# Patient Record
Sex: Female | Born: 1935 | Race: White | Hispanic: No | Smoking: Former smoker
Health system: Southern US, Community
[De-identification: ages and names within clinical notes are randomized; demographics above are authoritative.]

## PROBLEM LIST (undated history)

## (undated) DIAGNOSIS — I10 Essential (primary) hypertension: Secondary | ICD-10-CM

## (undated) DIAGNOSIS — K922 Gastrointestinal hemorrhage, unspecified: Secondary | ICD-10-CM

## (undated) DIAGNOSIS — I350 Nonrheumatic aortic (valve) stenosis: Secondary | ICD-10-CM

## (undated) DIAGNOSIS — I340 Nonrheumatic mitral (valve) insufficiency: Secondary | ICD-10-CM

## (undated) DIAGNOSIS — E041 Nontoxic single thyroid nodule: Secondary | ICD-10-CM

## (undated) DIAGNOSIS — M1990 Unspecified osteoarthritis, unspecified site: Secondary | ICD-10-CM

## (undated) DIAGNOSIS — G43909 Migraine, unspecified, not intractable, without status migrainosus: Secondary | ICD-10-CM

## (undated) HISTORY — DX: Migraine, unspecified, not intractable, without status migrainosus: G43.909

## (undated) HISTORY — DX: Gastrointestinal hemorrhage, unspecified: K92.2

## (undated) HISTORY — DX: Essential (primary) hypertension: I10

## (undated) HISTORY — PX: THUMB ARTHROSCOPY: SHX2509

## (undated) HISTORY — DX: Unspecified osteoarthritis, unspecified site: M19.90

## (undated) HISTORY — DX: Nonrheumatic mitral (valve) insufficiency: I34.0

## (undated) HISTORY — DX: Nonrheumatic aortic (valve) stenosis: I35.0

## (undated) HISTORY — PX: TONSILLECTOMY: SUR1361

## (undated) HISTORY — DX: Nontoxic single thyroid nodule: E04.1

---

## 1988-01-16 HISTORY — PX: CATARACT EXTRACTION: SUR2

## 1999-11-07 ENCOUNTER — Encounter: Admission: RE | Admit: 1999-11-07 | Discharge: 1999-11-07 | Payer: Self-pay | Admitting: Endocrinology

## 1999-11-07 ENCOUNTER — Encounter: Payer: Self-pay | Admitting: Endocrinology

## 2001-05-07 ENCOUNTER — Encounter: Admission: RE | Admit: 2001-05-07 | Discharge: 2001-05-07 | Payer: Self-pay | Admitting: Endocrinology

## 2001-05-07 ENCOUNTER — Encounter: Payer: Self-pay | Admitting: Endocrinology

## 2002-04-15 ENCOUNTER — Encounter: Payer: Self-pay | Admitting: Endocrinology

## 2002-04-15 ENCOUNTER — Encounter: Admission: RE | Admit: 2002-04-15 | Discharge: 2002-04-15 | Payer: Self-pay | Admitting: Endocrinology

## 2002-06-15 ENCOUNTER — Encounter: Payer: Self-pay | Admitting: Endocrinology

## 2002-06-15 ENCOUNTER — Encounter: Admission: RE | Admit: 2002-06-15 | Discharge: 2002-06-15 | Payer: Self-pay | Admitting: Endocrinology

## 2003-01-16 HISTORY — PX: CATARACT EXTRACTION: SUR2

## 2003-06-22 ENCOUNTER — Ambulatory Visit (HOSPITAL_COMMUNITY): Admission: RE | Admit: 2003-06-22 | Discharge: 2003-06-22 | Payer: Self-pay | Admitting: Endocrinology

## 2004-06-22 ENCOUNTER — Ambulatory Visit (HOSPITAL_COMMUNITY): Admission: RE | Admit: 2004-06-22 | Discharge: 2004-06-22 | Payer: Self-pay | Admitting: Endocrinology

## 2005-06-27 ENCOUNTER — Ambulatory Visit (HOSPITAL_COMMUNITY): Admission: RE | Admit: 2005-06-27 | Discharge: 2005-06-27 | Payer: Self-pay | Admitting: Endocrinology

## 2006-01-15 HISTORY — PX: REVISION TOTAL KNEE ARTHROPLASTY: SHX767

## 2006-07-02 ENCOUNTER — Ambulatory Visit (HOSPITAL_COMMUNITY): Admission: RE | Admit: 2006-07-02 | Discharge: 2006-07-02 | Payer: Self-pay | Admitting: Endocrinology

## 2007-07-03 ENCOUNTER — Ambulatory Visit (HOSPITAL_COMMUNITY): Admission: RE | Admit: 2007-07-03 | Discharge: 2007-07-03 | Payer: Self-pay | Admitting: Endocrinology

## 2008-07-06 ENCOUNTER — Ambulatory Visit (HOSPITAL_COMMUNITY): Admission: RE | Admit: 2008-07-06 | Discharge: 2008-07-06 | Payer: Self-pay | Admitting: Endocrinology

## 2009-07-19 ENCOUNTER — Ambulatory Visit (HOSPITAL_COMMUNITY): Admission: RE | Admit: 2009-07-19 | Discharge: 2009-07-19 | Payer: Self-pay | Admitting: Endocrinology

## 2010-07-13 ENCOUNTER — Other Ambulatory Visit: Payer: Self-pay | Admitting: Internal Medicine

## 2010-07-13 DIAGNOSIS — Z1231 Encounter for screening mammogram for malignant neoplasm of breast: Secondary | ICD-10-CM

## 2010-07-27 ENCOUNTER — Ambulatory Visit (HOSPITAL_COMMUNITY)
Admission: RE | Admit: 2010-07-27 | Discharge: 2010-07-27 | Disposition: A | Discharge status: Home / Self Care | Payer: Medicare Other | Source: Ambulatory Visit | Attending: Internal Medicine | Admitting: Internal Medicine

## 2010-07-27 DIAGNOSIS — Z1231 Encounter for screening mammogram for malignant neoplasm of breast: Secondary | ICD-10-CM | POA: Insufficient documentation

## 2010-08-01 ENCOUNTER — Other Ambulatory Visit: Payer: Self-pay | Admitting: Internal Medicine

## 2010-08-01 DIAGNOSIS — R928 Other abnormal and inconclusive findings on diagnostic imaging of breast: Secondary | ICD-10-CM

## 2010-08-02 ENCOUNTER — Ambulatory Visit
Admission: RE | Admit: 2010-08-02 | Discharge: 2010-08-02 | Disposition: A | Discharge status: Home / Self Care | Payer: Medicare Other | Source: Ambulatory Visit | Attending: Internal Medicine | Admitting: Internal Medicine

## 2010-08-02 DIAGNOSIS — R928 Other abnormal and inconclusive findings on diagnostic imaging of breast: Secondary | ICD-10-CM

## 2011-06-27 ENCOUNTER — Other Ambulatory Visit: Payer: Self-pay | Admitting: Internal Medicine

## 2011-06-27 DIAGNOSIS — Z1231 Encounter for screening mammogram for malignant neoplasm of breast: Secondary | ICD-10-CM

## 2011-07-30 ENCOUNTER — Ambulatory Visit (HOSPITAL_COMMUNITY)
Admission: RE | Admit: 2011-07-30 | Discharge: 2011-07-30 | Disposition: A | Discharge status: Home / Self Care | Payer: Medicare Other | Source: Ambulatory Visit | Attending: Internal Medicine | Admitting: Internal Medicine

## 2011-07-30 DIAGNOSIS — Z1231 Encounter for screening mammogram for malignant neoplasm of breast: Secondary | ICD-10-CM

## 2012-01-28 ENCOUNTER — Other Ambulatory Visit: Payer: Self-pay | Admitting: Internal Medicine

## 2012-01-28 ENCOUNTER — Ambulatory Visit
Admission: RE | Admit: 2012-01-28 | Discharge: 2012-01-28 | Disposition: A | Discharge status: Home / Self Care | Payer: Medicare PPO | Source: Ambulatory Visit | Attending: Internal Medicine | Admitting: Internal Medicine

## 2012-01-28 DIAGNOSIS — R059 Cough, unspecified: Secondary | ICD-10-CM

## 2012-01-28 DIAGNOSIS — R05 Cough: Secondary | ICD-10-CM

## 2012-01-28 IMAGING — CR DG CHEST 2V
2 series · 2 of 2 positions shown · non-contrast
Comparison: None.

CLINICAL DATA: Cough for 6 months

CHEST - 2 VIEW

[view not recorded (1 of 2)]
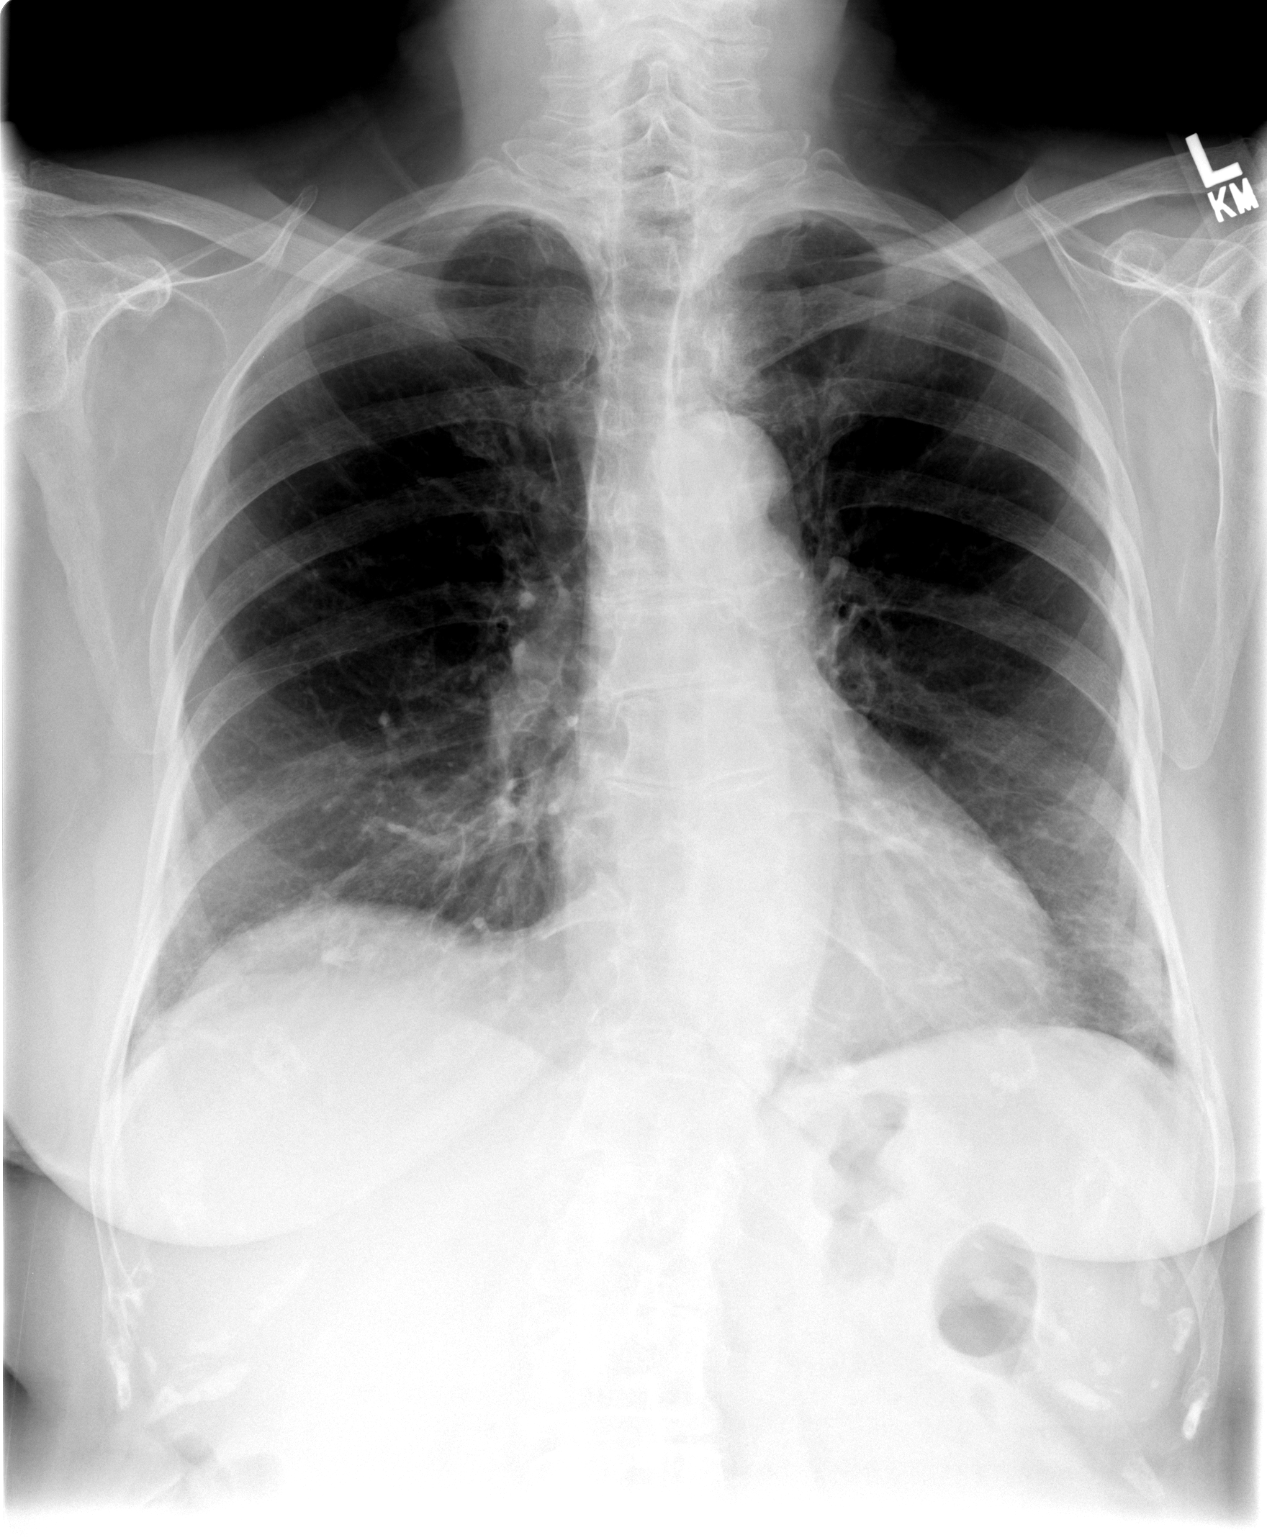

[view not recorded (2 of 2)]
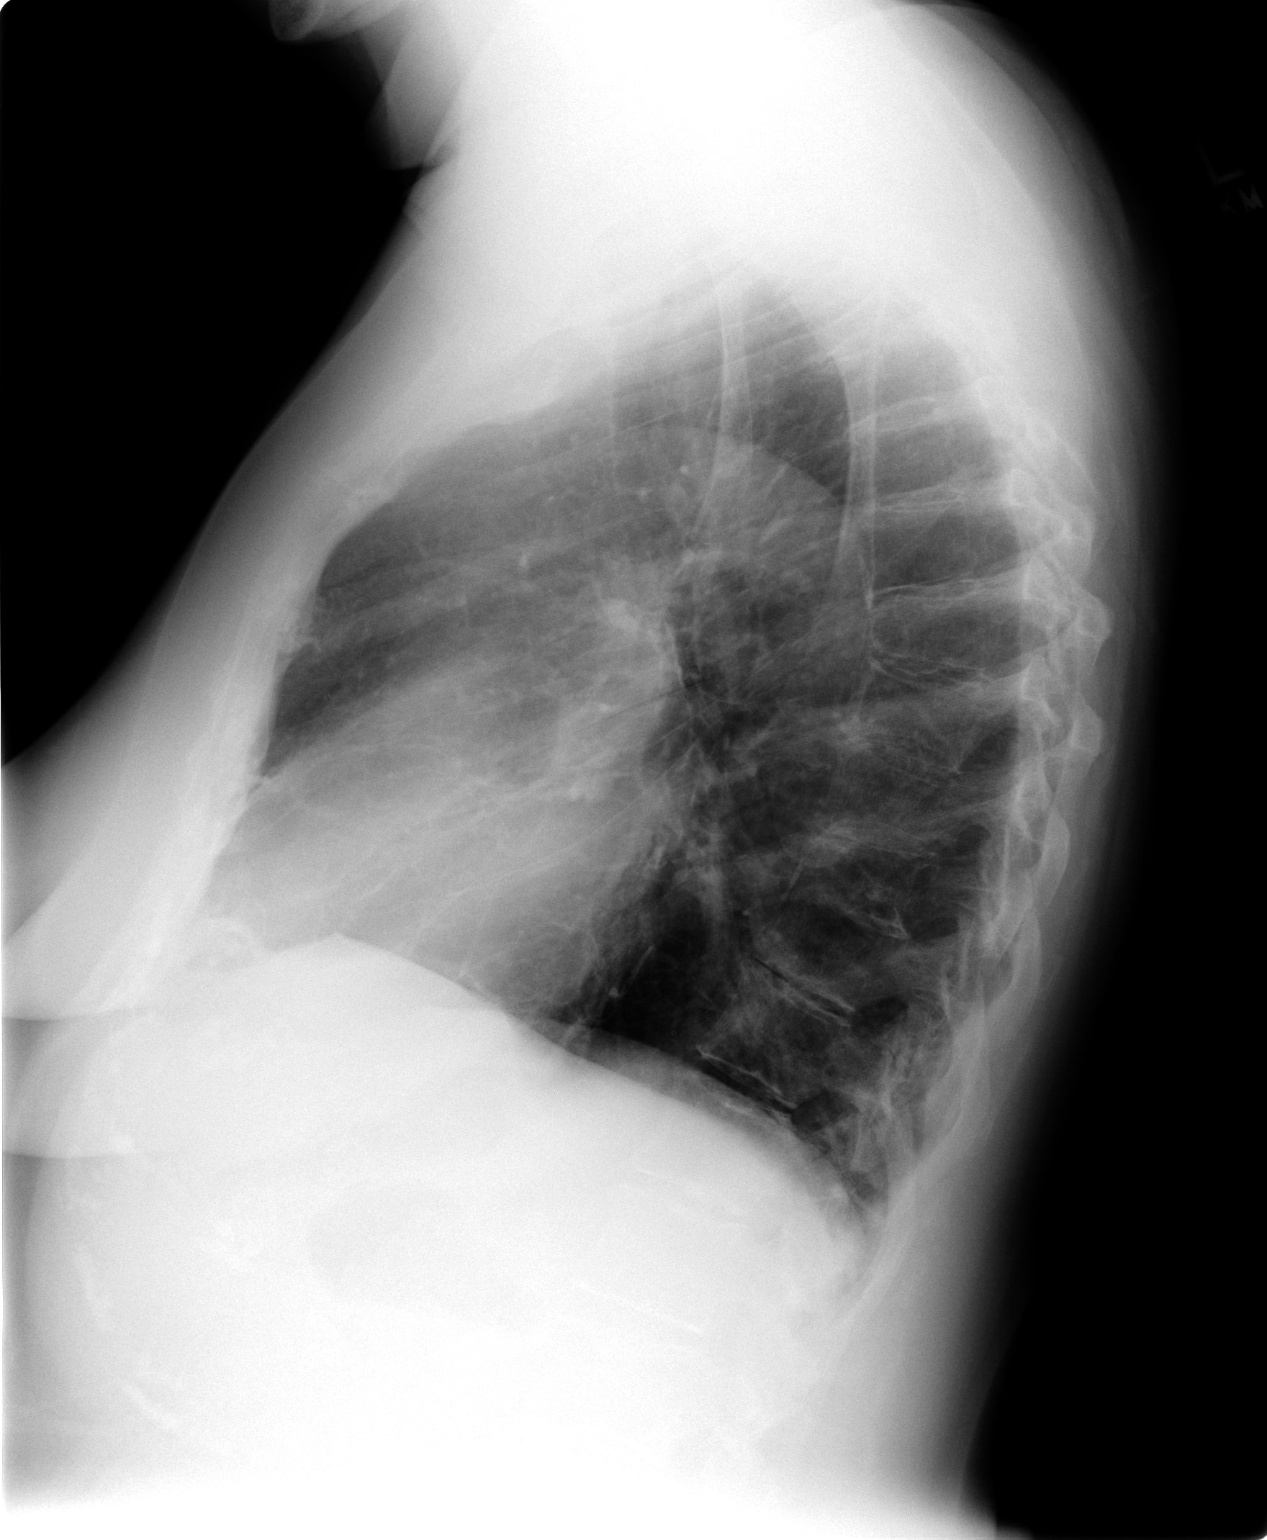

[2 of 2 positions shown; findings below may reference images not displayed]

FINDINGS: There appears be mild basilar fibrosis present, but no
focal infiltrate or effusion is seen.  Mediastinal contours are
normal.  The heart is within upper limits of normal for age.  No
acute bony abnormality is seen.
IMPRESSION: No active lung disease.  Probable mild basilar fibrosis left
greater than right.

## 2012-01-30 ENCOUNTER — Encounter: Payer: Self-pay | Admitting: Internal Medicine

## 2012-01-31 ENCOUNTER — Ambulatory Visit (INDEPENDENT_AMBULATORY_CARE_PROVIDER_SITE_OTHER): Payer: Medicare PPO | Admitting: Internal Medicine

## 2012-01-31 ENCOUNTER — Encounter: Payer: Self-pay | Admitting: Internal Medicine

## 2012-01-31 VITALS — BP 144/70 | HR 85 | Temp 98.0°F | Ht 62.5 in | Wt 171.8 lb

## 2012-01-31 DIAGNOSIS — R05 Cough: Secondary | ICD-10-CM

## 2012-01-31 DIAGNOSIS — R918 Other nonspecific abnormal finding of lung field: Secondary | ICD-10-CM

## 2012-01-31 DIAGNOSIS — R059 Cough, unspecified: Secondary | ICD-10-CM | POA: Insufficient documentation

## 2012-01-31 DIAGNOSIS — R9389 Abnormal findings on diagnostic imaging of other specified body structures: Secondary | ICD-10-CM

## 2012-01-31 DIAGNOSIS — I10 Essential (primary) hypertension: Secondary | ICD-10-CM

## 2012-01-31 MED ORDER — LOSARTAN POTASSIUM 50 MG PO TABS: 50.0000 mg | ORAL_TABLET | Freq: Every day | ORAL | Status: DC

## 2012-01-31 NOTE — Assessment & Plan Note (Signed)
ACE inhibitors are problematic in  pts with airway complaints because  even experienced pulmonologists can't always distinguish ace effects from copd/asthma/pnds/ allergies etc.  By themselves they don't actually cause a problem, much like oxygen can't by itself start a fire, but they certainly serve as a powerful catalyst or enhancer for any "fire"  or inflammatory process in the upper airway, be it caused by an ET  tube or more commonly reflux (especially in the obese or pts with known GERD or who are on biphoshonates) or URI's, due to interference with bradykinin clearance.  The effects of acei on bradykinin levels occurs in 100% of pt's on acei (unless they surreptitiously stop the med!) but the classic cough is only reported in 5%.  This leaves 95% of pts on acei's  with a variety of syndromes including no identifiable symptom in most  vs non-specific symptoms that wax and wane depending on what other insult is occuring at the level of the upper airway (like pnds which may be the "spark" here)  Try on cozaar 50 mg daily

## 2012-01-31 NOTE — Progress Notes (Signed)
  Subjective:    Patient ID: Donna [name], female    DOB: August 16, 1927  MRN: [id number]  HPI  53 yowf quit smoking 1950 and no trouble x " sinus drainage" spring and fall  Then cough onset summer 2012 and referred 01/31/2012 to pulmonary clinic by Bufford Spikes for cough and abn cxr    01/31/2012 1st pulmonary eval/ Wert on ACEI cc cough daily gradually worse x 16 months esp when lie down and when changes positions, always dry. No sob at all. No better on zyrtec. No real variation of cough with spring and fall. No itching sneezing or wheezing.   No early am exacerbation  of respiratory  c/o's or need for noct saba. Also denies any obvious fluctuation of symptoms with weather or environmental changes or other aggravating or alleviating factors except as outlined above   Review of Systems  Constitutional: Negative for fever, chills and unexpected weight change.  HENT: Positive for rhinorrhea. Negative for ear pain, nosebleeds, congestion, sore throat, sneezing, trouble swallowing, dental problem, voice change, postnasal drip and sinus pressure.   Eyes: Negative for visual disturbance.  Respiratory: Positive for cough. Negative for choking and shortness of breath.   Cardiovascular: Negative for chest pain and leg swelling.  Gastrointestinal: Negative for vomiting, abdominal pain and diarrhea.  Genitourinary: Negative for difficulty urinating.  Musculoskeletal: Negative for arthralgias.  Skin: Negative for rash.  Neurological: Negative for tremors, syncope and headaches.  Hematological: Does not bruise/bleed easily.       Objective:   Physical Exam  Pleasant amb wf  Wt Readings from Last 3 Encounters:  01/31/12 171 lb 12.8 oz (77.928 kg)    HEENT: nl dentition, turbinates, and orophanx. Nl external ear canals without cough reflex   NECK :  without JVD/Nodes/TM/ nl carotid upstrokes bilaterally   LUNGS: no acc muscle use, clear to A and P bilaterally without cough on insp or exp  maneuvers   CV:  RRR  no s3 or murmur or increase in P2, no edema   ABD:  soft and nontender with nl excursion in the supine position. No bruits or organomegaly, bowel sounds nl  MS:  warm without deformities, calf tenderness, cyanosis or clubbing  SKIN: warm and dry without lesions    NEURO:  alert, approp, no deficits    cxr 01/28/12 No active lung disease. Probable mild basilar fibrosis left  greater than right       Assessment & Plan:

## 2012-01-31 NOTE — Assessment & Plan Note (Signed)
The most common causes of chronic cough in immunocompetent adults include the following: upper airway cough syndrome (UACS), previously referred to as postnasal drip syndrome (PNDS), which is caused by variety of rhinosinus conditions; (2) asthma; (3) GERD; (4) chronic bronchitis from cigarette smoking or other inhaled environmental irritants; (5) nonasthmatic eosinophilic bronchitis; and (6) bronchiectasis.   These conditions, singly or in combination, have accounted for up to 94% of the causes of chronic cough in prospective studies.   Other conditions have constituted no >6% of the causes in prospective studies These have included bronchogenic carcinoma, chronic interstitial pneumonia, sarcoidosis, left ventricular failure, ACEI-induced cough, and aspiration from a condition associated with pharyngeal dysfunction.    Chronic cough is often simultaneously caused by more than one condition. A single cause has been found from 38 to 82% of the time, multiple causes from 18 to 62%. Multiply caused cough has been the result of three diseases up to 42% of the time.       Most likely this is classic  Classic Upper airway cough syndrome, so named because it's frequently impossible to sort out how much is  CR/sinusitis with freq throat clearing (which can be related to primary GERD)   vs  causing  secondary (" extra esophageal")  GERD from wide swings in gastric pressure that occur with throat clearing, often  promoting self use of mint and menthol lozenges that reduce the lower esophageal sphincter tone and exacerbate the problem further in a cyclical fashion.   These are the same pts (now being labeled as having "irritable larynx syndrome" by some cough centers) who not infrequently have a history of having failed to tolerate ace inhibitors,  dry powder inhalers or biphosphonates or report having atypical reflux symptoms that don't respond to standard doses of PPI , and are easily confused as having aecopd or  asthma flares by even experienced allergists/ pulmonologists.   For now try off acei, rx with h1 and even h2 at hs and then regroup in 4 weeks

## 2012-01-31 NOTE — Assessment & Plan Note (Addendum)
cxr reviewed and in the absence of convincing symptoms of ILD ( cough on isp/ limiting sob, which are lacking here) would not pursue any further w/u at this point - at age 79 unlikely to ever impact health even if she does have very mild PF

## 2012-01-31 NOTE — Patient Instructions (Addendum)
Stop lisinopril and replace with cozaar 50 mg one daily  Try chlortrimeton 4mg   at bedtime along with Pepcid 20 mg  if not satisfied with Zyrtec to eliminate the drainage   Avoid  late meals, excessive alcohol, smoking cessation, and avoid fatty foods, chocolate, peppermint, colas, red wine, and acidic juices such as orange juice.  NO MINT OR MENTHOL PRODUCTS SO NO COUGH DROPS  USE SUGARLESS CANDY INSTEAD (jolley ranchers or Stover's)  NO OIL BASED VITAMINS - use powdered substitutes.     If you are satisfied with your treatment plan let your doctor know and he/she can either refill your medications or you can return here when your prescription runs out.     If in any way you are not 100% satisfied,  please tell us.  If 100% better, tell your friends!

## 2012-05-22 ENCOUNTER — Other Ambulatory Visit: Payer: Self-pay | Admitting: *Deleted

## 2012-05-22 DIAGNOSIS — I10 Essential (primary) hypertension: Secondary | ICD-10-CM

## 2012-05-22 DIAGNOSIS — Z0000 Encounter for general adult medical examination without abnormal findings: Secondary | ICD-10-CM

## 2012-05-22 DIAGNOSIS — M1990 Unspecified osteoarthritis, unspecified site: Secondary | ICD-10-CM

## 2012-05-22 DIAGNOSIS — E041 Nontoxic single thyroid nodule: Secondary | ICD-10-CM

## 2012-06-25 ENCOUNTER — Other Ambulatory Visit: Payer: Self-pay | Admitting: Internal Medicine

## 2012-06-25 DIAGNOSIS — Z1231 Encounter for screening mammogram for malignant neoplasm of breast: Secondary | ICD-10-CM

## 2012-07-24 ENCOUNTER — Other Ambulatory Visit: Payer: 59

## 2012-07-24 DIAGNOSIS — M1990 Unspecified osteoarthritis, unspecified site: Secondary | ICD-10-CM

## 2012-07-24 DIAGNOSIS — E041 Nontoxic single thyroid nodule: Secondary | ICD-10-CM

## 2012-07-24 DIAGNOSIS — Z0000 Encounter for general adult medical examination without abnormal findings: Secondary | ICD-10-CM

## 2012-07-24 DIAGNOSIS — I10 Essential (primary) hypertension: Secondary | ICD-10-CM

## 2012-07-25 ENCOUNTER — Encounter: Payer: Self-pay | Admitting: *Deleted

## 2012-07-25 LAB — CBC WITH DIFFERENTIAL/PLATELET
Basophils Absolute: 0 10*3/uL (ref 0.0–0.2)
Basos: 0 % (ref 0–3)
Eos: 4 % (ref 0–5)
Eosinophils Absolute: 0.2 10*3/uL (ref 0.0–0.4)
HCT: 41.5 % (ref 34.0–46.6)
Hemoglobin: 14.6 g/dL (ref 11.1–15.9)
Immature Grans (Abs): 0 10*3/uL (ref 0.0–0.1)
Immature Granulocytes: 0 % (ref 0–2)
Lymphocytes Absolute: 1.6 10*3/uL (ref 0.7–3.1)
Lymphs: 30 % (ref 14–46)
MCH: 30.7 pg (ref 26.6–33.0)
MCHC: 35.2 g/dL (ref 31.5–35.7)
MCV: 87 fL (ref 79–97)
Monocytes Absolute: 0.5 10*3/uL (ref 0.1–0.9)
Monocytes: 9 % (ref 4–12)
Neutrophils Absolute: 2.9 10*3/uL (ref 1.4–7.0)
Neutrophils Relative %: 57 % (ref 40–74)
RBC: 4.75 x10E6/uL (ref 3.77–5.28)
RDW: 13.4 % (ref 12.3–15.4)
WBC: 5.1 10*3/uL (ref 3.4–10.8)

## 2012-07-25 LAB — BASIC METABOLIC PANEL
BUN/Creatinine Ratio: 15 (ref 11–26)
BUN: 12 mg/dL (ref 8–27)
CO2: 24 mmol/L (ref 18–29)
Calcium: 9.8 mg/dL (ref 8.6–10.2)
Chloride: 102 mmol/L (ref 97–108)
Creatinine, Ser: 0.8 mg/dL (ref 0.57–1.00)
GFR calc Af Amer: 79 mL/min/{1.73_m2} (ref >59)
GFR calc non Af Amer: 69 mL/min/{1.73_m2} (ref >59)
Glucose: 99 mg/dL (ref 65–99)
Potassium: 4.8 mmol/L (ref 3.5–5.2)
Sodium: 139 mmol/L (ref 134–144)

## 2012-07-25 LAB — TSH: TSH: 3.4 u[IU]/mL (ref 0.450–4.500)

## 2012-07-28 ENCOUNTER — Encounter: Payer: Self-pay | Admitting: Internal Medicine

## 2012-07-28 ENCOUNTER — Ambulatory Visit (INDEPENDENT_AMBULATORY_CARE_PROVIDER_SITE_OTHER): Payer: 59 | Admitting: Internal Medicine

## 2012-07-28 VITALS — BP 150/68 | HR 67 | Temp 97.9°F | Resp 13 | Ht 62.5 in | Wt 175.6 lb

## 2012-07-28 DIAGNOSIS — R61 Generalized hyperhidrosis: Secondary | ICD-10-CM

## 2012-07-28 DIAGNOSIS — L74519 Primary focal hyperhidrosis, unspecified: Secondary | ICD-10-CM

## 2012-07-28 NOTE — Progress Notes (Signed)
Patient ID: Donna [name], female   DOB: [birth year], 79 y.o.   MRN: [id number] Location:  [facility]   No Known Allergies  Chief Complaint  Patient presents with  . Medical Managment of Chronic Issues    HPI: Patient is a 79 y.o. white female seen in the office today for med mgt chronic diseases Labs:  Cbc, bmp, tsh all were normal.  Still has hot flashes at 79.  Is warm all of the time.  Gets intense burning in arms and legs from knees down.  It goes away, then she breaks out in sweats all over.  Burning thing is new probably since winter sometime.    Review of Systems:  Review of Systems  Constitutional: Negative for fever, chills, weight loss, malaise/fatigue and diaphoresis.  Eyes: Negative for blurred vision.  Respiratory: Negative for cough and shortness of breath.   Cardiovascular: Negative for chest pain.  Gastrointestinal: Negative for abdominal pain.  Genitourinary: Negative for dysuria.  Musculoskeletal: Negative for myalgias.  Skin: Negative for rash.  Neurological: Positive for tingling and sensory change. Negative for weakness and headaches.  Endo/Heme/Allergies: Does not bruise/bleed easily.       Hot flashes, burning  Psychiatric/Behavioral: Negative for depression and memory loss.     Past Medical History  Diagnosis Date  . Thyroid cyst   . Migraine   . OA (osteoarthritis)   . Hypertension   . Hemorrhage of gastrointestinal tract, unspecified     Past Surgical History  Procedure Laterality Date  . Cataract extraction  1990    rt  . Cataract extraction  2005    left  . Tonsillectomy    . Revision total knee arthroplasty  2008  . Thumb arthroscopy      Social History:   reports that she quit smoking about 64 years ago. Her smoking use included Cigarettes. She smoked 0.00 packs per day for 2 years. She does not have any smokeless tobacco history on file. She reports that  drinks alcohol. She reports that she does  not use illicit drugs.  Family History  Problem Relation Age of Onset  . CVA Father 58  . Pancreatic cancer Mother   . CVA Sister 58  . Brain cancer Brother   . Arthritis Sister     Medications: Patient's Medications  New Prescriptions   No medications on file  Previous Medications   CALCIUM CARBONATE-VITAMIN D 600-400 MG-UNIT PER TABLET    Take 2 tablets by mouth daily.   CELECOXIB (CELEBREX) 200 MG CAPSULE    Take 200 mg by mouth daily as needed.   CETIRIZINE (ZYRTEC) 10 MG TABLET    Take 10 mg by mouth daily.   LEVOTHYROXINE (SYNTHROID, LEVOTHROID) 100 MCG TABLET    Take 100 mcg by mouth daily.   LOSARTAN (COZAAR) 50 MG TABLET    Take 1 tablet (50 mg total) by mouth daily.   MULTIPLE VITAMINS-MINERALS (ICAPS) CAPS    Take 2 capsules by mouth daily.  Modified Medications   No medications on file  Discontinued Medications   No medications on file   Physical Exam: Filed Vitals:   07/28/12 0947  BP: 150/68  Pulse: 67  Temp: 97.9 F (36.6 C)  TempSrc: Oral  Resp: 13  Height: 5' 2.5" (1.588 m)  Weight: 175 lb 9.6 oz (79.652 kg)  Physical Exam  Constitutional: She is oriented to person, place, and time. She appears well-developed and well-nourished. No distress.  Cardiovascular:  Normal rate, regular rhythm, normal heart sounds and intact distal pulses.   Pulmonary/Chest: Effort normal and breath sounds normal. No respiratory distress.  Abdominal: Soft. Bowel sounds are normal. She exhibits no distension. There is no tenderness.  Musculoskeletal: She exhibits no edema and no tenderness.  Neurological: She is alert and oriented to person, place, and time.  No loss of sensation to light touch or proprioception, normal temp sense  Skin: Skin is warm and dry. No rash noted.  Psychiatric: She has a normal mood and affect.    Labs reviewed: Basic Metabolic Panel:  Recent Labs  40/98/11 0825  NA 139  K 4.8  CL 102  CO2 24  GLUCOSE 99  BUN 12  CREATININE 0.80   CALCIUM 9.8  TSH 3.400  CBC:  Recent Labs  07/24/12 0825  WBC 5.1  NEUTROABS 2.9  HGB 14.6  HCT 41.5  MCV 87  Assessment/Plan 1. Hyperhydrosis disorder - Metanephrines, urine, 24 hour; Future - Catecholamines, fractionated, urine, 24 hour; Future - 5 HIAA, quantitative, urine, 24 hour; Future  Labs/tests ordered:as above Next appt:  prn

## 2012-07-31 ENCOUNTER — Ambulatory Visit (HOSPITAL_COMMUNITY)
Admission: RE | Admit: 2012-07-31 | Discharge: 2012-07-31 | Disposition: A | Discharge status: Home / Self Care | Payer: Medicare PPO | Source: Ambulatory Visit | Attending: Internal Medicine | Admitting: Internal Medicine

## 2012-07-31 ENCOUNTER — Other Ambulatory Visit: Payer: 59

## 2012-07-31 DIAGNOSIS — Z1231 Encounter for screening mammogram for malignant neoplasm of breast: Secondary | ICD-10-CM

## 2012-07-31 DIAGNOSIS — R61 Generalized hyperhidrosis: Secondary | ICD-10-CM

## 2012-08-02 LAB — CATECHOLAMINES, FRACTIONATED, URINE, 24 HOUR
Dopamine , 24H Ur: 125 ug/24 hr (ref 0–510)
Dopamine, Rand Ur: 109 ug/L
Epinephrine, 24H Ur: 1 ug/24 hr (ref 0–20)
Epinephrine, Rand Ur: 1 ug/L
Norepinephrine, 24H Ur: 43 ug/24 hr (ref 0–135)
Norepinephrine, Rand Ur: 37 ug/L

## 2012-08-02 LAB — METANEPHRINES, URINE, 24 HOUR
Metaneph Total, Ur: 32 ug/L
Metanephrines, 24H Ur: 37 ug/24 hr — ABNORMAL LOW (ref 45–290)
Normetanephrine, 24H Ur: 322 ug/24 hr (ref 82–500)
Normetanephrine, Ur: 280 ug/L

## 2012-08-04 LAB — 5 HIAA, QUANTITATIVE, URINE, 24 HOUR
5-HIAA, Ur: 1.9 mg/L
5-HIAA,Quant.,24 Hr Urine: 4.5 mg/24 hr (ref 0.0–14.9)

## 2013-02-09 ENCOUNTER — Other Ambulatory Visit: Payer: Self-pay | Admitting: Internal Medicine

## 2013-02-12 ENCOUNTER — Other Ambulatory Visit: Payer: Self-pay | Admitting: *Deleted

## 2013-02-12 MED ORDER — LOSARTAN POTASSIUM 50 MG PO TABS: 50.0000 mg | ORAL_TABLET | Freq: Every day | ORAL | Status: DC

## 2013-04-16 ENCOUNTER — Other Ambulatory Visit: Payer: Self-pay | Admitting: *Deleted

## 2013-04-16 MED ORDER — LOSARTAN POTASSIUM 50 MG PO TABS: 50.0000 mg | ORAL_TABLET | Freq: Every day | ORAL | Status: DC

## 2013-04-16 NOTE — Telephone Encounter (Signed)
[facility] 

## 2013-05-07 ENCOUNTER — Encounter: Payer: Self-pay | Admitting: Internal Medicine

## 2013-05-07 ENCOUNTER — Ambulatory Visit (INDEPENDENT_AMBULATORY_CARE_PROVIDER_SITE_OTHER): Payer: 59 | Admitting: Internal Medicine

## 2013-05-07 VITALS — BP 126/78 | HR 69 | Resp 10 | Wt 172.6 lb

## 2013-05-07 DIAGNOSIS — M159 Polyosteoarthritis, unspecified: Secondary | ICD-10-CM | POA: Insufficient documentation

## 2013-05-07 DIAGNOSIS — E041 Nontoxic single thyroid nodule: Secondary | ICD-10-CM | POA: Insufficient documentation

## 2013-05-07 DIAGNOSIS — R61 Generalized hyperhidrosis: Secondary | ICD-10-CM

## 2013-05-07 DIAGNOSIS — Z13220 Encounter for screening for lipoid disorders: Secondary | ICD-10-CM

## 2013-05-07 DIAGNOSIS — E039 Hypothyroidism, unspecified: Secondary | ICD-10-CM | POA: Insufficient documentation

## 2013-05-07 DIAGNOSIS — L74519 Primary focal hyperhidrosis, unspecified: Secondary | ICD-10-CM

## 2013-05-07 DIAGNOSIS — I10 Essential (primary) hypertension: Secondary | ICD-10-CM | POA: Insufficient documentation

## 2013-05-07 DIAGNOSIS — M1990 Unspecified osteoarthritis, unspecified site: Secondary | ICD-10-CM

## 2013-05-07 MED ORDER — LOSARTAN POTASSIUM 50 MG PO TABS: 50.0000 mg | ORAL_TABLET | Freq: Every day | ORAL | Status: DC

## 2013-05-07 MED ORDER — LEVOTHYROXINE SODIUM 100 MCG PO TABS: 100.0000 ug | ORAL_TABLET | Freq: Every day | ORAL | Status: DC

## 2013-05-07 NOTE — Progress Notes (Signed)
Patient ID: Donna [name], female   DOB: 1927/06/11, 80 y.o.   MRN: [id number]   [facility]  [facility]  Code Status: has living will and hcpoa--advised to please bring a copy in July for physical  No Known Allergies  Chief Complaint  Patient presents with  . Medication Management    Refill medication     HPI: Patient is a 80 y.o. white female seen in the office today for medication refills.  Continues with episodes of diaphoresis--is the same.  Has no desire to see a specialist and labs have been normal for this.    No new concerns.  Not due for annual until July, but was asked to come for med refills.    Vision doing ok.  Has been seeing Dr. Digby--Dr. Allena KatzPatel now.      Stopped her celebrex b/c she felt her skin was getting worse.  Now uses rare ibuprofen b/c of her bleeding history.  Tylenol does no good for her.    Review of Systems:  Review of Systems  Constitutional: Positive for diaphoresis. Negative for fever and malaise/fatigue.  HENT: Negative for congestion.   Eyes: Negative for blurred vision.  Respiratory: Negative for shortness of breath.   Cardiovascular: Negative for chest pain and leg swelling.  Gastrointestinal: Negative for abdominal pain.  Genitourinary: Negative for dysuria, urgency and frequency.  Musculoskeletal: Positive for joint pain. Negative for falls and myalgias.  Skin: Negative for rash.  Neurological: Negative for loss of consciousness, weakness and headaches.       Has periods of sweating  Psychiatric/Behavioral: Negative for depression and memory loss.    Past Medical History  Diagnosis Date  . Thyroid cyst   . Migraine   . OA (osteoarthritis)   . Hypertension   . Hemorrhage of gastrointestinal tract, unspecified     Past Surgical History  Procedure Laterality Date  . Cataract extraction  1990    rt  . Cataract extraction  2005    left  . Tonsillectomy    . Revision total knee arthroplasty  2008   . Thumb arthroscopy      Social History:   reports that she quit smoking about 65 years ago. Her smoking use included Cigarettes. She smoked 0.00 packs per day for 2 years. She does not have any smokeless tobacco history on file. She reports that she drinks alcohol. She reports that she does not use illicit drugs.  Family History  Problem Relation Age of Onset  . CVA Father [phone]  . Pancreatic cancer Mother   . CVA Sister [phone]  . Brain cancer Brother   . Arthritis Sister     Medications: Patient's Medications  New Prescriptions   No medications on file  Previous Medications   CALCIUM CARBONATE-VITAMIN D 600-400 MG-UNIT PER TABLET    Take 2 tablets by mouth daily.   LEVOTHYROXINE (SYNTHROID, LEVOTHROID) 100 MCG TABLET    Take 100 mcg by mouth daily.   LOSARTAN (COZAAR) 50 MG TABLET    Take 1 tablet (50 mg total) by mouth daily.   MULTIPLE VITAMINS-MINERALS (PRESERVISION AREDS 2) CAPS    Take by mouth. 1 by mouth twice daily  Modified Medications   No medications on file  Discontinued Medications   CELECOXIB (CELEBREX) 200 MG CAPSULE    Take 200 mg by mouth daily as needed.   CETIRIZINE (ZYRTEC) 10 MG TABLET    Take 10 mg by mouth daily.   MULTIPLE VITAMINS-MINERALS (  ICAPS) CAPS    Take 2 capsules by mouth daily.   Physical Exam: Filed Vitals:   05/07/13 1012  BP: 126/78  Pulse: 69  Resp: 10  Weight: 172 lb 9.6 oz (78.291 kg)  SpO2: 95%  Physical Exam  Constitutional: She is oriented to person, place, and time. She appears well-developed and well-nourished. No distress.  Cardiovascular: Normal rate, regular rhythm, normal heart sounds and intact distal pulses.   Has varicose veins legs  Pulmonary/Chest: Effort normal and breath sounds normal. No respiratory distress.  Abdominal: Soft. Bowel sounds are normal. She exhibits no distension and no mass. There is no tenderness.  Musculoskeletal: Normal range of motion. She exhibits no edema.  Neurological: She is alert and oriented  to person, place, and time.  Skin: Skin is warm and dry. There is pallor.  Psychiatric: She has a normal mood and affect.    Labs reviewed: Basic Metabolic Panel:  Recent Labs  16/10/9605/10/14 0825  NA 139  K 4.8  CL 102  CO2 24  GLUCOSE 99  BUN 12  CREATININE 0.80  CALCIUM 9.8  TSH 3.400  CBC:  Recent Labs  07/24/12 0825  WBC 5.1  NEUTROABS 2.9  HGB 14.6  HCT 41.5  MCV 87    Assessment/Plan 1. Hyperhydrosis disorder -etiology unclear, labs previously done and endocrinology referral offered, but refused - CBC With differential/Platelet; Future - Comprehensive metabolic panel; Future  2. Cyst of thyroid - TSH; Future  3. Unspecified hypothyroidism - TSH; Future -cont synthroid  4. Osteoarthrosis, unspecified whether generalized or localized, unspecified site -stable, stopped her celebrex and using rare prn ibuprofen -f/u cmp before her annual exam  5. Essential hypertension, benign -bp at goal with losartan alone  6. Screening, lipid - f/u Lipid panel; Future  Labs/tests ordered:   Orders Placed This Encounter  Procedures  . CBC With differential/Platelet    Standing Status: Future     Number of Occurrences:      Standing Expiration Date: 01/06/2014  . Comprehensive metabolic panel    Standing Status: Future     Number of Occurrences:      Standing Expiration Date: 01/06/2014  . TSH    Standing Status: Future     Number of Occurrences:      Standing Expiration Date: 01/06/2014  . Lipid panel    Standing Status: Future     Number of Occurrences:      Standing Expiration Date: 01/06/2014    Next appt:  July for annual exam with labs

## 2013-05-07 NOTE — Patient Instructions (Signed)
Please bring us a copy of your living will and health care power of attorney for your records. 

## 2013-07-14 ENCOUNTER — Other Ambulatory Visit: Payer: Self-pay | Admitting: Internal Medicine

## 2013-07-14 DIAGNOSIS — Z1231 Encounter for screening mammogram for malignant neoplasm of breast: Secondary | ICD-10-CM

## 2013-08-03 ENCOUNTER — Other Ambulatory Visit: Payer: 59

## 2013-08-03 DIAGNOSIS — Z13220 Encounter for screening for lipoid disorders: Secondary | ICD-10-CM

## 2013-08-03 DIAGNOSIS — E039 Hypothyroidism, unspecified: Secondary | ICD-10-CM

## 2013-08-03 DIAGNOSIS — E041 Nontoxic single thyroid nodule: Secondary | ICD-10-CM

## 2013-08-03 DIAGNOSIS — R61 Generalized hyperhidrosis: Secondary | ICD-10-CM

## 2013-08-04 ENCOUNTER — Ambulatory Visit (HOSPITAL_COMMUNITY)
Admission: RE | Admit: 2013-08-04 | Discharge: 2013-08-04 | Disposition: A | Discharge status: Home / Self Care | Payer: Medicare PPO | Source: Ambulatory Visit | Attending: Internal Medicine | Admitting: Internal Medicine

## 2013-08-04 DIAGNOSIS — Z1231 Encounter for screening mammogram for malignant neoplasm of breast: Secondary | ICD-10-CM | POA: Insufficient documentation

## 2013-08-04 LAB — COMPREHENSIVE METABOLIC PANEL
ALT: 23 IU/L (ref 0–32)
AST: 14 IU/L (ref 0–40)
Albumin/Globulin Ratio: 2.6 — ABNORMAL HIGH (ref 1.1–2.5)
Albumin: 4.2 g/dL (ref 3.5–4.7)
Alkaline Phosphatase: 44 IU/L (ref 39–117)
BUN/Creatinine Ratio: 15 (ref 11–26)
BUN: 12 mg/dL (ref 8–27)
CO2: 22 mmol/L (ref 18–29)
Calcium: 9.8 mg/dL (ref 8.7–10.3)
Chloride: 103 mmol/L (ref 97–108)
Creatinine, Ser: 0.8 mg/dL (ref 0.57–1.00)
GFR calc Af Amer: 79 mL/min/{1.73_m2} (ref >59)
GFR calc non Af Amer: 68 mL/min/{1.73_m2} (ref >59)
Globulin, Total: 1.6 g/dL (ref 1.5–4.5)
Glucose: 99 mg/dL (ref 65–99)
Potassium: 4.7 mmol/L (ref 3.5–5.2)
Sodium: 139 mmol/L (ref 134–144)
Total Bilirubin: 0.4 mg/dL (ref 0.0–1.2)
Total Protein: 5.8 g/dL — ABNORMAL LOW (ref 6.0–8.5)

## 2013-08-04 LAB — CBC WITH DIFFERENTIAL
Basophils Absolute: 0 10*3/uL (ref 0.0–0.2)
Basos: 0 %
Eos: 4 %
Eosinophils Absolute: 0.2 10*3/uL (ref 0.0–0.4)
HCT: 42.2 % (ref 34.0–46.6)
Hemoglobin: 14.7 g/dL (ref 11.1–15.9)
Immature Grans (Abs): 0 10*3/uL (ref 0.0–0.1)
Immature Granulocytes: 0 %
Lymphocytes Absolute: 1.2 10*3/uL (ref 0.7–3.1)
Lymphs: 25 %
MCH: 30.9 pg (ref 26.6–33.0)
MCHC: 34.8 g/dL (ref 31.5–35.7)
MCV: 89 fL (ref 79–97)
Monocytes Absolute: 0.5 10*3/uL (ref 0.1–0.9)
Monocytes: 11 %
Neutrophils Absolute: 2.7 10*3/uL (ref 1.4–7.0)
Neutrophils Relative %: 60 %
Platelets: 180 10*3/uL (ref 150–379)
RBC: 4.75 x10E6/uL (ref 3.77–5.28)
RDW: 13.2 % (ref 12.3–15.4)
WBC: 4.6 10*3/uL (ref 3.4–10.8)

## 2013-08-04 LAB — LIPID PANEL
Chol/HDL Ratio: 3.7 ratio units (ref 0.0–4.4)
Cholesterol, Total: 123 mg/dL (ref 100–199)
HDL: 33 mg/dL — ABNORMAL LOW (ref >39)
LDL Calculated: 73 mg/dL (ref 0–99)
Triglycerides: 84 mg/dL (ref 0–149)
VLDL Cholesterol Cal: 17 mg/dL (ref 5–40)

## 2013-08-04 LAB — TSH: TSH: 2.55 u[IU]/mL (ref 0.450–4.500)

## 2013-08-05 ENCOUNTER — Encounter: Payer: Self-pay | Admitting: *Deleted

## 2013-08-06 ENCOUNTER — Ambulatory Visit (INDEPENDENT_AMBULATORY_CARE_PROVIDER_SITE_OTHER): Payer: 59 | Admitting: Internal Medicine

## 2013-08-06 ENCOUNTER — Encounter: Payer: Self-pay | Admitting: Internal Medicine

## 2013-08-06 VITALS — BP 142/80 | HR 69 | Temp 98.2°F | Resp 10 | Ht 62.5 in | Wt 175.2 lb

## 2013-08-06 DIAGNOSIS — I10 Essential (primary) hypertension: Secondary | ICD-10-CM

## 2013-08-06 DIAGNOSIS — E039 Hypothyroidism, unspecified: Secondary | ICD-10-CM

## 2013-08-06 DIAGNOSIS — Z23 Encounter for immunization: Secondary | ICD-10-CM

## 2013-08-06 DIAGNOSIS — R61 Generalized hyperhidrosis: Secondary | ICD-10-CM

## 2013-08-06 DIAGNOSIS — M1990 Unspecified osteoarthritis, unspecified site: Secondary | ICD-10-CM

## 2013-08-06 DIAGNOSIS — L74519 Primary focal hyperhidrosis, unspecified: Secondary | ICD-10-CM

## 2013-08-06 DIAGNOSIS — Z13220 Encounter for screening for lipoid disorders: Secondary | ICD-10-CM

## 2013-08-06 MED ORDER — TETANUS-DIPHTH-ACELL PERTUSSIS 5-2.5-18.5 LF-MCG/0.5 IM SUSP: 0.5000 mL | Freq: Once | INTRAMUSCULAR | Status: DC

## 2013-08-06 NOTE — Progress Notes (Signed)
Patient ID: Donna [name], female   DOB: 06-19-1927, 80 y.o.   MRN: [id number]   Location:  [facility] [place] [facility]  Code Status: has living will and hcpoa scanned into chart today  No Known Allergies  Chief Complaint  Patient presents with  . Annual Exam    Yearly check-up, no pap, discuss labs (copy printed)     HPI: Patient is a 80 y.o. white female seen in the office today for annual exam.  Has no concerns.  Continues with sweating episodes, hot flashes.    Her husband has dementia.  Remains quite functional--can't remember days of week etc.  Has been stable.    No difficulties with headaches lately.  Blood pressure at goal of <150/90.  No GI bleeding.  Says some arthritis aches and pains.  Has had some left hip discomfort that is improving--thinks she twisted something earlier in the week.  Review of Systems:  Review of Systems  Constitutional: Positive for diaphoresis. Negative for fever, chills and malaise/fatigue.  HENT: Negative for hearing loss.   Eyes: Negative for blurred vision.  Respiratory: Negative for shortness of breath.   Cardiovascular: Negative for chest pain and leg swelling.  Gastrointestinal: Negative for abdominal pain, constipation, blood in stool and melena.  Genitourinary: Negative for dysuria, urgency and frequency.  Musculoskeletal: Positive for joint pain. Negative for falls.  Skin: Negative for rash.  Neurological: Negative for dizziness, focal weakness, weakness and headaches.  Endo/Heme/Allergies: Does not bruise/bleed easily.  Psychiatric/Behavioral: Negative for depression and memory loss. The patient does not have insomnia.        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 Thyroid cyst   . Migraine   . OA (osteoarthritis)   . Hypertension   . Hemorrhage of gastrointestinal tract, unspecified     Past Surgical History  Procedure Laterality Date  . Cataract  extraction  1990    rt  . Cataract extraction  2005    left  . Tonsillectomy    . Revision total knee arthroplasty  2008  . Thumb arthroscopy      Social History:   reports that she quit smoking about 65 years ago. Her smoking use included Cigarettes. She smoked 0.00 packs per day for 2 years. She does not have any smokeless tobacco history on file. She reports that she drinks alcohol. She reports that she does not use illicit drugs.  Family History  Problem Relation Age of Onset  . CVA Father 48  . Pancreatic cancer Mother   . CVA Sister 60  . Brain cancer Brother   . Arthritis Sister     Medications: Patient's Medications  New Prescriptions   No medications on file  Previous Medications   CALCIUM CARBONATE-VITAMIN D 600-400 MG-UNIT PER TABLET    Take 2 tablets by mouth daily.   LEVOTHYROXINE (SYNTHROID, LEVOTHROID) 100 MCG TABLET    Take 1 tablet (100 mcg total) by mouth daily. hypothyroidism   LOSARTAN (COZAAR) 50 MG TABLET    Take 1 tablet (50 mg total) by mouth daily. hypertension   MULTIPLE VITAMINS-MINERALS (PRESERVISION AREDS 2) CAPS    Take by mouth. 1 by mouth twice daily  Modified Medications   Modified Medication Previous Medication   TDAP (BOOSTRIX) 5-2.5-18.5 LF-MCG/0.5 INJECTION Tdap (BOOSTRIX) 5-2.5-18.5 LF-MCG/0.5 injection      Inject 0.5 mLs into the muscle once.    Inject 0.5 mLs into the  muscle once.  Discontinued Medications   No medications on file     Physical Exam: Filed Vitals:   08/06/13 1340  BP: 142/80  Pulse: 69  Temp: 98.2 F (36.8 C)  TempSrc: Oral  Resp: 10  Height: 5' 2.5" (1.588 m)  Weight: 175 lb 3.2 oz (79.47 kg)  SpO2: 95%  Physical Exam  Constitutional: She is oriented to person, place, and time. She appears well-developed and well-nourished. No distress.  HENT:  Head: Normocephalic and atraumatic.  Right Ear: External ear normal.  Left Ear: External ear normal.  Nose: Nose normal.  Mouth/Throat: Oropharynx is clear and  moist. No oropharyngeal exudate.  Eyes: Conjunctivae and EOM are normal. Pupils are equal, round, and reactive to light.  Neck: Normal range of motion. Neck supple. No JVD present. No thyromegaly present.  Cardiovascular: Normal rate, regular rhythm, normal heart sounds and intact distal pulses.   Pulmonary/Chest: Effort normal and breath sounds normal. No respiratory distress. Right breast exhibits no inverted nipple, no mass, no nipple discharge, no skin change and no tenderness. Left breast exhibits no inverted nipple, no mass, no nipple discharge, no skin change and no tenderness.  Abdominal: Soft. Bowel sounds are normal. She exhibits no distension and no mass. There is no tenderness.  Musculoskeletal: Normal range of motion. She exhibits no edema or tenderness.  Lymphadenopathy:    She has no cervical adenopathy.  Neurological: She is alert and oriented to person, place, and time. She has normal reflexes.  Skin: Skin is warm and dry.  Psychiatric: She has a normal mood and affect. Her behavior is normal. Judgment and thought content normal.    Labs reviewed: Basic Metabolic Panel:  Recent Labs  16/10/9605/20/15 0935  NA 139  K 4.7  CL 103  CO2 22  GLUCOSE 99  BUN 12  CREATININE 0.80  CALCIUM 9.8  TSH 2.550   Liver Function Tests:  Recent Labs  08/03/13 0935  AST 14  ALT 23  ALKPHOS 44  BILITOT 0.4  PROT 5.8*   No results found for this basename: LIPASE, AMYLASE,  in the last 8760 hours No results found for this basename: AMMONIA,  in the last 8760 hours CBC:  Recent Labs  08/03/13 0935  WBC 4.6  NEUTROABS 2.7  HGB 14.7  HCT 42.2  MCV 89  PLT 180   Lipid Panel:  Recent Labs  08/03/13 0935  HDL 33*  LDLCALC 73  TRIG 84  CHOLHDL 3.7   Assessment/Plan 1. Essential hypertension, benign -bp goal less than 150/90 with age and no comorbid illnesses- Comprehensive metabolic panel; Future  2. Osteoarthrosis, unspecified whether generalized or localized,  unspecified site -minor arthritis pains, does not even take any medications for this -discussed tylenol if she does need something  3. Screening, lipid - not on meds, discussed healthy diet and exercise - Comprehensive metabolic panel; Future - Lipid panel; Future  4. Unspecified hypothyroidism -cont synthroid, f/u tsh  5. Hyperhydrosis disorder - unclear etiology of this, recommended she see endocrine as I was unable to find cause and it seems unusual for her to have hot flashes at 80 yo when she went through menopause many years ago - CBC With differential/Platelet; Future  6. Need for Tdap vaccination -Rx given to get this at pharmacy  Labs/tests ordered: Orders Placed This Encounter  Procedures  . CBC With differential/Platelet    Standing Status: Future     Number of Occurrences:      Standing Expiration Date: 02/07/2015  .  Comprehensive metabolic panel    Standing Status: Future     Number of Occurrences:      Standing Expiration Date: 02/07/2015  . Lipid panel    Standing Status: Future     Number of Occurrences:      Standing Expiration Date: 02/07/2015  . TSH    Standing Status: Future     Number of Occurrences:      Standing Expiration Date: 02/07/2015    Next appt:  1 year for annual exam with labs before

## 2013-08-06 NOTE — Progress Notes (Signed)
Passed clock drawing 

## 2014-03-22 DIAGNOSIS — H04123 Dry eye syndrome of bilateral lacrimal glands: Secondary | ICD-10-CM | POA: Diagnosis not present

## 2014-03-22 DIAGNOSIS — H3531 Nonexudative age-related macular degeneration: Secondary | ICD-10-CM | POA: Diagnosis not present

## 2014-05-05 DIAGNOSIS — H3531 Nonexudative age-related macular degeneration: Secondary | ICD-10-CM | POA: Diagnosis not present

## 2014-05-05 DIAGNOSIS — H43813 Vitreous degeneration, bilateral: Secondary | ICD-10-CM | POA: Diagnosis not present

## 2014-05-27 ENCOUNTER — Other Ambulatory Visit: Payer: Self-pay | Admitting: *Deleted

## 2014-05-27 DIAGNOSIS — I10 Essential (primary) hypertension: Secondary | ICD-10-CM

## 2014-05-27 DIAGNOSIS — E041 Nontoxic single thyroid nodule: Secondary | ICD-10-CM

## 2014-05-27 DIAGNOSIS — E038 Other specified hypothyroidism: Secondary | ICD-10-CM

## 2014-05-27 MED ORDER — LEVOTHYROXINE SODIUM 100 MCG PO TABS
100.0000 ug | ORAL_TABLET | Freq: Every day | ORAL | Status: DC
Start: 2014-05-27 — End: 2015-01-25

## 2014-05-27 MED ORDER — LOSARTAN POTASSIUM 50 MG PO TABS: 50.0000 mg | ORAL_TABLET | Freq: Every day | ORAL | Status: DC

## 2014-05-27 NOTE — Telephone Encounter (Signed)
Patient Requested to be faxed to pharmacy. 

## 2014-07-05 ENCOUNTER — Other Ambulatory Visit: Payer: Self-pay | Admitting: Internal Medicine

## 2014-07-05 DIAGNOSIS — Z1231 Encounter for screening mammogram for malignant neoplasm of breast: Secondary | ICD-10-CM

## 2014-08-04 ENCOUNTER — Other Ambulatory Visit: Payer: Self-pay

## 2014-08-04 ENCOUNTER — Other Ambulatory Visit: Payer: Medicare PPO

## 2014-08-04 DIAGNOSIS — E039 Hypothyroidism, unspecified: Secondary | ICD-10-CM

## 2014-08-04 DIAGNOSIS — R61 Generalized hyperhidrosis: Secondary | ICD-10-CM

## 2014-08-04 DIAGNOSIS — Z13220 Encounter for screening for lipoid disorders: Secondary | ICD-10-CM

## 2014-08-04 DIAGNOSIS — R350 Frequency of micturition: Secondary | ICD-10-CM

## 2014-08-04 DIAGNOSIS — L74519 Primary focal hyperhidrosis, unspecified: Secondary | ICD-10-CM | POA: Diagnosis not present

## 2014-08-04 DIAGNOSIS — I10 Essential (primary) hypertension: Secondary | ICD-10-CM | POA: Diagnosis not present

## 2014-08-05 LAB — COMPREHENSIVE METABOLIC PANEL
ALT: 20 IU/L (ref 0–32)
AST: 14 IU/L (ref 0–40)
Albumin/Globulin Ratio: 2.5 (ref 1.1–2.5)
Albumin: 4.3 g/dL (ref 3.5–4.7)
Alkaline Phosphatase: 47 IU/L (ref 39–117)
BUN/Creatinine Ratio: 21 (ref 11–26)
BUN: 15 mg/dL (ref 8–27)
Bilirubin Total: 0.4 mg/dL (ref 0.0–1.2)
CO2: 23 mmol/L (ref 18–29)
Calcium: 9.9 mg/dL (ref 8.7–10.3)
Chloride: 101 mmol/L (ref 97–108)
Creatinine, Ser: 0.72 mg/dL (ref 0.57–1.00)
GFR calc Af Amer: 89 mL/min/{1.73_m2} (ref >59)
GFR calc non Af Amer: 77 mL/min/{1.73_m2} (ref >59)
Globulin, Total: 1.7 g/dL (ref 1.5–4.5)
Glucose: 100 mg/dL — ABNORMAL HIGH (ref 65–99)
Potassium: 4.5 mmol/L (ref 3.5–5.2)
Sodium: 140 mmol/L (ref 134–144)
Total Protein: 6 g/dL (ref 6.0–8.5)

## 2014-08-05 LAB — URINALYSIS
Bilirubin, UA: NEGATIVE
Glucose, UA: NEGATIVE
Ketones, UA: NEGATIVE
Nitrite, UA: POSITIVE — AB
Specific Gravity, UA: 1.013 (ref 1.005–1.030)
Urobilinogen, Ur: 0.2 mg/dL (ref 0.2–1.0)
pH, UA: 6.5 (ref 5.0–7.5)

## 2014-08-05 LAB — CBC WITH DIFFERENTIAL
Basophils Absolute: 0 10*3/uL (ref 0.0–0.2)
Basos: 1 %
EOS (ABSOLUTE): 0.3 10*3/uL (ref 0.0–0.4)
Eos: 4 %
Hematocrit: 42.5 % (ref 34.0–46.6)
Hemoglobin: 14.5 g/dL (ref 11.1–15.9)
Immature Grans (Abs): 0 10*3/uL (ref 0.0–0.1)
Immature Granulocytes: 0 %
Lymphocytes Absolute: 1.2 10*3/uL (ref 0.7–3.1)
Lymphs: 19 %
MCH: 30 pg (ref 26.6–33.0)
MCHC: 34.1 g/dL (ref 31.5–35.7)
MCV: 88 fL (ref 79–97)
Monocytes Absolute: 0.6 10*3/uL (ref 0.1–0.9)
Monocytes: 9 %
Neutrophils Absolute: 4.3 10*3/uL (ref 1.4–7.0)
Neutrophils: 67 %
RBC: 4.83 x10E6/uL (ref 3.77–5.28)
RDW: 13.9 % (ref 12.3–15.4)
WBC: 6.3 10*3/uL (ref 3.4–10.8)

## 2014-08-05 LAB — LIPID PANEL
Chol/HDL Ratio: 3.5 ratio units (ref 0.0–4.4)
Cholesterol, Total: 118 mg/dL (ref 100–199)
HDL: 34 mg/dL — ABNORMAL LOW (ref >39)
LDL Calculated: 69 mg/dL (ref 0–99)
Triglycerides: 77 mg/dL (ref 0–149)
VLDL Cholesterol Cal: 15 mg/dL (ref 5–40)

## 2014-08-05 LAB — TSH: TSH: 2.68 u[IU]/mL (ref 0.450–4.500)

## 2014-08-06 ENCOUNTER — Ambulatory Visit (HOSPITAL_COMMUNITY)
Admission: RE | Admit: 2014-08-06 | Discharge: 2014-08-06 | Disposition: A | Discharge status: Home / Self Care | Payer: Medicare PPO | Source: Ambulatory Visit | Attending: Internal Medicine | Admitting: Internal Medicine

## 2014-08-06 ENCOUNTER — Ambulatory Visit (HOSPITAL_COMMUNITY): Payer: Medicare PPO

## 2014-08-06 DIAGNOSIS — Z1231 Encounter for screening mammogram for malignant neoplasm of breast: Secondary | ICD-10-CM | POA: Diagnosis not present

## 2014-08-06 LAB — URINE CULTURE

## 2014-08-10 ENCOUNTER — Other Ambulatory Visit: Payer: Self-pay | Admitting: *Deleted

## 2014-08-10 MED ORDER — CIPROFLOXACIN HCL 500 MG PO TABS: ORAL_TABLET | ORAL | Status: DC

## 2014-08-13 ENCOUNTER — Ambulatory Visit (INDEPENDENT_AMBULATORY_CARE_PROVIDER_SITE_OTHER): Payer: Medicare PPO | Admitting: Internal Medicine

## 2014-08-13 ENCOUNTER — Encounter: Payer: Self-pay | Admitting: Internal Medicine

## 2014-08-13 VITALS — BP 138/86 | HR 76 | Temp 97.6°F | Resp 16 | Ht 63.0 in | Wt 171.0 lb

## 2014-08-13 DIAGNOSIS — Z23 Encounter for immunization: Secondary | ICD-10-CM

## 2014-08-13 DIAGNOSIS — L74519 Primary focal hyperhidrosis, unspecified: Secondary | ICD-10-CM

## 2014-08-13 DIAGNOSIS — Z13220 Encounter for screening for lipoid disorders: Secondary | ICD-10-CM

## 2014-08-13 DIAGNOSIS — E041 Nontoxic single thyroid nodule: Secondary | ICD-10-CM

## 2014-08-13 DIAGNOSIS — I10 Essential (primary) hypertension: Secondary | ICD-10-CM

## 2014-08-13 DIAGNOSIS — R61 Generalized hyperhidrosis: Secondary | ICD-10-CM

## 2014-08-13 NOTE — Progress Notes (Signed)
Passed clock drawing 

## 2014-08-13 NOTE — Progress Notes (Signed)
Patient ID: Donna [name], female   DOB: 28-Mar-1927, 81 y.o.   MRN: [id number]   Location:  [facility]  Code Status: DNR Goals of Care: Advanced Directive information Does patient have an advance directive?: Yes, Type of Advance Directive: Healthcare Power of Attorney  Chief Complaint  Patient presents with  . Annual Exam    Yearly check up, discuss labs (copy printed), EKG, MMSE (29/30, passed clock drawing)  . Medical Management of Chronic Issues    HTN, thyroid, & osteoarthritis     HPI: Patient is a 81 y.o. seen in the office today for medicare annual wellness exam and med mgt of her chronic diseases.    Depression screen [facility] 2/9 08/13/2014 05/07/2013  Decreased Interest 0 0  Down, Depressed, Hopeless 0 0  PHQ - 2 Score 0 0  She has been under more stress in the past month looking after her husband who has been weaker physically.  He has dementia.  They have a church group for caregivers who are dealing with sick family.    Fall Risk  08/13/2014 08/06/2013 05/07/2013  Falls in the past year? No No No    MMSE - Mini Mental State Exam 08/13/2014 08/06/2013  Orientation to time 5 5  Orientation to Place 5 5  Registration 3 3  Attention/ Calculation 5 5  Recall 2 3  Language- name 2 objects 2 2  Language- repeat 1 1  Language- follow 3 step command 3 3  Language- read & follow direction 1 1  Write a sentence 1 1  Copy design 1 1  Total score 29 30   Is independent in all ADLs and IADLs.  Exercise:  Says she does not exercise as much as she should.  Walks when cooler.  Her husband has dementia and she can't leave him much.    Immunizations:  Needs prevnar and pneumovax.  Has had her tdap last year.    Pain:  Has aches and pains in knees.  In winter, left hip was hurting.  Went to her orthopedic surgeon, Dr. Julius Bowels, he xrayed and decided she did not need a hip replacement.  He gave her a steroid shot that worked wonders.  Always has her  pain in her hands and wrists, but tolerates.  Urinary incontinence:  None  Last Tues, got up early in the am to use the restroom, and an hour later and felt like had great pressure to go again.  Could only go a small amt.  Eventually went away, but then contantly felt like she had to go.  Last UTI was 40 years ago and it was very painful.  Just completed cipro for UTI.  Feels better now.    Labs were all normal and reviewed with her.    Her biggest concern is to stay healthy to take care of him as long as she can.    Says she's had bone density twice and they've been normal.  Does not want another test.  I have none of these reports.  She is on calcium with D.  Did agree to prevnar today.    She is seeing and hearing well.  Review of Systems:  Review of Systems  Constitutional: Negative for fever and chills.  HENT: Negative for hearing loss.   Eyes: Negative for blurred vision.  Respiratory: Negative for shortness of breath.   Cardiovascular: Negative for chest pain and leg swelling.  Gastrointestinal: Negative for abdominal pain,  constipation, blood in stool and melena.  Genitourinary: Negative for dysuria, urgency and frequency.  Musculoskeletal: Positive for joint pain. Negative for myalgias and falls.  Skin: Negative for itching and rash.  Neurological: Negative for dizziness, loss of consciousness and headaches.  Endo/Heme/Allergies: Does not bruise/bleed easily.  Psychiatric/Behavioral: Negative for depression and memory loss.       Stress looking after her husband    Past Medical History  Diagnosis Date  . Thyroid cyst   . Migraine   . OA (osteoarthritis)   . Hypertension   . Hemorrhage of gastrointestinal tract, unspecified     Past Surgical History  Procedure Laterality Date  . Cataract extraction  1990    rt  . Cataract extraction  2005    left  . Tonsillectomy    . Revision total knee arthroplasty  2008  . Thumb arthroscopy      No Known  Allergies Medications: Patient's Medications  New Prescriptions   No medications on file  Previous Medications   CALCIUM CARBONATE-VITAMIN D 600-400 MG-UNIT PER TABLET    Take 2 tablets by mouth daily.   LEVOTHYROXINE (SYNTHROID, LEVOTHROID) 100 MCG TABLET    Take 1 tablet (100 mcg total) by mouth daily. hypothyroidism   LOSARTAN (COZAAR) 50 MG TABLET    Take 1 tablet (50 mg total) by mouth daily. hypertension   MULTIPLE VITAMINS-MINERALS (PRESERVISION AREDS 2) CAPS    Take by mouth. 1 by mouth twice daily  Modified Medications   No medications on file  Discontinued Medications   CIPROFLOXACIN (CIPRO) 500 MG TABLET    Take 1 tablet twice daily for 3 days.   TDAP (BOOSTRIX) 5-2.5-18.5 LF-MCG/0.5 INJECTION    Inject 0.5 mLs into the muscle once.    Physical Exam: Filed Vitals:   08/13/14 0902  BP: 138/86  Pulse: 76  Temp: 97.6 F (36.4 C)  TempSrc: Oral  Resp: 16  Height: 5\' 3"  (1.6 m)  Weight: 171 lb (77.565 kg)  SpO2: 94%   Physical Exam  Constitutional: She is oriented to person, place, and time. She appears well-developed and well-nourished. No distress.  HENT:  Head: Normocephalic and atraumatic.  Right Ear: External ear normal.  Left Ear: External ear normal.  Nose: Nose normal.  Mouth/Throat: Oropharynx is clear and moist. No oropharyngeal exudate.  No significant cerumen  Eyes: Conjunctivae and EOM are normal. Pupils are equal, round, and reactive to light.  Neck: Normal range of motion. Neck supple. No JVD present. No tracheal deviation present. No thyromegaly present.  Cardiovascular: Normal rate, regular rhythm, normal heart sounds and intact distal pulses.  Exam reveals no gallop and no friction rub.   No murmur heard. Pulmonary/Chest: Effort normal and breath sounds normal. No respiratory distress. Right breast exhibits no inverted nipple, no mass, no nipple discharge, no skin change and no tenderness. Left breast exhibits no inverted nipple, no mass, no nipple  discharge, no skin change and no tenderness.  Abdominal: Soft. Bowel sounds are normal. She exhibits no distension and no mass. There is no tenderness. There is no rebound and no guarding. No hernia.  Musculoskeletal: Normal range of motion. She exhibits no edema or tenderness.  Neurological: She is alert and oriented to person, place, and time. She has normal reflexes. No cranial nerve deficit.  Skin: Skin is warm and dry. There is pallor.  Psychiatric: She has a normal mood and affect. Her behavior is normal. Judgment and thought content normal.    Labs reviewed: Basic Metabolic Panel:  Recent Labs  08/04/14 0806  NA 140  K 4.5  CL 101  CO2 23  GLUCOSE 100*  BUN 15  CREATININE 0.72  CALCIUM 9.9  TSH 2.680   Liver Function Tests:  Recent Labs  08/04/14 0806  AST 14  ALT 20  ALKPHOS 47  BILITOT 0.4  PROT 6.0   No results for input(s): LIPASE, AMYLASE in the last 8760 hours. No results for input(s): AMMONIA in the last 8760 hours. CBC:  Recent Labs  08/04/14 0806  WBC 6.3  NEUTROABS 4.3  HCT 42.5   Lipid Panel:  Recent Labs  08/04/14 0806  CHOL 118  HDL 34*  LDLCALC 69  TRIG 77  CHOLHDL 3.5   Procedures since last visit: 08/06/14:  Mammogram:  Negative.  Assessment/Plan 1. Essential hypertension, benign - bp is at goal with losartan alone, renal function and potassium are normal--cont current treatment - CBC with Differential/Platelet; Future - Comprehensive metabolic panel; Future  2. Screening, lipid - lipids are wnl this year with high HDL -encouraged walking when able  - Lipid panel; Future  3. Hyperhydrosis disorder -ongoing difficulty with hot flashes has not changed despite normal thyroid and endocrine labs before - CBC with Differential/Platelet; Future - Comprehensive metabolic panel; Future  4. Cyst of thyroid - stable tsh on levothyroxine daily continued - CBC with Differential/Platelet; Future - TSH; Future  5. Need for  vaccination with 13-polyvalent pneumococcal conjugate vaccine -agreed to prevnar with a lot of convincing and this was given today  Labs/tests ordered:   Orders Placed This Encounter  Procedures  . CBC with Differential/Platelet    Standing Status: Future     Number of Occurrences:      Standing Expiration Date: 08/12/2016  . Comprehensive metabolic panel    Standing Status: Future     Number of Occurrences:      Standing Expiration Date: 08/12/2016    Order Specific Question:  Has the patient fasted?    Answer:  Yes  . Lipid panel    Standing Status: Future     Number of Occurrences:      Standing Expiration Date: 08/12/2016    Order Specific Question:  Has the patient fasted?    Answer:  Yes  . TSH    Standing Status: Future     Number of Occurrences:      Standing Expiration Date: 08/12/2016    Next appt:  1 year for annual wellness exam with labs before  Breandan People L. Adarryl Goldammer, D.O. Geriatrics [facility] [address], [place] [address] Cell Phone (Mon-Fri 8am-5pm):  [phone] On Call:  [phone] & follow prompts after 5pm & weekends Office Phone:  [phone] Office Fax:  [phone]

## 2014-11-02 DIAGNOSIS — H43813 Vitreous degeneration, bilateral: Secondary | ICD-10-CM | POA: Diagnosis not present

## 2014-11-02 DIAGNOSIS — H353133 Nonexudative age-related macular degeneration, bilateral, advanced atrophic without subfoveal involvement: Secondary | ICD-10-CM | POA: Diagnosis not present

## 2015-01-25 ENCOUNTER — Other Ambulatory Visit: Payer: Self-pay | Admitting: *Deleted

## 2015-01-25 DIAGNOSIS — E041 Nontoxic single thyroid nodule: Secondary | ICD-10-CM

## 2015-01-25 DIAGNOSIS — E038 Other specified hypothyroidism: Secondary | ICD-10-CM

## 2015-01-25 MED ORDER — LEVOTHYROXINE SODIUM 100 MCG PO TABS: 100.0000 ug | ORAL_TABLET | Freq: Every day | ORAL | Status: DC

## 2015-01-25 NOTE — Telephone Encounter (Signed)
Patient called and stated that she has lost her medication and needs a refill. Faxed to pharmacy.

## 2015-06-14 ENCOUNTER — Other Ambulatory Visit: Payer: Self-pay | Admitting: *Deleted

## 2015-06-14 DIAGNOSIS — I10 Essential (primary) hypertension: Secondary | ICD-10-CM

## 2015-06-14 MED ORDER — LOSARTAN POTASSIUM 50 MG PO TABS: 50.0000 mg | ORAL_TABLET | Freq: Every day | ORAL | Status: DC

## 2015-06-14 NOTE — Telephone Encounter (Signed)
[facility] 

## 2015-07-13 ENCOUNTER — Other Ambulatory Visit: Payer: Self-pay | Admitting: Internal Medicine

## 2015-07-13 DIAGNOSIS — Z1231 Encounter for screening mammogram for malignant neoplasm of breast: Secondary | ICD-10-CM

## 2015-08-08 ENCOUNTER — Ambulatory Visit
Admission: RE | Admit: 2015-08-08 | Discharge: 2015-08-08 | Disposition: A | Discharge status: Home / Self Care | Payer: Medicare Other | Source: Ambulatory Visit | Attending: Internal Medicine | Admitting: Internal Medicine

## 2015-08-08 DIAGNOSIS — Z1231 Encounter for screening mammogram for malignant neoplasm of breast: Secondary | ICD-10-CM

## 2015-08-09 ENCOUNTER — Encounter: Payer: Self-pay | Admitting: *Deleted

## 2015-08-09 NOTE — Addendum Note (Signed)
Addended by: Lodema Hong MESHELL A on: 08/09/2015 03:44 PM   Modules accepted: Orders

## 2015-08-15 ENCOUNTER — Other Ambulatory Visit: Payer: Self-pay

## 2015-08-15 DIAGNOSIS — I10 Essential (primary) hypertension: Secondary | ICD-10-CM

## 2015-08-15 DIAGNOSIS — Z13220 Encounter for screening for lipoid disorders: Secondary | ICD-10-CM

## 2015-08-15 DIAGNOSIS — R61 Generalized hyperhidrosis: Secondary | ICD-10-CM

## 2015-08-15 DIAGNOSIS — E041 Nontoxic single thyroid nodule: Secondary | ICD-10-CM

## 2015-08-15 LAB — CBC WITH DIFFERENTIAL/PLATELET
Basophils Absolute: 0 cells/uL (ref 0–200)
Basophils Relative: 0 %
Eosinophils Absolute: 230 cells/uL (ref 15–500)
Eosinophils Relative: 5 %
HCT: 42.4 % (ref 35.0–45.0)
Hemoglobin: 14.3 g/dL (ref 11.7–15.5)
Lymphocytes Relative: 25 %
Lymphs Abs: 1150 cells/uL (ref 850–3900)
MCH: 30.8 pg (ref 27.0–33.0)
MCHC: 33.7 g/dL (ref 32.0–36.0)
MCV: 91.4 fL (ref 80.0–100.0)
MPV: 10.5 fL (ref 7.5–12.5)
Monocytes Absolute: 506 cells/uL (ref 200–950)
Monocytes Relative: 11 %
Neutro Abs: 2714 cells/uL (ref 1500–7800)
Neutrophils Relative %: 59 %
Platelets: 170 10*3/uL (ref 140–400)
RBC: 4.64 MIL/uL (ref 3.80–5.10)
RDW: 13.8 % (ref 11.0–15.0)
WBC: 4.6 10*3/uL (ref 3.8–10.8)

## 2015-08-15 LAB — COMPREHENSIVE METABOLIC PANEL
ALT: 19 U/L (ref 6–29)
AST: 16 U/L (ref 10–35)
Albumin: 4.2 g/dL (ref 3.6–5.1)
Alkaline Phosphatase: 36 U/L (ref 33–130)
BUN: 15 mg/dL (ref 7–25)
CO2: 25 mmol/L (ref 20–31)
Calcium: 9.4 mg/dL (ref 8.6–10.4)
Chloride: 105 mmol/L (ref 98–110)
Creat: 0.78 mg/dL (ref 0.60–0.88)
Glucose, Bld: 104 mg/dL — ABNORMAL HIGH (ref 65–99)
Potassium: 4.9 mmol/L (ref 3.5–5.3)
Sodium: 135 mmol/L (ref 135–146)
Total Bilirubin: 0.4 mg/dL (ref 0.2–1.2)
Total Protein: 5.7 g/dL — ABNORMAL LOW (ref 6.1–8.1)

## 2015-08-15 LAB — LIPID PANEL
Cholesterol: 113 mg/dL — ABNORMAL LOW (ref 125–200)
HDL: 32 mg/dL — ABNORMAL LOW (ref >=46)
LDL Cholesterol: 62 mg/dL (ref <130)
Total CHOL/HDL Ratio: 3.5 Ratio (ref <=5.0)
Triglycerides: 95 mg/dL (ref <150)
VLDL: 19 mg/dL (ref <30)

## 2015-08-15 LAB — TSH: TSH: 2.75 mIU/L

## 2015-08-18 ENCOUNTER — Encounter: Payer: Self-pay | Admitting: *Deleted

## 2015-08-19 ENCOUNTER — Ambulatory Visit (INDEPENDENT_AMBULATORY_CARE_PROVIDER_SITE_OTHER): Payer: Medicare Other | Admitting: Internal Medicine

## 2015-08-19 ENCOUNTER — Encounter: Payer: Self-pay | Admitting: Internal Medicine

## 2015-08-19 VITALS — BP 140/70 | HR 66 | Temp 98.1°F | Ht 62.75 in | Wt 170.0 lb

## 2015-08-19 DIAGNOSIS — Z6830 Body mass index (BMI) 30.0-30.9, adult: Secondary | ICD-10-CM | POA: Diagnosis not present

## 2015-08-19 DIAGNOSIS — Z0000 Encounter for general adult medical examination without abnormal findings: Secondary | ICD-10-CM | POA: Diagnosis not present

## 2015-08-19 DIAGNOSIS — E041 Nontoxic single thyroid nodule: Secondary | ICD-10-CM | POA: Diagnosis not present

## 2015-08-19 DIAGNOSIS — M792 Neuralgia and neuritis, unspecified: Secondary | ICD-10-CM | POA: Diagnosis not present

## 2015-08-19 DIAGNOSIS — M4806 Spinal stenosis, lumbar region: Secondary | ICD-10-CM

## 2015-08-19 DIAGNOSIS — M48062 Spinal stenosis, lumbar region with neurogenic claudication: Secondary | ICD-10-CM

## 2015-08-19 DIAGNOSIS — L74519 Primary focal hyperhidrosis, unspecified: Secondary | ICD-10-CM | POA: Diagnosis not present

## 2015-08-19 DIAGNOSIS — Z23 Encounter for immunization: Secondary | ICD-10-CM | POA: Diagnosis not present

## 2015-08-19 DIAGNOSIS — R61 Generalized hyperhidrosis: Secondary | ICD-10-CM

## 2015-08-19 MED ORDER — GABAPENTIN 100 MG PO CAPS: 100.0000 mg | ORAL_CAPSULE | Freq: Three times a day (TID) | ORAL | 3 refills | Status: DC

## 2015-08-19 NOTE — Progress Notes (Signed)
Location:  [facility] clinic Provider: Lynasia Meloche L. Renato Gails, D.O., C.M.D.  Patient Care Team: Kermit Balo, DO as PCP - General (Geriatric Medicine) Everardo Pacific, OD as Consulting Physician (Optometry) Awilda Bill, MD as Referring Physician (Orthopedic Surgery)  Extended Emergency Contact Information Primary Emergency Contact: Jeri Lager of [place] Home Phone: [phone] Relation: Daughter  Code Status: DNR Goals of Care: Advanced Directive information Advanced Directives 08/19/2015  Does patient have an advance directive? Yes  Type of Advance Directive Healthcare Power of Attorney  Copy of advanced directive(s) in chart? Yes   Chief Complaint  Patient presents with  . Annual Exam    wellness exam  . MMSE    30/30 passed clock   HPI: Patient is a 82 y.o. female seen in today for an annual wellness exam.    Her husband's dementia is fairly stable and she can keep him at home now.  Lumbar spinal stenosis:  Cannot stand or walk for long.  Uses rest and tylenol for pain.   Has burning sensation in whole body at times.  Very uncomfortable.  Sometimes sweats accompany it, but not always.    Depression screen [facility] 2/9 08/19/2015 08/13/2014 05/07/2013  Decreased Interest 0 0 0  Down, Depressed, Hopeless 0 0 0  PHQ - 2 Score 0 0 0    Fall Risk  08/19/2015 08/13/2014 08/06/2013 05/07/2013  Falls in the past year? No No No No   MMSE - Mini Mental State Exam 08/19/2015 08/13/2014 08/06/2013  Orientation to time 5 5 5   Orientation to Place 5 5 5   Registration 3 3 3   Attention/ Calculation 5 5 5   Recall 3 2 3   Language- name 2 objects 2 2 2   Language- repeat 1 1 1   Language- follow 3 step command 3 3 3   Language- read & follow direction 1 1 1   Write a sentence 1 1 1   Copy design 1 1 1   Total score 30 29 30   passed clock  Health Maintenance  Topic Date Due  . PNA vac Low Risk Adult (2 of 2 - PPSV23) 08/13/2015  . INFLUENZA VACCINE  08/16/2015  . DEXA SCAN   01/15/2018 (Originally 01/04/1995)  . TETANUS/TDAP  08/28/2023  . ZOSTAVAX  Completed   Functional Status Survey: Is the patient deaf or have difficulty hearing?: No Does the patient have difficulty seeing, even when wearing glasses/contacts?: No (no difference to her, follows with ophtho) Does the patient have difficulty concentrating, remembering, or making decisions?: No Does the patient have difficulty walking or climbing stairs?: Yes (thinks she has spinal stenosis) Does the patient have difficulty dressing or bathing?: No Does the patient have difficulty doing errands alone such as visiting a doctor's office or shopping?: No Current Exercise Habits: The patient does not participate in regular exercise at present Exercise limited by: orthopedic condition(s);Other - see comments (cannot stand or walking long times; husband feeble) Diet?  No special diet Vision Screening Comments: Dr. Allena Katz & Dr. Allyne Gee twice yearly, macular degeneration  Need note--requested. Hearing:  No problems Dentition:  No problems  Past Medical History:  Diagnosis Date  . Hemorrhage of gastrointestinal tract, unspecified   . Hypertension   . Migraine   . OA (osteoarthritis)   . Thyroid cyst     Past Surgical History:  Procedure Laterality Date  . CATARACT EXTRACTION  1990   rt  . CATARACT EXTRACTION  2005   left  . REVISION TOTAL KNEE ARTHROPLASTY  2008  .  THUMB ARTHROSCOPY    . TONSILLECTOMY      Family History  Problem Relation Age of Onset  . CVA Father 52  . Pancreatic cancer Mother   . CVA Sister 86  . Brain cancer Brother   . Arthritis Sister     Social History   Social History  . Marital status: Married    Spouse name: N/A  . Number of children: N/A  . Years of education: N/A   Social History Main Topics  . Smoking status: Former Smoker    Years: 2.00    Types: Cigarettes    Quit date: 01/16/1948  . Smokeless tobacco: None     Comment: smoked on occ when she was a teen  .  Alcohol use Yes     Comment: rarely has a glass of wine  . Drug use: No  . Sexual activity: Not Asked   Other Topics Concern  . None   Social History Narrative  . None    reports that she quit smoking about 67 years ago. Her smoking use included Cigarettes. She quit after 2.00 years of use. She does not have any smokeless tobacco history on file. She reports that she drinks alcohol. She reports that she does not use drugs.  No Known Allergies    Medication List       Accurate as of 08/19/15  9:23 AM. Always use your most recent med list.          Calcium Carbonate-Vitamin D 600-400 MG-UNIT tablet Take 2 tablets by mouth daily.   levothyroxine 100 MCG tablet Commonly known as:  SYNTHROID, LEVOTHROID Take 1 tablet (100 mcg total) by mouth daily. hypothyroidism   losartan 50 MG tablet Commonly known as:  COZAAR Take 1 tablet (50 mg total) by mouth daily. hypertension   PRESERVISION AREDS 2 Caps Take by mouth. 1 by mouth twice daily      Review of Systems:  Review of Systems  Constitutional: Positive for diaphoresis. Negative for chills, fever, malaise/fatigue and weight loss.  HENT: Negative for congestion and hearing loss.   Eyes: Negative for blurred vision.       Macular on I caps  Cardiovascular: Negative for chest pain, palpitations and leg swelling.  Gastrointestinal: Negative for abdominal pain, blood in stool, constipation and melena.  Genitourinary: Positive for urgency. Negative for dysuria and frequency.  Musculoskeletal: Positive for back pain. Negative for falls, joint pain and myalgias.  Skin: Negative for itching and rash.       Burning sensation all over at times  Neurological: Positive for tingling and sensory change. Negative for dizziness, loss of consciousness, weakness and headaches.       All over  Psychiatric/Behavioral: Negative for depression and memory loss. The patient is not nervous/anxious.     Physical Exam: Vitals:   08/19/15 0857    BP: 140/68  Pulse: 66  Temp: 98.1 F (36.7 C)  TempSrc: Oral  SpO2: 95%  Weight: 170 lb (77.1 kg)  Height: 5' 2.75" (1.594 m)   Body mass index is 30.35 kg/m. Physical Exam  Constitutional: She is oriented to person, place, and time. She appears well-developed and well-nourished. No distress.  HENT:  Head: Normocephalic and atraumatic.  Right Ear: External ear normal.  Left Ear: External ear normal.  Nose: Nose normal.  Mouth/Throat: Oropharynx is clear and moist.  Eyes: Conjunctivae and EOM are normal.  Right pupil slightly larger (wearing contact also)  Neck: Normal range of motion. Neck supple.  Cardiovascular: Normal rate, regular rhythm, normal heart sounds and intact distal pulses.   No murmur heard. Pulmonary/Chest: Effort normal and breath sounds normal. No respiratory distress. Right breast exhibits no inverted nipple, no mass, no nipple discharge, no skin change and no tenderness. Left breast exhibits no inverted nipple, no mass, no nipple discharge, no skin change and no tenderness.  Abdominal: Soft. Bowel sounds are normal. She exhibits no distension and no mass. There is no tenderness.  Musculoskeletal: Normal range of motion.  Lymphadenopathy:    She has no cervical adenopathy.       Right axillary: No pectoral and no lateral adenopathy present.       Left axillary: No pectoral and no lateral adenopathy present.      Right: No supraclavicular adenopathy present.       Left: No supraclavicular adenopathy present.  Neurological: She is alert and oriented to person, place, and time. She has normal reflexes. No cranial nerve deficit. She exhibits normal muscle tone.  Skin: Skin is warm and dry. Capillary refill takes less than 2 seconds.  Psychiatric: She has a normal mood and affect. Her behavior is normal. Judgment and thought content normal.    Labs reviewed: Basic Metabolic Panel:  Recent Labs  56/21/30 0817  NA 135  K 4.9  CL 105  CO2 25  GLUCOSE 104*   BUN 15  CREATININE 0.78  CALCIUM 9.4  TSH 2.75   Liver Function Tests:  Recent Labs  08/15/15 0817  AST 16  ALT 19  ALKPHOS 36  BILITOT 0.4  PROT 5.7*  ALBUMIN 4.2   No results for input(s): LIPASE, AMYLASE in the last 8760 hours. No results for input(s): AMMONIA in the last 8760 hours. CBC:  Recent Labs  08/15/15 0817  WBC 4.6  NEUTROABS 2,714  HGB 14.3  HCT 42.4  MCV 91.4  PLT 170   Lipid Panel:  Recent Labs  08/15/15 0817  CHOL 113*  HDL 32*  LDLCALC 62  TRIG 95  CHOLHDL 3.5   No results found for: HGBA1C  Procedures: Mm Digital Screening Bilateral  Result Date: 08/08/2015 CLINICAL DATA:  Screening. EXAM: DIGITAL SCREENING BILATERAL MAMMOGRAM WITH CAD COMPARISON:  Previous exam(s). ACR Breast Density Category b: There are scattered areas of fibroglandular density. FINDINGS: There are no findings suspicious for malignancy. Images were processed with CAD. IMPRESSION: No mammographic evidence of malignancy. A result letter of this screening mammogram will be mailed directly to the patient. RECOMMENDATION: Screening mammogram in one year. (Code:SM-B-01Y) BI-RADS CATEGORY  1: Negative. Electronically Signed   By: Bary Richard M.D.   On: 08/08/2015 13:44   Assessment/Plan 1. Annual physical exam - completed, wellness done above - EKG 12-Lead  2. Body mass index (BMI) of 30.0-30.9 in adult - discussed alternative exercise like cycling or swimming/water aerobics as she cannot walk long distances due to her back and her husband's dementia and frail state (she cannot push him much) - Hemoglobin A1c; Future - Lipid panel; Future - TSH; Future - Basic metabolic panel; Future  3. Neuropathic pain - burning sensation of her skin--will try to treat with gabapentin - gabapentin (NEURONTIN) 100 MG capsule; Take 1 capsule (100 mg total) by mouth 3 (three) times daily.  Dispense: 30 capsule; Refill: 3  4. Hyperhidrosis - seems like hot flashes form menopause  which typically improve with gabapentin and I've see gyn use it so will try this on her and see how she does - gabapentin (NEURONTIN) 100 MG capsule;  Take 1 capsule (100 mg total) by mouth 3 (three) times daily.  Dispense: 30 capsule; Refill: 3  5. Spinal stenosis, lumbar region, with neurogenic claudication - will see if gabapentin can improve symptoms frpm this also - gabapentin (NEURONTIN) 100 MG capsule; Take 1 capsule (100 mg total) by mouth 3 (three) times daily.  Dispense: 30 capsule; Refill: 3  6. Cyst of thyroid -stable, no changes  7. Need for prophylactic vaccination against Streptococcus pneumoniae (pneumococcus) - Pneumococcal polysaccharide vaccine 23-valent greater than or equal to 2yo subcutaneous/IM given   Labs/tests ordered:   Orders Placed This Encounter  Procedures  . Pneumococcal polysaccharide vaccine 23-valent greater than or equal to 2yo subcutaneous/IM  . Hemoglobin A1c    Standing Status:   Future    Standing Expiration Date:   08/18/2016  . Lipid panel    Standing Status:   Future    Standing Expiration Date:   08/18/2016  . TSH    Standing Status:   Future    Standing Expiration Date:   08/18/2016  . Basic metabolic panel    Standing Status:   Future    Standing Expiration Date:   08/18/2016  . EKG 12-Lead    Next appt:  1 year for annual, labs before and prn   Gloyd Happ L. Pryce Folts, D.O. Geriatrics [facility] [address], [place] [address] Cell Phone (Mon-Fri 8am-5pm):  [phone] On Call:  [phone] & follow prompts after 5pm & weekends Office Phone:  [phone] Office Fax:  [phone]

## 2015-08-19 NOTE — Patient Instructions (Addendum)
Let's see if gabapentin will help your sweats, nerve pains and restless legs.  We can start with  at bedtime (30 day supply sent to [facility]).  If it helps and you are tolerating it, but still having some symptoms, we can increase it to the  at bedtime.  Let me know how it goes.    I encourage you to try a different kind of exercise like a stationary bike or water aerobics since walking or standing for long make your back painful.  Fat and Cholesterol Restricted Diet Getting too much fat and cholesterol in your diet may cause health problems. Following this diet helps keep your fat and cholesterol at normal levels. This can keep you from getting sick. WHAT TYPES OF FAT SHOULD I CHOOSE?  Choose monosaturated and polyunsaturated fats. These are found in foods such as olive oil, canola oil, flaxseeds, walnuts, almonds, and seeds.  Eat more omega-3 fats. Good choices include salmon, mackerel, sardines, tuna, flaxseed oil, and ground flaxseeds.  Limit saturated fats. These are in animal products such as meats, butter, and cream. They can also be in plant products such as palm oil, palm kernel oil, and coconut oil.   Avoid foods with partially hydrogenated oils in them. These contain trans fats. Examples of foods that have trans fats are stick margarine, some tub margarines, cookies, crackers, and other baked goods. WHAT GENERAL GUIDELINES DO I NEED TO FOLLOW?   Check food labels. Look for the words "trans fat" and "saturated fat."  When preparing a meal:  Fill half of your plate with vegetables and green salads.  Fill one fourth of your plate with whole grains. Look for the word "whole" as the first word in the ingredient list.  Fill one fourth of your plate with lean protein foods.  Limit fruit to two servings a day. Choose fruit instead of juice.  Eat more foods with soluble fiber. Examples of foods with this type of fiber are apples, broccoli, carrots, beans, peas, and barley.  Try to get 20-30 g (grams) of fiber per day.  Eat more home-cooked foods. Eat less at restaurants and buffets.  Limit or avoid alcohol.  Limit foods high in starch and sugar.  Limit fried foods.  Cook foods without frying them. Baking, boiling, grilling, and broiling are all great options.  Lose weight if you are overweight. Losing even a small amount of weight can help your overall health. It can also help prevent diseases such as diabetes and heart disease. WHAT FOODS CAN I EAT? Grains Whole grains, such as whole wheat or whole grain breads, crackers, cereals, and pasta. Unsweetened oatmeal, bulgur, barley, quinoa, or brown rice. Corn or whole wheat flour tortillas. Vegetables Fresh or frozen vegetables (raw, steamed, roasted, or grilled). Green salads. Fruits All fresh, canned (in natural juice), or frozen fruits. Meat and Other Protein Products Ground beef (85% or leaner), grass-fed beef, or beef trimmed of fat. Skinless chicken or [place]. Ground chicken or [place]. Pork trimmed of fat. All fish and seafood. Eggs. Dried beans, peas, or lentils. Unsalted nuts or seeds. Unsalted canned or dry beans. Dairy Low-fat dairy products, such as skim or 1% milk, 2% or reduced-fat cheeses, low-fat ricotta or cottage cheese, or plain low-fat yogurt. Fats and Oils Tub margarines without trans fats. Light or reduced-fat mayonnaise and salad dressings. Avocado. Olive, canola, sesame, or safflower oils. Natural peanut or almond butter (choose ones without added sugar and oil). The items listed above may not be a complete  list of recommended foods or beverages. Contact your dietitian for more options. WHAT FOODS ARE NOT RECOMMENDED? Grains White bread. White pasta. White rice. Cornbread. Bagels, pastries, and croissants. Crackers that contain trans fat. Vegetables White potatoes. Corn. Creamed or fried vegetables. Vegetables in a cheese sauce. Fruits Dried fruits. Canned fruit in light or heavy  syrup. Fruit juice. Meat and Other Protein Products Fatty cuts of meat. Ribs, chicken wings, bacon, sausage, bologna, salami, chitterlings, fatback, hot dogs, bratwurst, and packaged luncheon meats. Liver and organ meats. Dairy Whole or 2% milk, cream, half-and-half, and cream cheese. Whole milk cheeses. Whole-fat or sweetened yogurt. Full-fat cheeses. Nondairy creamers and whipped toppings. Processed cheese, cheese spreads, or cheese curds. Sweets and Desserts Corn syrup, sugars, honey, and molasses. Candy. Jam and jelly. Syrup. Sweetened cereals. Cookies, pies, cakes, donuts, muffins, and ice cream. Fats and Oils Butter, stick margarine, lard, shortening, ghee, or bacon fat. Coconut, palm kernel, or palm oils. Beverages Alcohol. Sweetened drinks (such as sodas, lemonade, and fruit drinks or punches). The items listed above may not be a complete list of foods and beverages to avoid. Contact your dietitian for more information.   This information is not intended to replace advice given to you by your health care provider. Make sure you discuss any questions you have with your health care provider.   Document Released: 07/03/2011 Document Revised: 01/22/2014 Document Reviewed: 04/02/2013 Elsevier Interactive Patient Education [employer]

## 2015-08-22 ENCOUNTER — Telehealth: Payer: Self-pay

## 2015-08-22 DIAGNOSIS — R61 Generalized hyperhidrosis: Secondary | ICD-10-CM

## 2015-08-22 DIAGNOSIS — M48062 Spinal stenosis, lumbar region with neurogenic claudication: Secondary | ICD-10-CM

## 2015-08-22 DIAGNOSIS — M792 Neuralgia and neuritis, unspecified: Secondary | ICD-10-CM

## 2015-08-22 MED ORDER — GABAPENTIN 100 MG PO CAPS: 100.0000 mg | ORAL_CAPSULE | Freq: Every day | ORAL | 3 refills | Status: DC

## 2015-08-22 NOTE — Telephone Encounter (Signed)
Patient was in the office Friday and was verbally told to take gabapentin 1 by mouth daily at bedtime. When patient went to the pharmacy the instructions state take take three times daily.  Patient took medication as she was verbally instructed. Patient would like for Dr.Reed to clarify

## 2015-08-22 NOTE — Telephone Encounter (Signed)
Per Dr.Reed patient to take once daily, patient aware

## 2015-10-25 ENCOUNTER — Encounter: Payer: Self-pay | Admitting: Internal Medicine

## 2016-01-19 ENCOUNTER — Other Ambulatory Visit: Payer: Self-pay | Admitting: Internal Medicine

## 2016-01-19 DIAGNOSIS — E041 Nontoxic single thyroid nodule: Secondary | ICD-10-CM

## 2016-01-19 DIAGNOSIS — E038 Other specified hypothyroidism: Secondary | ICD-10-CM

## 2016-01-23 ENCOUNTER — Telehealth: Payer: Self-pay | Admitting: Internal Medicine

## 2016-01-23 NOTE — Telephone Encounter (Signed)
left msg asking pt to confirm this AWV appt w/ nurse. VDM (DD) °

## 2016-03-27 ENCOUNTER — Encounter: Payer: Self-pay | Admitting: Internal Medicine

## 2016-04-18 ENCOUNTER — Other Ambulatory Visit: Payer: Self-pay | Admitting: Internal Medicine

## 2016-04-18 DIAGNOSIS — M48062 Spinal stenosis, lumbar region with neurogenic claudication: Secondary | ICD-10-CM

## 2016-04-18 DIAGNOSIS — M792 Neuralgia and neuritis, unspecified: Secondary | ICD-10-CM

## 2016-04-18 DIAGNOSIS — R61 Generalized hyperhidrosis: Secondary | ICD-10-CM

## 2016-06-13 ENCOUNTER — Encounter: Payer: Self-pay | Admitting: Internal Medicine

## 2016-06-19 ENCOUNTER — Encounter: Payer: Self-pay | Admitting: Internal Medicine

## 2016-06-20 ENCOUNTER — Other Ambulatory Visit: Payer: Self-pay | Admitting: Internal Medicine

## 2016-06-20 DIAGNOSIS — I10 Essential (primary) hypertension: Secondary | ICD-10-CM

## 2016-07-03 ENCOUNTER — Other Ambulatory Visit: Payer: Self-pay | Admitting: Internal Medicine

## 2016-07-03 DIAGNOSIS — Z1231 Encounter for screening mammogram for malignant neoplasm of breast: Secondary | ICD-10-CM

## 2016-08-09 ENCOUNTER — Ambulatory Visit: Payer: Medicare Other

## 2016-08-20 ENCOUNTER — Other Ambulatory Visit: Payer: Medicare Other

## 2016-08-20 ENCOUNTER — Ambulatory Visit: Payer: Medicare Other

## 2016-08-22 ENCOUNTER — Other Ambulatory Visit: Payer: Medicare Other

## 2016-08-22 ENCOUNTER — Ambulatory Visit (INDEPENDENT_AMBULATORY_CARE_PROVIDER_SITE_OTHER): Payer: Medicare Other

## 2016-08-22 VITALS — BP 120/55 | HR 72 | Temp 97.7°F | Ht 63.0 in | Wt 161.0 lb

## 2016-08-22 DIAGNOSIS — Z6830 Body mass index (BMI) 30.0-30.9, adult: Secondary | ICD-10-CM

## 2016-08-22 DIAGNOSIS — Z0000 Encounter for general adult medical examination without abnormal findings: Secondary | ICD-10-CM | POA: Diagnosis not present

## 2016-08-22 LAB — TSH: TSH: 1.4 mIU/L

## 2016-08-22 NOTE — Progress Notes (Signed)
Subjective:   Donna [name] is a 83 y.o. female who presents for an Initial Medicare Annual Wellness Visit.       Objective:    Today's Vitals   08/22/16 0937  BP: (!) 120/55  Pulse: 72  Temp: 97.7 F (36.5 C)  TempSrc: Oral  SpO2: 96%  Weight: 161 lb (73 kg)  Height: 5\' 3"  (1.6 m)   Body mass index is 28.52 kg/m.   Current Medications (verified) Outpatient Encounter Prescriptions as of 08/22/2016  Medication Sig  . Calcium Carbonate-Vitamin D 600-400 MG-UNIT per tablet Take 2 tablets by mouth daily.  Marland Kitchen. gabapentin (NEURONTIN) 100 MG capsule take 1 capsule by mouth at bedtime  . losartan (COZAAR) 50 MG tablet take 1 tablet by mouth once daily  . Multiple Vitamins-Minerals (PRESERVISION AREDS 2) CAPS Take by mouth. 1 by mouth twice daily  . SYNTHROID 100 MCG tablet take 1 tablet by mouth once daily   No facility-administered encounter medications on file as of 08/22/2016.     Allergies (verified) Patient has no known allergies.   History: Past Medical History:  Diagnosis Date  . Hemorrhage of gastrointestinal tract, unspecified   . Hypertension   . Migraine   . OA (osteoarthritis)   . Thyroid cyst    Past Surgical History:  Procedure Laterality Date  . CATARACT EXTRACTION  1990   rt  . CATARACT EXTRACTION  2005   left  . REVISION TOTAL KNEE ARTHROPLASTY  2008  . THUMB ARTHROSCOPY    . TONSILLECTOMY     Family History  Problem Relation Age of Onset  . CVA Father 7179  . Pancreatic cancer Mother   . CVA Sister 2165  . Brain cancer Brother   . Arthritis Sister    Social History   Occupational History  . Not on file.   Social History Main Topics  . Smoking status: Former Smoker    Years: 2.00    Types: Cigarettes    Quit date: 01/16/1948  . Smokeless tobacco: Never Used     Comment: smoked on occ when she was a teen  . Alcohol use Yes     Comment: rarely has a glass of wine  . Drug use: No  . Sexual activity: Not on file    Tobacco  Counseling Counseling given: Not Answered   Activities of Daily Living In your present state of health, do you have any difficulty performing the following activities: 08/22/2016  Hearing? N  Vision? N  Difficulty concentrating or making decisions? Y  Walking or climbing stairs? N  Dressing or bathing? N  Doing errands, shopping? N  Preparing Food and eating ? N  Using the Toilet? N  In the past six months, have you accidently leaked urine? N  Do you have problems with loss of bowel control? N  Managing your Medications? N  Managing your Finances? N  Housekeeping or managing your Housekeeping? N  Some recent data might be hidden    Immunizations and Health Maintenance Immunization History  Administered Date(s) Administered  . Influenza Whole 10/16/2011  . Influenza,inj,Quad PF,36+ Mos 09/21/2015  . Influenza-Unspecified 10/22/2013, 10/01/2014  . Pneumococcal Conjugate-13 08/13/2014  . Pneumococcal Polysaccharide-23 08/19/2015  . Tdap 08/27/2013  . Zoster 01/16/2011  . Zoster Recombinat (Shingrix) 05/29/2016   Health Maintenance Due  Topic Date Due  . INFLUENZA VACCINE  08/15/2016    Patient Care Team: Kermit Baloeed, Tiffany L, DO as PCP - General (Geriatric Medicine) Everardo PacificPatel, Niyati, OD as Consulting Physician (  Optometry) Awilda Bill., MD as Referring Physician (Orthopedic Surgery)  Indicate any recent Medical Services you may have received from other than [facility] providers in the past year (date may be approximate).     Assessment:   This is a routine wellness examination for Donna [name].   Hearing/Vision screen No exam data present  Dietary issues and exercise activities discussed: Current Exercise Habits: The patient does not participate in regular exercise at present, Exercise limited by: None identified  Goals    . Maintain self care          Patient will maintain self care      Depression Screen PHQ 2/9 Scores 08/22/2016 08/19/2015 08/13/2014 05/07/2013  PHQ - 2 Score 0  0 0 0    Fall Risk Fall Risk  08/22/2016 08/19/2015 08/13/2014 08/06/2013 05/07/2013  Falls in the past year? No No No No No    Cognitive Function: MMSE - Mini Mental State Exam 08/22/2016 08/19/2015 08/13/2014 08/06/2013  Orientation to time 5 5 5 5   Orientation to Place 5 5 5 5   Registration 3 3 3 3   Attention/ Calculation 5 5 5 5   Recall 2 3 2 3   Language- name 2 objects 2 2 2 2   Language- repeat 1 1 1 1   Language- follow 3 step command 3 3 3 3   Language- read & follow direction 1 1 1 1   Write a sentence 1 1 1 1   Copy design 1 1 1 1   Total score 29 30 29 30         Screening Tests Health Maintenance  Topic Date Due  . INFLUENZA VACCINE  08/15/2016  . DEXA SCAN  01/15/2018 (Originally 01/04/1995)  . TETANUS/TDAP  08/28/2023  . PNA vac Low Risk Adult  Completed      Plan:    I have personally reviewed and addressed the Medicare Annual Wellness questionnaire and have noted the following in the patient's chart:  A. Medical and social history B. Use of alcohol, tobacco or illicit drugs  C. Current medications and supplements D. Functional ability and status E.  Nutritional status F.  Physical activity G. Advance directives H. List of other physicians I.  Hospitalizations, surgeries, and ER visits in previous 12 months J.  Vitals K. Screenings to include hearing, vision, cognitive, depression L. Referrals and appointments - none  In addition, I have reviewed and discussed with patient certain preventive protocols, quality metrics, and best practice recommendations. A written personalized care plan for preventive services as well as general preventive health recommendations were provided to patient.  See attached scanned questionnaire for additional information.   Signed,   Annetta Maw, RN Nurse Health Advisor   Quick Notes   Health Maintenance: Up to date     Abnormal Screen: MMSE 29/30. Passed clock drawing     Patient Concerns: None     Nurse Concerns:  None

## 2016-08-22 NOTE — Patient Instructions (Signed)
[name] , Thank you for taking time to come for your Medicare Wellness Visit. I appreciate your ongoing commitment to your health goals. Please review the following plan we discussed and let me know if I can assist you in the future.   Screening recommendations/referrals: Colonoscopy excluded, pt over age 81 Mammogram excluded, pt over age 81 Bone Density up to date Recommended yearly ophthalmology/optometry visit for glaucoma screening and checkup Recommended yearly dental visit for hygiene and checkup  Vaccinations: Influenza vaccine due 2018 fall season Pneumococcal vaccine up to date Tdap vaccine up to date. Due 08/28/2023 Shingles vaccine due 2-6 months from first shot in spring 2018  Advanced directives: In Chart  Conditions/risks identified: None  Next appointment: Dr. Renato Gailseed 09/03/16 @9am    Preventive Care 65 Years and Older, Female Preventive care refers to lifestyle choices and visits with your health care provider that can promote health and wellness. What does preventive care include?  A yearly physical exam. This is also called an annual well check.  Dental exams once or twice a year.  Routine eye exams. Ask your health care provider how often you should have your eyes checked.  Personal lifestyle choices, including:  Daily care of your teeth and gums.  Regular physical activity.  Eating a healthy diet.  Avoiding tobacco and drug use.  Limiting alcohol use.  Practicing safe sex.  Taking low-dose aspirin every day.  Taking vitamin and mineral supplements as recommended by your health care provider. What happens during an annual wel2l check? The services and screenings done by your health care provider during your annual well check will depend on your age, overall health, lifestyle risk factors, and family history of disease. Counseling  Your health care provider may ask you questions about your:  Alcohol use.  Tobacco use.  Drug use.  Emotional  well-being.  Home and relationship well-being.  Sexual activity.  Eating habits.  History of falls.  Memory and ability to understand (cognition).  Work and work Astronomerenvironment.  Reproductive health. Screening  You may have the following tests or measurements:  Height, weight, and BMI.  Blood pressure.  Lipid and cholesterol levels. These may be checked every 5 years, or more frequently if you are over 499 years old.  Skin check.  Lung cancer screening. You may have this screening every year starting at age 81 if you have a 30-pack-year history of smoking and currently smoke or have quit within the past 15 years.  Fecal occult blood test (FOBT) of the stool. You may have this test every year starting at age 81.  Flexible sigmoidoscopy or colonoscopy. You may have a sigmoidoscopy every 5 years or a colonoscopy every 10 years starting at age 81.  Hepatitis C blood test.  Hepatitis B blood test.  Sexually transmitted disease (STD) testing.  Diabetes screening. This is done by checking your blood sugar (glucose) after you have not eaten for a while (fasting). You may have this done every 1-3 years.  Bone density scan. This is done to screen for osteoporosis. You may have this done starting at age 81.  Mammogram. This may be done every 1-2 years. Talk to your health care provider about how often you should have regular mammograms. Talk with your health care provider about your test results, treatment options, and if necessary, the need for more tests. Vaccines  Your health care provider may recommend certain vaccines, such as:  Influenza vaccine. This is recommended every year.  Tetanus, diphtheria, and acellular pertussis (  Tdap, Td) vaccine. You may need a Td booster every 10 years.  Zoster vaccine. You may need this after age 13.  Pneumococcal 13-valent conjugate (PCV13) vaccine. One dose is recommended after age 62.  Pneumococcal polysaccharide (PPSV23) vaccine. One  dose is recommended after age 76. Talk to your health care provider about which screenings and vaccines you need and how often you need them. This information is not intended to replace advice given to you by your health care provider. Make sure you discuss any questions you have with your health care provider. Document Released: 01/28/2015 Document Revised: 09/21/2015 Document Reviewed: 11/02/2014 Elsevier Interactive Patient Education  2017 [place] Prevention in the Home Falls can cause injuries. They can happen to people of all ages. There are many things you can do to make your home safe and to help prevent falls. What can I do on the outside of my home?  Regularly fix the edges of walkways and driveways and fix any cracks.  Remove anything that might make you trip as you walk through a door, such as a raised step or threshold.  Trim any bushes or trees on the path to your home.  Use bright outdoor lighting.  Clear any walking paths of anything that might make someone trip, such as rocks or tools.  Regularly check to see if handrails are loose or broken. Make sure that both sides of any steps have handrails.  Any raised decks and porches should have guardrails on the edges.  Have any leaves, snow, or ice cleared regularly.  Use sand or salt on walking paths during winter.  Clean up any spills in your garage right away. This includes oil or grease spills. What can I do in the bathroom?  Use night lights.  Install grab bars by the toilet and in the tub and shower. Do not use towel bars as grab bars.  Use non-skid mats or decals in the tub or shower.  If you need to sit down in the shower, use a plastic, non-slip stool.  Keep the floor dry. Clean up any water that spills on the floor as soon as it happens.  Remove soap buildup in the tub or shower regularly.  Attach bath mats securely with double-sided non-slip rug tape.  Do not have throw rugs and other  things on the floor that can make you trip. What can I do in the bedroom?  Use night lights.  Make sure that you have a light by your bed that is easy to reach.  Do not use any sheets or blankets that are too big for your bed. They should not hang down onto the floor.  Have a firm chair that has side arms. You can use this for support while you get dressed.  Do not have throw rugs and other things on the floor that can make you trip. What can I do in the kitchen?  Clean up any spills right away.  Avoid walking on wet floors.  Keep items that you use a lot in easy-to-reach places.  If you need to reach something above you, use a strong step stool that has a grab bar.  Keep electrical cords out of the way.  Do not use floor polish or wax that makes floors slippery. If you must use wax, use non-skid floor wax.  Do not have throw rugs and other things on the floor that can make you trip. What can I do with my stairs?  Do  not leave any items on the stairs.  Make sure that there are handrails on both sides of the stairs and use them. Fix handrails that are broken or loose. Make sure that handrails are as long as the stairways.  Check any carpeting to make sure that it is firmly attached to the stairs. Fix any carpet that is loose or worn.  Avoid having throw rugs at the top or bottom of the stairs. If you do have throw rugs, attach them to the floor with carpet tape.  Make sure that you have a light switch at the top of the stairs and the bottom of the stairs. If you do not have them, ask someone to add them for you. What else can I do to help prevent falls?  Wear shoes that:  Do not have high heels.  Have rubber bottoms.  Are comfortable and fit you well.  Are closed at the toe. Do not wear sandals.  If you use a stepladder:  Make sure that it is fully opened. Do not climb a closed stepladder.  Make sure that both sides of the stepladder are locked into place.  Ask  someone to hold it for you, if possible.  Clearly mark and make sure that you can see:  Any grab bars or handrails.  First and last steps.  Where the edge of each step is.  Use tools that help you move around (mobility aids) if they are needed. These include:  Canes.  Walkers.  Scooters.  Crutches.  Turn on the lights when you go into a dark area. Replace any light bulbs as soon as they burn out.  Set up your furniture so you have a clear path. Avoid moving your furniture around.  If any of your floors are uneven, fix them.  If there are any pets around you, be aware of where they are.  Review your medicines with your doctor. Some medicines can make you feel dizzy. This can increase your chance of falling. Ask your doctor what other things that you can do to help prevent falls. This information is not intended to replace advice given to you by your health care provider. Make sure you discuss any questions you have with your health care provider. Document Released: 10/28/2008 Document Revised: 06/09/2015 Document Reviewed: 02/05/2014 Elsevier Interactive Patient Education  2017 [employer].

## 2016-08-23 ENCOUNTER — Encounter: Payer: Self-pay | Admitting: *Deleted

## 2016-08-23 LAB — BASIC METABOLIC PANEL
BUN: 18 mg/dL (ref 7–25)
CO2: 21 mmol/L (ref 20–32)
Calcium: 10 mg/dL (ref 8.6–10.4)
Chloride: 105 mmol/L (ref 98–110)
Creat: 0.9 mg/dL — ABNORMAL HIGH (ref 0.60–0.88)
Glucose, Bld: 100 mg/dL — ABNORMAL HIGH (ref 65–99)
Potassium: 4.8 mmol/L (ref 3.5–5.3)
Sodium: 138 mmol/L (ref 135–146)

## 2016-08-23 LAB — LIPID PANEL
Cholesterol: 121 mg/dL (ref <200)
HDL: 39 mg/dL — ABNORMAL LOW (ref >50)
LDL Cholesterol: 67 mg/dL (ref <100)
Total CHOL/HDL Ratio: 3.1 Ratio (ref <5.0)
Triglycerides: 77 mg/dL (ref <150)
VLDL: 15 mg/dL (ref <30)

## 2016-08-23 LAB — HEMOGLOBIN A1C
Hgb A1c MFr Bld: 5.1 % (ref <5.7)
Mean Plasma Glucose: 100 mg/dL

## 2016-08-24 ENCOUNTER — Encounter: Payer: Medicare Other | Admitting: Internal Medicine

## 2016-08-28 ENCOUNTER — Other Ambulatory Visit: Payer: Self-pay | Admitting: Internal Medicine

## 2016-08-28 DIAGNOSIS — R61 Generalized hyperhidrosis: Secondary | ICD-10-CM

## 2016-08-28 DIAGNOSIS — M48062 Spinal stenosis, lumbar region with neurogenic claudication: Secondary | ICD-10-CM

## 2016-08-28 DIAGNOSIS — M792 Neuralgia and neuritis, unspecified: Secondary | ICD-10-CM

## 2016-09-03 ENCOUNTER — Encounter: Payer: Self-pay | Admitting: Internal Medicine

## 2016-09-03 ENCOUNTER — Ambulatory Visit (INDEPENDENT_AMBULATORY_CARE_PROVIDER_SITE_OTHER): Payer: Medicare Other | Admitting: Internal Medicine

## 2016-09-03 VITALS — BP 124/64 | HR 70 | Temp 97.7°F | Wt 158.0 lb

## 2016-09-03 DIAGNOSIS — R61 Generalized hyperhidrosis: Secondary | ICD-10-CM

## 2016-09-03 DIAGNOSIS — M159 Polyosteoarthritis, unspecified: Secondary | ICD-10-CM | POA: Diagnosis not present

## 2016-09-03 DIAGNOSIS — M792 Neuralgia and neuritis, unspecified: Secondary | ICD-10-CM | POA: Diagnosis not present

## 2016-09-03 DIAGNOSIS — E039 Hypothyroidism, unspecified: Secondary | ICD-10-CM | POA: Diagnosis not present

## 2016-09-03 DIAGNOSIS — L74519 Primary focal hyperhidrosis, unspecified: Secondary | ICD-10-CM | POA: Diagnosis not present

## 2016-09-03 DIAGNOSIS — I10 Essential (primary) hypertension: Secondary | ICD-10-CM

## 2016-09-03 DIAGNOSIS — H353130 Nonexudative age-related macular degeneration, bilateral, stage unspecified: Secondary | ICD-10-CM | POA: Insufficient documentation

## 2016-09-03 DIAGNOSIS — Z636 Dependent relative needing care at home: Secondary | ICD-10-CM

## 2016-09-03 NOTE — Addendum Note (Signed)
Addended by: Bufford Spikes L on: 09/03/2016 10:00 AM   Modules accepted: Orders

## 2016-09-03 NOTE — Progress Notes (Signed)
Location:  [facility] clinic Provider:  Andee Chivers L. Renato Gails, D.O., C.M.D.  Code Status: DNR Goals of Care:  Advanced Directives 08/22/2016  Does Patient Have a Medical Advance Directive? Yes  Type of Estate agent of [place];Living will  Does patient want to make changes to medical advance directive? No - Patient declined  Copy of Healthcare Power of Attorney in Chart? Yes   Chief Complaint  Patient presents with  . Medical Management of Chronic Issues    follow-up    HPI: Patient is a 83 y.o. female seen today for medical management of chronic diseases.    Pt saw Huntley Dec, RN for her annual wellness last week.  She scored 29/30 on her MMSE.    She got her second shingrix day after her AWV.  Burning still happens in legs and arms, even in hips at times, sometimes encompasses her: On gabapentin.  Still gets the sweats after the pains.   Macular degeneration:  On preservision areds2.  Vision has been stable.  Saw ophtho in spring and goes to retina specialist next month (q 6 mos)  HTN:  On losartan 50mg  daily.  There is question about her bp readings.  Diastolic 80 this am which she reports is high for her.  Systolic has gone down.    Her weight has trended down to 158 lbs.  Last year, she weighed 170 lbs.  Mostly running around the house keeping track of her husband.    Her husband with dementia fell and broke his pelvis in July and is back home now.  He was in rehab all of July.  He's not wanting to use his walker.  Their daughter is watching him.  Had caregiver but she stole some of her items so she had to let her go.    Wants to wait until September for flu shot.    Past Medical History:  Diagnosis Date  . Hemorrhage of gastrointestinal tract, unspecified   . Hypertension   . Migraine   . OA (osteoarthritis)   . Thyroid cyst     Past Surgical History:  Procedure Laterality Date  . CATARACT EXTRACTION  1990   rt  . CATARACT EXTRACTION  2005   left  .  REVISION TOTAL KNEE ARTHROPLASTY  2008  . THUMB ARTHROSCOPY    . TONSILLECTOMY      No Known Allergies  Allergies as of 09/03/2016   No Known Allergies     Medication List       Accurate as of 09/03/16  9:18 AM. Always use your most recent med list.          Calcium Carbonate-Vitamin D 600-400 MG-UNIT tablet Take 2 tablets by mouth daily.   gabapentin 100 MG capsule Commonly known as:  NEURONTIN take 1 capsule by mouth at bedtime   losartan 50 MG tablet Commonly known as:  COZAAR take 1 tablet by mouth once daily   PRESERVISION AREDS 2 Caps Take by mouth. 1 by mouth twice daily   SYNTHROID 100 MCG tablet Generic drug:  levothyroxine take 1 tablet by mouth once daily       Review of Systems:  Review of Systems  Constitutional: Positive for diaphoresis. Negative for chills, fever and malaise/fatigue.  HENT: Negative for congestion.   Eyes: Negative for blurred vision.       Macular degeneration  Respiratory: Negative for cough and shortness of breath.   Cardiovascular: Negative for chest pain, palpitations and leg swelling.  Gastrointestinal: Negative  for abdominal pain, blood in stool, constipation and melena.  Genitourinary: Negative for dysuria.  Musculoskeletal: Positive for back pain and joint pain. Negative for falls.  Skin: Negative for itching and rash.  Neurological: Positive for tingling and sensory change. Negative for dizziness, loss of consciousness and weakness.  Endo/Heme/Allergies: Bruises/bleeds easily.  Psychiatric/Behavioral: Negative for depression and memory loss. The patient does not have insomnia.     Health Maintenance  Topic Date Due  . INFLUENZA VACCINE  08/15/2016  . DEXA SCAN  01/15/2018 (Originally 01/04/1995)  . TETANUS/TDAP  08/28/2023  . PNA vac Low Risk Adult  Completed    Physical Exam: Vitals:   09/03/16 0911  BP: 128/80  Pulse: 70  Temp: 97.7 F (36.5 C)  TempSrc: Oral  SpO2: 97%  Weight: 158 lb (71.7 kg)    Body mass index is 27.99 kg/m. Physical Exam  Constitutional: She is oriented to person, place, and time. She appears well-developed and well-nourished. No distress.  Cardiovascular: Normal rate and regular rhythm.   Murmur heard. Pulmonary/Chest: Effort normal and breath sounds normal. No respiratory distress.  Abdominal: Bowel sounds are normal.  Musculoskeletal: Normal range of motion.  Neurological: She is alert and oriented to person, place, and time.  Skin: Skin is warm and dry.  Psychiatric: She has a normal mood and affect.    Labs reviewed: Basic Metabolic Panel:  Recent Labs  15/83/09 0910  NA 138  K 4.8  CL 105  CO2 21  GLUCOSE 100*  BUN 18  CREATININE 0.90*  CALCIUM 10.0  TSH 1.40   Liver Function Tests: No results for input(s): AST, ALT, ALKPHOS, BILITOT, PROT, ALBUMIN in the last 8760 hours. No results for input(s): LIPASE, AMYLASE in the last 8760 hours. No results for input(s): AMMONIA in the last 8760 hours. CBC: No results for input(s): WBC, NEUTROABS, HGB, HCT, MCV, PLT in the last 8760 hours. Lipid Panel:  Recent Labs  08/22/16 0910  CHOL 121  HDL 39*  LDLCALC 67  TRIG 77  CHOLHDL 3.1   Lab Results  Component Value Date   HGBA1C 5.1 08/22/2016    Assessment/Plan 1. Essential hypertension, benign -bp at goal with losartan--cont same dose  2. Hypothyroidism, unspecified type -cont levothyroxine current dose Lab Results  Component Value Date   TSH 1.40 08/22/2016   3. Generalized osteoarthritis of multiple sites -cont prn ibuprofen which she takes no more than 2x per week (due to prior gi bleed) -cont to be active to keep joints lubricated  4. Neuropathic pain -remains unusual, takes low dose gabapentin for this and spinal stenosis - Vitamin B12  5. Hyperhidrosis - unusual symptoms occur after burning pain in extremities -prior cortisol and serotonin labs normal, check b12 - Vitamin B12  6. Caregiver stress -ongoing,  suspect maybe her unusual symptoms are stress and anxiety related  7. Bilateral nonexudative age-related macular degeneration, unspecified stage -keep f/u with retina specialist  To call back for flu shot in September.  Labs/tests ordered:   Orders Placed This Encounter  Procedures  . Vitamin B12    Next appt: 6 mos med mgt   Thorsten Climer L. Kennede Lusk, D.O. Geriatrics [facility] [address], [place] [address] Cell Phone (Mon-Fri 8am-5pm):  [phone] On Call:  [phone] & follow prompts after 5pm & weekends Office Phone:  [phone] Office Fax:  [phone]

## 2016-09-04 ENCOUNTER — Encounter: Payer: Self-pay | Admitting: *Deleted

## 2016-09-04 LAB — VITAMIN B12: Vitamin B-12: 364 pg/mL (ref 200–1100)

## 2016-09-11 ENCOUNTER — Ambulatory Visit
Admission: RE | Admit: 2016-09-11 | Discharge: 2016-09-11 | Disposition: A | Discharge status: Home / Self Care | Payer: Medicare Other | Source: Ambulatory Visit | Attending: Internal Medicine | Admitting: Internal Medicine

## 2016-09-11 DIAGNOSIS — Z1231 Encounter for screening mammogram for malignant neoplasm of breast: Secondary | ICD-10-CM

## 2016-09-22 ENCOUNTER — Other Ambulatory Visit: Payer: Self-pay | Admitting: Internal Medicine

## 2016-09-22 DIAGNOSIS — I10 Essential (primary) hypertension: Secondary | ICD-10-CM

## 2016-12-19 ENCOUNTER — Other Ambulatory Visit: Payer: Self-pay | Admitting: Internal Medicine

## 2016-12-19 DIAGNOSIS — E041 Nontoxic single thyroid nodule: Secondary | ICD-10-CM

## 2016-12-19 DIAGNOSIS — E038 Other specified hypothyroidism: Secondary | ICD-10-CM

## 2017-01-01 ENCOUNTER — Other Ambulatory Visit: Payer: Self-pay | Admitting: Internal Medicine

## 2017-01-01 DIAGNOSIS — I10 Essential (primary) hypertension: Secondary | ICD-10-CM

## 2017-02-21 NOTE — Addendum Note (Signed)
Addended by: Dayanne Yiu L on: 02/21/2017 04:14 PM   Modules accepted: Orders  

## 2017-03-05 ENCOUNTER — Other Ambulatory Visit: Payer: Medicare Other

## 2017-03-05 DIAGNOSIS — E039 Hypothyroidism, unspecified: Secondary | ICD-10-CM

## 2017-03-05 DIAGNOSIS — I10 Essential (primary) hypertension: Secondary | ICD-10-CM

## 2017-03-05 LAB — LIPID PANEL
Cholesterol: 115 mg/dL (ref <200)
HDL: 33 mg/dL — ABNORMAL LOW (ref >50)
LDL Cholesterol (Calc): 64 mg/dL (calc)
Non-HDL Cholesterol (Calc): 82 mg/dL (calc) (ref <130)
Total CHOL/HDL Ratio: 3.5 (calc) (ref <5.0)
Triglycerides: 97 mg/dL (ref <150)

## 2017-03-05 LAB — CBC WITH DIFFERENTIAL/PLATELET
Basophils Absolute: 29 cells/uL (ref 0–200)
Basophils Relative: 0.7 %
Eosinophils Absolute: 230 cells/uL (ref 15–500)
Eosinophils Relative: 5.6 %
HCT: 39.3 % (ref 35.0–45.0)
Hemoglobin: 13.6 g/dL (ref 11.7–15.5)
Lymphs Abs: 1021 cells/uL (ref 850–3900)
MCH: 30.3 pg (ref 27.0–33.0)
MCHC: 34.6 g/dL (ref 32.0–36.0)
MCV: 87.5 fL (ref 80.0–100.0)
MPV: 10 fL (ref 7.5–12.5)
Monocytes Relative: 10.5 %
Neutro Abs: 2390 cells/uL (ref 1500–7800)
Neutrophils Relative %: 58.3 %
Platelets: 172 10*3/uL (ref 140–400)
RBC: 4.49 10*6/uL (ref 3.80–5.10)
RDW: 12.6 % (ref 11.0–15.0)
Total Lymphocyte: 24.9 %
WBC mixed population: 431 cells/uL (ref 200–950)
WBC: 4.1 10*3/uL (ref 3.8–10.8)

## 2017-03-05 LAB — COMPLETE METABOLIC PANEL WITH GFR
AG Ratio: 2.5 (calc) (ref 1.0–2.5)
ALT: 15 U/L (ref 6–29)
AST: 15 U/L (ref 10–35)
Albumin: 4.2 g/dL (ref 3.6–5.1)
Alkaline phosphatase (APISO): 42 U/L (ref 33–130)
BUN/Creatinine Ratio: 21 (calc) (ref 6–22)
BUN: 19 mg/dL (ref 7–25)
CO2: 28 mmol/L (ref 20–32)
Calcium: 10 mg/dL (ref 8.6–10.4)
Chloride: 103 mmol/L (ref 98–110)
Creat: 0.9 mg/dL — ABNORMAL HIGH (ref 0.60–0.88)
GFR, Est African American: 67 mL/min/{1.73_m2} (ref >=60)
GFR, Est Non African American: 57 mL/min/{1.73_m2} — ABNORMAL LOW (ref >=60)
Globulin: 1.7 g/dL (calc) — ABNORMAL LOW (ref 1.9–3.7)
Glucose, Bld: 88 mg/dL (ref 65–99)
Potassium: 4.7 mmol/L (ref 3.5–5.3)
Sodium: 136 mmol/L (ref 135–146)
Total Bilirubin: 0.5 mg/dL (ref 0.2–1.2)
Total Protein: 5.9 g/dL — ABNORMAL LOW (ref 6.1–8.1)

## 2017-03-05 LAB — TSH: TSH: 0.98 mIU/L (ref 0.40–4.50)

## 2017-03-07 ENCOUNTER — Encounter: Payer: Self-pay | Admitting: Internal Medicine

## 2017-03-07 ENCOUNTER — Ambulatory Visit: Payer: Medicare Other | Admitting: Internal Medicine

## 2017-03-07 VITALS — BP 130/68 | HR 54 | Temp 97.7°F | Wt 157.0 lb

## 2017-03-07 DIAGNOSIS — R61 Generalized hyperhidrosis: Secondary | ICD-10-CM | POA: Diagnosis not present

## 2017-03-07 DIAGNOSIS — Z636 Dependent relative needing care at home: Secondary | ICD-10-CM | POA: Diagnosis not present

## 2017-03-07 DIAGNOSIS — H353130 Nonexudative age-related macular degeneration, bilateral, stage unspecified: Secondary | ICD-10-CM | POA: Diagnosis not present

## 2017-03-07 DIAGNOSIS — M792 Neuralgia and neuritis, unspecified: Secondary | ICD-10-CM

## 2017-03-07 DIAGNOSIS — E039 Hypothyroidism, unspecified: Secondary | ICD-10-CM

## 2017-03-07 DIAGNOSIS — I10 Essential (primary) hypertension: Secondary | ICD-10-CM

## 2017-03-07 DIAGNOSIS — M159 Polyosteoarthritis, unspecified: Secondary | ICD-10-CM

## 2017-03-07 MED ORDER — VITAMIN B-12 1000 MCG PO TABS: 1000.0000 ug | ORAL_TABLET | Freq: Every day | ORAL | 5 refills | Status: DC

## 2017-03-07 MED ORDER — PREGABALIN 50 MG PO CAPS: 50.0000 mg | ORAL_CAPSULE | Freq: Every day | ORAL | 0 refills | Status: DC

## 2017-03-07 MED ORDER — LOSARTAN POTASSIUM 50 MG PO TABS: 50.0000 mg | ORAL_TABLET | Freq: Every day | ORAL | 3 refills | Status: DC

## 2017-03-07 NOTE — Progress Notes (Signed)
Location:  [facility] clinic Provider:  Deshay Blumenfeld L. Renato Gails, D.O., C.M.D.  Code Status: full code--need to discuss next appt Goals of Care:  Advanced Directives 03/07/2017  Does Patient Have a Medical Advance Directive? Yes  Type of Advance Directive Healthcare Power of Attorney  Does patient want to make changes to medical advance directive? No - Patient declined  Copy of Healthcare Power of Attorney in Chart? Yes     Chief Complaint  Patient presents with  . Medical Management of Chronic Issues    follow-up  . ACP    HCPOA    HPI: Patient is a 84 y.o. female seen today for medical management of chronic diseases.  Says she is doing ok.    HTN:  bp controlled.  Is on losartan.  Wishes she didn't need to take them.  Low back pain:  Gabapentin quit helping and gave her bad dreams.  Mostly, bad dreams did go away.  Back still does hurt.  Says she's old and that goes with it.  If she's not on her feet a lot and she does not have to walk a lot, it doesn't bother her, but then it hurts at night, in bed.  Still has restless leg syndrome.  Every once in a while, she takes an ibuprofen.  Tylenol has not helped.  Doesn't like to take anything ongoing.    Hypothyroidism:  TSH wnl.  On levothyroxine.   Lipids at goal.  Kidneys stable. Blood counts normal.   Neuropathic pain still bothersome at night--it is every night and even some in the daytime--she'll have to throw the covers off and that's when she perspires.  She's been on b12 and it did not make her list.  Vision remains stable.    Caregiver stress persists.  His memory decline has gotten to where he doesn't recognize the grandkids--4.   He knows they are someone he loves.  Says she has enough support--one daughter helps who lives in town and oldest granddaughter is helping.  Has agency to help if needed.    Past Medical History:  Diagnosis Date  . Hemorrhage of gastrointestinal tract, unspecified   . Hypertension   . Migraine    . OA (osteoarthritis)   . Thyroid cyst     Past Surgical History:  Procedure Laterality Date  . CATARACT EXTRACTION  1990   rt  . CATARACT EXTRACTION  2005   left  . REVISION TOTAL KNEE ARTHROPLASTY  2008  . THUMB ARTHROSCOPY    . TONSILLECTOMY      No Known Allergies  Outpatient Encounter Medications as of 03/07/2017  Medication Sig  . Calcium Carbonate-Vitamin D 600-400 MG-UNIT per tablet Take 2 tablets by mouth daily.  Marland Kitchen losartan (COZAAR) 50 MG tablet take 1 tablet by mouth once daily  . Multiple Vitamins-Minerals (PRESERVISION AREDS 2) CAPS Take by mouth. 1 by mouth twice daily  . SYNTHROID 100 MCG tablet take 1 tablet by mouth once daily  . [DISCONTINUED] gabapentin (NEURONTIN) 100 MG capsule take 1 capsule by mouth at bedtime   No facility-administered encounter medications on file as of 03/07/2017.     Review of Systems:  Review of Systems  Constitutional: Negative for chills, fever and malaise/fatigue.  HENT: Negative for congestion and hearing loss.   Eyes: Negative for blurred vision.  Respiratory: Negative for cough and shortness of breath.   Cardiovascular: Negative for chest pain, palpitations and leg swelling.  Gastrointestinal: Negative for abdominal pain, blood in stool, constipation  and melena.  Genitourinary: Negative for dysuria.  Musculoskeletal: Positive for back pain. Negative for falls and joint pain.  Skin: Negative for itching and rash.  Neurological: Negative for dizziness, loss of consciousness and weakness.  Endo/Heme/Allergies: Bruises/bleeds easily.       Sweats after neuropathic pain episodes at night and sometimes during the day  Psychiatric/Behavioral: Negative for depression and memory loss. The patient is not nervous/anxious and does not have insomnia.     Health Maintenance  Topic Date Due  . DEXA SCAN  01/15/2018 (Originally 01/04/1995)  . TETANUS/TDAP  08/28/2023  . INFLUENZA VACCINE  Completed  . PNA vac Low Risk Adult   Completed    Physical Exam: Vitals:   03/07/17 1042  BP: 130/68  Pulse: (!) 54  Temp: 97.7 F (36.5 C)  TempSrc: Oral  SpO2: 96%  Weight: 157 lb (71.2 kg)   Body mass index is 27.81 kg/m. Physical Exam  Constitutional: She is oriented to person, place, and time. She appears well-developed and well-nourished. No distress.  HENT:  Head: Normocephalic and atraumatic.  Cardiovascular: Normal rate, regular rhythm, normal heart sounds and intact distal pulses.  Pulmonary/Chest: Effort normal and breath sounds normal. No respiratory distress.  Abdominal: Soft. Bowel sounds are normal. She exhibits no distension. There is no tenderness.  Musculoskeletal: Normal range of motion.  Neurological: She is alert and oriented to person, place, and time.  Skin: Skin is warm and dry. Capillary refill takes less than 2 seconds.  Psychiatric: She has a normal mood and affect.    Labs reviewed: Basic Metabolic Panel: Recent Labs    08/22/16 0910 03/05/17 0926  NA 138 136  K 4.8 4.7  CL 105 103  CO2 21 28  GLUCOSE 100* 88  BUN 18 19  CREATININE 0.90* 0.90*  CALCIUM 10.0 10.0  TSH 1.40 0.98   Liver Function Tests: Recent Labs    03/05/17 0926  AST 15  ALT 15  BILITOT 0.5  PROT 5.9*   No results for input(s): LIPASE, AMYLASE in the last 8760 hours. No results for input(s): AMMONIA in the last 8760 hours. CBC: Recent Labs    03/05/17 0926  WBC 4.1  NEUTROABS 2,390  HGB 13.6  HCT 39.3  MCV 87.5  PLT 172   Lipid Panel: Recent Labs    08/22/16 0910 03/05/17 0926  CHOL 121 115  HDL 39* 33*  LDLCALC 67  --   TRIG 77 97  CHOLHDL 3.1 3.5   Lab Results  Component Value Date   HGBA1C 5.1 08/22/2016    Assessment/Plan 1. Essential hypertension, benign -bp at goal, cont current regimen--explained the medication is why her bp is good - CBC with Differential/Platelet; Future - Basic metabolic panel; Future - losartan (COZAAR) 50 MG tablet; Take 1 tablet (50 mg total)  by mouth daily.  Dispense: 90 tablet; Refill: 3  2. Hypothyroidism, unspecified type -tsh was wnl and continue levothyroxine - TSH; Future  3. Generalized osteoarthritis of multiple sites -cont prn ibuprofen rarely -back is primary area  4. Neuropathic pain - did not tolerate gabapentin due to odd nightmares that she could not remember - pregabalin (LYRICA) 50 MG capsule; Take 1 capsule (50 mg total) by mouth at bedtime.  Dispense: 90 capsule; Refill: 0  5. Caregiver stress -continues, but does now have her granddaughter living with her to help care for her husband who has advanced dementia  6. Hyperhidrosis -related to neuropathic pain--episodes occur after the pain   7. Bilateral  nonexudative age-related macular degeneration, unspecified stage -stable lately, no new visual changes  Labs/tests ordered:   Orders Placed This Encounter  Procedures  . CBC with Differential/Platelet    Standing Status:   Future    Standing Expiration Date:   03/07/2018  . Basic metabolic panel    Standing Status:   Future    Standing Expiration Date:   03/07/2018    Order Specific Question:   Has the patient fasted?    Answer:   Yes  . TSH    Standing Status:   Future    Standing Expiration Date:   03/07/2018    Next appt:  6 mos annual, labs before  Louay Myrie L. Mykell Genao, D.O. Geriatrics [facility] [address] [place], [place] [address] Cell Phone (Mon-Fri 8am-5pm):  [phone] On Call:  [phone] & follow prompts after 5pm & weekends Office Phone:  [phone] Office Fax:  [phone]

## 2017-03-07 NOTE — Patient Instructions (Signed)
Try lyrica 50mg  daily for burning pain and restless legs.

## 2017-03-19 ENCOUNTER — Encounter: Payer: Self-pay | Admitting: Internal Medicine

## 2017-04-15 ENCOUNTER — Telehealth: Payer: Self-pay

## 2017-04-15 DIAGNOSIS — M792 Neuralgia and neuritis, unspecified: Secondary | ICD-10-CM

## 2017-04-15 MED ORDER — PREGABALIN 50 MG PO CAPS: 50.0000 mg | ORAL_CAPSULE | Freq: Every day | ORAL | 1 refills | Status: DC

## 2017-04-15 NOTE — Telephone Encounter (Signed)
Patient called to inform Dr.Reed Lyrica samples were given and are effective. [facility]   RX sent (manually) as requested.

## 2017-08-07 ENCOUNTER — Other Ambulatory Visit: Payer: Self-pay | Admitting: Internal Medicine

## 2017-08-07 DIAGNOSIS — E038 Other specified hypothyroidism: Secondary | ICD-10-CM

## 2017-08-07 DIAGNOSIS — E041 Nontoxic single thyroid nodule: Secondary | ICD-10-CM

## 2017-08-20 ENCOUNTER — Other Ambulatory Visit: Payer: Self-pay | Admitting: Internal Medicine

## 2017-08-20 DIAGNOSIS — Z1231 Encounter for screening mammogram for malignant neoplasm of breast: Secondary | ICD-10-CM

## 2017-09-11 ENCOUNTER — Other Ambulatory Visit: Payer: Self-pay

## 2017-09-11 ENCOUNTER — Ambulatory Visit (INDEPENDENT_AMBULATORY_CARE_PROVIDER_SITE_OTHER): Payer: Medicare Other

## 2017-09-11 ENCOUNTER — Other Ambulatory Visit: Payer: Medicare Other

## 2017-09-11 VITALS — BP 138/62 | HR 70 | Temp 97.8°F | Ht 63.0 in | Wt 158.0 lb

## 2017-09-11 DIAGNOSIS — Z0000 Encounter for general adult medical examination without abnormal findings: Secondary | ICD-10-CM

## 2017-09-11 DIAGNOSIS — I10 Essential (primary) hypertension: Secondary | ICD-10-CM

## 2017-09-11 DIAGNOSIS — E039 Hypothyroidism, unspecified: Secondary | ICD-10-CM

## 2017-09-11 NOTE — Progress Notes (Signed)
Subjective:   Donna [name] is a 84 y.o. female who presents for Medicare Annual (Subsequent) preventive examination.  Last AWV-08/22/2016    Objective:     Vitals: BP 138/62 (BP Location: Left Arm, Patient Position: Sitting)   Pulse 70   Temp 97.8 F (36.6 C) (Oral)   Ht 5\' 3"  (1.6 m)   Wt 158 lb (71.7 kg)   SpO2 96%   BMI 27.99 kg/m   Body mass index is 27.99 kg/m.  Advanced Directives 09/11/2017 03/07/2017 08/22/2016 08/19/2015 08/13/2014  Does Patient Have a Medical Advance Directive? Yes Yes Yes Yes Yes  Type of Sales promotion account executive of [employer] Power of [employer];Living will Healthcare Power of [employer] Power of Attorney  Does patient want to make changes to medical advance directive? No - Patient declined No - Patient declined No - Patient declined - -  Copy of Healthcare Power of Attorney in Chart? Yes Yes Yes Yes Yes    Tobacco Social History   Tobacco Use  Smoking Status Former Smoker  . Years: 2.00  . Types: Cigarettes  . Last attempt to quit: 01/16/1948  . Years since quitting: 69.7  Smokeless Tobacco Never Used  Tobacco Comment   smoked on occ when she was a teen     Counseling given: Not Answered Comment: smoked on occ when she was a teen   Clinical Intake:  Pre-visit preparation completed: No  Pain : No/denies pain     Nutritional Risks: None Diabetes: No  How often do you need to have someone help you when you read instructions, pamphlets, or other written materials from your doctor or pharmacy?: 1 - Never What is the last grade level you completed in school?: [employer]  Interpreter Needed?: No  Information entered by :: Tyron Russell, RN  Past Medical History:  Diagnosis Date  . Hemorrhage of gastrointestinal tract, unspecified   . Hypertension   . Migraine   . OA (osteoarthritis)   . Thyroid cyst    Past Surgical History:  Procedure Laterality Date  . CATARACT EXTRACTION  1990   rt  . CATARACT EXTRACTION  2005   left  . REVISION TOTAL KNEE ARTHROPLASTY  2008  . THUMB ARTHROSCOPY    . TONSILLECTOMY     Family History  Problem Relation Age of Onset  . CVA Father 54  . Pancreatic cancer Mother   . CVA Sister 16  . Brain cancer Brother   . Arthritis Sister   . Breast cancer Neg Hx    Social History   Socioeconomic History  . Marital status: Married    Spouse name: Not on file  . Number of children: Not on file  . Years of education: Not on file  . Highest education level: Not on file  Occupational History  . Not on file  Social Needs  . Financial resource strain: Not hard at all  . Food insecurity:    Worry: Never true    Inability: Never true  . Transportation needs:    Medical: No    Non-medical: No  Tobacco Use  . Smoking status: Former Smoker    Years: 2.00    Types: Cigarettes    Last attempt to quit: 01/16/1948    Years since quitting: 69.7  . Smokeless tobacco: Never Used  . Tobacco comment: smoked on occ when she was a teen  Substance and Sexual Activity  . Alcohol use: Not Currently    Comment:  rarely has a glass of wine  . Drug use: No  . Sexual activity: Not on file  Lifestyle  . Physical activity:    Days per week: 0 days    Minutes per session: 0 min  . Stress: To some extent  Relationships  . Social connections:    Talks on phone: More than three times a week    Gets together: More than three times a week    Attends religious service: More than 4 times per year    Active member of club or organization: No    Attends meetings of clubs or organizations: Never    Relationship status: Married  Other Topics Concern  . Not on file  Social History Narrative  . Not on file    Outpatient Encounter Medications as of 09/11/2017  Medication Sig  . Calcium Carbonate-Vitamin D 600-400 MG-UNIT per tablet Take 2 tablets by mouth daily.  Marland Kitchen. losartan (COZAAR) 50 MG tablet Take 1 tablet (50 mg total) by mouth daily.  . Multiple  Vitamins-Minerals (PRESERVISION AREDS 2) CAPS Take by mouth. 1 by mouth twice daily  . pregabalin (LYRICA) 50 MG capsule Take 1 capsule (50 mg total) by mouth at bedtime.  Marland Kitchen. SYNTHROID 100 MCG tablet TAKE 1 TABLET BY MOUTH ONCE DAILY  . vitamin B-12 (CYANOCOBALAMIN) 1000 MCG tablet Take 1 tablet (1,000 mcg total) by mouth daily.   No facility-administered encounter medications on file as of 09/11/2017.     Activities of Daily Living In your present state of health, do you have any difficulty performing the following activities: 09/11/2017  Hearing? N  Vision? N  Difficulty concentrating or making decisions? N  Walking or climbing stairs? N  Dressing or bathing? N  Doing errands, shopping? N  Preparing Food and eating ? N  Using the Toilet? N  In the past six months, have you accidently leaked urine? N  Do you have problems with loss of bowel control? N  Managing your Medications? N  Managing your Finances? N  Housekeeping or managing your Housekeeping? N  Some recent data might be hidden    Patient Care Team: Kermit Baloeed, Tiffany L, DO as PCP - General (Geriatric Medicine) Everardo PacificPatel, Niyati, OD as Consulting Physician (Optometry) Awilda BillPollock, Frank E., MD as Referring Physician (Orthopedic Surgery)    Assessment:   This is a routine wellness examination for Donna [name].  Exercise Activities and Dietary recommendations Current Exercise Habits: The patient does not participate in regular exercise at present, Exercise limited by: None identified  Goals    . Maintain self care     Patient will maintain self care       Fall Risk Fall Risk  09/11/2017 08/22/2016 08/19/2015 08/13/2014 08/06/2013  Falls in the past year? Yes No No No No  Number falls in past yr: 1 - - - -  Injury with Fall? No - - - -   Is the patient's home free of loose throw rugs in walkways, pet beds, electrical cords, etc?   yes      Grab bars in the bathroom? yes      Handrails on the stairs?   yes      Adequate lighting?    yes  Timed Get Up and Go performed: 16 seconds  Depression Screen PHQ 2/9 Scores 09/11/2017 08/22/2016 08/19/2015 08/13/2014  PHQ - 2 Score 0 0 0 0     Cognitive Function MMSE - Mini Mental State Exam 09/11/2017 08/22/2016 08/19/2015 08/13/2014 08/06/2013  Orientation to time  5 5 5 5 5   Orientation to Place 5 5 5 5 5   Registration 3 3 3 3 3   Attention/ Calculation 5 5 5 5 5   Recall 3 2 3 2 3   Language- name 2 objects 2 2 2 2 2   Language- repeat 1 1 1 1 1   Language- follow 3 step command 3 3 3 3 3   Language- read & follow direction 1 1 1 1 1   Write a sentence 1 1 1 1 1   Copy design 1 1 1 1 1   Total score 30 29 30 29 30         Immunization History  Administered Date(s) Administered  . Influenza Whole 10/16/2011  . Influenza,inj,Quad PF,6+ Mos 09/21/2015  . Influenza-Unspecified 10/22/2013, 10/01/2014, 09/26/2016  . Pneumococcal Conjugate-13 08/13/2014  . Pneumococcal Polysaccharide-23 08/19/2015  . Tdap 08/27/2013  . Zoster 01/16/2011  . Zoster Recombinat (Shingrix) 05/29/2016, 08/23/2016    Qualifies for Shingles Vaccine? Up to date, completed  Screening Tests Health Maintenance  Topic Date Due  . INFLUENZA VACCINE  08/15/2017  . DEXA SCAN  01/15/2018 (Originally 01/04/1995)  . TETANUS/TDAP  08/28/2023  . PNA vac Low Risk Adult  Completed    Cancer Screenings: Lung: Low Dose CT Chest recommended if Age 64-80 years, 30 pack-year currently smoking OR have quit w/in 15years. Patient does not qualify. Breast:  Up to date on Mammogram? Yes   Up to date of Bone Density/Dexa? Yes, Patient got one done before she came to this practice.  Colorectal: up to date  Additional Screenings:  Hepatitis C Screening: declined Flu vaccine due: patient wants to get high dose    Plan:    I have personally reviewed and addressed the Medicare Annual Wellness questionnaire and have noted the following in the patient's chart:  A. Medical and social history B. Use of alcohol, tobacco or illicit  drugs  C. Current medications and supplements D. Functional ability and status E.  Nutritional status F.  Physical activity G. Advance directives H. List of other physicians I.  Hospitalizations, surgeries, and ER visits in previous 12 months J.  Vitals K. Screenings to include hearing, vision, cognitive, depression L. Referrals and appointments - none  In addition, I have reviewed and discussed with patient certain preventive protocols, quality metrics, and best practice recommendations. A written personalized care plan for preventive services as well as general preventive health recommendations were provided to patient.  See attached scanned questionnaire for additional information.   Signed,   Tyron Russell, RN Nurse Health Advisor  Patient Concerns: None

## 2017-09-11 NOTE — Patient Instructions (Signed)
[name] , Thank you for taking time to come for your Medicare Wellness Visit. I appreciate your ongoing commitment to your health goals. Please review the following plan we discussed and let me know if I can assist you in the future.   Screening recommendations/referrals: Colonoscopy excluded, over age 82 Mammogram excluded, over age 82 Bone Density due Recommended yearly ophthalmology/optometry visit for glaucoma screening and checkup Recommended yearly dental visit for hygiene and checkup  Vaccinations: Influenza vaccine due, please get high dose at the pharmacy Pneumococcal vaccine up to date, completed Tdap vaccine up to date, due 08/28/2023 Shingles vaccine up to date, completed    Advanced directives: in chart  Conditions/risks identified: none  Next appointment: Dr. Renato Gailseed 10/03/2017 @ 9am             Tyron RussellSara Saunders, RN 09/15/2018 @ 9:15am   Preventive Care 65 Years and Older, Female Preventive care refers to lifestyle choices and visits with your health care provider that can promote health and wellness. What does preventive care include?  A yearly physical exam. This is also called an annual well check.  Dental exams once or twice a year.  Routine eye exams. Ask your health care provider how often you should have your eyes checked.  Personal lifestyle choices, including:  Daily care of your teeth and gums.  Regular physical activity.  Eating a healthy diet.  Avoiding tobacco and drug use.  Limiting alcohol use.  Practicing safe sex.  Taking low-dose aspirin every day.  Taking vitamin and mineral supplements as recommended by your health care provider. What happens during an annual well check? The services and screenings done by your health care provider during your annual well check will depend on your age, overall health, lifestyle risk factors, and family history of disease. Counseling  Your health care provider may ask you questions about your:  Alcohol  use.  Tobacco use.  Drug use.  Emotional well-being.  Home and relationship well-being.  Sexual activity.  Eating habits.  History of falls.  Memory and ability to understand (cognition).  Work and work Astronomerenvironment.  Reproductive health. Screening  You may have the following tests or measurements:  Height, weight, and BMI.  Blood pressure.  Lipid and cholesterol levels. These may be checked every 5 years, or more frequently if you are over 82 years old.  Skin check.  Lung cancer screening. You may have this screening every year starting at age 82 if you have a 30-pack-year history of smoking and currently smoke or have quit within the past 15 years.  Fecal occult blood test (FOBT) of the stool. You may have this test every year starting at age 82.  Flexible sigmoidoscopy or colonoscopy. You may have a sigmoidoscopy every 5 years or a colonoscopy every 10 years starting at age 82.  Hepatitis C blood test.  Hepatitis B blood test.  Sexually transmitted disease (STD) testing.  Diabetes screening. This is done by checking your blood sugar (glucose) after you have not eaten for a while (fasting). You may have this done every 1-3 years.  Bone density scan. This is done to screen for osteoporosis. You may have this done starting at age 82.  Mammogram. This may be done every 1-2 years. Talk to your health care provider about how often you should have regular mammograms. Talk with your health care provider about your test results, treatment options, and if necessary, the need for more tests. Vaccines  Your health care provider may recommend certain  vaccines, such as:  Influenza vaccine. This is recommended every year.  Tetanus, diphtheria, and acellular pertussis (Tdap, Td) vaccine. You may need a Td booster every 10 years.  Zoster vaccine. You may need this after age 54.  Pneumococcal 13-valent conjugate (PCV13) vaccine. One dose is recommended after age  55.  Pneumococcal polysaccharide (PPSV23) vaccine. One dose is recommended after age 90. Talk to your health care provider about which screenings and vaccines you need and how often you need them. This information is not intended to replace advice given to you by your health care provider. Make sure you discuss any questions you have with your health care provider. Document Released: 01/28/2015 Document Revised: 09/21/2015 Document Reviewed: 11/02/2014 Elsevier Interactive Patient Education  2017 [employer].  Fall Prevention in the Home Falls can cause injuries. They can happen to people of all ages. There are many things you can do to make your home safe and to help prevent falls. What can I do on the outside of my home?  Regularly fix the edges of walkways and driveways and fix any cracks.  Remove anything that might make you trip as you walk through a door, such as a raised step or threshold.  Trim any bushes or trees on the path to your home.  Use bright outdoor lighting.  Clear any walking paths of anything that might make someone trip, such as rocks or tools.  Regularly check to see if handrails are loose or broken. Make sure that both sides of any steps have handrails.  Any raised decks and porches should have guardrails on the edges.  Have any leaves, snow, or ice cleared regularly.  Use sand or salt on walking paths during winter.  Clean up any spills in your garage right away. This includes oil or grease spills. What can I do in the bathroom?  Use night lights.  Install grab bars by the toilet and in the tub and shower. Do not use towel bars as grab bars.  Use non-skid mats or decals in the tub or shower.  If you need to sit down in the shower, use a plastic, non-slip stool.  Keep the floor dry. Clean up any water that spills on the floor as soon as it happens.  Remove soap buildup in the tub or shower regularly.  Attach bath mats securely with double-sided  non-slip rug tape.  Do not have throw rugs and other things on the floor that can make you trip. What can I do in the bedroom?  Use night lights.  Make sure that you have a light by your bed that is easy to reach.  Do not use any sheets or blankets that are too big for your bed. They should not hang down onto the floor.  Have a firm chair that has side arms. You can use this for support while you get dressed.  Do not have throw rugs and other things on the floor that can make you trip. What can I do in the kitchen?  Clean up any spills right away.  Avoid walking on wet floors.  Keep items that you use a lot in easy-to-reach places.  If you need to reach something above you, use a strong step stool that has a grab bar.  Keep electrical cords out of the way.  Do not use floor polish or wax that makes floors slippery. If you must use wax, use non-skid floor wax.  Do not have throw rugs and other things  on the floor that can make you trip. What can I do with my stairs?  Do not leave any items on the stairs.  Make sure that there are handrails on both sides of the stairs and use them. Fix handrails that are broken or loose. Make sure that handrails are as long as the stairways.  Check any carpeting to make sure that it is firmly attached to the stairs. Fix any carpet that is loose or worn.  Avoid having throw rugs at the top or bottom of the stairs. If you do have throw rugs, attach them to the floor with carpet tape.  Make sure that you have a light switch at the top of the stairs and the bottom of the stairs. If you do not have them, ask someone to add them for you. What else can I do to help prevent falls?  Wear shoes that:  Do not have high heels.  Have rubber bottoms.  Are comfortable and fit you well.  Are closed at the toe. Do not wear sandals.  If you use a stepladder:  Make sure that it is fully opened. Do not climb a closed stepladder.  Make sure that both  sides of the stepladder are locked into place.  Ask someone to hold it for you, if possible.  Clearly mark and make sure that you can see:  Any grab bars or handrails.  First and last steps.  Where the edge of each step is.  Use tools that help you move around (mobility aids) if they are needed. These include:  Canes.  Walkers.  Scooters.  Crutches.  Turn on the lights when you go into a dark area. Replace any light bulbs as soon as they burn out.  Set up your furniture so you have a clear path. Avoid moving your furniture around.  If any of your floors are uneven, fix them.  If there are any pets around you, be aware of where they are.  Review your medicines with your doctor. Some medicines can make you feel dizzy. This can increase your chance of falling. Ask your doctor what other things that you can do to help prevent falls. This information is not intended to replace advice given to you by your health care provider. Make sure you discuss any questions you have with your health care provider. Document Released: 10/28/2008 Document Revised: 06/09/2015 Document Reviewed: 02/05/2014 Elsevier Interactive Patient Education  2017 [employer].

## 2017-09-12 LAB — CBC WITH DIFFERENTIAL/PLATELET
Basophils Absolute: 28 cells/uL (ref 0–200)
Basophils Relative: 0.7 %
Eosinophils Absolute: 128 cells/uL (ref 15–500)
Eosinophils Relative: 3.2 %
HCT: 40.4 % (ref 35.0–45.0)
Hemoglobin: 13.8 g/dL (ref 11.7–15.5)
Lymphs Abs: 1108 cells/uL (ref 850–3900)
MCH: 30 pg (ref 27.0–33.0)
MCHC: 34.2 g/dL (ref 32.0–36.0)
MCV: 87.8 fL (ref 80.0–100.0)
MPV: 10.3 fL (ref 7.5–12.5)
Monocytes Relative: 11.4 %
Neutro Abs: 2280 cells/uL (ref 1500–7800)
Neutrophils Relative %: 57 %
Platelets: 171 10*3/uL (ref 140–400)
RBC: 4.6 10*6/uL (ref 3.80–5.10)
RDW: 12.8 % (ref 11.0–15.0)
Total Lymphocyte: 27.7 %
WBC mixed population: 456 cells/uL (ref 200–950)
WBC: 4 10*3/uL (ref 3.8–10.8)

## 2017-09-12 LAB — BASIC METABOLIC PANEL
BUN/Creatinine Ratio: 22 (calc) (ref 6–22)
BUN: 20 mg/dL (ref 7–25)
CO2: 28 mmol/L (ref 20–32)
Calcium: 10.4 mg/dL (ref 8.6–10.4)
Chloride: 103 mmol/L (ref 98–110)
Creat: 0.92 mg/dL — ABNORMAL HIGH (ref 0.60–0.88)
Glucose, Bld: 92 mg/dL (ref 65–99)
Potassium: 4.7 mmol/L (ref 3.5–5.3)
Sodium: 138 mmol/L (ref 135–146)

## 2017-09-12 LAB — TSH: TSH: 0.77 mIU/L (ref 0.40–4.50)

## 2017-09-18 ENCOUNTER — Ambulatory Visit
Admission: RE | Admit: 2017-09-18 | Discharge: 2017-09-18 | Disposition: A | Discharge status: Home / Self Care | Payer: Medicare Other | Source: Ambulatory Visit | Attending: Internal Medicine | Admitting: Internal Medicine

## 2017-09-18 DIAGNOSIS — Z1231 Encounter for screening mammogram for malignant neoplasm of breast: Secondary | ICD-10-CM

## 2017-09-18 IMAGING — MG DIGITAL SCREENING BILATERAL MAMMOGRAM WITH TOMO AND CAD
8 series · 8 of 24 positions shown · non-contrast
Comparison: Previous exam(s).

CLINICAL DATA: Screening.

EXAM:
DIGITAL SCREENING BILATERAL MAMMOGRAM WITH TOMO AND CAD

[L MLO synth-2D]
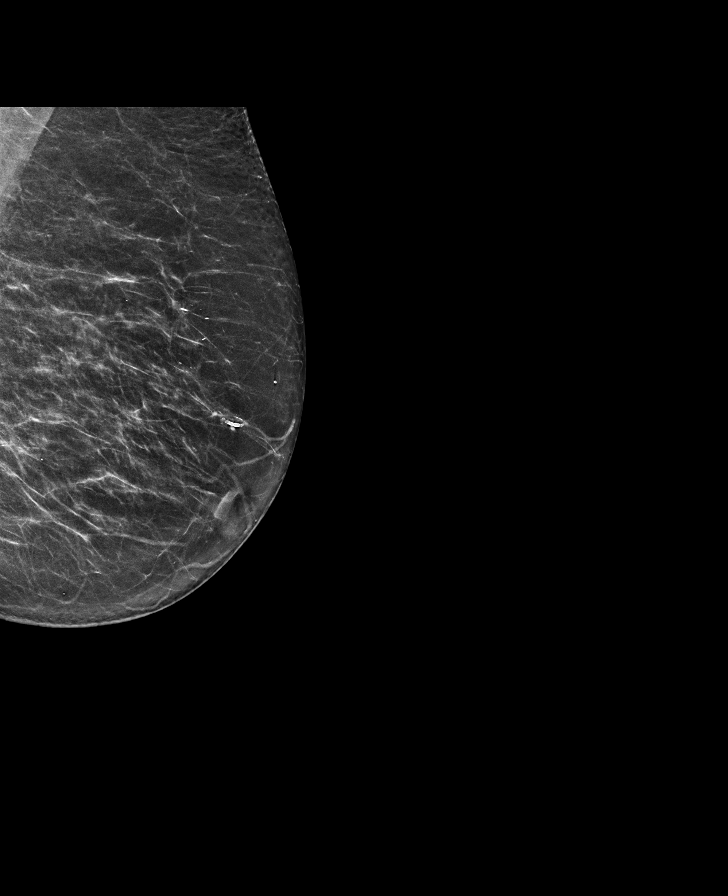

[L CC synth-2D]
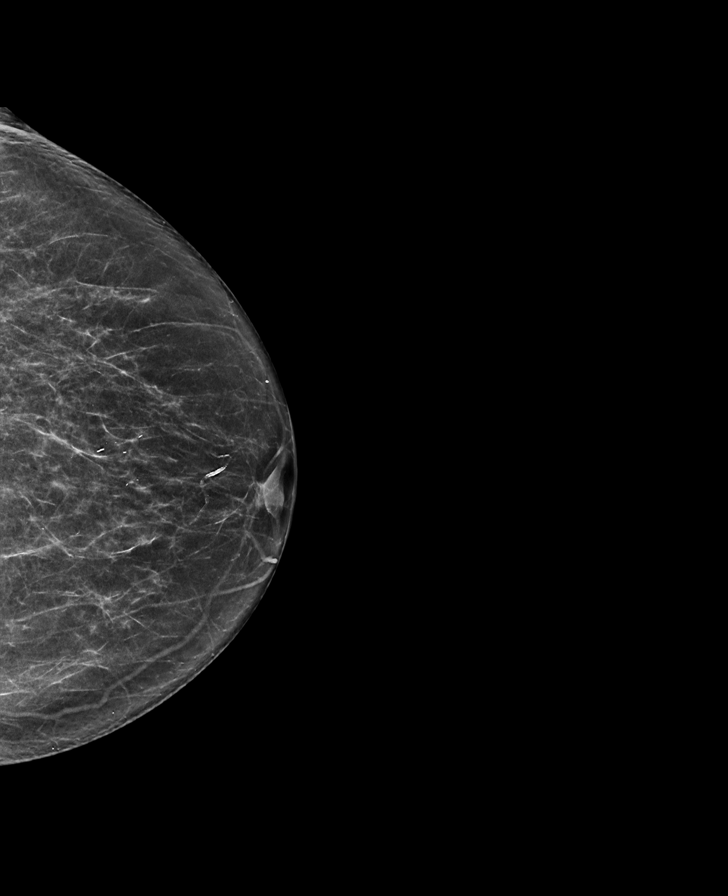

[R CC synth-2D]
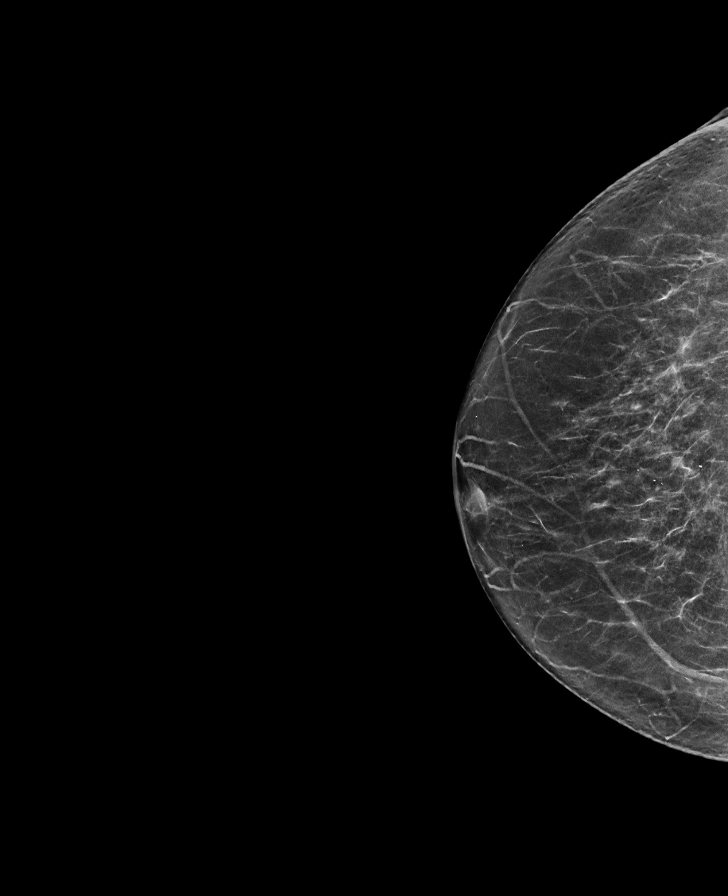

[R MLO synth-2D]
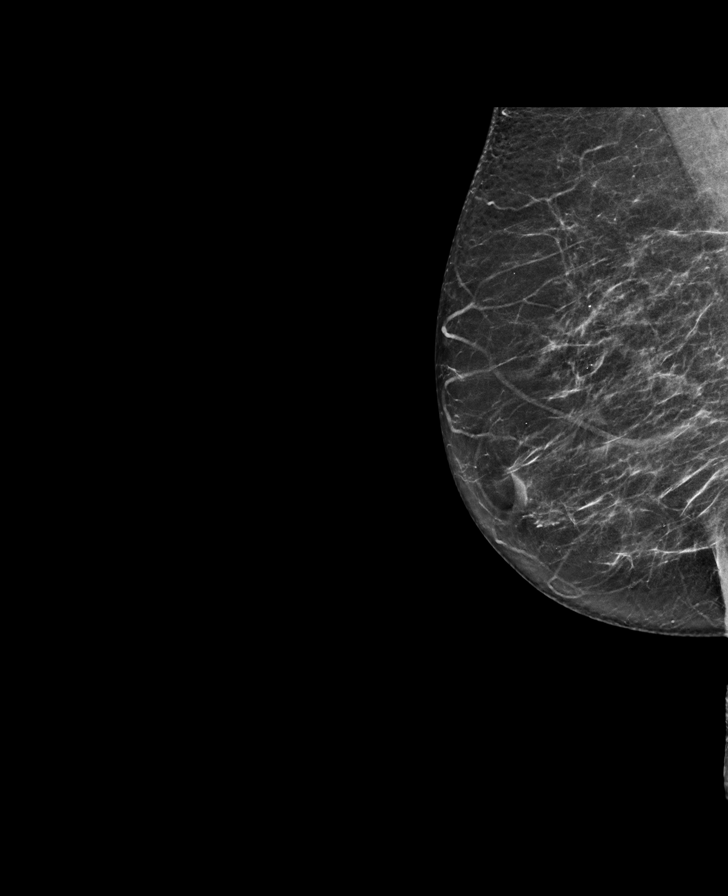

[L CC tomo · tomo slice 32/63.0]
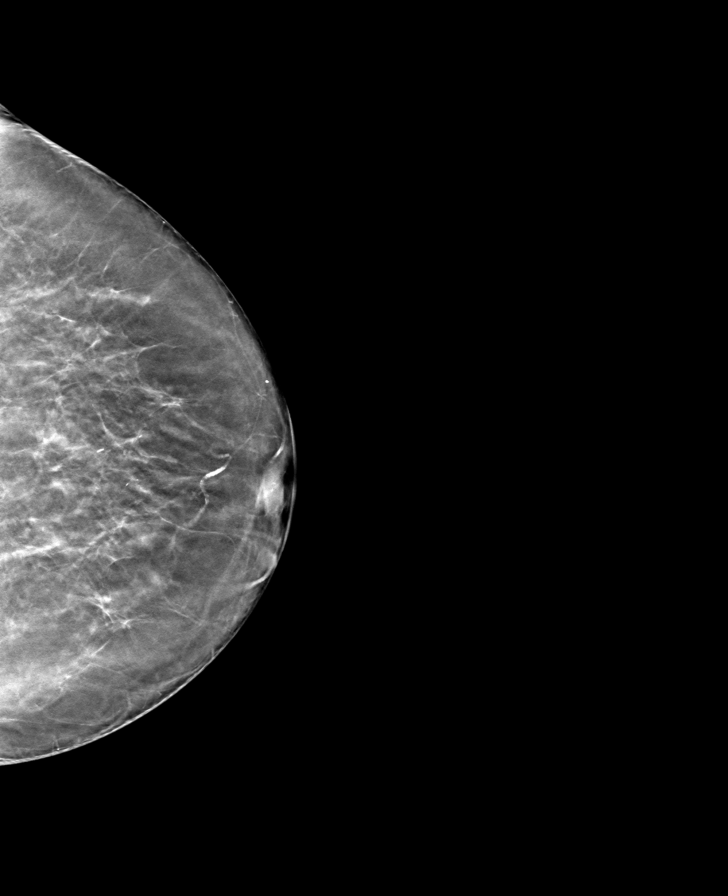

[L MLO tomo · tomo slice 34/67.0]
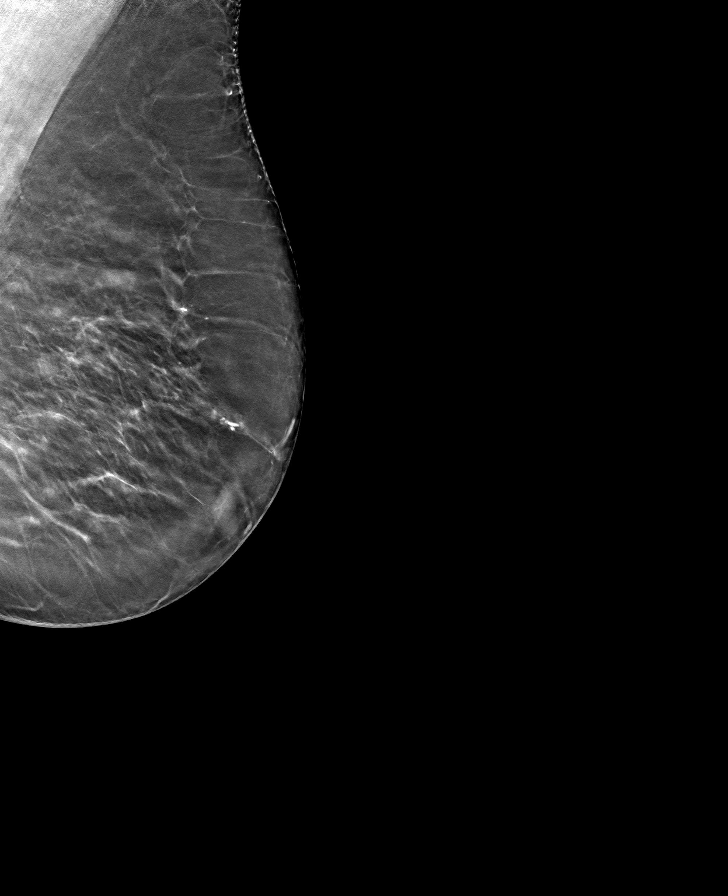

[R CC tomo · tomo slice 33/65.0]
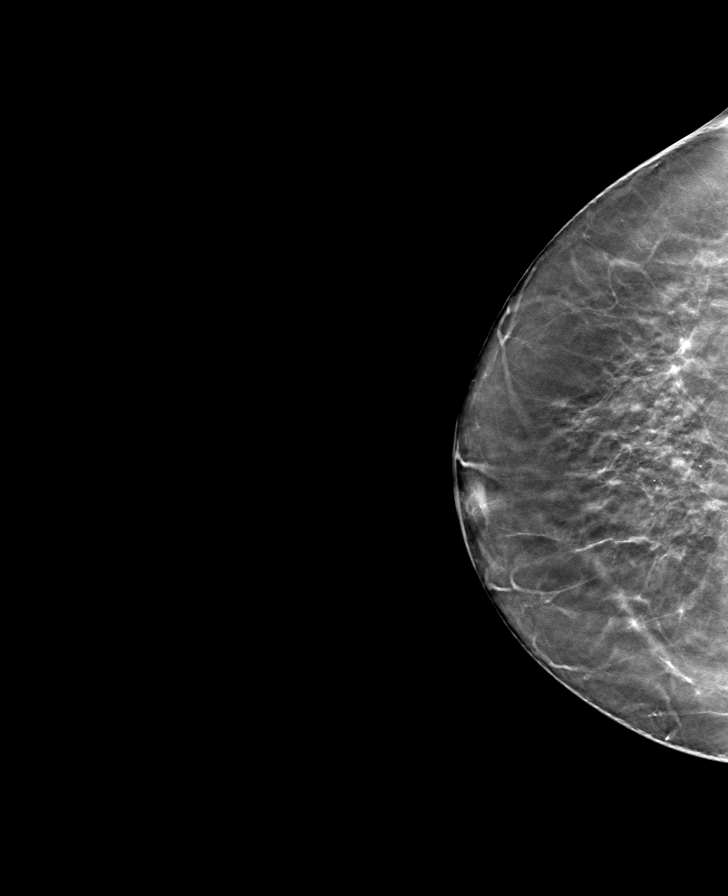

[R MLO tomo · tomo slice 35/68.0]
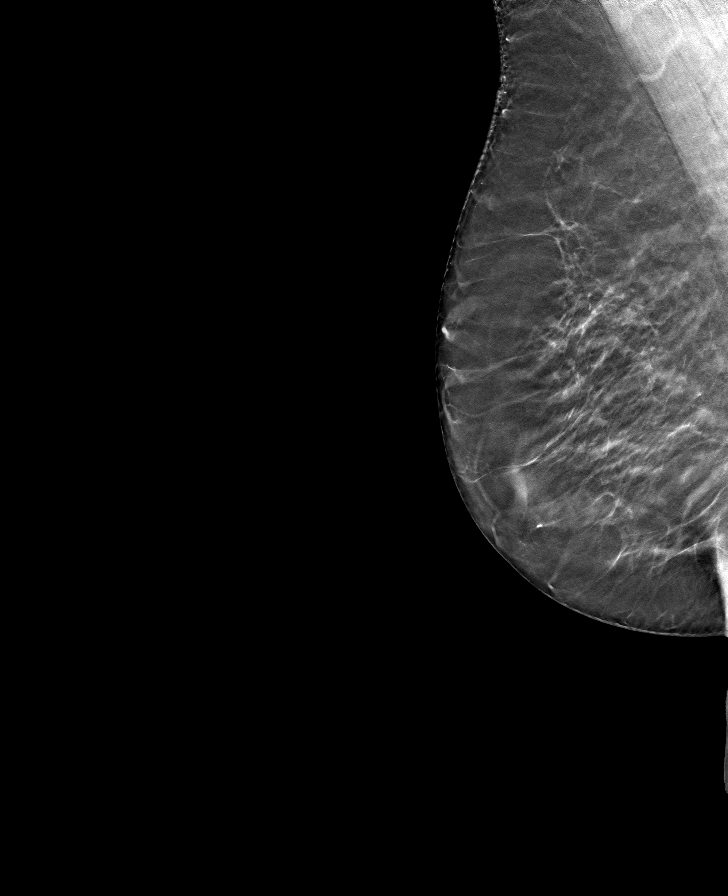

[8 of 24 positions shown; findings below may reference images not displayed]

ACR Breast Density Category b: There are scattered areas of
fibroglandular density.
FINDINGS: There are no findings suspicious for malignancy. Images were
processed with CAD.
IMPRESSION: No mammographic evidence of malignancy. A result letter of this
screening mammogram will be mailed directly to the patient.

RECOMMENDATION:
Screening mammogram in one year. (Code:[TQ])

BI-RADS CATEGORY  1: Negative.

## 2017-09-19 ENCOUNTER — Encounter: Payer: Medicare Other | Admitting: Internal Medicine

## 2017-10-03 ENCOUNTER — Encounter: Payer: Self-pay | Admitting: Internal Medicine

## 2017-10-03 ENCOUNTER — Ambulatory Visit: Payer: Medicare Other | Admitting: Internal Medicine

## 2017-10-03 VITALS — BP 122/60 | HR 65 | Temp 97.6°F | Ht 63.0 in | Wt 157.0 lb

## 2017-10-03 DIAGNOSIS — I10 Essential (primary) hypertension: Secondary | ICD-10-CM

## 2017-10-03 DIAGNOSIS — Z6827 Body mass index (BMI) 27.0-27.9, adult: Secondary | ICD-10-CM

## 2017-10-03 DIAGNOSIS — R2689 Other abnormalities of gait and mobility: Secondary | ICD-10-CM

## 2017-10-03 DIAGNOSIS — E663 Overweight: Secondary | ICD-10-CM | POA: Diagnosis not present

## 2017-10-03 DIAGNOSIS — Z636 Dependent relative needing care at home: Secondary | ICD-10-CM

## 2017-10-03 DIAGNOSIS — Z0000 Encounter for general adult medical examination without abnormal findings: Secondary | ICD-10-CM

## 2017-10-03 DIAGNOSIS — Z23 Encounter for immunization: Secondary | ICD-10-CM

## 2017-10-03 NOTE — Patient Instructions (Signed)
Please ask your daughter for some exercises to work on your balance to prevent falls.    Dizziness Dizziness is a common problem. It is a feeling of unsteadiness or light-headedness. You may feel like you are about to faint. Dizziness can lead to injury if you stumble or fall. Anyone can become dizzy, but dizziness is more common in older adults. This condition can be caused by a number of things, including medicines, dehydration, or illness. Follow these instructions at home: Eating and drinking  Drink enough fluid to keep your urine clear or pale yellow. This helps to keep you from becoming dehydrated. Try to drink more clear fluids, such as water.  Do not drink alcohol.  Limit your caffeine intake if told to do so by your health care provider. Check ingredients and nutrition facts to see if a food or beverage contains caffeine.  Limit your salt (sodium) intake if told to do so by your health care provider. Check ingredients and nutrition facts to see if a food or beverage contains sodium. Activity  Avoid making quick movements. ? Rise slowly from chairs and steady yourself until you feel okay. ? In the morning, first sit up on the side of the bed. When you feel okay, stand slowly while you hold onto something until you know that your balance is fine.  If you need to stand in one place for a long time, move your legs often. Tighten and relax the muscles in your legs while you are standing.  Do not drive or use heavy machinery if you feel dizzy.  Avoid bending down if you feel dizzy. Place items in your home so that they are easy for you to reach without leaning over. Lifestyle  Do not use any products that contain nicotine or tobacco, such as cigarettes and e-cigarettes. If you need help quitting, ask your health care provider.  Try to reduce your stress level by using methods such as yoga or meditation. Talk with your health care provider if you need help to manage your  stress. General instructions  Watch your dizziness for any changes.  Take over-the-counter and prescription medicines only as told by your health care provider. Talk with your health care provider if you think that your dizziness is caused by a medicine that you are taking.  Tell a friend or a family member that you are feeling dizzy. If he or she notices any changes in your behavior, have this person call your health care provider.  Keep all follow-up visits as told by your health care provider. This is important. Contact a health care provider if:  Your dizziness does not go away.  Your dizziness or light-headedness gets worse.  You feel nauseous.  You have reduced hearing.  You have new symptoms.  You are unsteady on your feet or you feel like the room is spinning. Get help right away if:  You vomit or have diarrhea and are unable to eat or drink anything.  You have problems talking, walking, swallowing, or using your arms, hands, or legs.  You feel generally weak.  You are not thinking clearly or you have trouble forming sentences. It may take a friend or family member to notice this.  You have chest pain, abdominal pain, shortness of breath, or sweating.  Your vision changes.  You have any bleeding.  You have a severe headache.  You have neck pain or a stiff neck.  You have a fever. These symptoms may represent a serious problem  that is an emergency. Do not wait to see if the symptoms will go away. Get medical help right away. Call your local emergency services (911 in the [place] Do not drive yourself to the hospital. Summary  Dizziness is a feeling of unsteadiness or light-headedness. This condition can be caused by a number of things, including medicines, dehydration, or illness.  Anyone can become dizzy, but dizziness is more common in older adults.  Drink enough fluid to keep your urine clear or pale yellow. Do not drink alcohol.  Avoid making quick  movements if you feel dizzy. Monitor your dizziness for any changes. This information is not intended to replace advice given to you by your health care provider. Make sure you discuss any questions you have with your health care provider. Document Released: 06/27/2000 Document Revised: 02/04/2016 Document Reviewed: 02/04/2016 Elsevier Interactive Patient Education  [employer]2018 Else[employer] Inc[employer]

## 2017-10-03 NOTE — Progress Notes (Signed)
Provider:  Gwenith Spitz. Renato Gails, D.O., C.M.D. Location:   [facility]   Place of Service:   clinic  Previous PCP: Kermit Balo, DO Patient Care Team: Kermit Balo, DO as PCP - General (Geriatric Medicine) Everardo Pacific, OD as Consulting Physician (Optometry) Awilda Bill., MD as Referring Physician (Orthopedic Surgery)  Extended Emergency Contact Information Primary Emergency Contact: Jeri Lager of [place] Home Phone: [phone] Relation: Daughter Secondary Emergency Contact: Speaks,Amy Mobile Phone: [phone] Relation: Daughter  Goals of Care: Advanced Directive information Advanced Directives 10/03/2017  Does Patient Have a Medical Advance Directive? Yes  Type of Advance Directive Healthcare Power of Attorney  Does patient want to make changes to medical advance directive? No - Patient declined  Copy of Healthcare Power of Attorney in Chart? Yes   Chief Complaint  Patient presents with  . Annual Exam    CPE    HPI: Patient is a 84 y.o. female seen today for an annual physical exam.  Reports some balance problems that have begun this summer. No falls.  She starts to lose her balance and has to catch herself.  Happens if she turns or she leans over.  Chronically has wanted to fall sideways when in a dark room.  Not dizzy.  No spinning or visual changes.  Not always the same side she stumbles to.  No lightheadedness getting up out of a chair.  Reports moving slowly.  No numbness or tingling in her feet.  Lyrica is helping her restless legs.  Still has it occasionally, but not 3 nights in a row like it was.  Still gets strange dreams.  Back still hurts, too--most of the time, especially is she stands a lot or walks a good bit.  It also hurts her in bed most of the time.  Might have to get up and take ibuprofen.  Is careful taking it with her prior GI bleeding.  It does help.  Tylenol did not help her.  Takes her b12 also for deficiency.  Things are more  stressful with her husband's memory loss.  It's progressed much faster recently.  No longer knows his grandchildren who they see often.  Their one granddaughter continues to help them out.  He calls her "that girl".  One of pt's daughters also helps a whole lot. She fears he'll fall.  Blood counts, liver, kidneys, electrolytes reviewed and ok as was thyroid.    Weight stable in overweight range.    Had her flu shot already elsewhere b/c she wanted her husband to get his flu shot early.  Past Medical History:  Diagnosis Date  . Hemorrhage of gastrointestinal tract, unspecified   . Hypertension   . Migraine   . OA (osteoarthritis)   . Thyroid cyst    Past Surgical History:  Procedure Laterality Date  . CATARACT EXTRACTION  1990   rt  . CATARACT EXTRACTION  2005   left  . REVISION TOTAL KNEE ARTHROPLASTY  2008  . THUMB ARTHROSCOPY    . TONSILLECTOMY      reports that she quit smoking about 69 years ago. Her smoking use included cigarettes. She quit after 2.00 years of use. She has never used smokeless tobacco. She reports that she drank alcohol. She reports that she does not use drugs.  Functional Status Survey:    Family History  Problem Relation Age of Onset  . CVA Father 28  . Pancreatic cancer Mother   . CVA Sister 65  . Brain cancer  Brother   . Arthritis Sister   . Breast cancer Neg Hx     Health Maintenance  Topic Date Due  . INFLUENZA VACCINE  08/15/2017  . DEXA SCAN  01/15/2018 (Originally 01/04/1995)  . TETANUS/TDAP  08/28/2023  . PNA vac Low Risk Adult  Completed    No Known Allergies  Outpatient Encounter Medications as of 10/03/2017  Medication Sig  . Calcium Carbonate-Vitamin D 600-400 MG-UNIT per tablet Take 2 tablets by mouth daily.  Marland Kitchen. losartan (COZAAR) 50 MG tablet Take 1 tablet (50 mg total) by mouth daily.  . Multiple Vitamins-Minerals (PRESERVISION AREDS 2) CAPS Take by mouth. 1 by mouth twice daily  . pregabalin (LYRICA) 50 MG capsule Take 1  capsule (50 mg total) by mouth at bedtime.  Marland Kitchen. SYNTHROID 100 MCG tablet TAKE 1 TABLET BY MOUTH ONCE DAILY  . vitamin B-12 (CYANOCOBALAMIN) 1000 MCG tablet Take 1 tablet (1,000 mcg total) by mouth daily.   No facility-administered encounter medications on file as of 10/03/2017.     Review of Systems  Constitutional: Negative for chills, fever and weight loss.  HENT: Negative for congestion.   Eyes: Negative for blurred vision.  Respiratory: Negative for cough and shortness of breath.   Cardiovascular: Negative for chest pain, palpitations and leg swelling.  Genitourinary: Negative for dysuria.  Musculoskeletal: Positive for back pain and joint pain. Negative for falls.  Skin: Negative for itching and rash.  Neurological: Negative for tingling, sensory change, focal weakness, loss of consciousness and weakness.       Balance problems recently; not dizzy, not spinning, not lightheaded, off balance  Psychiatric/Behavioral: Negative for depression and memory loss. The patient is not nervous/anxious and does not have insomnia.        Caregiver stress    Vitals:   10/03/17 0915  BP: 122/60  Pulse: 65  Temp: 97.6 F (36.4 C)  TempSrc: Oral  SpO2: 98%  Weight: 157 lb (71.2 kg)  Height: 5\' 3"  (1.6 m)   Body mass index is 27.81 kg/m. Physical Exam  Constitutional: She is oriented to person, place, and time. She appears well-developed and well-nourished. No distress.  HENT:  Head: Normocephalic and atraumatic.  Right Ear: External ear normal.  Left Ear: External ear normal.  No significant cerumen, TMs pink with normal light reflexes  Eyes: Pupils are equal, round, and reactive to light. EOM are normal.  Cardiovascular: Normal rate, regular rhythm, normal heart sounds and intact distal pulses.  Pulmonary/Chest: Effort normal and breath sounds normal. No respiratory distress.  Abdominal: Bowel sounds are normal.  Musculoskeletal: Normal range of motion. She exhibits no edema,  tenderness or deformity.  Ambulates without assistive device  Neurological: She is alert and oriented to person, place, and time. She displays normal reflexes. No cranial nerve deficit. She exhibits normal muscle tone. Coordination normal.  Skin: Skin is warm and dry. Capillary refill takes less than 2 seconds. There is pallor.  Psychiatric: She has a normal mood and affect.    Labs reviewed: Basic Metabolic Panel: Recent Labs    03/05/17 0926 09/11/17 0910  NA 136 138  K 4.7 4.7  CL 103 103  CO2 28 28  GLUCOSE 88 92  BUN 19 20  CREATININE 0.90* 0.92*  CALCIUM 10.0 10.4   Liver Function Tests: Recent Labs    03/05/17 0926  AST 15  ALT 15  BILITOT 0.5  PROT 5.9*   No results for input(s): LIPASE, AMYLASE in the last 8760 hours. No results  for input(s): AMMONIA in the last 8760 hours. CBC: Recent Labs    03/05/17 0926 09/11/17 0910  WBC 4.1 4.0  NEUTROABS 2,390 2,280  HGB 13.6 13.8  HCT 39.3 40.4  MCV 87.5 87.8  PLT 172 171   Cardiac Enzymes: No results for input(s): CKTOTAL, CKMB, CKMBINDEX, TROPONINI in the last 8760 hours. BNP: Invalid input(s): POCBNP Lab Results  Component Value Date   HGBA1C 5.1 08/22/2016   Lab Results  Component Value Date   TSH 0.77 09/11/2017   Lab Results  Component Value Date   VITAMINB12 364 09/03/2016   Assessment/Plan 1. Annual physical exam -performed today, she  Is up to date on age-appropriate health maintenance though no bone densities are on file--had refused so this was postponed until next year  2. Essential hypertension, benign -bp at goal with current regimen, monitor - EKG 12-Lead unchanged from prior when personally reviewed today, old machine had to be used so it will be scanned -NSR at 66 bpm, chronic ST changes in inferior leads  3. Overweight (BMI 25.0-29.9) -pt aware, does her best with her eating habits and exercise, but limited by back pain and responsibilities caring for her husband with  dementia -in my opinion, this weight is good considering her age and risk of frailty  4. BMI 27.0-27.9,adult -see#3, BMI c/w overweight--discussed my impression with her  5. Caregiver stress -stable, does have considerable family support which has been immensely helpful as in hpi  6. Balance problem -new issue, suggested PT, she was going to discuss with her grandchild who is a physical therapist and get balance exercises from them; if that's inadequate, she agrees to outpatient therapy (challenging with her caregiving scenario despite support she has) -fall prevention info provided in AVS -discussed importance of addressing up front before a fall occurs  Labs/tests ordered:   Orders Placed This Encounter  Procedures  . EKG 12-Lead   F/uin 6 mos for med mgt  Keller Mikels L. Natalija Mavis, D.O. Geriatrics [facility] [address], [place] [address] Cell Phone (Mon-Fri 8am-5pm):  [phone] On Call:  [phone] & follow prompts after 5pm & weekends Office Phone:  [phone] Office Fax:  [phone]

## 2017-10-10 ENCOUNTER — Other Ambulatory Visit: Payer: Self-pay | Admitting: Internal Medicine

## 2017-10-10 DIAGNOSIS — M792 Neuralgia and neuritis, unspecified: Secondary | ICD-10-CM

## 2017-10-10 NOTE — Telephone Encounter (Signed)
A medication refill was received from pharmacy for lyrica 50 mg. Rx was pended to provider for approval  after verifying last fill date, provider, and quantity on [place]MP [place] database

## 2017-10-14 DIAGNOSIS — E663 Overweight: Secondary | ICD-10-CM | POA: Insufficient documentation

## 2017-10-14 DIAGNOSIS — R2689 Other abnormalities of gait and mobility: Secondary | ICD-10-CM | POA: Insufficient documentation

## 2017-10-14 DIAGNOSIS — Z6827 Body mass index (BMI) 27.0-27.9, adult: Secondary | ICD-10-CM | POA: Insufficient documentation

## 2017-10-21 ENCOUNTER — Telehealth: Payer: Self-pay | Admitting: *Deleted

## 2017-10-21 NOTE — Telephone Encounter (Signed)
I've completed the DNR form for her and will place a copy to be scanned in her advance directives in our electronic record.  She may pick up the original or we can mail it to her.

## 2017-10-21 NOTE — Telephone Encounter (Signed)
Patient notified and agreed. Patient will pick up on Wednesday. Left up front in drawer with her name on it.

## 2017-10-21 NOTE — Telephone Encounter (Signed)
Patient called and stated that she forgot to ask you at the appointment but she would like a signed DNR. Please Advise.

## 2017-11-10 ENCOUNTER — Other Ambulatory Visit: Payer: Self-pay | Admitting: Internal Medicine

## 2017-11-10 DIAGNOSIS — E041 Nontoxic single thyroid nodule: Secondary | ICD-10-CM

## 2017-11-10 DIAGNOSIS — E038 Other specified hypothyroidism: Secondary | ICD-10-CM

## 2018-03-20 ENCOUNTER — Other Ambulatory Visit: Payer: Self-pay

## 2018-03-20 ENCOUNTER — Other Ambulatory Visit: Payer: Self-pay | Admitting: Internal Medicine

## 2018-03-20 ENCOUNTER — Ambulatory Visit (INDEPENDENT_AMBULATORY_CARE_PROVIDER_SITE_OTHER): Payer: Medicare Other | Admitting: Nurse Practitioner

## 2018-03-20 ENCOUNTER — Encounter: Payer: Self-pay | Admitting: Nurse Practitioner

## 2018-03-20 VITALS — BP 126/60 | HR 60 | Temp 97.5°F | Ht 63.0 in | Wt 161.2 lb

## 2018-03-20 DIAGNOSIS — E039 Hypothyroidism, unspecified: Secondary | ICD-10-CM | POA: Diagnosis not present

## 2018-03-20 DIAGNOSIS — R42 Dizziness and giddiness: Secondary | ICD-10-CM | POA: Diagnosis not present

## 2018-03-20 DIAGNOSIS — I10 Essential (primary) hypertension: Secondary | ICD-10-CM

## 2018-03-20 DIAGNOSIS — G3281 Cerebellar ataxia in diseases classified elsewhere: Secondary | ICD-10-CM | POA: Diagnosis not present

## 2018-03-20 NOTE — Progress Notes (Signed)
Careteam: Patient Care Team: Kermit Balo, DO as PCP - General (Geriatric Medicine) Everardo Pacific, OD as Consulting Physician (Optometry) Awilda Bill., MD as Referring Physician (Orthopedic Surgery)  Advanced Directive information Does Patient Have a Medical Advance Directive?: Yes, Type of Advance Directive: Healthcare Power of Attorney, Pre-existing out of facility DNR order (yellow form or pink MOST form): Yellow form placed in chart (order not valid for inpatient use), Does patient want to make changes to medical advance directive?: No - Patient declined  No Known Allergies  Chief Complaint  Patient presents with  . Acute Visit    dizzy, symptoms started this past Sunday when turning over in bed.Patient feels nausea, Patient states she has no other symptoms other than pain that comes and goes in back of left ear.     HPI: Patient is a 85 y.o. female seen in the office today due to dizziness.  Rolled over in bed 4 nights ago and got very dizzy. Lasted 2 seconds and then went away. The same thing happened 2 nights ago. Then last night she raised her head up and felt like she was swirling around and last all night, every time she moved. Continues to go on this morning and having nausea. Dizziness is worse when she moves her head.  Denies blurred vision (has macular degeneration), no headache, no sinus pain or pressure, no runny nose, no slurred speech, no weakness.  Very unsteady when she walks. No recent n/v/diarrhea.  No new medications. No ringing in ears.  Has not tried to take medication for this.  Has never happened before.   Review of Systems:  Review of Systems  Constitutional: Negative for chills, fever and malaise/fatigue.  HENT: Negative for congestion, ear discharge, ear pain, hearing loss, nosebleeds, sinus pain, sore throat and tinnitus.   Respiratory: Negative for sputum production.   Cardiovascular: Negative for chest pain.  Neurological: Positive for  dizziness. Negative for tingling, sensory change, speech change and weakness.    Past Medical History:  Diagnosis Date  . Hemorrhage of gastrointestinal tract, unspecified   . Hypertension   . Migraine   . OA (osteoarthritis)   . Thyroid cyst    Past Surgical History:  Procedure Laterality Date  . CATARACT EXTRACTION  1990   rt  . CATARACT EXTRACTION  2005   left  . REVISION TOTAL KNEE ARTHROPLASTY  2008  . THUMB ARTHROSCOPY    . TONSILLECTOMY     Social History:   reports that she quit smoking about 70 years ago. Her smoking use included cigarettes. She quit after 2.00 years of use. She has never used smokeless tobacco. She reports previous alcohol use. She reports that she does not use drugs.  Family History  Problem Relation Age of Onset  . CVA Father 58  . Pancreatic cancer Mother   . CVA Sister 58  . Brain cancer Brother   . Arthritis Sister   . Breast cancer Neg Hx     Medications: Patient's Medications  New Prescriptions   No medications on file  Previous Medications   CALCIUM CARBONATE-VITAMIN D 600-400 MG-UNIT PER TABLET    Take 2 tablets by mouth daily.   LOSARTAN (COZAAR) 50 MG TABLET    TAKE 1 TABLET BY MOUTH ONCE DAILY   LYRICA 50 MG CAPSULE    TAKE ONE CAPSULE BY MOUTH AT BEDTIME   MULTIPLE VITAMINS-MINERALS (PRESERVISION AREDS 2) CAPS    Take by mouth. 1 by mouth twice daily  SYNTHROID 100 MCG TABLET    TAKE 1 TABLET BY MOUTH ONCE DAILY   VITAMIN B-12 (CYANOCOBALAMIN) 1000 MCG TABLET    Take 1 tablet (1,000 mcg total) by mouth daily.  Modified Medications   No medications on file  Discontinued Medications   No medications on file     Physical Exam:  Vitals:   03/20/18 1118  BP: 126/60  Pulse: 60  Temp: (!) 97.5 F (36.4 C)  TempSrc: Oral  SpO2: 97%  Weight: 161 lb 3.2 oz (73.1 kg)  Height:  (1.6 m)   Body mass index is 28.56 kg/m.  Physical Exam Constitutional:      General: She is not in acute distress.    Appearance: She is  well-developed.  HENT:     Head: Normocephalic and atraumatic.     Right Ear: External ear normal.     Left Ear: External ear normal.     Nose: Congestion present.     Mouth/Throat:     Mouth: Mucous membranes are moist.  Eyes:     Extraocular Movements:     Right eye: No nystagmus.     Left eye: No nystagmus.     Conjunctiva/sclera: Conjunctivae normal.     Pupils: Pupils are equal, round, and reactive to light.     Comments: Has droopy right eye lid, she reports this is chronic   Cardiovascular:     Rate and Rhythm: Normal rate and regular rhythm.     Heart sounds: Normal heart sounds.  Pulmonary:     Effort: Pulmonary effort is normal. No respiratory distress.     Breath sounds: Normal breath sounds.  Abdominal:     General: Bowel sounds are normal.  Musculoskeletal: Normal range of motion.        General: No tenderness or deformity.     Comments: Ambulates without assistive device  Skin:    General: Skin is warm and dry.     Capillary Refill: Capillary refill takes less than 2 seconds.     Coloration: Skin is pale.  Neurological:     Mental Status: She is alert and oriented to person, place, and time.     Cranial Nerves: No cranial nerve deficit.     Sensory: No sensory deficit.     Motor: No weakness or abnormal muscle tone.     Coordination: Coordination normal.     Gait: Gait abnormal.     Deep Tendon Reflexes: Reflexes normal.     Labs reviewed: Basic Metabolic Panel: Recent Labs    09/11/17 0910  NA 138  K 4.7  CL 103  CO2 28  GLUCOSE 92  BUN 20  CREATININE 0.92*  CALCIUM 10.4  TSH 0.77   Liver Function Tests: No results for input(s): AST, ALT, ALKPHOS, BILITOT, PROT, ALBUMIN in the last 8760 hours. No results for input(s): LIPASE, AMYLASE in the last 8760 hours. No results for input(s): AMMONIA in the last 8760 hours. CBC: Recent Labs    09/11/17 0910  WBC 4.0  NEUTROABS 2,280  HGB 13.8  HCT 40.4  MCV 87.8  PLT 171   Lipid Panel: No  results for input(s): CHOL, HDL, LDLCALC, TRIG, CHOLHDL, LDLDIRECT in the last 8760 hours. TSH: Recent Labs    09/11/17 0910  TSH 0.77   A1C: Lab Results  Component Value Date   HGBA1C 5.1 08/22/2016     Assessment/Plan 1. Dizziness -short episodes lasting a few seconds prior to today however last nights episode has  continued.  -discussed with Dr Renato Gails who was in office and BPPV suspected but due to gait abnormality will send for CT head.  -meclizine 12.5-25 mg by mouth every 8 hours  -she currently is not driving and daughter staying with her and husband.  -encouraged proper hydration.  - BASIC METABOLIC PANEL WITH GFR - CBC with Differential/Platelet - TSH - CT Head Wo Contrast; Future  2. Hypothyroidism, unspecified type -continues on synthroid - TSH  3. Cerebellar ataxia in diseases classified elsewhere (HCC) Significant gait abnormality due to dizziness. Daughter reports she also has to look down due to light sensitivity that could trigger a migraine and feels like this may be contributing. However due to dizziness and ataxia will get CT head  - CT Head Wo Contrast; Future  Next appt: 04/07/2018 Janene Harvey. Biagio Borg  [facility] [phone]

## 2018-03-20 NOTE — Patient Instructions (Signed)
To use meclizine 12.5-25 mg by mouth every 8 hours as needed for dizziness  Benign Positional Vertigo Vertigo is the feeling that you or your surroundings are moving when they are not. Benign positional vertigo is the most common form of vertigo. This is usually a harmless condition (benign). This condition is positional. This means that symptoms are triggered by certain movements and positions. This condition can be dangerous if it occurs while you are doing something that could cause harm to you or others. This includes activities such as driving or operating machinery. What are the causes? In many cases, the cause of this condition is not known. It may be caused by a disturbance in an area of the inner ear that helps your brain to sense movement and balance. This disturbance can be caused by:  Viral infection (labyrinthitis).  Head injury.  Repetitive motion, such as jumping, dancing, or running. What increases the risk? You are more likely to develop this condition if:  You are a woman.  You are 28 years of age or older. What are the signs or symptoms? Symptoms of this condition usually happen when you move your head or your eyes in different directions. Symptoms may start suddenly, and usually last for less than a minute. They include:  Loss of balance and falling.  Feeling like you are spinning or moving.  Feeling like your surroundings are spinning or moving.  Nausea and vomiting.  Blurred vision.  Dizziness.  Involuntary eye movement (nystagmus). Symptoms can be mild and cause only minor problems, or they can be severe and interfere with daily life. Episodes of benign positional vertigo may return (recur) over time. Symptoms may improve over time. How is this diagnosed? This condition may be diagnosed based on:  Your medical history.  Physical exam of the head, neck, and ears.  Tests, such as: ? MRI. ? CT scan. ? Eye movement tests. Your health care provider  may ask you to change positions quickly while he or she watches you for symptoms of benign positional vertigo, such as nystagmus. Eye movement may be tested with a variety of exams that are designed to evaluate or stimulate vertigo. ? An electroencephalogram (EEG). This records electrical activity in your brain. ? Hearing tests. You may be referred to a health care provider who specializes in ear, nose, and throat (ENT) problems (otolaryngologist) or a provider who specializes in disorders of the nervous system (neurologist). How is this treated?  This condition may be treated in a session in which your health care provider moves your head in specific positions to adjust your inner ear back to normal. Treatment for this condition may take several sessions. Surgery may be needed in severe cases, but this is rare. In some cases, benign positional vertigo may resolve on its own in 2-4 weeks. Follow these instructions at home: Safety  Move slowly. Avoid sudden body or head movements or certain positions, as told by your health care provider.  Avoid driving until your health care provider says it is safe for you to do so.  Avoid operating heavy machinery until your health care provider says it is safe for you to do so.  Avoid doing any tasks that would be dangerous to you or others if vertigo occurs.  If you have trouble walking or keeping your balance, try using a cane for stability. If you feel dizzy or unstable, sit down right away.  Return to your normal activities as told by your health care provider. Ask  your health care provider what activities are safe for you. General instructions  Take over-the-counter and prescription medicines only as told by your health care provider.  Drink enough fluid to keep your urine pale yellow.  Keep all follow-up visits as told by your health care provider. This is important. Contact a health care provider if:  You have a fever.  Your condition gets  worse or you develop new symptoms.  Your family or friends notice any behavioral changes.  You have nausea or vomiting that gets worse.  You have numbness or a "pins and needles" sensation. Get help right away if you:  Have difficulty speaking or moving.  Are always dizzy.  Faint.  Develop severe headaches.  Have weakness in your legs or arms.  Have changes in your hearing or vision.  Develop a stiff neck.  Develop sensitivity to light. Summary  Vertigo is the feeling that you or your surroundings are moving when they are not. Benign positional vertigo is the most common form of vertigo.  The cause of this condition is not known. It may be caused by a disturbance in an area of the inner ear that helps your brain to sense movement and balance.  Symptoms include loss of balance and falling, feeling that you or your surroundings are moving, nausea and vomiting, and blurred vision.  This condition can be diagnosed based on symptoms, physical exam, and other tests, such as MRI, CT scan, eye movement tests, and hearing tests.  Follow safety instructions as told by your health care provider. You will also be told when to contact your health care provider in case of problems. This information is not intended to replace advice given to you by your health care provider. Make sure you discuss any questions you have with your health care provider. Document Released: 10/09/2005 Document Revised: 06/12/2017 Document Reviewed: 06/12/2017 Elsevier Interactive Patient Education  2019 [employer].

## 2018-03-21 LAB — CBC WITH DIFFERENTIAL/PLATELET
Absolute Monocytes: 358 cells/uL (ref 200–950)
Basophils Absolute: 28 cells/uL (ref 0–200)
Basophils Relative: 0.5 %
Eosinophils Absolute: 62 cells/uL (ref 15–500)
Eosinophils Relative: 1.1 %
HCT: 40.7 % (ref 35.0–45.0)
Hemoglobin: 14.1 g/dL (ref 11.7–15.5)
Lymphs Abs: 958 cells/uL (ref 850–3900)
MCH: 30.6 pg (ref 27.0–33.0)
MCHC: 34.6 g/dL (ref 32.0–36.0)
MCV: 88.3 fL (ref 80.0–100.0)
MPV: 10.3 fL (ref 7.5–12.5)
Monocytes Relative: 6.4 %
Neutro Abs: 4194 cells/uL (ref 1500–7800)
Neutrophils Relative %: 74.9 %
Platelets: 169 10*3/uL (ref 140–400)
RBC: 4.61 10*6/uL (ref 3.80–5.10)
RDW: 12.7 % (ref 11.0–15.0)
Total Lymphocyte: 17.1 %
WBC: 5.6 10*3/uL (ref 3.8–10.8)

## 2018-03-21 LAB — BASIC METABOLIC PANEL WITH GFR
BUN: 18 mg/dL (ref 7–25)
CO2: 27 mmol/L (ref 20–32)
Calcium: 9.8 mg/dL (ref 8.6–10.4)
Chloride: 104 mmol/L (ref 98–110)
Creat: 0.87 mg/dL (ref 0.60–0.88)
GFR, Est African American: 69 mL/min/{1.73_m2} (ref >=60)
GFR, Est Non African American: 59 mL/min/{1.73_m2} — ABNORMAL LOW (ref >=60)
Glucose, Bld: 102 mg/dL — ABNORMAL HIGH (ref 65–99)
Potassium: 4.9 mmol/L (ref 3.5–5.3)
Sodium: 138 mmol/L (ref 135–146)

## 2018-03-21 LAB — TSH: TSH: 0.62 mIU/L (ref 0.40–4.50)

## 2018-04-02 ENCOUNTER — Other Ambulatory Visit: Payer: Self-pay | Admitting: Internal Medicine

## 2018-04-02 DIAGNOSIS — M792 Neuralgia and neuritis, unspecified: Secondary | ICD-10-CM

## 2018-04-03 NOTE — Telephone Encounter (Signed)
Last filled 01/11/2018  [place] Database verified and compliance confirmed   Routed to [employer] L, DO  S.Chrae B/CMA

## 2018-04-07 ENCOUNTER — Ambulatory Visit: Payer: Medicare Other | Admitting: Internal Medicine

## 2018-05-11 ENCOUNTER — Encounter: Payer: Self-pay | Admitting: Internal Medicine

## 2018-05-12 ENCOUNTER — Other Ambulatory Visit: Payer: Self-pay | Admitting: Internal Medicine

## 2018-05-12 DIAGNOSIS — E041 Nontoxic single thyroid nodule: Secondary | ICD-10-CM

## 2018-05-12 DIAGNOSIS — E038 Other specified hypothyroidism: Secondary | ICD-10-CM

## 2018-06-02 ENCOUNTER — Encounter: Payer: Self-pay | Admitting: Internal Medicine

## 2018-06-02 ENCOUNTER — Ambulatory Visit: Payer: Self-pay | Admitting: Internal Medicine

## 2018-06-02 ENCOUNTER — Ambulatory Visit (INDEPENDENT_AMBULATORY_CARE_PROVIDER_SITE_OTHER): Payer: Medicare Other | Admitting: Internal Medicine

## 2018-06-02 ENCOUNTER — Other Ambulatory Visit: Payer: Self-pay

## 2018-06-02 DIAGNOSIS — Z6827 Body mass index (BMI) 27.0-27.9, adult: Secondary | ICD-10-CM

## 2018-06-02 DIAGNOSIS — E039 Hypothyroidism, unspecified: Secondary | ICD-10-CM | POA: Diagnosis not present

## 2018-06-02 DIAGNOSIS — I10 Essential (primary) hypertension: Secondary | ICD-10-CM

## 2018-06-02 DIAGNOSIS — E663 Overweight: Secondary | ICD-10-CM | POA: Diagnosis not present

## 2018-06-02 DIAGNOSIS — M159 Polyosteoarthritis, unspecified: Secondary | ICD-10-CM

## 2018-06-02 DIAGNOSIS — H353130 Nonexudative age-related macular degeneration, bilateral, stage unspecified: Secondary | ICD-10-CM

## 2018-06-02 NOTE — Progress Notes (Signed)
Patient ID: Donna [name], female   DOB: November 20, 1927, 85 y.o.   MRN: [id number] This service is provided via telemedicine  No vital signs collected/recorded due to the encounter was a telemedicine visit.   Location of patient (ex: home, work):  home  Patient consents to a telephone visit:  yes  Location of the provider (ex: office, home):  office  Name of any referring provider:  Bufford Spikes, DO  Names of all persons participating in the telemedicine service and their role in the encounter:  Patient, DeShannon Katrinka Blazing CMA, Bufford Spikes DO  Time spent on call:  2:58    Provider:  Ajahnae Rathgeber L. Renato Gails, D.O., C.M.D.  Code Status: DNR Goals of Care:  Advanced Directives 06/02/2018  Does Patient Have a Medical Advance Directive? Yes  Type of Estate agent of [place];Out of facility DNR (pink MOST or yellow form)  Does patient want to make changes to medical advance directive? No - Patient declined  Copy of Healthcare Power of Attorney in Chart? Yes - validated most recent copy scanned in chart (See row information)  Pre-existing out of facility DNR order (yellow form or pink MOST form) Yellow form placed in chart (order not valid for inpatient use)     Chief Complaint  Patient presents with  . Medical Management of Chronic Issues    follow-up    HPI: Patient is a 85 y.o. female seen today for medical management of chronic diseases.    She had a fit of vertigo in March and did get over it after the weekend.    She, of course, is not going anywhere.  Her daughter is doing their shopping.    HTN;  Was not having difficulty with bps and no longer checks at home.    Hypothyroidism:  Continues on levothyroxine w/o difficulty.  Generalized OA:  Aches and pains are still there.  Knees and wrists and back all hurt pretty constantly.  Takes ibuprofen when she has to.  She is walking around the house and out in the yard and chasing the granddaughter's puppy.     Restless legs:  lyrica quit helping so she has stopped it.  She's still bothered, but no worse w/o the medication.  She is not anemic.    Macular degeneration:  Eyes are doing ok.  Sometimes she gets frustrated.  She can see well enough.  Retina specialist had her see another specialist who gave her some tips.  She is still able to see to drive and read.  She uses a bright light and her reading glasses.    She has not had a bone density test in a long time.  She did have two of them which were normal.  Calcium with D is taken two daily and does her walking.  Does not really want to recheck her bone density.    Past Medical History:  Diagnosis Date  . Hemorrhage of gastrointestinal tract, unspecified   . Hypertension   . Migraine   . OA (osteoarthritis)   . Thyroid cyst     Past Surgical History:  Procedure Laterality Date  . CATARACT EXTRACTION  1990   rt  . CATARACT EXTRACTION  2005   left  . REVISION TOTAL KNEE ARTHROPLASTY  2008  . THUMB ARTHROSCOPY    . TONSILLECTOMY      No Known Allergies  Outpatient Encounter Medications as of 06/02/2018  Medication Sig  . Calcium Carbonate-Vitamin D 600-400 MG-UNIT per tablet Take 2  tablets by mouth daily.  Marland Kitchen. losartan (COZAAR) 50 MG tablet TAKE 1 TABLET BY MOUTH ONCE DAILY  . Multiple Vitamins-Minerals (PRESERVISION AREDS 2) CAPS Take 1 capsule by mouth 2 (two) times a day.   Marland Kitchen. SYNTHROID 100 MCG tablet TAKE 1 TABLET BY MOUTH EVERY DAY  . vitamin B-12 (CYANOCOBALAMIN) 1000 MCG tablet Take 1 tablet (1,000 mcg total) by mouth daily.  . [DISCONTINUED] pregabalin (LYRICA) 50 MG capsule TAKE 1 CAPSULE BY MOUTH AT BEDTIME   No facility-administered encounter medications on file as of 06/02/2018.     Review of Systems:  Review of Systems  Constitutional: Negative for chills, fever and malaise/fatigue.  HENT: Negative for tinnitus.   Eyes: Negative for blurred vision.       Has macular degeneration but still seeing well to drive and read   Respiratory: Negative for cough and shortness of breath.   Cardiovascular: Negative for chest pain, palpitations and leg swelling.  Gastrointestinal: Negative for abdominal pain, blood in stool, constipation, diarrhea, heartburn, melena, nausea and vomiting.  Genitourinary: Negative for dysuria.  Musculoskeletal: Positive for back pain and joint pain. Negative for falls.  Skin: Negative for itching and rash.  Neurological: Negative for dizziness and loss of consciousness.       Restless legs  Endo/Heme/Allergies: Bruises/bleeds easily.  Psychiatric/Behavioral: Negative for depression and memory loss. The patient is not nervous/anxious and does not have insomnia.     Health Maintenance  Topic Date Due  . DEXA SCAN  01/04/1995  . INFLUENZA VACCINE  08/16/2018  . TETANUS/TDAP  08/28/2023  . PNA vac Low Risk Adult  Completed    Physical Exam: Could not be performed as visit non face-to-face via phone   Labs reviewed: Basic Metabolic Panel: Recent Labs    09/11/17 0910 03/20/18 1153  NA 138 138  K 4.7 4.9  CL 103 104  CO2 28 27  GLUCOSE 92 102*  BUN 20 18  CREATININE 0.92* 0.87  CALCIUM 10.4 9.8  TSH 0.77 0.62   Liver Function Tests: No results for input(s): AST, ALT, ALKPHOS, BILITOT, PROT, ALBUMIN in the last 8760 hours. No results for input(s): LIPASE, AMYLASE in the last 8760 hours. No results for input(s): AMMONIA in the last 8760 hours. CBC: Recent Labs    09/11/17 0910 03/20/18 1153  WBC 4.0 5.6  NEUTROABS 2,280 4,194  HGB 13.8 14.1  HCT 40.4 40.7  MCV 87.8 88.3  PLT 171 169   Lipid Panel: No results for input(s): CHOL, HDL, LDLCALC, TRIG, CHOLHDL, LDLDIRECT in the last 8760 hours. Lab Results  Component Value Date   HGBA1C 5.1 08/22/2016    Procedures since last visit: No results found.  Assessment/Plan There are no diagnoses linked to this encounter.   Labs/tests ordered:   Orders Placed This Encounter  Procedures  . CBC with  Differential/Platelet    Standing Status:   Future    Standing Expiration Date:   06/02/2019  . COMPLETE METABOLIC PANEL WITH GFR    Standing Status:   Future    Standing Expiration Date:   06/02/2019  . Lipid panel    Standing Status:   Future    Standing Expiration Date:   06/02/2019    Order Specific Question:   Has the patient fasted?    Answer:   Yes  . TSH    Standing Status:   Future    Standing Expiration Date:   06/02/2019    Next appt:  09/16/2018 for AWV with NP; CPE  with me in Nov with fasting labs before  Non face-to-face time spent on televisit:  25 minutes  Minka Knight L. Avnoor Koury, D.O. Geriatrics [facility] [address], [place] [address] Cell Phone (Mon-Fri 8am-5pm):  [phone] On Call:  [phone] & follow prompts after 5pm & weekends Office Phone:  [phone] Office Fax:  [phone]

## 2018-09-15 ENCOUNTER — Ambulatory Visit: Payer: Self-pay

## 2018-09-15 ENCOUNTER — Encounter: Payer: Self-pay | Admitting: Family

## 2018-09-16 ENCOUNTER — Other Ambulatory Visit: Payer: Self-pay

## 2018-09-16 ENCOUNTER — Ambulatory Visit (INDEPENDENT_AMBULATORY_CARE_PROVIDER_SITE_OTHER): Payer: Medicare Other | Admitting: Family

## 2018-09-16 ENCOUNTER — Encounter: Payer: Self-pay | Admitting: Family

## 2018-09-16 VITALS — BP 126/60 | HR 65 | Temp 97.3°F | Ht 63.0 in | Wt 155.6 lb

## 2018-09-16 DIAGNOSIS — Z0000 Encounter for general adult medical examination without abnormal findings: Secondary | ICD-10-CM

## 2018-09-16 NOTE — Progress Notes (Signed)
Subjective:   Donna [name] is a 85 y.o. female who presents for Medicare Annual (Subsequent) preventive examination.  Review of Systems:  Cardiac Risk Factors include: advanced age (>5855men, 79>65 women);hypertension;sedentary lifestyle     Objective:     Vitals: BP 126/60   Pulse 65   Temp (!) 97.3 F (36.3 C) (Temporal)   Ht 5\' 3"  (1.6 m)   Wt 155 lb 9.6 oz (70.6 kg)   SpO2 91%   BMI 27.56 kg/m   Body mass index is 27.56 kg/m.  Advanced Directives 09/16/2018 06/02/2018 03/20/2018 10/03/2017 09/11/2017 03/07/2017 08/22/2016  Does Patient Have a Medical Advance Directive? Yes Yes Yes Yes Yes Yes Yes  Type of Estate agentAdvance Directive Healthcare Power of [place];Living will;Out of facility DNR (pink MOST or yellow form) Healthcare Power of [place];Out of facility DNR (pink MOST or yellow form) Healthcare Power of [employer] Healthcare Power of [employer] Healthcare Power of [employer] Healthcare Power of [employer] Healthcare Power of [place];Living will  Does patient want to make changes to medical advance directive? No - Patient declined No - Patient declined No - Patient declined No - Patient declined No - Patient declined No - Patient declined No - Patient declined  Copy of Healthcare Power of Attorney in Chart? Yes - validated most recent copy scanned in chart (See row information) Yes - validated most recent copy scanned in chart (See row information) Yes - validated most recent copy scanned in chart (See row information) Yes Yes Yes Yes  Pre-existing out of facility DNR order (yellow form or pink MOST form) - Yellow form placed in chart (order not valid for inpatient use) Yellow form placed in chart (order not valid for inpatient use) - - - -    Tobacco Social History   Tobacco Use  Smoking Status Former Smoker  . Years: 2.00  . Types: Cigarettes  . Quit date: 01/16/1948  . Years since quitting: 70.7  Smokeless Tobacco Never Used  Tobacco Comment   smoked on occ when she was a teen      Counseling given: Not Answered Comment: smoked on occ when she was a teen   Clinical Intake:  Pre-visit preparation completed: No  Pain : 0-10 Pain Score: 5  Pain Type: Chronic pain Pain Location: Back Pain Orientation: Lower Pain Radiating Towards: No Pain Descriptors / Indicators: Aching Pain Onset: Other (comment)(chronic) Pain Frequency: Constant Pain Relieving Factors: ibuprofen Effect of Pain on Daily Activities: affects her ADL's  Pain Relieving Factors: ibuprofen  BMI - recorded: 27.56 Nutritional Status: BMI 25 -29 Overweight Nutritional Risks: None Diabetes: No  How often do you need to have someone help you when you read instructions, pamphlets, or other written materials from your doctor or pharmacy?: 1 - Never What is the last grade level you completed in school?: College  Interpreter Needed?: No  Information entered by :: Marissah Vandemark FNP-C  Past Medical History:  Diagnosis Date  . Hemorrhage of gastrointestinal tract, unspecified   . Hypertension   . Migraine   . OA (osteoarthritis)   . Thyroid cyst    Past Surgical History:  Procedure Laterality Date  . CATARACT EXTRACTION  1990   rt  . CATARACT EXTRACTION  2005   left  . REVISION TOTAL KNEE ARTHROPLASTY  2008  . THUMB ARTHROSCOPY    . TONSILLECTOMY     Family History  Problem Relation Age of Onset  . CVA Father 4679  . Pancreatic cancer Mother   . CVA Sister 4365  .  Brain cancer Brother   . Arthritis Sister   . Breast cancer Neg Hx    Social History   Socioeconomic History  . Marital status: Married    Spouse name: Not on file  . Number of children: Not on file  . Years of education: Not on file  . Highest education level: Not on file  Occupational History  . Not on file  Social Needs  . Financial resource strain: Not hard at all  . Food insecurity    Worry: Never true    Inability: Never true  . Transportation needs    Medical: No    Non-medical: No  Tobacco Use  .  Smoking status: Former Smoker    Years: 2.00    Types: Cigarettes    Quit date: 01/16/1948    Years since quitting: 70.7  . Smokeless tobacco: Never Used  . Tobacco comment: smoked on occ when she was a teen  Substance and Sexual Activity  . Alcohol use: Not Currently    Comment: rarely has a glass of wine  . Drug use: No  . Sexual activity: Not on file  Lifestyle  . Physical activity    Days per week: 0 days    Minutes per session: 0 min  . Stress: To some extent  Relationships  . Social connections    Talks on phone: More than three times a week    Gets together: More than three times a week    Attends religious service: More than 4 times per year    Active member of club or organization: No    Attends meetings of clubs or organizations: Never    Relationship status: Married  Other Topics Concern  . Not on file  Social History Narrative  . Not on file    Outpatient Encounter Medications as of 09/16/2018  Medication Sig  . Calcium Carbonate-Vitamin D 600-400 MG-UNIT per tablet Take 2 tablets by mouth daily.  Marland Kitchen losartan (COZAAR) 50 MG tablet TAKE 1 TABLET BY MOUTH ONCE DAILY  . Multiple Vitamins-Minerals (PRESERVISION AREDS 2) CAPS Take 1 capsule by mouth 2 (two) times a day.   Marland Kitchen OVER THE COUNTER MEDICATION MitoQ 2 capsules by mouth daily  . SYNTHROID 100 MCG tablet TAKE 1 TABLET BY MOUTH EVERY DAY  . vitamin B-12 (CYANOCOBALAMIN) 1000 MCG tablet Take 1 tablet (1,000 mcg total) by mouth daily.   No facility-administered encounter medications on file as of 09/16/2018.     Activities of Daily Living In your present state of health, do you have any difficulty performing the following activities: 09/16/2018  Hearing? N  Vision? N  Difficulty concentrating or making decisions? Y  Comment concentrating.  Walking or climbing stairs? N  Dressing or bathing? Y  Comment stiffiness on the fngers due to arthritis hard to button shirts  Doing errands, shopping? N  Preparing Food and  eating ? N  Using the Toilet? N  In the past six months, have you accidently leaked urine? N  Do you have problems with loss of bowel control? N  Managing your Medications? N  Managing your Finances? N  Housekeeping or managing your Housekeeping? Y  Comment Has asssistance every other week  Some recent data might be hidden    Patient Care Team: Kermit Balo, DO as PCP - General (Geriatric Medicine) Everardo Pacific, OD as Consulting Physician (Optometry) Awilda Bill., MD as Referring Physician (Orthopedic Surgery)    Assessment:   This is a routine wellness  examination for Donna [name].  Exercise Activities and Dietary recommendations Current Exercise Habits: The patient does not participate in regular exercise at present, Exercise limited by: Other - see comments(caring for husband who has dementia)  Goals    . Maintain self care     Patient will maintain self care       Fall Risk Fall Risk  09/16/2018 06/02/2018 03/20/2018 10/03/2017 09/11/2017  Falls in the past year? 0 0 0 No Yes  Number falls in past yr: 0 0 0 - 1  Injury with Fall? 0 0 0 - No   Is the patient's home free of loose throw rugs in walkways, pet beds, electrical cords, etc?   yes      Grab bars in the bathroom? yes      Handrails on the stairs?   no stairs       Adequate lighting?   yes  Depression Screen PHQ 2/9 Scores 09/16/2018 06/02/2018 10/03/2017 09/11/2017  PHQ - 2 Score 0 0 0 0     Cognitive Function MMSE - Mini Mental State Exam 09/16/2018 09/11/2017 08/22/2016 08/19/2015 08/13/2014  Orientation to time 5 5 5 5 5   Orientation to Place 5 5 5 5 5   Registration 3 3 3 3 3   Attention/ Calculation 5 5 5 5 5   Recall 3 3 2 3 2   Language- name 2 objects 2 2 2 2 2   Language- repeat 1 1 1 1 1   Language- follow 3 step command 3 3 3 3 3   Language- read & follow direction 1 1 1 1 1   Write a sentence 1 1 1 1 1   Copy design 1 1 1 1 1   Total score 30 30 29 30 29         Immunization History  Administered Date(s)  Administered  . Influenza Whole 10/16/2011  . Influenza, High Dose Seasonal PF 10/01/2017  . Influenza,inj,Quad PF,6+ Mos 09/21/2015  . Influenza-Unspecified 10/22/2013, 10/01/2014, 09/26/2016  . Pneumococcal Conjugate-13 08/13/2014  . Pneumococcal Polysaccharide-23 08/19/2015  . Tdap 08/27/2013  . Zoster 01/16/2011  . Zoster Recombinat (Shingrix) 05/29/2016, 08/23/2016    Qualifies for Shingles Vaccine? Up to date   Screening Tests Health Maintenance  Topic Date Due  . DEXA SCAN  01/04/1995  . INFLUENZA VACCINE  08/16/2018  . TETANUS/TDAP  08/28/2023  . PNA vac Low Risk Adult  Completed    Cancer Screenings: Lung: Low Dose CT Chest recommended if Age 22-80 years, 30 pack-year currently smoking OR have quit w/in 15years. Patient does not qualify. Breast:  Up to date on Mammogram? Yes   Up to date of Bone Density/Dexa? Yes Colorectal: Age out   Additional Screenings:  Hepatitis C Screening: Low risk      Plan:   I have personally reviewed and noted the following in the patient's chart:   . Medical and social history . Use of alcohol, tobacco or illicit drugs  . Current medications and supplements . Functional ability and status . Nutritional status . Physical activity . Advanced directives . List of other physicians . Hospitalizations, surgeries, and ER visits in previous 12 months . Vitals . Screenings to include cognitive, depression, and falls . Referrals and appointments  In addition, I have reviewed and discussed with patient certain preventive protocols, quality metrics, and best practice recommendations. A written personalized care plan for preventive services as well as general preventive health recommendations were provided to patient.    Sandrea Hughs, NP  09/16/2018

## 2018-09-16 NOTE — Patient Instructions (Signed)
Donna [name] , Thank you for taking time to come for your Medicare Wellness Visit. I appreciate your ongoing commitment to your health goals. Please review the following plan we discussed and let me know if I can assist you in the future.   Screening recommendations/referrals: Colonoscopy: Age out  Mammogram: Up to date  Bone Density : Up to date  Recommended yearly ophthalmology/optometry visit for glaucoma screening and checkup Recommended yearly dental visit for hygiene and checkup  Vaccinations: Influenza vaccine: Due this year  Pneumococcal vaccine : Up to date  Tdap vaccine : Up to date due 08/28/2023  Shingles vaccine : Up to date     Advanced directives:Yes   Conditions/risks identified: Advance age > 75 Yrs,Hypertension,Sedentary Lifestyle   Next appointment: 1 year    Preventive Care 11 Years and Older, Female Preventive care refers to lifestyle choices and visits with your health care provider that can promote health and wellness. What does preventive care include?  A yearly physical exam. This is also called an annual well check.  Dental exams once or twice a year.  Routine eye exams. Ask your health care provider how often you should have your eyes checked.  Personal lifestyle choices, including:  Daily care of your teeth and gums.  Regular physical activity.  Eating a healthy diet.  Avoiding tobacco and drug use.  Limiting alcohol use.  Practicing safe sex.  Taking low-dose aspirin every day.  Taking vitamin and mineral supplements as recommended by your health care provider. What happens during an annual well check? The services and screenings done by your health care provider during your annual well check will depend on your age, overall health, lifestyle risk factors, and family history of disease. Counseling  Your health care provider may ask you questions about your:  Alcohol use.  Tobacco use.  Drug use.  Emotional well-being.  Home and  relationship well-being.  Sexual activity.  Eating habits.  History of falls.  Memory and ability to understand (cognition).  Work and work Statistician.  Reproductive health. Screening  You may have the following tests or measurements:  Height, weight, and BMI.  Blood pressure.  Lipid and cholesterol levels. These may be checked every 5 years, or more frequently if you are over 82 years old.  Skin check.  Lung cancer screening. You may have this screening every year starting at age 86 if you have a 30-pack-year history of smoking and currently smoke or have quit within the past 15 years.  Fecal occult blood test (FOBT) of the stool. You may have this test every year starting at age 44.  Flexible sigmoidoscopy or colonoscopy. You may have a sigmoidoscopy every 5 years or a colonoscopy every 10 years starting at age 15.  Hepatitis C blood test.  Hepatitis B blood test.  Sexually transmitted disease (STD) testing.  Diabetes screening. This is done by checking your blood sugar (glucose) after you have not eaten for a while (fasting). You may have this done every 1-3 years.  Bone density scan. This is done to screen for osteoporosis. You may have this done starting at age 91.  Mammogram. This may be done every 1-2 years. Talk to your health care provider about how often you should have regular mammograms. Talk with your health care provider about your test results, treatment options, and if necessary, the need for more tests. Vaccines  Your health care provider may recommend certain vaccines, such as:  Influenza vaccine. This is recommended every year.  Tetanus, diphtheria, and acellular pertussis (Tdap, Td) vaccine. You may need a Td booster every 10 years.  Zoster vaccine. You may need this after age 54.  Pneumococcal 13-valent conjugate (PCV13) vaccine. One dose is recommended after age 36.  Pneumococcal polysaccharide (PPSV23) vaccine. One dose is recommended after  age 55. Talk to your health care provider about which screenings and vaccines you need and how often you need them. This information is not intended to replace advice given to you by your health care provider. Make sure you discuss any questions you have with your health care provider. Document Released: 01/28/2015 Document Revised: 09/21/2015 Document Reviewed: 11/02/2014 Elsevier Interactive Patient Education  2017 [place] Prevention in the Home Falls can cause injuries. They can happen to people of all ages. There are many things you can do to make your home safe and to help prevent falls. What can I do on the outside of my home?  Regularly fix the edges of walkways and driveways and fix any cracks.  Remove anything that might make you trip as you walk through a door, such as a raised step or threshold.  Trim any bushes or trees on the path to your home.  Use bright outdoor lighting.  Clear any walking paths of anything that might make someone trip, such as rocks or tools.  Regularly check to see if handrails are loose or broken. Make sure that both sides of any steps have handrails.  Any raised decks and porches should have guardrails on the edges.  Have any leaves, snow, or ice cleared regularly.  Use sand or salt on walking paths during winter.  Clean up any spills in your garage right away. This includes oil or grease spills. What can I do in the bathroom?  Use night lights.  Install grab bars by the toilet and in the tub and shower. Do not use towel bars as grab bars.  Use non-skid mats or decals in the tub or shower.  If you need to sit down in the shower, use a plastic, non-slip stool.  Keep the floor dry. Clean up any water that spills on the floor as soon as it happens.  Remove soap buildup in the tub or shower regularly.  Attach bath mats securely with double-sided non-slip rug tape.  Do not have throw rugs and other things on the floor that can  make you trip. What can I do in the bedroom?  Use night lights.  Make sure that you have a light by your bed that is easy to reach.  Do not use any sheets or blankets that are too big for your bed. They should not hang down onto the floor.  Have a firm chair that has side arms. You can use this for support while you get dressed.  Do not have throw rugs and other things on the floor that can make you trip. What can I do in the kitchen?  Clean up any spills right away.  Avoid walking on wet floors.  Keep items that you use a lot in easy-to-reach places.  If you need to reach something above you, use a strong step stool that has a grab bar.  Keep electrical cords out of the way.  Do not use floor polish or wax that makes floors slippery. If you must use wax, use non-skid floor wax.  Do not have throw rugs and other things on the floor that can make you trip. What can I do  with my stairs?  Do not leave any items on the stairs.  Make sure that there are handrails on both sides of the stairs and use them. Fix handrails that are broken or loose. Make sure that handrails are as long as the stairways.  Check any carpeting to make sure that it is firmly attached to the stairs. Fix any carpet that is loose or worn.  Avoid having throw rugs at the top or bottom of the stairs. If you do have throw rugs, attach them to the floor with carpet tape.  Make sure that you have a light switch at the top of the stairs and the bottom of the stairs. If you do not have them, ask someone to add them for you. What else can I do to help prevent falls?  Wear shoes that:  Do not have high heels.  Have rubber bottoms.  Are comfortable and fit you well.  Are closed at the toe. Do not wear sandals.  If you use a stepladder:  Make sure that it is fully opened. Do not climb a closed stepladder.  Make sure that both sides of the stepladder are locked into place.  Ask someone to hold it for you,  if possible.  Clearly mark and make sure that you can see:  Any grab bars or handrails.  First and last steps.  Where the edge of each step is.  Use tools that help you move around (mobility aids) if they are needed. These include:  Canes.  Walkers.  Scooters.  Crutches.  Turn on the lights when you go into a dark area. Replace any light bulbs as soon as they burn out.  Set up your furniture so you have a clear path. Avoid moving your furniture around.  If any of your floors are uneven, fix them.  If there are any pets around you, be aware of where they are.  Review your medicines with your doctor. Some medicines can make you feel dizzy. This can increase your chance of falling. Ask your doctor what other things that you can do to help prevent falls. This information is not intended to replace advice given to you by your health care provider. Make sure you discuss any questions you have with your health care provider. Document Released: 10/28/2008 Document Revised: 06/09/2015 Document Reviewed: 02/05/2014 Elsevier Interactive Patient Education  2017 [employer].

## 2018-09-17 ENCOUNTER — Other Ambulatory Visit: Payer: Self-pay | Admitting: *Deleted

## 2018-09-17 DIAGNOSIS — I10 Essential (primary) hypertension: Secondary | ICD-10-CM

## 2018-09-17 MED ORDER — LOSARTAN POTASSIUM 50 MG PO TABS: 50.0000 mg | ORAL_TABLET | Freq: Every day | ORAL | 1 refills | Status: DC

## 2018-09-17 NOTE — Telephone Encounter (Signed)
[facility] 

## 2018-11-11 ENCOUNTER — Other Ambulatory Visit: Payer: Self-pay | Admitting: *Deleted

## 2018-11-11 DIAGNOSIS — E038 Other specified hypothyroidism: Secondary | ICD-10-CM

## 2018-11-11 DIAGNOSIS — E041 Nontoxic single thyroid nodule: Secondary | ICD-10-CM

## 2018-11-11 MED ORDER — LEVOTHYROXINE SODIUM 100 MCG PO TABS: 100.0000 ug | ORAL_TABLET | Freq: Every day | ORAL | 1 refills | Status: DC

## 2018-11-11 NOTE — Telephone Encounter (Signed)
[facility] 

## 2018-12-08 ENCOUNTER — Other Ambulatory Visit: Payer: Self-pay

## 2018-12-08 ENCOUNTER — Other Ambulatory Visit: Payer: Self-pay | Admitting: *Deleted

## 2018-12-08 ENCOUNTER — Ambulatory Visit (INDEPENDENT_AMBULATORY_CARE_PROVIDER_SITE_OTHER): Payer: Medicare Other | Admitting: Internal Medicine

## 2018-12-08 ENCOUNTER — Encounter: Payer: Self-pay | Admitting: Internal Medicine

## 2018-12-08 VITALS — BP 138/68 | HR 67 | Temp 97.8°F | Ht 63.0 in | Wt 156.0 lb

## 2018-12-08 DIAGNOSIS — M159 Polyosteoarthritis, unspecified: Secondary | ICD-10-CM

## 2018-12-08 DIAGNOSIS — E663 Overweight: Secondary | ICD-10-CM

## 2018-12-08 DIAGNOSIS — H353130 Nonexudative age-related macular degeneration, bilateral, stage unspecified: Secondary | ICD-10-CM

## 2018-12-08 DIAGNOSIS — M545 Low back pain, unspecified: Secondary | ICD-10-CM

## 2018-12-08 DIAGNOSIS — I10 Essential (primary) hypertension: Secondary | ICD-10-CM

## 2018-12-08 DIAGNOSIS — E039 Hypothyroidism, unspecified: Secondary | ICD-10-CM

## 2018-12-08 DIAGNOSIS — G8929 Other chronic pain: Secondary | ICD-10-CM

## 2018-12-08 DIAGNOSIS — Z636 Dependent relative needing care at home: Secondary | ICD-10-CM

## 2018-12-08 DIAGNOSIS — Z6827 Body mass index (BMI) 27.0-27.9, adult: Secondary | ICD-10-CM

## 2018-12-08 NOTE — Patient Instructions (Addendum)
I recommend you take tylenol rather than ibuprofen for pain.   You may also try heat for your back pain overnight.  You may also want to try some PT exercises for your back to stretch.    Chronic Back Pain When back pain lasts longer than 3 months, it is called chronic back pain.The cause of your back pain may not be known. Some common causes include:  Wear and tear (degenerative disease) of the bones, ligaments, or disks in your back.  Inflammation and stiffness in your back (arthritis). People who have chronic back pain often go through certain periods in which the pain is more intense (flare-ups). Many people can learn to manage the pain with home care. Follow these instructions at home: Pay attention to any changes in your symptoms. Take these actions to help with your pain: Activity   Avoid bending and other activities that make the problem worse.  Maintain a proper position when standing or sitting: ? When standing, keep your upper back and neck straight, with your shoulders pulled back. Avoid slouching. ? When sitting, keep your back straight and relax your shoulders. Do not round your shoulders or pull them backward.  Do not sit or stand in one place for long periods of time.  Take brief periods of rest throughout the day. This will reduce your pain. Resting in a lying or standing position is usually better than sitting to rest.  When you are resting for longer periods, mix in some mild activity or stretching between periods of rest. This will help to prevent stiffness and pain.  Get regular exercise. Ask your health care provider what activities are safe for you.  Do not lift anything that is heavier than 10 lb (4.5 kg). Always use proper lifting technique, which includes: ? Bending your knees. ? Keeping the load close to your body. ? Avoiding twisting.  Sleep on a firm mattress in a comfortable position. Try lying on your side with your knees slightly bent. If you lie on  your back, put a pillow under your knees. Managing pain  If directed, apply ice to the painful area. Your health care provider may recommend applying ice during the first 24-48 hours after a flare-up begins. ? Put ice in a plastic bag. ? Place a towel between your skin and the bag. ? Leave the ice on for 20 minutes, 2-3 times per day.  If directed, apply heat to the affected area as often as told by your health care provider. Use the heat source that your health care provider recommends, such as a moist heat pack or a heating pad. ? Place a towel between your skin and the heat source. ? Leave the heat on for 20-30 minutes. ? Remove the heat if your skin turns bright red. This is especially important if you are unable to feel pain, heat, or cold. You may have a greater risk of getting burned.  Try soaking in a warm tub.  Take over-the-counter and prescription medicines only as told by your health care provider.  Keep all follow-up visits as told by your health care provider. This is important. Contact a health care provider if:  You have pain that is not relieved with rest or medicine. Get help right away if:  You have weakness or numbness in one or both of your legs or feet.  You have trouble controlling your bladder or your bowels.  You have nausea or vomiting.  You have pain in your abdomen.  You have shortness of breath or you faint. This information is not intended to replace advice given to you by your health care provider. Make sure you discuss any questions you have with your health care provider. Document Released: 02/09/2004 Document Revised: 04/24/2018 Document Reviewed: 07/11/2016 Elsevier Patient Education  2020 [employer]  Back Exercises The following exercises strengthen the muscles that help to support the trunk and back. They also help to keep the lower back flexible. Doing these exercises can help to prevent back pain or lessen existing pain.  If you have  back pain or discomfort, try doing these exercises 2-3 times each day or as told by your health care provider.  As your pain improves, do them once each day, but increase the number of times that you repeat the steps for each exercise (do more repetitions).  To prevent the recurrence of back pain, continue to do these exercises once each day or as told by your health care provider. Do exercises exactly as told by your health care provider and adjust them as directed. It is normal to feel mild stretching, pulling, tightness, or discomfort as you do these exercises, but you should stop right away if you feel sudden pain or your pain gets worse. Exercises Single knee to chest Repeat these steps 3-5 times for each leg: 1. Lie on your back on a firm bed or the floor with your legs extended. 2. Bring one knee to your chest. Your other leg should stay extended and in contact with the floor. 3. Hold your knee in place by grabbing your knee or thigh with both hands and hold. 4. Pull on your knee until you feel a gentle stretch in your lower back or buttocks. 5. Hold the stretch for 10-30 seconds. 6. Slowly release and straighten your leg. Pelvic tilt Repeat these steps 5-10 times: 1. Lie on your back on a firm bed or the floor with your legs extended. 2. Bend your knees so they are pointing toward the ceiling and your feet are flat on the floor. 3. Tighten your lower abdominal muscles to press your lower back against the floor. This motion will tilt your pelvis so your tailbone points up toward the ceiling instead of pointing to your feet or the floor. 4. With gentle tension and even breathing, hold this position for 5-10 seconds. Cat-cow Repeat these steps until your lower back becomes more flexible: 1. Get into a hands-and-knees position on a firm surface. Keep your hands under your shoulders, and keep your knees under your hips. You may place padding under your knees for comfort. 2. Let your head  hang down toward your chest. Contract your abdominal muscles and point your tailbone toward the floor so your lower back becomes rounded like the back of a cat. 3. Hold this position for 5 seconds. 4. Slowly lift your head, let your abdominal muscles relax and point your tailbone up toward the ceiling so your back forms a sagging arch like the back of a cow. 5. Hold this position for 5 seconds.  Press-ups Repeat these steps 5-10 times: 1. Lie on your abdomen (face-down) on the floor. 2. Place your palms near your head, about shoulder-width apart. 3. Keeping your back as relaxed as possible and keeping your hips on the floor, slowly straighten your arms to raise the top half of your body and lift your shoulders. Do not use your back muscles to raise your upper torso. You may adjust the placement of your hands  to make yourself more comfortable. 4. Hold this position for 5 seconds while you keep your back relaxed. 5. Slowly return to lying flat on the floor.  Bridges Repeat these steps 10 times: 1. Lie on your back on a firm surface. 2. Bend your knees so they are pointing toward the ceiling and your feet are flat on the floor. Your arms should be flat at your sides, next to your body. 3. Tighten your buttocks muscles and lift your buttocks off the floor until your waist is at almost the same height as your knees. You should feel the muscles working in your buttocks and the back of your thighs. If you do not feel these muscles, slide your feet 1-2 inches farther away from your buttocks. 4. Hold this position for 3-5 seconds. 5. Slowly lower your hips to the starting position, and allow your buttocks muscles to relax completely. If this exercise is too easy, try doing it with your arms crossed over your chest. Abdominal crunches Repeat these steps 5-10 times: 1. Lie on your back on a firm bed or the floor with your legs extended. 2. Bend your knees so they are pointing toward the ceiling and your  feet are flat on the floor. 3. Cross your arms over your chest. 4. Tip your chin slightly toward your chest without bending your neck. 5. Tighten your abdominal muscles and slowly raise your trunk (torso) high enough to lift your shoulder blades a tiny bit off the floor. Avoid raising your torso higher than that because it can put too much stress on your low back and does not help to strengthen your abdominal muscles. 6. Slowly return to your starting position. Back lifts Repeat these steps 5-10 times: 1. Lie on your abdomen (face-down) with your arms at your sides, and rest your forehead on the floor. 2. Tighten the muscles in your legs and your buttocks. 3. Slowly lift your chest off the floor while you keep your hips pressed to the floor. Keep the back of your head in line with the curve in your back. Your eyes should be looking at the floor. 4. Hold this position for 3-5 seconds. 5. Slowly return to your starting position. Contact a health care provider if:  Your back pain or discomfort gets much worse when you do an exercise.  Your worsening back pain or discomfort does not lessen within 2 hours after you exercise. If you have any of these problems, stop doing these exercises right away. Do not do them again unless your health care provider says that you can. Get help right away if:  You develop sudden, severe back pain. If this happens, stop doing the exercises right away. Do not do them again unless your health care provider says that you can. This information is not intended to replace advice given to you by your health care provider. Make sure you discuss any questions you have with your health care provider. Document Released: 02/09/2004 Document Revised: 05/08/2018 Document Reviewed: 10/03/2017 Elsevier Patient Education  2020 [employer].

## 2018-12-08 NOTE — Progress Notes (Signed)
[facility]  Provider: Dr. Bufford Spikesiffany Reed   Goals of Care:  Advanced Directives 09/16/2018  Does Patient Have a Medical Advance Directive? Yes  Type of Estate agentAdvance Directive Healthcare Power of [place];Living will;Out of facility DNR (pink MOST or yellow form)  Does patient want to make changes to medical advance directive? No - Patient declined  Copy of Healthcare Power of Attorney in Chart? Yes - validated most recent copy scanned in chart (See row information)  Pre-existing out of facility DNR order (yellow form or pink MOST form) -     Chief Complaint  Patient presents with  . Medical Management of Chronic Issues    6MTH FOLLOW-UP    HPI: Patient is a 85 y.o. female seen today for medical management of chronic diseases.    [facility]   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 Daughter will help when she has to leave house. Does not trust in hiring an agency due to covid at this time. Claims the strain from caring for her husband is starting to cause daily pain in her body. Her hands and back are the most painful areas.   She eats three meals a day. She also hydrated throughout the day.   Does not sleep well at night. She is uncomfortable in bed. Will awaken in the middle of the night to take ibuprofen due to back pain. This occurs a few times a month. She has tried the salonpas with lidocaine back patches with no success.   No falls or injuries since last visit.   She is fasting today.       Past Medical History:  Diagnosis Date  . Hemorrhage of gastrointestinal tract, unspecified   . Hypertension   . Migraine   . OA (osteoarthritis)   . Thyroid cyst     Past Surgical History:  Procedure Laterality Date  . CATARACT EXTRACTION  1990   rt  . CATARACT EXTRACTION  2005   left  . REVISION TOTAL KNEE ARTHROPLASTY  2008  . THUMB ARTHROSCOPY    . TONSILLECTOMY      No Known Allergies  Outpatient Encounter Medications as of 12/08/2018  Medication Sig  . Calcium Carbonate-Vitamin D 600-400 MG-UNIT per tablet Take 2 tablets by mouth daily.  Marland Kitchen. levothyroxine (SYNTHROID) 100 MCG tablet Take 1 tablet (100 mcg total) by mouth daily.  Marland Kitchen. losartan (COZAAR) 50 MG tablet Take 1 tablet (50 mg total) by mouth daily.  . Multiple Vitamins-Minerals (PRESERVISION AREDS 2) CAPS Take 1 capsule by mouth 2 (two) times a day.   Marland Kitchen. OVER THE COUNTER MEDICATION MitoQ 2 capsules by mouth daily  . vitamin B-12 (CYANOCOBALAMIN) 1000 MCG tablet Take 1 tablet (1,000 mcg total) by mouth daily.   No facility-administered encounter medications on file as of 12/08/2018.     Review of Systems:  Review of Systems  Constitutional: Negative for activity change, appetite change and fever.  HENT: Negative for dental problem and hearing loss.   Respiratory: Negative for cough, shortness of breath and wheezing.   Cardiovascular: Negative for chest pain and palpitations.  Gastrointestinal: Negative for abdominal pain, constipation, diarrhea and nausea.  Genitourinary: Negative for dysuria, frequency, hematuria and vaginal bleeding.  Musculoskeletal: Negative for arthralgias, back pain and joint swelling.  Skin: Negative.   Neurological: Negative for dizziness and headaches.  Psychiatric/Behavioral: Positive for sleep disturbance. Negative for dysphoric mood. The patient is not nervous/anxious.  Health Maintenance  Topic Date Due  . DEXA SCAN  01/04/1995  . INFLUENZA VACCINE  08/16/2018  . TETANUS/TDAP  08/28/2023  . PNA vac Low Risk Adult  Completed    Physical Exam: Vitals:   12/08/18 0904  BP: 138/68  Pulse: 67  Temp: 97.8 F (36.6 C)  TempSrc: Oral  SpO2: 97%  Weight: 156 lb (70.8 kg)  Height: 5\' 3"  (1.6 m)   Body mass index is 27.63 kg/m. Physical Exam Vitals signs reviewed.  Constitutional:      General: She is not in acute distress.    Appearance: Normal appearance. She is normal  weight.  HENT:     Head: Normocephalic.  Neck:     Musculoskeletal: Normal range of motion and neck supple.     Thyroid: No thyromegaly or thyroid tenderness.  Cardiovascular:     Rate and Rhythm: Normal rate and regular rhythm.     Pulses: Normal pulses.     Heart sounds: Murmur present.  Pulmonary:     Effort: Pulmonary effort is normal. No respiratory distress.     Breath sounds: Normal breath sounds. No wheezing.  Abdominal:     General: Abdomen is flat. Bowel sounds are normal. There is no distension.     Palpations: Abdomen is soft.  Musculoskeletal:        General: No swelling or tenderness.     Lumbar back: She exhibits normal range of motion, no tenderness, no swelling and no spasm.  Skin:    General: Skin is warm and dry.     Capillary Refill: Capillary refill takes less than 2 seconds.  Neurological:     General: No focal deficit present.     Mental Status: She is alert and oriented to person, place, and time. Mental status is at baseline.  Psychiatric:        Mood and Affect: Mood normal.        Behavior: Behavior normal.        Thought Content: Thought content normal.        Judgment: Judgment normal.     Labs reviewed: Basic Metabolic Panel: Recent Labs    03/20/18 1153  NA 138  K 4.9  CL 104  CO2 27  GLUCOSE 102*  BUN 18  CREATININE 0.87  CALCIUM 9.8  TSH 0.62   Liver Function Tests: No results for input(s): AST, ALT, ALKPHOS, BILITOT, PROT, ALBUMIN in the last 8760 hours. No results for input(s): LIPASE, AMYLASE in the last 8760 hours. No results for input(s): AMMONIA in the last 8760 hours. CBC: Recent Labs    03/20/18 1153  WBC 5.6  NEUTROABS 4,194  HGB 14.1  HCT 40.7  MCV 88.3  PLT 169   Lipid Panel: No results for input(s): CHOL, HDL, LDLCALC, TRIG, CHOLHDL, LDLDIRECT in the last 8760 hours. Lab Results  Component Value Date   HGBA1C 5.1 08/22/2016    Procedures since last visit: No results found.  Assessment/Plan 1.  Essential hypertension, benign - blood pressure at goal <150/90 - continue losartan 50 mg daily - complete blood panel with differential/platelets- today - complete metabolic panel with GFR- today  2. Bilateral non-exudative age-related macular degeneration, unspecified stage - stable, followed by specialist - continue MitoQ 2 supplement for eyes  3. Hypothyroidism, unspecified type - stable no progression - TSH- today  4. Generalized osteoarthritis of multiple sites - discontinue using ibuprofen - may try tylenol 325-650 mg three times a day PRN for back pain - may  apply heat to lower back for pain - recommend PT exercises and stretching  5. Chronic bilateral low back pain without sciatica - same as above  6. Caregiver stress - seems to be handling stressors well - she has good family support     Labs/tests ordered:  Complete blood panel with differential/platelets, complete metabolic panel with GFR, TSH- today Next appt:  6 month follow up

## 2018-12-09 LAB — CBC WITH DIFFERENTIAL/PLATELET
Absolute Monocytes: 515 cells/uL (ref 200–950)
Basophils Absolute: 21 cells/uL (ref 0–200)
Basophils Relative: 0.4 %
Eosinophils Absolute: 120 cells/uL (ref 15–500)
Eosinophils Relative: 2.3 %
HCT: 39.7 % (ref 35.0–45.0)
Hemoglobin: 13.7 g/dL (ref 11.7–15.5)
Lymphs Abs: 1102 cells/uL (ref 850–3900)
MCH: 30.6 pg (ref 27.0–33.0)
MCHC: 34.5 g/dL (ref 32.0–36.0)
MCV: 88.8 fL (ref 80.0–100.0)
MPV: 9.9 fL (ref 7.5–12.5)
Monocytes Relative: 9.9 %
Neutro Abs: 3442 cells/uL (ref 1500–7800)
Neutrophils Relative %: 66.2 %
Platelets: 181 10*3/uL (ref 140–400)
RBC: 4.47 10*6/uL (ref 3.80–5.10)
RDW: 12.9 % (ref 11.0–15.0)
Total Lymphocyte: 21.2 %
WBC: 5.2 10*3/uL (ref 3.8–10.8)

## 2018-12-09 LAB — COMPLETE METABOLIC PANEL WITH GFR
AG Ratio: 2.5 (calc) (ref 1.0–2.5)
ALT: 17 U/L (ref 6–29)
AST: 15 U/L (ref 10–35)
Albumin: 4.3 g/dL (ref 3.6–5.1)
Alkaline phosphatase (APISO): 45 U/L (ref 37–153)
BUN: 21 mg/dL (ref 7–25)
CO2: 28 mmol/L (ref 20–32)
Calcium: 10 mg/dL (ref 8.6–10.4)
Chloride: 102 mmol/L (ref 98–110)
Creat: 0.86 mg/dL (ref 0.60–0.88)
GFR, Est African American: 70 mL/min/{1.73_m2} (ref >=60)
GFR, Est Non African American: 60 mL/min/{1.73_m2} (ref >=60)
Globulin: 1.7 g/dL (calc) — ABNORMAL LOW (ref 1.9–3.7)
Glucose, Bld: 86 mg/dL (ref 65–99)
Potassium: 4.8 mmol/L (ref 3.5–5.3)
Sodium: 136 mmol/L (ref 135–146)
Total Bilirubin: 0.5 mg/dL (ref 0.2–1.2)
Total Protein: 6 g/dL — ABNORMAL LOW (ref 6.1–8.1)

## 2018-12-09 LAB — LIPID PANEL
Cholesterol: 112 mg/dL (ref <200)
HDL: 34 mg/dL — ABNORMAL LOW (ref >=50)
LDL Cholesterol (Calc): 62 mg/dL (calc)
Non-HDL Cholesterol (Calc): 78 mg/dL (calc) (ref <130)
Total CHOL/HDL Ratio: 3.3 (calc) (ref <5.0)
Triglycerides: 80 mg/dL (ref <150)

## 2018-12-09 LAB — TSH: TSH: 4.58 mIU/L — ABNORMAL HIGH (ref 0.40–4.50)

## 2019-02-22 ENCOUNTER — Ambulatory Visit: Payer: Medicare PPO | Attending: Internal Medicine

## 2019-02-22 ENCOUNTER — Ambulatory Visit: Payer: Self-pay

## 2019-02-22 DIAGNOSIS — Z23 Encounter for immunization: Secondary | ICD-10-CM

## 2019-02-22 NOTE — Progress Notes (Signed)
   Covid-19 Vaccination Clinic  Name:  BRYNLEY CUDDEBACK    MRN: [id number] DOB: 1927/02/10  02/22/2019  Ms. Steppe was observed post Covid-19 immunization for 15 minutes without incidence. She was provided with Vaccine Information Sheet and instruction to access the V-Safe system.   Ms. Dreyfuss was instructed to call 911 with any severe reactions post vaccine: Marland Kitchen Difficulty breathing  . Swelling of your face and throat  . A fast heartbeat  . A bad rash all over your body  . Dizziness and weakness    Immunizations Administered    Name Date Dose VIS Date Route   Pfizer COVID-19 Vaccine 02/22/2019  5:56 PM 0.3 mL 12/26/2018 Intramuscular   Manufacturer: [employer], [employer]   Lot: [id number]   NDC: [address]-1

## 2019-02-23 ENCOUNTER — Ambulatory Visit: Payer: Medicare Other

## 2019-03-04 DIAGNOSIS — H353134 Nonexudative age-related macular degeneration, bilateral, advanced atrophic with subfoveal involvement: Secondary | ICD-10-CM | POA: Diagnosis not present

## 2019-03-04 DIAGNOSIS — H43813 Vitreous degeneration, bilateral: Secondary | ICD-10-CM | POA: Diagnosis not present

## 2019-03-04 DIAGNOSIS — H53413 Scotoma involving central area, bilateral: Secondary | ICD-10-CM | POA: Diagnosis not present

## 2019-03-09 ENCOUNTER — Ambulatory Visit: Payer: Self-pay

## 2019-03-16 ENCOUNTER — Other Ambulatory Visit: Payer: Self-pay | Admitting: Internal Medicine

## 2019-03-16 DIAGNOSIS — I10 Essential (primary) hypertension: Secondary | ICD-10-CM

## 2019-03-19 ENCOUNTER — Ambulatory Visit: Payer: Medicare PPO | Attending: Internal Medicine

## 2019-03-19 DIAGNOSIS — Z23 Encounter for immunization: Secondary | ICD-10-CM | POA: Insufficient documentation

## 2019-03-19 NOTE — Progress Notes (Signed)
   Covid-19 Vaccination Clinic  Name:  Donna [name]    MRN: [id number] DOB: 07-27-1927  03/19/2019  Donna [name] was observed post Covid-19 immunization for 15 minutes without incident. She was provided with Vaccine Information Sheet and instruction to access the V-Safe system.   Donna [name] was instructed to call 911 with any severe reactions post vaccine: Marland Kitchen Difficulty breathing  . Swelling of face and throat  . A fast heartbeat  . A bad rash all over body  . Dizziness and weakness   Immunizations Administered    Name Date Dose VIS Date Route   Pfizer COVID-19 Vaccine 03/19/2019  3:50 PM 0.3 mL 12/26/2018 Intramuscular   Manufacturer: [employer], [employer]   Lot: [id number]   NDC: [address]-1

## 2019-03-20 DIAGNOSIS — E039 Hypothyroidism, unspecified: Secondary | ICD-10-CM | POA: Diagnosis not present

## 2019-03-20 DIAGNOSIS — H02429 Myogenic ptosis of unspecified eyelid: Secondary | ICD-10-CM | POA: Diagnosis not present

## 2019-03-20 DIAGNOSIS — H532 Diplopia: Secondary | ICD-10-CM | POA: Diagnosis not present

## 2019-03-26 ENCOUNTER — Telehealth: Payer: Self-pay | Admitting: *Deleted

## 2019-03-26 DIAGNOSIS — H532 Diplopia: Secondary | ICD-10-CM

## 2019-03-26 DIAGNOSIS — E039 Hypothyroidism, unspecified: Secondary | ICD-10-CM

## 2019-03-26 NOTE — Telephone Encounter (Signed)
Patient called and stated that Specialist, Dr. Delynn Flavin [phone] with [facility] wants her to have labwork done here. I instructed patient to have the Provider fax [place] the order with Diagnosis.  Patient wants to know if it is ok with you to schedule an appointment here to have labs drawn.  Please Advise.

## 2019-03-26 NOTE — Telephone Encounter (Signed)
Patient notified and agreed and will call their office to get the order with diagnosis faxed to [place]. Once received will schedule a lab appointment.

## 2019-03-26 NOTE — Telephone Encounter (Signed)
That is fine as long as we get the details of which tests and the diagnosis codes and where to send results when return.

## 2019-03-27 ENCOUNTER — Other Ambulatory Visit: Payer: Self-pay | Admitting: Ophthalmology

## 2019-03-27 DIAGNOSIS — H532 Diplopia: Secondary | ICD-10-CM

## 2019-03-27 DIAGNOSIS — Q2730 Arteriovenous malformation, site unspecified: Secondary | ICD-10-CM

## 2019-03-27 NOTE — Telephone Encounter (Signed)
Received fax from [facility] Dr. Steele Sizer.  Pended Test and sent to Dr. Renato Gails for approval.   Results to be faxed to: Dr. Steele Sizer [address] [place] [address] [phone] Fax: [phone]

## 2019-04-02 ENCOUNTER — Other Ambulatory Visit: Payer: Self-pay

## 2019-04-02 ENCOUNTER — Other Ambulatory Visit: Payer: Medicare PPO

## 2019-04-02 DIAGNOSIS — E039 Hypothyroidism, unspecified: Secondary | ICD-10-CM | POA: Diagnosis not present

## 2019-04-02 DIAGNOSIS — H532 Diplopia: Secondary | ICD-10-CM

## 2019-04-03 LAB — CBC WITH DIFFERENTIAL/PLATELET
Absolute Monocytes: 581 cells/uL (ref 200–950)
Basophils Absolute: 29 cells/uL (ref 0–200)
Basophils Relative: 0.6 %
Eosinophils Absolute: 130 cells/uL (ref 15–500)
Eosinophils Relative: 2.7 %
HCT: 42 % (ref 35.0–45.0)
Hemoglobin: 14.3 g/dL (ref 11.7–15.5)
Lymphs Abs: 1142 cells/uL (ref 850–3900)
MCH: 30.5 pg (ref 27.0–33.0)
MCHC: 34 g/dL (ref 32.0–36.0)
MCV: 89.6 fL (ref 80.0–100.0)
MPV: 9.9 fL (ref 7.5–12.5)
Monocytes Relative: 12.1 %
Neutro Abs: 2918 cells/uL (ref 1500–7800)
Neutrophils Relative %: 60.8 %
Platelets: 167 10*3/uL (ref 140–400)
RBC: 4.69 10*6/uL (ref 3.80–5.10)
RDW: 12.5 % (ref 11.0–15.0)
Total Lymphocyte: 23.8 %
WBC: 4.8 10*3/uL (ref 3.8–10.8)

## 2019-04-03 LAB — SEDIMENTATION RATE: Sed Rate: 2 mm/h (ref 0–30)

## 2019-04-03 LAB — BASIC METABOLIC PANEL
BUN: 17 mg/dL (ref 7–25)
CO2: 29 mmol/L (ref 20–32)
Calcium: 10.2 mg/dL (ref 8.6–10.4)
Chloride: 104 mmol/L (ref 98–110)
Creat: 0.88 mg/dL (ref 0.60–0.88)
Glucose, Bld: 95 mg/dL (ref 65–99)
Potassium: 4.5 mmol/L (ref 3.5–5.3)
Sodium: 138 mmol/L (ref 135–146)

## 2019-04-03 LAB — C-REACTIVE PROTEIN: CRP: 0.2 mg/L (ref <8.0)

## 2019-04-03 NOTE — Progress Notes (Signed)
Blood counts, electrolytes and kidneys are normal. Sed rate and C reactive protein that were requested were normal.

## 2019-04-09 DIAGNOSIS — H353134 Nonexudative age-related macular degeneration, bilateral, advanced atrophic with subfoveal involvement: Secondary | ICD-10-CM | POA: Diagnosis not present

## 2019-04-13 ENCOUNTER — Other Ambulatory Visit: Payer: Medicare PPO

## 2019-04-13 NOTE — Telephone Encounter (Addendum)
[facility]oke with [facility] and some of the labs can not be done by [facility]. Spoke with Dr. Delynn Flavin office and they will have pt get labs at [facility].

## 2019-04-13 NOTE — Telephone Encounter (Signed)
For the records:  All orders had been placed in epic, but were not drawn by [facility] on site as ordered.  We were not informed that one was not able to be done by [facility] at all until today when investigating the situation.

## 2019-04-14 ENCOUNTER — Other Ambulatory Visit: Payer: Self-pay

## 2019-04-14 ENCOUNTER — Ambulatory Visit
Admission: RE | Admit: 2019-04-14 | Discharge: 2019-04-14 | Disposition: A | Discharge status: Home / Self Care | Payer: Medicare PPO | Source: Ambulatory Visit | Attending: Ophthalmology | Admitting: Ophthalmology

## 2019-04-14 DIAGNOSIS — H547 Unspecified visual loss: Secondary | ICD-10-CM | POA: Diagnosis not present

## 2019-04-14 DIAGNOSIS — E039 Hypothyroidism, unspecified: Secondary | ICD-10-CM | POA: Diagnosis not present

## 2019-04-14 DIAGNOSIS — Q2730 Arteriovenous malformation, site unspecified: Secondary | ICD-10-CM

## 2019-04-14 IMAGING — CT CT ANGIO HEAD
1 of 4 series · 4 of 30 positions shown · IV contrast (iopamidol)
Comparison: None.

CLINICAL DATA: Question cavernous carotid fistula. Vision loss.
Macular degeneration.

EXAM:
CT ANGIOGRAPHY HEAD
TECHNIQUE: Multidetector CT imaging of the head was performed using the
standard protocol during bolus administration of intravenous
contrast. Multiplanar CT image reconstructions and MIPs were
obtained to evaluate the vascular anatomy.
CONTRAST:  75mL [5F] IOPAMIDOL ([5F]) INJECTION 76%

[Series 8: head angio · axial · 0.41mm/px · z∈[-150,-57]mm · 4 of 53 slices shown]
[im 11/53  brain]
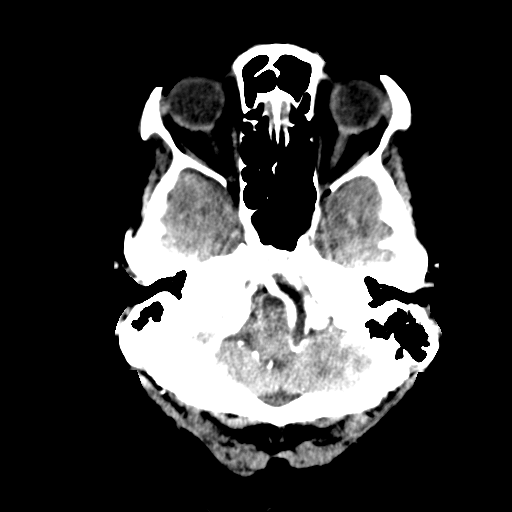
[im 21/53  bone]
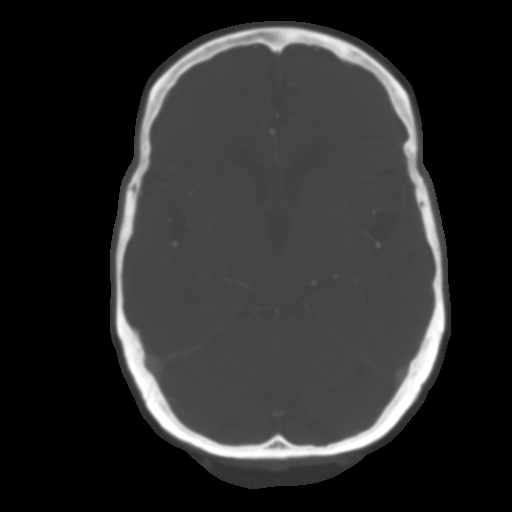
[im 32/53  brain]
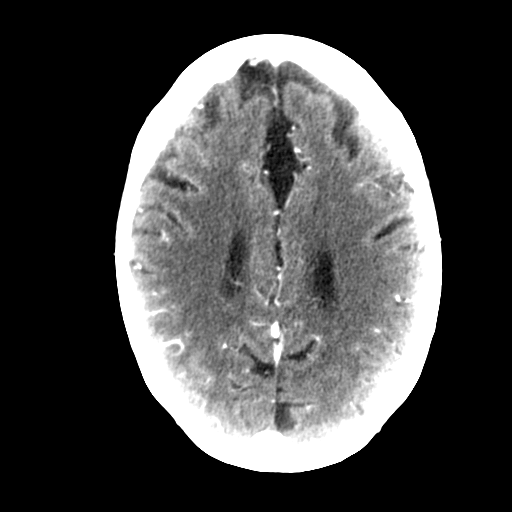
[im 42/53  bone]
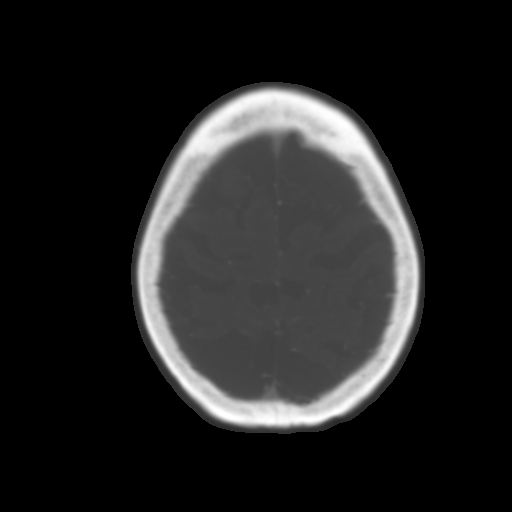

[4 of 30 positions shown; findings below may reference images not displayed]

FINDINGS: CT HEAD

Brain: Age related atrophy. Mild chronic small-vessel change of the
hemispheric white matter. No evidence of recent infarction, large
vessel territory infarction, mass, hemorrhage, hydrocephalus or
extra-axial collection

Vascular: There is atherosclerotic calcification of the major
vessels at the base of the brain.

Skull: Negative

Sinuses: Clear

Orbits: Negative

CTA HEAD

Anterior circulation: Both internal carotid arteries are patent
through the skull base and siphon regions. There is ordinary siphon
atherosclerotic calcification but no sign of stenosis or aneurysm.
There is not early or abnormal filling of the cavernous sinus
regions to suggest carotid cavernous fistula. The anterior and
middle cerebral vessels are patent and normal.

Posterior circulation: Both vertebral arteries are patent to the
basilar with the left being dominant. No basilar stenosis. Posterior
circulation branch vessels are normal.

Venous sinuses: Patent and normal.

Anatomic variants: None significant. No enlarged orbital vascular
structures.
IMPRESSION: No evidence of carotid cavernous fistula. No early filling of the
cavernous sinus. No enlarged orbital veins by CT.

Ordinary siphon atherosclerotic calcification, universally present
at this age. No sign of aneurysm or stenosis.

Head CT shows generalized atrophy with mild chronic small-vessel
change of the white matter.

## 2019-04-14 MED ORDER — IOPAMIDOL (ISOVUE-370) INJECTION 76%
75.0000 mL | Freq: Once | INTRAVENOUS | Status: AC | PRN
  Administered 2019-04-14: 75 mL via INTRAVENOUS

## 2019-04-29 DIAGNOSIS — H532 Diplopia: Secondary | ICD-10-CM | POA: Diagnosis not present

## 2019-04-29 DIAGNOSIS — H53481 Generalized contraction of visual field, right eye: Secondary | ICD-10-CM | POA: Diagnosis not present

## 2019-04-29 DIAGNOSIS — H53483 Generalized contraction of visual field, bilateral: Secondary | ICD-10-CM | POA: Diagnosis not present

## 2019-04-29 DIAGNOSIS — E039 Hypothyroidism, unspecified: Secondary | ICD-10-CM | POA: Diagnosis not present

## 2019-04-29 DIAGNOSIS — H02429 Myogenic ptosis of unspecified eyelid: Secondary | ICD-10-CM | POA: Diagnosis not present

## 2019-04-29 DIAGNOSIS — H53482 Generalized contraction of visual field, left eye: Secondary | ICD-10-CM | POA: Diagnosis not present

## 2019-05-04 ENCOUNTER — Other Ambulatory Visit: Payer: Self-pay | Admitting: Internal Medicine

## 2019-05-04 DIAGNOSIS — E041 Nontoxic single thyroid nodule: Secondary | ICD-10-CM

## 2019-05-04 DIAGNOSIS — E038 Other specified hypothyroidism: Secondary | ICD-10-CM

## 2019-05-04 NOTE — Telephone Encounter (Signed)
rx sent to pharmacy by e-script  

## 2019-05-07 DIAGNOSIS — H532 Diplopia: Secondary | ICD-10-CM | POA: Diagnosis not present

## 2019-05-07 DIAGNOSIS — H353134 Nonexudative age-related macular degeneration, bilateral, advanced atrophic with subfoveal involvement: Secondary | ICD-10-CM | POA: Diagnosis not present

## 2019-05-26 DIAGNOSIS — H3561 Retinal hemorrhage, right eye: Secondary | ICD-10-CM | POA: Diagnosis not present

## 2019-05-26 DIAGNOSIS — H43813 Vitreous degeneration, bilateral: Secondary | ICD-10-CM | POA: Diagnosis not present

## 2019-05-26 DIAGNOSIS — H04123 Dry eye syndrome of bilateral lacrimal glands: Secondary | ICD-10-CM | POA: Diagnosis not present

## 2019-05-26 DIAGNOSIS — H353134 Nonexudative age-related macular degeneration, bilateral, advanced atrophic with subfoveal involvement: Secondary | ICD-10-CM | POA: Diagnosis not present

## 2019-05-26 DIAGNOSIS — H02831 Dermatochalasis of right upper eyelid: Secondary | ICD-10-CM | POA: Diagnosis not present

## 2019-06-04 ENCOUNTER — Other Ambulatory Visit: Payer: Medicare PPO

## 2019-06-04 ENCOUNTER — Other Ambulatory Visit: Payer: Self-pay

## 2019-06-04 DIAGNOSIS — E039 Hypothyroidism, unspecified: Secondary | ICD-10-CM | POA: Diagnosis not present

## 2019-06-04 DIAGNOSIS — I10 Essential (primary) hypertension: Secondary | ICD-10-CM | POA: Diagnosis not present

## 2019-06-05 LAB — COMPLETE METABOLIC PANEL WITH GFR
AG Ratio: 2.3 (calc) (ref 1.0–2.5)
ALT: 18 U/L (ref 6–29)
AST: 14 U/L (ref 10–35)
Albumin: 4.2 g/dL (ref 3.6–5.1)
Alkaline phosphatase (APISO): 44 U/L (ref 37–153)
BUN/Creatinine Ratio: 17 (calc) (ref 6–22)
BUN: 15 mg/dL (ref 7–25)
CO2: 28 mmol/L (ref 20–32)
Calcium: 10.3 mg/dL (ref 8.6–10.4)
Chloride: 102 mmol/L (ref 98–110)
Creat: 0.89 mg/dL — ABNORMAL HIGH (ref 0.60–0.88)
GFR, Est African American: 67 mL/min/{1.73_m2} (ref >=60)
GFR, Est Non African American: 57 mL/min/{1.73_m2} — ABNORMAL LOW (ref >=60)
Globulin: 1.8 g/dL (calc) — ABNORMAL LOW (ref 1.9–3.7)
Glucose, Bld: 99 mg/dL (ref 65–99)
Potassium: 5 mmol/L (ref 3.5–5.3)
Sodium: 137 mmol/L (ref 135–146)
Total Bilirubin: 0.5 mg/dL (ref 0.2–1.2)
Total Protein: 6 g/dL — ABNORMAL LOW (ref 6.1–8.1)

## 2019-06-05 LAB — CBC WITH DIFFERENTIAL/PLATELET
Absolute Monocytes: 497 cells/uL (ref 200–950)
Basophils Absolute: 22 cells/uL (ref 0–200)
Basophils Relative: 0.5 %
Eosinophils Absolute: 110 cells/uL (ref 15–500)
Eosinophils Relative: 2.5 %
HCT: 41.9 % (ref 35.0–45.0)
Hemoglobin: 14.1 g/dL (ref 11.7–15.5)
Lymphs Abs: 968 cells/uL (ref 850–3900)
MCH: 29.7 pg (ref 27.0–33.0)
MCHC: 33.7 g/dL (ref 32.0–36.0)
MCV: 88.2 fL (ref 80.0–100.0)
MPV: 10 fL (ref 7.5–12.5)
Monocytes Relative: 11.3 %
Neutro Abs: 2803 cells/uL (ref 1500–7800)
Neutrophils Relative %: 63.7 %
Platelets: 175 10*3/uL (ref 140–400)
RBC: 4.75 10*6/uL (ref 3.80–5.10)
RDW: 13.1 % (ref 11.0–15.0)
Total Lymphocyte: 22 %
WBC: 4.4 10*3/uL (ref 3.8–10.8)

## 2019-06-05 LAB — TSH: TSH: 3.69 mIU/L (ref 0.40–4.50)

## 2019-06-08 ENCOUNTER — Other Ambulatory Visit: Payer: Self-pay

## 2019-06-08 ENCOUNTER — Encounter: Payer: Self-pay | Admitting: Internal Medicine

## 2019-06-08 ENCOUNTER — Ambulatory Visit (INDEPENDENT_AMBULATORY_CARE_PROVIDER_SITE_OTHER): Payer: Medicare PPO | Admitting: Internal Medicine

## 2019-06-08 VITALS — BP 118/60 | HR 54 | Temp 97.1°F | Wt 150.0 lb

## 2019-06-08 DIAGNOSIS — Z0000 Encounter for general adult medical examination without abnormal findings: Secondary | ICD-10-CM | POA: Diagnosis not present

## 2019-06-08 DIAGNOSIS — I10 Essential (primary) hypertension: Secondary | ICD-10-CM | POA: Diagnosis not present

## 2019-06-08 DIAGNOSIS — E039 Hypothyroidism, unspecified: Secondary | ICD-10-CM

## 2019-06-08 DIAGNOSIS — Z636 Dependent relative needing care at home: Secondary | ICD-10-CM | POA: Diagnosis not present

## 2019-06-08 DIAGNOSIS — G2581 Restless legs syndrome: Secondary | ICD-10-CM

## 2019-06-08 DIAGNOSIS — M159 Polyosteoarthritis, unspecified: Secondary | ICD-10-CM

## 2019-06-08 NOTE — Progress Notes (Addendum)
Provider:  Gwenith Spitz. Renato Gails, D.O., C.M.D. Location:   [facility]  Place of Service:   clinic  Previous PCP: Kermit Balo, DO Patient Care Team: Kermit Balo, DO as PCP - General (Geriatric Medicine) Everardo Pacific, OD as Consulting Physician (Optometry) Awilda Bill., MD as Referring Physician (Orthopedic Surgery)  Extended Emergency Contact Information Primary Emergency Contact: Jeri Lager of [place] Home Phone: [phone] Relation: Daughter Secondary Emergency Contact: Speaks,Amy Mobile Phone: [phone] Relation: Daughter  Code Status: DNR Goals of Care: Advanced Directive information Advanced Directives 06/08/2019  Does Patient Have a Medical Advance Directive? Yes  Type of Advance Directive Healthcare Power of Attorney  Does patient want to make changes to medical advance directive? No - Patient declined  Copy of Healthcare Power of Attorney in Chart? -  Pre-existing out of facility DNR order (yellow form or pink MOST form) Pink MOST/Yellow Form most recent copy in chart - Physician notified to receive inpatient order      Chief Complaint  Patient presents with  . Medical Management of Chronic Issues    CPE    HPI: Patient is a 86 y.o. female seen today for an annual physical exam.  She is stressed--husband broke right hip in jan and recovered, but then left and not doing well.  She says it's more emotional than physical.   Daughters, granddaughters and first choice are helping him.    Restless legs are acting up.  She's up at night b/c she has to sleep where her husband can touch her or he will not rest--he's on new medication and resting a little better.  She had tried gabapentin and lyrica and helped a little at first and then quit working.  She has RLS every night for a while, then none for a bit.  She used to think it was related to walking a lot in the day, but not true anymore and still happens.  May not begin right after she goes to  bed--might happen in middle of night if she wakes up.  She had seen her specialist due to her diplopia.  She's going to get her droopy lid fixed.  If lid is raised, her peripheral vision is improved considerably.    Past Medical History:  Diagnosis Date  . Hemorrhage of gastrointestinal tract, unspecified   . Hypertension   . Migraine   . OA (osteoarthritis)   . Thyroid cyst    Past Surgical History:  Procedure Laterality Date  . CATARACT EXTRACTION  1990   rt  . CATARACT EXTRACTION  2005   left  . REVISION TOTAL KNEE ARTHROPLASTY  2008  . THUMB ARTHROSCOPY    . TONSILLECTOMY      reports that she quit smoking about 71 years ago. Her smoking use included cigarettes. She quit after 2.00 years of use. She has never used smokeless tobacco. She reports previous alcohol use. She reports that she does not use drugs.  Functional Status Survey:    Family History  Problem Relation Age of Onset  . CVA Father 61  . Pancreatic cancer Mother   . CVA Sister 76  . Brain cancer Brother   . Arthritis Sister   . Breast cancer Neg Hx     Health Maintenance  Topic Date Due  . DEXA SCAN  Never done  . INFLUENZA VACCINE  08/16/2019  . TETANUS/TDAP  08/28/2023  . COVID-19 Vaccine  Completed  . PNA vac Low Risk Adult  Completed    Not  on File  Outpatient Encounter Medications as of 06/08/2019  Medication Sig  . Calcium Carbonate-Vitamin D 600-400 MG-UNIT per tablet Take 2 tablets by mouth daily.  Marland Kitchen losartan (COZAAR) 50 MG tablet TAKE 1 TABLET(50 MG) BY MOUTH DAILY  . Multiple Vitamins-Minerals (PRESERVISION AREDS 2) CAPS Take 1 capsule by mouth 2 (two) times a day.   Marland Kitchen OVER THE COUNTER MEDICATION MitoQ 2 capsules by mouth daily  . SYNTHROID 100 MCG tablet TAKE 1 TABLET(100 MCG) BY MOUTH DAILY  . vitamin B-12 (CYANOCOBALAMIN) 1000 MCG tablet Take 1 tablet (1,000 mcg total) by mouth daily.   No facility-administered encounter medications on file as of 06/08/2019.    Review of Systems   Constitutional: Positive for malaise/fatigue. Negative for chills, fever and weight loss.  HENT: Negative for hearing loss.   Eyes: Positive for double vision. Negative for blurred vision.  Respiratory: Negative for cough and shortness of breath.   Cardiovascular: Negative for chest pain, palpitations and leg swelling.  Gastrointestinal: Negative for abdominal pain, blood in stool, constipation, diarrhea and melena.  Genitourinary: Positive for frequency. Negative for dysuria.       Getting up some more at night with age  Musculoskeletal: Positive for falls. Negative for joint pain.  Skin: Negative for itching and rash.       Ecchymoses back of left upper arm and left upper ribs from fall trying to grab her cat  Neurological: Negative for dizziness and loss of consciousness.  Endo/Heme/Allergies: Bruises/bleeds easily.  Psychiatric/Behavioral: Negative for depression and memory loss. The patient is not nervous/anxious and does not have insomnia.     Vitals:   06/08/19 0959  BP: 118/60  Pulse: 99  Temp: (!) 97.1 F (36.2 C)  TempSrc: Temporal  SpO2: (!) 54%  Weight: 150 lb (68 kg)   Body mass index is 26.57 kg/m. Physical Exam Vitals reviewed.  Constitutional:      General: She is not in acute distress.    Appearance: Normal appearance. She is not ill-appearing or toxic-appearing.  HENT:     Head: Normocephalic and atraumatic.     Right Ear: Tympanic membrane, ear canal and external ear normal.     Left Ear: Tympanic membrane, ear canal and external ear normal.     Nose: Nose normal. No congestion.     Mouth/Throat:     Pharynx: Oropharynx is clear. No oropharyngeal exudate.  Eyes:     Extraocular Movements: Extraocular movements intact.     Conjunctiva/sclera: Conjunctivae normal.     Pupils: Pupils are equal, round, and reactive to light.     Comments: Droopy lid right; left lower lid with small stye  Cardiovascular:     Rate and Rhythm: Normal rate and regular  rhythm.     Heart sounds: Murmur present.  Pulmonary:     Effort: Pulmonary effort is normal.     Breath sounds: Normal breath sounds. No wheezing, rhonchi or rales.  Chest:     Breasts:        Right: Normal.        Left: Normal.  Abdominal:     General: Bowel sounds are normal. There is no distension.     Palpations: Abdomen is soft. There is no mass.     Tenderness: There is no abdominal tenderness. There is no guarding or rebound.  Musculoskeletal:        General: Normal range of motion.     Cervical back: Neck supple.     Right lower leg:  No edema.     Left lower leg: No edema.  Lymphadenopathy:     Cervical: No cervical adenopathy.     Upper Body:     Right upper body: No supraclavicular, axillary or pectoral adenopathy.     Left upper body: No supraclavicular, axillary or pectoral adenopathy.  Skin:    General: Skin is warm and dry.     Capillary Refill: Capillary refill takes less than 2 seconds.     Coloration: Skin is pale.     Comments: Ecchymoses left ribs and left posterior arm  Neurological:     General: No focal deficit present.     Mental Status: She is alert and oriented to person, place, and time.     Cranial Nerves: No cranial nerve deficit.     Motor: No weakness.     Gait: Gait normal.  Psychiatric:        Mood and Affect: Mood normal.        Behavior: Behavior normal.     Labs reviewed: Basic Metabolic Panel: Recent Labs    12/08/18 1004 04/02/19 0821 06/04/19 0810  NA 136 138 137  K 4.8 4.5 5.0  CL 102 104 102  CO2 28 29 28   GLUCOSE 86 95 99  BUN 21 17 15   CREATININE 0.86 0.88 0.89*  CALCIUM 10.0 10.2 10.3   Liver Function Tests: Recent Labs    12/08/18 1004 06/04/19 0810  AST 15 14  ALT 17 18  BILITOT 0.5 0.5  PROT 6.0* 6.0*   No results for input(s): LIPASE, AMYLASE in the last 8760 hours. No results for input(s): AMMONIA in the last 8760 hours. CBC: Recent Labs    12/08/18 1004 04/02/19 0821 06/04/19 0810  WBC 5.2 4.8  4.4  NEUTROABS 3,442 2,918 2,803  HGB 13.7 14.3 14.1  HCT 39.7 42.0 41.9  MCV 88.8 89.6 88.2  PLT 181 167 175   Cardiac Enzymes: No results for input(s): CKTOTAL, CKMB, CKMBINDEX, TROPONINI in the last 8760 hours. BNP: Invalid input(s): POCBNP Lab Results  Component Value Date   HGBA1C 5.1 08/22/2016   Lab Results  Component Value Date   TSH 3.69 06/04/2019   Lab Results  Component Value Date   VITAMINB12 364 09/03/2016   Assessment/Plan 1. Annual physical exam - performed today, doing well overall - CBC with Differential/Platelet; Future - Basic metabolic panel; Future - Hepatic function panel; Future - Lipid panel; Future - TSH; Future  2. Restless legs syndrome (RLS) - she is interested in trying coq10 1200mg  daily at this point  3. Acquired hypothyroidism -cont current levothyroxine, tsh in nl range  4. Generalized osteoarthritis of multiple sites -stable at this time  5. Caregiver stress -worse lately b/c husband has not recovered well after second hip fx and has advanced dementia  6. Hypothyroidism, unspecified type - cont current levothyroxine  - TSH; Future  7. Essential hypertension, benign -bp at goal with current therapy, cont same and monitor - CBC with Differential/Platelet; Future - Basic metabolic panel; Future - Hepatic function panel; Future  Labs/tests ordered:   Lab Orders     CBC with Differential/Platelet     Basic metabolic panel     Hepatic function panel     Lipid panel     TSH  F/u in 1 year for CPE, fasting labs before and prn   Alyscia Carmon L. Dorleen Kissel, D.O. [facility] [address], [place] [address] Cell Phone (Mon-Fri 8am-5pm):  [phone] On Call:  [phone] & follow prompts after 5pm & weekends Office Phone:  [phone] Office Fax:  [phone]

## 2019-06-08 NOTE — Patient Instructions (Addendum)
Try taking CoQ10 1200mg  daily for a few weeks to see if it will decrease your restless leg syndrome.

## 2019-06-08 NOTE — Progress Notes (Signed)
Labs are stable.  We'll discuss at her appt.

## 2019-06-17 DIAGNOSIS — H53481 Generalized contraction of visual field, right eye: Secondary | ICD-10-CM | POA: Diagnosis not present

## 2019-06-17 DIAGNOSIS — H532 Diplopia: Secondary | ICD-10-CM | POA: Diagnosis not present

## 2019-06-17 DIAGNOSIS — H02421 Myogenic ptosis of right eyelid: Secondary | ICD-10-CM | POA: Diagnosis not present

## 2019-06-17 DIAGNOSIS — H02411 Mechanical ptosis of right eyelid: Secondary | ICD-10-CM | POA: Diagnosis not present

## 2019-08-20 ENCOUNTER — Other Ambulatory Visit: Payer: Self-pay

## 2019-08-20 ENCOUNTER — Encounter: Payer: Self-pay | Admitting: Internal Medicine

## 2019-08-20 ENCOUNTER — Ambulatory Visit (INDEPENDENT_AMBULATORY_CARE_PROVIDER_SITE_OTHER): Payer: Medicare PPO | Admitting: Internal Medicine

## 2019-08-20 VITALS — Temp 97.5°F | Ht 63.0 in | Wt 155.3 lb

## 2019-08-20 DIAGNOSIS — R42 Dizziness and giddiness: Secondary | ICD-10-CM

## 2019-08-20 DIAGNOSIS — M542 Cervicalgia: Secondary | ICD-10-CM | POA: Diagnosis not present

## 2019-08-20 DIAGNOSIS — I952 Hypotension due to drugs: Secondary | ICD-10-CM

## 2019-08-20 DIAGNOSIS — M79602 Pain in left arm: Secondary | ICD-10-CM | POA: Diagnosis not present

## 2019-08-20 DIAGNOSIS — F4321 Adjustment disorder with depressed mood: Secondary | ICD-10-CM | POA: Diagnosis not present

## 2019-08-20 MED ORDER — BLOOD PRESSURE MONITORING KIT: PACK | 0 refills | Status: DC

## 2019-08-20 NOTE — Progress Notes (Signed)
Location:  [facility] clinic Provider: Tiffany L. Mariea Clonts, D.O., C.M.D.  Code Status: DNR Goals of Care:  Advanced Directives 08/20/2019  Does Patient Have a Medical Advance Directive? Yes  Type of Advance Directive Out of facility DNR (pink MOST or yellow form)  Does patient want to make changes to medical advance directive? No - Patient declined  Copy of [place] in Chart? -  Pre-existing out of facility DNR order (yellow form or pink MOST form) Pink MOST/Yellow Form most recent copy in chart - Physician notified to receive inpatient order   Chief Complaint  Patient presents with  . Acute Visit    Dizzinex x 2 weeks of and on. bp has been running low     HPI: Patient is a 86 y.o. female seen today for an acute visit for dizziness off and on for 2 weeks with low bp only as far as she knows since Sunday (4 days ago).  Her balance has been worse, too.  She'd had a bad episode of vertigo Apr 18, 2022.    Her husband died July 21, 2022 after he'd broken both hips over the past year.  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  They'd been married a week short of 11 years.  He'd always been active and was so restricted for a full year.    110/62 here upon arrival by cma.  80-90 over 55 at home with her really old arm cuff.  She did not take her bp pill last night--losartan 9m.  She's not been dizzy this morning w/o it in her.  Sometimes dizziness can be when she's still.  It will be a short wave of it and goes away.  Does not notice it necessarily when she stands up.  May feel brief waves when walking.  It is not a spinning sensation.  No new changes to vision.  No numbness, tingling, weakness, speech changes when this happens.    She had eyelid surgery in June and cannot wear contacts.  Vision not as good with the glasses.   158/60 after our discussion.  Then orthostatics done by CMA recorded showed hypertension.  A couple of times before her husband died and several times afterward,  she will feel off.  Her arms feel strange and she will feel her heart beat very rapidly.  She has to suddenly sit down.  It's very uncomfortable and it hurts in her left neck and left elbow.    Past Medical History:  Diagnosis Date  . Hemorrhage of gastrointestinal tract, unspecified   . Hypertension   . Migraine   . OA (osteoarthritis)   . Thyroid cyst     Past Surgical History:  Procedure Laterality Date  . CATARACT EXTRACTION  1990   rt  . CATARACT EXTRACTION  2005   left  . REVISION TOTAL KNEE ARTHROPLASTY  2008  . THUMB ARTHROSCOPY    . TONSILLECTOMY      No Known Allergies  Outpatient Encounter Medications as of 08/20/2019  Medication Sig  . losartan (COZAAR) 50 MG tablet TAKE 1 TABLET(50 MG) BY MOUTH DAILY  . Multiple Vitamins-Minerals (PRESERVISION AREDS 2) CAPS Take 1 capsule by mouth 2 (two) times a day.   .Marland KitchenOVER THE COUNTER MEDICATION MitoQ 2 capsules by mouth daily  . SYNTHROID 100 MCG tablet TAKE 1 TABLET(100 MCG) BY MOUTH DAILY  . vitamin B-12 (CYANOCOBALAMIN) 1000 MCG tablet Take 1 tablet (1,000 mcg total) by mouth daily.  . Calcium Carbonate-Vitamin  D 600-400 MG-UNIT per tablet Take 2 tablets by mouth daily.   No facility-administered encounter medications on file as of 08/20/2019.    Review of Systems:  Review of Systems  Constitutional: Positive for malaise/fatigue. Negative for chills and fever.  HENT: Negative.  Negative for congestion and sore throat.   Eyes: Positive for blurred vision and double vision.       Not new  Respiratory: Negative for cough and shortness of breath.   Cardiovascular: Positive for palpitations. Negative for chest pain, orthopnea, leg swelling and PND.  Gastrointestinal: Negative for abdominal pain.  Genitourinary: Negative for dysuria.  Musculoskeletal: Negative for falls.  Skin: Negative for rash.  Neurological: Positive for dizziness. Negative for loss of consciousness.       Episodes of weakness  Endo/Heme/Allergies:  Bruises/bleeds easily.  Psychiatric/Behavioral: Negative for memory loss.       Grieving loss of her husband    Health Maintenance  Topic Date Due  . DEXA SCAN  Never done  . INFLUENZA VACCINE  08/16/2019  . TETANUS/TDAP  08/28/2023  . COVID-19 Vaccine  Completed  . PNA vac Low Risk Adult  Completed    Physical Exam: Vitals:   08/20/19 0901  BP: 110/62  Pulse: 69  Temp: (!) 97.5 F (36.4 C)  TempSrc: Temporal  SpO2: 96%  Weight: 155 lb 4.8 oz (70.4 kg)  Height: 5' 3" (1.6 m)   Body mass index is 27.51 kg/m. Physical Exam Vitals reviewed.  Constitutional:      General: She is not in acute distress.    Appearance: Normal appearance. She is not toxic-appearing.     Comments: Does not seem herself today  HENT:     Head: Normocephalic and atraumatic.  Cardiovascular:     Rate and Rhythm: Normal rate and regular rhythm.     Heart sounds: Murmur heard.      Comments: 3/6 systolic murmur more prominent today and audible throughout the precordium Pulmonary:     Effort: Pulmonary effort is normal.     Breath sounds: Normal breath sounds. No wheezing, rhonchi or rales.  Abdominal:     General: Bowel sounds are normal.  Musculoskeletal:        General: Normal range of motion.     Cervical back: Neck supple.     Right lower leg: Edema present.     Left lower leg: Edema present.     Comments: 1+ edema  Skin:    General: Skin is warm and dry.  Neurological:     General: No focal deficit present.     Mental Status: She is alert and oriented to person, place, and time.     Motor: No weakness.     Gait: Gait normal.  Psychiatric:     Comments: Tearful discussing husband's decline and death     Labs reviewed: Basic Metabolic Panel: Recent Labs    12/08/18 1004 04/02/19 0821 06/04/19 0810  NA 136 138 137  K 4.8 4.5 5.0  CL 102 104 102  CO2 _0 GLUCOSE 86 95 99  BUN _1 CREATININE 0.86 0.88 0.89*  CALCIUM 10.0 10.2 10.3  TSH 4.58*  --  3.69    Liver Function Tests: Recent Labs    12/08/18 1004 06/04/19 0810  AST 15 14  ALT 17 18  BILITOT 0.5 0.5  PROT 6.0* 6.0*   No results for input(s): LIPASE, AMYLASE in the last 8760 hours. No results for input(s): AMMONIA in  the last 8760 hours. CBC: Recent Labs    12/08/18 1004 04/02/19 0821 06/04/19 0810  WBC 5.2 4.8 4.4  NEUTROABS 3,442 2,918 2,803  HGB 13.7 14.3 14.1  HCT 39.7 42.0 41.9  MCV 88.8 89.6 88.2  PLT 181 167 175   Lipid Panel: Recent Labs    12/08/18 1004  CHOL 112  HDL 34*  LDLCALC 62  TRIG 80  CHOLHDL 3.3   Lab Results  Component Value Date   HGBA1C 5.1 08/22/2016   Assessment/Plan 1. Dizziness - I'm concerned this is either valvular or an arrhythmia - EKG 12-Lead today was unchanged from prior with old anterior MI and LBBB, sinus rhythm and NO ACTIVE SYMPTOMS - Blood Pressure Monitoring KIT; Essential hypertension check bp daily  Dispense: 1 kit; Refill: 0 - Ambulatory referral to Cardiology  2. Hypotension due to drugs - unclear if bp cuff at home accurate and bp upon arrival w/o losartan given how much higher it was when I checked it and second cma did orthostatics (also was talking about losing husband before this so many confounders) - EKG 12-Lead - Blood Pressure Monitoring KIT; Essential hypertension check bp daily  Dispense: 1 kit; Refill: 0 - Ambulatory referral to Cardiology -continue off losartan for now -get new bp cuff and check bp daily -call me if staying over 140/90 after 3 days -if over 160 tomorrow, call back then  3. Grief -with loss of husband just about 6 wks ago, she's grieving but seems to be managing ok  4. Neck pain on left side - occasional and associated with palpitations and some left arm pain, too; concerning for angina vs progression of AS? Vs afib or other arrhythmia - EKG 12-Lead did not show acute ischemia or infarct and no active symptoms now - Ambulatory referral to Cardiology--[facility]  5. Pain of left arm - as in #4, concerning for angina - EKG 12-Lead - Ambulatory referral to Cardiology--[facility] please  Labs/tests ordered:  EKG, orthostatics, cardiology  Next appt:  09/22/2019  Donna [name] L. Lavonne Cass, D.O. [facility] [address], [place] [address] Cell Phone (Mon-Fri 8am-5pm):  [phone] On Call:  [phone] & follow prompts after 5pm & weekends Office Phone:  [phone] Office Fax:  [phone]

## 2019-08-20 NOTE — Patient Instructions (Addendum)
Check your blood pressure daily. I will get you into cardiology asap to check your valves and figure out your episodes of heart racing, left neck and elbow pain.   If BP is staying over 140/90 consistently at home after 3 days, please call me.

## 2019-08-26 ENCOUNTER — Ambulatory Visit: Payer: Medicare PPO | Admitting: Cardiology

## 2019-08-26 ENCOUNTER — Other Ambulatory Visit: Payer: Self-pay

## 2019-08-26 ENCOUNTER — Encounter: Payer: Self-pay | Admitting: Cardiology

## 2019-08-26 ENCOUNTER — Ambulatory Visit: Payer: Medicare PPO

## 2019-08-26 VITALS — BP 152/70 | HR 71 | Resp 16 | Ht 63.0 in | Wt 156.0 lb

## 2019-08-26 DIAGNOSIS — I10 Essential (primary) hypertension: Secondary | ICD-10-CM | POA: Diagnosis not present

## 2019-08-26 DIAGNOSIS — R0989 Other specified symptoms and signs involving the circulatory and respiratory systems: Secondary | ICD-10-CM | POA: Diagnosis not present

## 2019-08-26 DIAGNOSIS — R9431 Abnormal electrocardiogram [ECG] [EKG]: Secondary | ICD-10-CM | POA: Diagnosis not present

## 2019-08-26 DIAGNOSIS — I951 Orthostatic hypotension: Secondary | ICD-10-CM

## 2019-08-26 DIAGNOSIS — R011 Cardiac murmur, unspecified: Secondary | ICD-10-CM

## 2019-08-26 DIAGNOSIS — R002 Palpitations: Secondary | ICD-10-CM

## 2019-08-26 DIAGNOSIS — Z87891 Personal history of nicotine dependence: Secondary | ICD-10-CM

## 2019-08-26 NOTE — Progress Notes (Signed)
Date:  08/26/2019   ID:  Murvin Natal, DOB 09/25/1927, MRN [id number]  PCP:  Gayland Curry, DO  Cardiologist:  Rex Kras, DO, [facility] (established care 08/26/2019)  REASON FOR CONSULT: Dizziness and hypotension.  REQUESTING PHYSICIAN:  Gayland Curry, DO [place],  [place] [id number]  Chief Complaint  Patient presents with  . Hypotension    Due to drugs  . Dizziness  . Neck Pain    Left side  . New Patient (Initial Visit)    Referred by Dr. Hollace Kinnier    HPI  Donna [name] is a 86 y.o. female who presents to the office with a chief complaint of " dizziness, palpitations." She is referred to the office at the request of Reed, Tiffany L, DO. Patient's past medical history and cardiovascular risk factors include: hypertension, migraine, GI bleeding, postmenopausal female, advanced age.  Patient is accompanied by her daughter at today's visit.  She is referred to the office at the request of her primary care provider for evaluation and management palpitations.  Patient states that she been experiencing episodes of heart racing that lasted for about 30 minutes in duration and are self-limited.  When she has this recent episodes she feels a discomfort in her throat and left elbow.  At times she feels nauseated.  No improving or worsening factors, no new medications.  No consumption of alcohol, caffeinated beverages, energy drinks, stimulants, no herbal supplements or over-the-counter medications.  She recently lost her husband in June 2021 and since then the episodes have been more frequent.  Dizziness: Patient states that her dizziness are more frequent in the recent past.  Patient was instructed to come off of losartan as her dizziness/hypotension was attributed to drug-induced phenomenon.  Since coming off of losartan she feels better.  However her blood pressure at today's office visit was greater than 180 mmHg.  On recheck her blood pressure by the end of the office visit  had come down to around 150 mmHg.  Patient states that the dizziness is not associated with ambulation, turning her head side to side, no recent middle ear infection, she does have history of vertigo and BPPV.  Symptoms are also brought on by leaning forward.  She denies being nauseated or having focal neurological deficits or facial asymmetry during these episodes.  Patient has a known history of cardiac murmur.  She had an echocardiogram in outside facility more than 5 years ago I do not have the results to review.  No history of rheumatic fever growing up.  Denies prior history of coronary artery disease, myocardial infarction, congestive heart failure, deep venous thrombosis, pulmonary embolism, stroke, transient ischemic attack.  FUNCTIONAL STATUS: No structured exercise program or daily routine. 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    ALLERGIES: No Known Allergies  MEDICATION LIST PRIOR TO VISIT: Current Meds  Medication Sig  . Blood Pressure Monitoring KIT Essential hypertension check bp daily  . Calcium Carbonate-Vitamin D 600-400 MG-UNIT per tablet Take 2 tablets by mouth daily.  . Multiple Vitamins-Minerals (PRESERVISION AREDS 2) CAPS Take 1 capsule by mouth 2 (two) times a day.   Marland Kitchen OVER THE COUNTER MEDICATION MitoQ 2 capsules by mouth daily  . SYNTHROID 100 MCG tablet TAKE 1 TABLET(100 MCG) BY MOUTH DAILY  . vitamin B-12 (CYANOCOBALAMIN) 1000 MCG tablet Take 1 tablet (1,000 mcg total) by mouth daily.     PAST MEDICAL HISTORY: Past Medical History:  Diagnosis Date  . Hemorrhage of gastrointestinal tract, unspecified   . Hypertension   . Migraine   . OA (osteoarthritis)   . Thyroid cyst     PAST SURGICAL HISTORY: Past Surgical History:  Procedure Laterality Date  . CATARACT EXTRACTION  1990   rt  . CATARACT EXTRACTION  2005   left  . REVISION TOTAL KNEE ARTHROPLASTY  2008  . THUMB ARTHROSCOPY    . TONSILLECTOMY      FAMILY  HISTORY: The patient family history includes Arthritis in her sister; Brain cancer in her brother; CVA (age of onset: 5) in her sister; CVA (age of onset: 73) in her father; Heart disease in her sister; Pancreatic cancer in her mother.  SOCIAL HISTORY:  The patient  reports that she quit smoking about 71 years ago. Her smoking use included cigarettes. She quit after 2.00 years of use. She has never used smokeless tobacco. She reports previous alcohol use. She reports that she does not use drugs.  REVIEW OF SYSTEMS: Review of Systems  Constitutional: Negative for chills and fever.  HENT: Negative for hoarse voice and nosebleeds.   Eyes: Negative for discharge, double vision and pain.  Cardiovascular: Negative for chest pain, claudication, dyspnea on exertion, leg swelling, near-syncope, orthopnea, palpitations, paroxysmal nocturnal dyspnea and syncope.  Respiratory: Negative for hemoptysis and shortness of breath.   Musculoskeletal: Negative for muscle cramps and myalgias.  Gastrointestinal: Negative for abdominal pain, constipation, diarrhea, hematemesis, hematochezia, melena, nausea and vomiting.  Neurological: Positive for dizziness, light-headedness and vertigo (last episode was 1.5 year ago ).    PHYSICAL EXAM: Vitals with BMI 08/26/2019 08/20/2019 06/08/2019  Height _0  _1  -  Weight 156 lbs 155 lbs 5 oz 150 lbs  BMI 35.59 74.16 -  Systolic 384 - 536  Diastolic 70 - 60  Pulse 71 - 54   Orthostatic VS for the past 72 hrs (Last 3 readings):  Orthostatic BP Patient Position BP Location Cuff Size Orthostatic Pulse  08/26/19 0900 142/64 Standing Left Arm Normal 79  08/26/19 0859 164/73 Sitting Left Arm Normal 76  08/26/19 0857 173/65 Supine Left Arm Normal 69   CONSTITUTIONAL: Well-developed and well-nourished. No acute distress.  SKIN: Skin is warm and dry. No rash noted. No cyanosis. No pallor. No jaundice HEAD: Normocephalic and atraumatic.  EYES: No scleral  icterus MOUTH/THROAT: Moist oral membranes.  NECK: No JVD present. No thyromegaly noted.  Bilateral carotid bruits most likely secondary to delayed carotid upstrokes LYMPHATIC: No visible cervical adenopathy.  CHEST Normal respiratory effort. No intercostal retractions  LUNGS: Clear to auscultation bilaterally.  No stridor. No wheezes. No rales.  CARDIOVASCULAR: Regular, positive S1 delayed S2, 3 out of 6 crescendo decrescendo murmur heard at the second right intercostal space, with a component of holosystolic murmur at the apex. ABDOMINAL: Soft, nontender, nondistended, no apparent ascites.  EXTREMITIES: Trace bilateral peripheral edema.  HEMATOLOGIC: No significant bruising NEUROLOGIC: Oriented to person, place, and time. Nonfocal. Normal muscle tone.  PSYCHIATRIC: Normal mood and affect. Normal behavior. Cooperative  CARDIAC DATABASE: EKG: 08/26/2019:Sinus  Rhythm, 67bpm, normal axis, PRWP, possible old inferior infarct, nonspecific ST-T changes in high lateral leads (I and avL).  Echocardiogram: NA  Stress Testing: None  Heart Catheterization: None  Carotid duplex: None  LABORATORY DATA: CBC Latest Ref Rng & Units 06/04/2019 04/02/2019 12/08/2018  WBC 3.8 - 10.8 Thousand/uL 4.4 4.8 5.2  Hemoglobin 11.7 - 15.5 g/dL 14.1 14.3 13.7  Hematocrit 35 - 45 % 41.9 42.0 39.7  Platelets 140 - 400 Thousand/uL 175 167 181    CMP Latest Ref Rng & Units 06/04/2019 04/02/2019 12/08/2018  Glucose 65 - 99 mg/dL 99 95 86  BUN 7 - 25 mg/dL _0 Creatinine 0.60 - 0.88 mg/dL 0.89(H) 0.88 0.86  Sodium 135 - 146 mmol/L 137 138 136  Potassium 3.5 - 5.3 mmol/L 5.0 4.5 4.8  Chloride 98 - 110 mmol/L 102 104 102  CO2 20 - 32 mmol/L _1 Calcium 8.6 - 10.4 mg/dL 10.3 10.2 10.0  Total Protein 6.1 - 8.1 g/dL 6.0(L) - 6.0(L)  Total Bilirubin 0.2 - 1.2 mg/dL 0.5 - 0.5  Alkaline Phos 33 - 130 U/L - - -  AST 10 - 35 U/L 14 - 15  ALT 6 - 29 U/L 18 - 17    Lipid Panel     Component Value  Date/Time   CHOL 112 12/08/2018 1004   CHOL 118 08/04/2014 0806   TRIG 80 12/08/2018 1004   HDL 34 (L) 12/08/2018 1004   HDL 34 (L) 08/04/2014 0806   CHOLHDL 3.3 12/08/2018 1004   VLDL 15 08/22/2016 0910   LDLCALC 62 12/08/2018 1004   LABVLDL 15 08/04/2014 0806    No components found for: NTPROBNP No results for input(s): PROBNP in the last 8760 hours. Recent Labs    12/08/18 1004 06/04/19 0810  TSH 4.58* 3.69    BMP Recent Labs    12/08/18 1004 04/02/19 0821 06/04/19 0810  NA 136 138 137  K 4.8 4.5 5.0  CL 102 104 102  CO2 _2 GLUCOSE 86 95 99  BUN _3 CREATININE 0.86 0.88 0.89*  CALCIUM 10.0 10.2 10.3  GFRNONAA 60  --  57*  GFRAA 70  --  67    HEMOGLOBIN A1C Lab Results  Component Value Date   HGBA1C 5.1 08/22/2016   MPG 100 08/22/2016    IMPRESSION:    ICD-10-CM   1. Essential hypertension, benign  I10 EKG 12-Lead  2. Orthostatic hypotension  I95.1   3. Former smoker  Z87.891   4. Cardiac murmur  R01.1 PCV ECHOCARDIOGRAM COMPLETE    PCV CAROTID DUPLEX (BILATERAL)  5. Nonspecific abnormal electrocardiogram (ECG) (EKG)  R94.31 PCV MYOCARDIAL PERFUSION WITH LEXISCAN  6. Palpitations  R00.2 LONG TERM MONITOR (3-14 DAYS)  7. Bilateral carotid bruits  R09.89 PCV CAROTID DUPLEX (BILATERAL)     RECOMMENDATIONS: Donna [name] is a 86 y.o. female whose past medical history and cardiac risk factors include: hypertension, migraine, GI bleeding, postmenopausal female, advanced age.  Orthostatic hypotension:  Patient's orthostatic vital signs are positive at today's office visit.  Patient's systolic blood pressures resting are elevated.  Because systolic blood pressures have been noted to be higher than 180 mmHg I have asked her to restart losartan at 25 mg p.o. daily.  In regards orthostatic hypotension recommended compression stockings.  She was measured at today's office visit.  Recommended introducing small amounts of salt or consuming  Pedialyte and/or Gatorade.  She is encouraged to eat 3 balanced healthy heart meals and consuming appropriate fluid intake.  She is asked to change positions slowly.  Patient has a history of GI bleed but denies evidence of blood loss.  Most recent studies reviewed.  Will reevaluate.  Cardiac murmur:  As per physical examination patient appears to have underlying aortic stenosis murmur.  Plan echocardiogram.  They are encouraged to seek medical attention if they have chest discomfort, syncopal events,  or congestive heart failure symptoms.  No prior history of rheumatic fever.  Abnormal EKG:  Patient has normal sinus rhythm but does have ST-T changes in the high lateral leads concerning for possible underlying ischemia or changes due to underlying LVH.  Given her atypical symptoms of throat pain and left arm pain ischemic evaluation is warranted given her cardiovascular risk factors and advanced age.  Nuclear stress test recommended to evaluate for reversible ischemia.  Palpitations: Will order extended Holter monitor to evaluate for underlying dysrhythmias.  FINAL MEDICATION LIST END OF ENCOUNTER: No orders of the defined types were placed in this encounter.   There are no discontinued medications.   Current Outpatient Medications:  .  Blood Pressure Monitoring KIT, Essential hypertension check bp daily, Disp: 1 kit, Rfl: 0 .  Calcium Carbonate-Vitamin D 600-400 MG-UNIT per tablet, Take 2 tablets by mouth daily., Disp: , Rfl:  .  Multiple Vitamins-Minerals (PRESERVISION AREDS 2) CAPS, Take 1 capsule by mouth 2 (two) times a day. , Disp: , Rfl:  .  OVER THE COUNTER MEDICATION, MitoQ 2 capsules by mouth daily, Disp: , Rfl:  .  SYNTHROID 100 MCG tablet, TAKE 1 TABLET(100 MCG) BY MOUTH DAILY, Disp: 90 tablet, Rfl: 1 .  vitamin B-12 (CYANOCOBALAMIN) 1000 MCG tablet, Take 1 tablet (1,000 mcg total) by mouth daily., Disp: 30 tablet, Rfl: 5  Orders Placed This Encounter  Procedures  .  PCV MYOCARDIAL PERFUSION WITH LEXISCAN  . LONG TERM MONITOR (3-14 DAYS)  . EKG 12-Lead  . PCV ECHOCARDIOGRAM COMPLETE  . PCV CAROTID DUPLEX (BILATERAL)    There are no Patient Instructions on file for this visit.   --Continue cardiac medications as reconciled in final medication list. --Return in about 3 weeks (around 09/16/2019) for re-evaluation of symptoms., review test results.. Or sooner if needed. --Continue follow-up with your primary care physician regarding the management of your other chronic comorbid conditions.  Patient's questions and concerns were addressed to her satisfaction. She voices understanding of the instructions provided during this encounter.   This note was created using a voice recognition software as a result there may be grammatical errors inadvertently enclosed that do not reflect the nature of this encounter. Every attempt is made to correct such errors.  Rex Kras, [place], [facility]  Pager: [phone] Office: [phone]

## 2019-09-02 DIAGNOSIS — H353134 Nonexudative age-related macular degeneration, bilateral, advanced atrophic with subfoveal involvement: Secondary | ICD-10-CM | POA: Diagnosis not present

## 2019-09-02 DIAGNOSIS — H43813 Vitreous degeneration, bilateral: Secondary | ICD-10-CM | POA: Diagnosis not present

## 2019-09-02 DIAGNOSIS — H53413 Scotoma involving central area, bilateral: Secondary | ICD-10-CM | POA: Diagnosis not present

## 2019-09-09 ENCOUNTER — Ambulatory Visit: Payer: Medicare PPO

## 2019-09-09 ENCOUNTER — Encounter: Payer: Self-pay | Admitting: Internal Medicine

## 2019-09-09 ENCOUNTER — Other Ambulatory Visit: Payer: Self-pay

## 2019-09-09 DIAGNOSIS — R011 Cardiac murmur, unspecified: Secondary | ICD-10-CM

## 2019-09-09 DIAGNOSIS — R0989 Other specified symptoms and signs involving the circulatory and respiratory systems: Secondary | ICD-10-CM

## 2019-09-14 ENCOUNTER — Ambulatory Visit: Payer: Medicare PPO

## 2019-09-14 ENCOUNTER — Other Ambulatory Visit: Payer: Self-pay

## 2019-09-14 DIAGNOSIS — R9431 Abnormal electrocardiogram [ECG] [EKG]: Secondary | ICD-10-CM | POA: Diagnosis not present

## 2019-09-15 ENCOUNTER — Encounter: Payer: Self-pay | Admitting: Cardiology

## 2019-09-15 ENCOUNTER — Ambulatory Visit: Payer: Medicare PPO | Admitting: Cardiology

## 2019-09-15 VITALS — BP 164/67 | HR 71 | Resp 16 | Ht 63.0 in | Wt 158.0 lb

## 2019-09-15 DIAGNOSIS — I351 Nonrheumatic aortic (valve) insufficiency: Secondary | ICD-10-CM | POA: Diagnosis not present

## 2019-09-15 DIAGNOSIS — I080 Rheumatic disorders of both mitral and aortic valves: Secondary | ICD-10-CM | POA: Diagnosis not present

## 2019-09-15 DIAGNOSIS — I10 Essential (primary) hypertension: Secondary | ICD-10-CM | POA: Diagnosis not present

## 2019-09-15 DIAGNOSIS — Z87891 Personal history of nicotine dependence: Secondary | ICD-10-CM | POA: Diagnosis not present

## 2019-09-15 DIAGNOSIS — I471 Supraventricular tachycardia: Secondary | ICD-10-CM | POA: Diagnosis not present

## 2019-09-15 DIAGNOSIS — R002 Palpitations: Secondary | ICD-10-CM | POA: Diagnosis not present

## 2019-09-15 DIAGNOSIS — Z712 Person consulting for explanation of examination or test findings: Secondary | ICD-10-CM

## 2019-09-15 MED ORDER — LOSARTAN POTASSIUM 50 MG PO TABS: 50.0000 mg | ORAL_TABLET | Freq: Every evening | ORAL | 0 refills | Status: DC

## 2019-09-15 MED ORDER — DILTIAZEM HCL ER COATED BEADS 120 MG PO CP24: 120.0000 mg | ORAL_CAPSULE | Freq: Every morning | ORAL | 0 refills | Status: DC

## 2019-09-15 NOTE — Progress Notes (Signed)
Date:  09/15/2019  ID:  Donna [name], DOB [birth year], MRN [id number]  PCP:  Gayland Curry, DO  Cardiologist:  Rex Kras, DO, [facility] (established care 08/26/2019)   Chief Complaint  Patient presents with  . Hypertension  . Follow-up  . Results    echo, montior, and LEXISCAN    HPI  Donna [name] is a 86 y.o. female who presents to the office with a chief complaint of " reevaluation of dizziness and review test results." Patient's past medical history and cardiovascular risk factors include: hypertension, supraventricular tachycardia, moderate aortic stenosis, mild LVOT obstruction, severe MR, migraine, GI bleeding, postmenopausal female, advanced age.  Patient is accompanied by her daughter at today's visit.  She is referred to the office at the request of her primary care provider for evaluation and management palpitations.  During the last office visit patient was stating that she would have symptoms of palpitation lasting for 30 minutes in duration which would usually be self-limited.  She underwent a 7-day extended Holter monitor since last office visit which noted the predominant rhythm to be normal sinus, she has an average heart rate of 79 bpm, no atrial fibrillation or pauses noted during the monitoring period, she had 42 episodes of supraventricular tachycardia at times suggestive of possible AVNRT.  She had 6 patient triggered events underlying rhythm was normal sinus with episodes of symptomatic SVT at times as well.  Patient states that since last office visit her symptoms have improved.  In regards to lightheaded and dizziness patient states that the symptoms have improved since last office visit.  She denies any near-syncope or syncopal events.  She underwent an echocardiogram and nuclear stress test as well as a carotid duplex.  Results of which were explained to her in great detail and noted below for further reference.  Of note patient did inform her that her echocardiogram  notes moderate aortic stenosis as well as mild LVOT obstruction with severe MR and severely dilated left atrium.  Her nuclear stress test showed normal perfusion with some EKG changes which resolved 2 minutes into recovery and mildly reduced left ventricular systolic function per gated SPECT.  Carotid duplex noted no significant carotid stenosis.  Denies prior history of coronary artery disease, myocardial infarction, congestive heart failure, deep venous thrombosis, pulmonary embolism, stroke, transient ischemic attack.  FUNCTIONAL STATUS: No structured exercise program or daily routine. 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    ALLERGIES: No Known Allergies  MEDICATION LIST PRIOR TO VISIT: Current Meds  Medication Sig  . Blood Pressure Monitoring KIT Essential hypertension check bp daily  . Calcium Carbonate-Vitamin D 600-400 MG-UNIT per tablet Take 2 tablets by mouth daily.  . Coenzyme Q10 (COQ10) 400 MG CAPS Take 400 mg by mouth daily.   . Multiple Vitamins-Minerals (PRESERVISION AREDS 2) CAPS Take 1 capsule by mouth 2 (two) times a day.   Marland Kitchen OVER THE COUNTER MEDICATION Take 2 capsules by mouth daily. MitoQ  . SYNTHROID 100 MCG tablet TAKE 1 TABLET(100 MCG) BY MOUTH DAILY (Patient taking differently: Take 100 mcg by mouth daily before breakfast. )  . vitamin B-12 (CYANOCOBALAMIN) 1000 MCG tablet Take 1 tablet (1,000 mcg total) by mouth daily.  . [DISCONTINUED] losartan (COZAAR) 50 MG tablet Take 25 mg by mouth daily.     PAST MEDICAL HISTORY: Past Medical History:  Diagnosis Date  . Hemorrhage of gastrointestinal tract, unspecified   . Hypertension   . Migraine   .  OA (osteoarthritis)   . Thyroid cyst     PAST SURGICAL HISTORY: Past Surgical History:  Procedure Laterality Date  . CATARACT EXTRACTION  1990   rt  . CATARACT EXTRACTION  2005   left  . REVISION TOTAL KNEE ARTHROPLASTY  2008  . THUMB ARTHROSCOPY    . TONSILLECTOMY      FAMILY  HISTORY: The patient family history includes Arthritis in her sister; Brain cancer in her brother; CVA (age of onset: 11) in her sister; CVA (age of onset: 40) in her father; Heart disease in her sister; Pancreatic cancer in her mother.  SOCIAL HISTORY:  The patient  reports that she quit smoking about 71 years ago. Her smoking use included cigarettes. She quit after 2.00 years of use. She has never used smokeless tobacco. She reports previous alcohol use. She reports that she does not use drugs.  REVIEW OF SYSTEMS: Review of Systems  Constitutional: Negative for chills and fever.  HENT: Negative for hoarse voice and nosebleeds.   Eyes: Negative for discharge, double vision and pain.  Cardiovascular: Negative for chest pain, claudication, dyspnea on exertion, leg swelling, near-syncope, orthopnea, palpitations, paroxysmal nocturnal dyspnea and syncope.  Respiratory: Positive for shortness of breath. Negative for hemoptysis.   Musculoskeletal: Negative for muscle cramps and myalgias.  Gastrointestinal: Negative for abdominal pain, constipation, diarrhea, hematemesis, hematochezia, melena, nausea and vomiting.  Neurological: Positive for vertigo (last episode was 1.5 year ago ). Negative for dizziness and light-headedness.    PHYSICAL EXAM: Vitals with BMI 10/01/2019 10/01/2019 09/15/2019  Height _0  _1  _2   Weight 157 lbs 156 lbs 6 oz 158 lbs  BMI 52.77 82.42 28  Systolic 353 614 431  Diastolic 63 64 67  Pulse 68 65 71   CONSTITUTIONAL: Well-developed and well-nourished. No acute distress.  SKIN: Skin is warm and dry. No rash noted. No cyanosis. No pallor. No jaundice HEAD: Normocephalic and atraumatic.  EYES: No scleral icterus MOUTH/THROAT: Moist oral membranes.  NECK: No JVD present. No thyromegaly noted.  Bilateral carotid bruits most likely secondary to delayed carotid upstrokes LYMPHATIC: No visible cervical adenopathy.  CHEST Normal respiratory effort. No intercostal  retractions  LUNGS: Clear to auscultation bilaterally.  No stridor. No wheezes. No rales.  CARDIOVASCULAR: Regular, positive S1 delayed S2, 3 out of 6 crescendo decrescendo murmur heard at the second right intercostal space, with a component of holosystolic murmur at the apex. ABDOMINAL: Soft, nontender, nondistended, no apparent ascites.  EXTREMITIES: Trace bilateral peripheral edema.  HEMATOLOGIC: No significant bruising NEUROLOGIC: Oriented to person, place, and time. Nonfocal. Normal muscle tone.  PSYCHIATRIC: Normal mood and affect. Normal behavior. Cooperative  CARDIAC DATABASE: EKG: 08/26/2019:Sinus  Rhythm, 67bpm, normal axis, PRWP, possible old inferior infarct, nonspecific ST-T changes in high lateral leads (I and avL).  Echocardiogram: 09/09/2019:  Left ventricle cavity is normal in size. Severe concentric hypertrophy of the left ventricle. Normal global wall motion. Normal LV systolic function with visual EF 50-55%. Doppler evidence of grade I (impaired) diastolic dysfunction, normal LAP.  Left atrial cavity is severely dilated.  Unable to clearly determine number of leaflets due to off axis view.  Moderate calcification. Mild aortic stenosis, in addition to mild LVOT obstruction. Aortic valve mean gradient of 18 mmHg, Vmax of 3.0 m/s. Calculated aortic valve area by continuity equation is 2 cm. Mild (grade I) aortic regurgitation. Moderate aortic stenosis. Moderate (Grade II) aortic regurgitation.  Severe (Grade III) mitral regurgitation.  Moderate tricuspid regurgitation. Estimated pulmonary artery systolic pressure  28 mmHg.   Stress Testing: Lexiscan/modified Bruce Tetrofosmin stress test 09/14/2019: Lexiscan/modified Bruce nuclear stress test performed using 1-day protocol. Stress EKG is non-diagnostic, as this is pharmacological stress test. In addition, stress EKG at 84% MPHR showed sinus tachycardia, T wave inversion/ 1.5-2 mm downsloping ST depression in leads II, III,  aVF, V6-partially normalize two min into recovery.  Stress LVEF low normal at 49%, TID of 1.13. In absence of regional myocardial perfusion defects, these findings are nonspecific. Recommend clinical correlation.   Heart Catheterization: None  Carotid artery duplex 09/09/2019:  Minimal stenosis in the right internal carotid artery (minimal).  Minimal stenosis in the left internal carotid artery (minimal).  Minimal plaque noted in the CCA bilaterally without significant stenosis.  Antegrade right vertebral artery flow. Antegrade left vertebral artery flow.  Follow up studies if clinically indicated.  LABORATORY DATA: CBC Latest Ref Rng & Units 06/04/2019 04/02/2019 12/08/2018  WBC 3.8 - 10.8 Thousand/uL 4.4 4.8 5.2  Hemoglobin 11.7 - 15.5 g/dL 14.1 14.3 13.7  Hematocrit 35 - 45 % 41.9 42.0 39.7  Platelets 140 - 400 Thousand/uL 175 167 181    CMP Latest Ref Rng & Units 06/04/2019 04/02/2019 12/08/2018  Glucose 65 - 99 mg/dL 99 95 86  BUN 7 - 25 mg/dL _0 Creatinine 0.60 - 0.88 mg/dL 0.89(H) 0.88 0.86  Sodium 135 - 146 mmol/L 137 138 136  Potassium 3.5 - 5.3 mmol/L 5.0 4.5 4.8  Chloride 98 - 110 mmol/L 102 104 102  CO2 20 - 32 mmol/L _1 Calcium 8.6 - 10.4 mg/dL 10.3 10.2 10.0  Total Protein 6.1 - 8.1 g/dL 6.0(L) - 6.0(L)  Total Bilirubin 0.2 - 1.2 mg/dL 0.5 - 0.5  Alkaline Phos 33 - 130 U/L - - -  AST 10 - 35 U/L 14 - 15  ALT 6 - 29 U/L 18 - 17    Lipid Panel     Component Value Date/Time   CHOL 112 12/08/2018 1004   CHOL 118 08/04/2014 0806   TRIG 80 12/08/2018 1004   HDL 34 (L) 12/08/2018 1004   HDL 34 (L) 08/04/2014 0806   CHOLHDL 3.3 12/08/2018 1004   VLDL 15 08/22/2016 0910   LDLCALC 62 12/08/2018 1004   LABVLDL 15 08/04/2014 0806    No components found for: NTPROBNP No results for input(s): PROBNP in the last 8760 hours. Recent Labs    12/08/18 1004 06/04/19 0810  TSH 4.58* 3.69    BMP Recent Labs    12/08/18 1004 04/02/19 0821  06/04/19 0810  NA 136 138 137  K 4.8 4.5 5.0  CL 102 104 102  CO2 _2 GLUCOSE 86 95 99  BUN _3 CREATININE 0.86 0.88 0.89*  CALCIUM 10.0 10.2 10.3  GFRNONAA 60  --  57*  GFRAA 70  --  67    HEMOGLOBIN A1C Lab Results  Component Value Date   HGBA1C 5.1 08/22/2016   MPG 100 08/22/2016    IMPRESSION:    ICD-10-CM   1. Essential hypertension, benign  I10 DISCONTINUED: losartan (COZAAR) 50 MG tablet  2. Nonrheumatic aortic valve insufficiency  I35.1 ECHOCARDIOGRAM COMPLETE  3. Mitral regurgitation and aortic stenosis  I08.0 diltiazem (CARDIZEM CD) 120 MG 24 hr capsule    ECHOCARDIOGRAM COMPLETE    DISCONTINUED: losartan (COZAAR) 50 MG tablet  4. Paroxysmal SVT (supraventricular tachycardia) (HCC)  I47.1 diltiazem (CARDIZEM CD) 120 MG 24 hr capsule  5. Former smoker  [phone]  6. Encounter to discuss test results  Z71.2      RECOMMENDATIONS: Donna [name] is a 86 y.o. female whose past medical history and cardiac risk factors include: hypertension, supraventricular tachycardia, moderate aortic stenosis, mild LVOT obstruction, severe MR, migraine, GI bleeding, postmenopausal female, advanced age.  Orthostatic hypotension: Improving.   Increase losartan to 63m po q evening.   Still would benefit from compression stockings, slowly standing up, eat 3 balanced healthy heart meals and consuming appropriate fluid intake.  Patient has a history of GI bleed but denies evidence of blood loss.  Aortic stenosis,  Patient's most recent echocardiogram noting moderate aortic stenosis. She denies any 3 cardinal symptoms of a aspirin including syncope, angina, congestive heart failure. She is asked to look out for the signs and symptoms and to seek medical attention sooner if they surface.  We will order another echocardiogram in February 2022 to reevaluate the severity of the events at [place]o monitor.  Severe MR:  Educated on importance of  better blood pressure management.  Continue to monitor.  Repeat echocardiogram in February 2022 for 652-monthollow-up to reevaluate disease progression in LVEF  Supraventricular tachycardia:  During the time of the patient more than 7-day extended Holter monitor she had symptomatic SVT. Some SVT episodes are suggestive of AVNRT physiology.  Patient is not on any AV nodal blocking agents.  We will start diltiazem 120 mg p.o. every morning.  Monitor for symptoms.  May benefit from EP evaluation if she continues to have signs and symptoms despite up titration of medical therapy.  Will re-evaluate.   FINAL MEDICATION LIST END OF ENCOUNTER:  Current Outpatient Medications:  .  Blood Pressure Monitoring KIT, Essential hypertension check bp daily, Disp: 1 kit, Rfl: 0 .  Calcium Carbonate-Vitamin D 600-400 MG-UNIT per tablet, Take 2 tablets by mouth daily., Disp: , Rfl:  .  Coenzyme Q10 (COQ10) 400 MG CAPS, Take 400 mg by mouth daily. , Disp: , Rfl:  .  Multiple Vitamins-Minerals (PRESERVISION AREDS 2) CAPS, Take 1 capsule by mouth 2 (two) times a day. , Disp: , Rfl:  .  OVER THE COUNTER MEDICATION, Take 2 capsules by mouth daily. MitoQ, Disp: , Rfl:  .  SYNTHROID 100 MCG tablet, TAKE 1 TABLET(100 MCG) BY MOUTH DAILY (Patient taking differently: Take 100 mcg by mouth daily before breakfast. ), Disp: 90 tablet, Rfl: 1 .  vitamin B-12 (CYANOCOBALAMIN) 1000 MCG tablet, Take 1 tablet (1,000 mcg total) by mouth daily., Disp: 30 tablet, Rfl: 5 .  diltiazem (CARDIZEM CD) 120 MG 24 hr capsule, Take 1 capsule (120 mg total) by mouth in the morning., Disp: 90 capsule, Rfl: 0 .  losartan (COZAAR) 50 MG tablet, Take 1 tablet (50 mg total) by mouth in the morning and at bedtime. (Patient taking differently: Take 50 mg by mouth daily. ), Disp: 180 tablet, Rfl: 0 .  traMADol (ULTRAM) 50 MG tablet, Take 0.5 tablets (25 mg total) by mouth daily as needed for severe pain., Disp: 15 tablet, Rfl: 0  Orders  Placed This Encounter  Procedures  . ECHOCARDIOGRAM COMPLETE    There are no Patient Instructions on file for this visit.   --Continue cardiac medications as reconciled in final medication list. --Return in about 4 weeks (around 10/13/2019) for follow up for AS, MR . Or sooner if needed. --Continue follow-up with your primary care physician regarding the management of your other chronic comorbid conditions.  Patient's questions and concerns were addressed  to her satisfaction. She voices understanding of the instructions provided during this encounter.   This note was created using a voice recognition software as a result there may be grammatical errors inadvertently enclosed that do not reflect the nature of this encounter. Every attempt is made to correct such errors.  Rex Kras, [place], [facility]  Pager: [phone] Office: [phone]

## 2019-09-16 DIAGNOSIS — H02421 Myogenic ptosis of right eyelid: Secondary | ICD-10-CM | POA: Diagnosis not present

## 2019-09-16 DIAGNOSIS — Z09 Encounter for follow-up examination after completed treatment for conditions other than malignant neoplasm: Secondary | ICD-10-CM | POA: Diagnosis not present

## 2019-09-16 DIAGNOSIS — G514 Facial myokymia: Secondary | ICD-10-CM | POA: Diagnosis not present

## 2019-09-16 DIAGNOSIS — D23112 Other benign neoplasm of skin of right lower eyelid, including canthus: Secondary | ICD-10-CM | POA: Diagnosis not present

## 2019-09-17 ENCOUNTER — Ambulatory Visit: Payer: Medicare PPO | Admitting: Cardiology

## 2019-09-18 ENCOUNTER — Ambulatory Visit: Payer: Medicare PPO | Admitting: Cardiology

## 2019-09-22 ENCOUNTER — Other Ambulatory Visit: Payer: Self-pay

## 2019-09-22 ENCOUNTER — Telehealth: Payer: Self-pay

## 2019-09-22 ENCOUNTER — Encounter: Payer: Self-pay | Admitting: Family

## 2019-09-22 ENCOUNTER — Ambulatory Visit (INDEPENDENT_AMBULATORY_CARE_PROVIDER_SITE_OTHER): Payer: Medicare PPO | Admitting: Family

## 2019-09-22 DIAGNOSIS — R002 Palpitations: Secondary | ICD-10-CM | POA: Diagnosis not present

## 2019-09-22 DIAGNOSIS — Z0000 Encounter for general adult medical examination without abnormal findings: Secondary | ICD-10-CM

## 2019-09-22 NOTE — Telephone Encounter (Signed)
[name], stanczyk are scheduled for a virtual visit with your provider today.    Just as we do with appointments in the office, we must obtain your consent to participate.  Your consent will be active for this visit and any virtual visit you may have with one of our providers in the next 365 days.    If you have a MyChart account, I can also send a copy of this consent to you electronically.  All virtual visits are billed to your insurance company just like a traditional visit in the office.  As this is a virtual visit, video technology does not allow for your provider to perform a traditional examination.  This may limit your provider's ability to fully assess your condition.  If your provider identifies any concerns that need to be evaluated in person or the need to arrange testing such as labs, EKG, etc, we will make arrangements to do so.    Although advances in technology are sophisticated, we cannot ensure that it will always work on either your end or our end.  If the connection with a video visit is poor, we may have to switch to a telephone visit.  With either a video or telephone visit, we are not always able to ensure that we have a secure connection.   I need to obtain your verbal consent now.   Are you willing to proceed with your visit today?   Donna [name] has provided verbal consent on 09/22/2019 for a virtual visit (video or telephone).   Dicky Doe, CMA 09/22/2019  9:02 AM

## 2019-09-22 NOTE — Patient Instructions (Signed)
Donna [name] , Thank you for taking time to come for your Medicare Wellness Visit. I appreciate your ongoing commitment to your health goals. Please review the following plan we discussed and let me know if I can assist you in the future.   Screening recommendations/referrals: Colonoscopy: N/A  Mammogram: N/A  Bone Density: Declined notify Dr.Reed if desired then will order. Recommended yearly ophthalmology/optometry visit for glaucoma screening and checkup Recommended yearly dental visit for hygiene and checkup  Vaccinations: Influenza vaccine: Annual  Pneumococcal vaccine : Up to date  Tdap vaccine : Up to date  Shingles vaccine: Up to date   Advanced directives: Yes  Conditions/risks identified: Advance Age female > 45 yrs,Hypertension,dyslipidemia,Hx of smoking ,sedentary   Next appointment: 1 year   Preventive Care 29 Years and Older, Female Preventive care refers to lifestyle choices and visits with your health care provider that can promote health and wellness. What does preventive care include?  A yearly physical exam. This is also called an annual well check.  Dental exams once or twice a year.  Routine eye exams. Ask your health care provider how often you should have your eyes checked.  Personal lifestyle choices, including:  Daily care of your teeth and gums.  Regular physical activity.  Eating a healthy diet.  Avoiding tobacco and drug use.  Limiting alcohol use.  Practicing safe sex.  Taking low-dose aspirin every day.  Taking vitamin and mineral supplements as recommended by your health care provider. What happens during an annual well check? The services and screenings done by your health care provider during your annual well check will depend on your age, overall health, lifestyle risk factors, and family history of disease. Counseling  Your health care provider may ask you questions about your:  Alcohol use.  Tobacco use.  Drug use.  Emotional  well-being.  Home and relationship well-being.  Sexual activity.  Eating habits.  History of falls.  Memory and ability to understand (cognition).  Work and work Astronomer.  Reproductive health. Screening  You may have the following tests or measurements:  Height, weight, and BMI.  Blood pressure.  Lipid and cholesterol levels. These may be checked every 5 years, or more frequently if you are over 71 years old.  Skin check.  Lung cancer screening. You may have this screening every year starting at age 31 if you have a 30-pack-year history of smoking and currently smoke or have quit within the past 15 years.  Fecal occult blood test (FOBT) of the stool. You may have this test every year starting at age 52.  Flexible sigmoidoscopy or colonoscopy. You may have a sigmoidoscopy every 5 years or a colonoscopy every 10 years starting at age 37.  Hepatitis C blood test.  Hepatitis B blood test.  Sexually transmitted disease (STD) testing.  Diabetes screening. This is done by checking your blood sugar (glucose) after you have not eaten for a while (fasting). You may have this done every 1-3 years.  Bone density scan. This is done to screen for osteoporosis. You may have this done starting at age 14.  Mammogram. This may be done every 1-2 years. Talk to your health care provider about how often you should have regular mammograms. Talk with your health care provider about your test results, treatment options, and if necessary, the need for more tests. Vaccines  Your health care provider may recommend certain vaccines, such as:  Influenza vaccine. This is recommended every year.  Tetanus, diphtheria, and acellular pertussis (  Tdap, Td) vaccine. You may need a Td booster every 10 years.  Zoster vaccine. You may need this after age 13.  Pneumococcal 13-valent conjugate (PCV13) vaccine. One dose is recommended after age 62.  Pneumococcal polysaccharide (PPSV23) vaccine. One  dose is recommended after age 76. Talk to your health care provider about which screenings and vaccines you need and how often you need them. This information is not intended to replace advice given to you by your health care provider. Make sure you discuss any questions you have with your health care provider. Document Released: 01/28/2015 Document Revised: 09/21/2015 Document Reviewed: 11/02/2014 Elsevier Interactive Patient Education  2017 [place] Prevention in the Home Falls can cause injuries. They can happen to people of all ages. There are many things you can do to make your home safe and to help prevent falls. What can I do on the outside of my home?  Regularly fix the edges of walkways and driveways and fix any cracks.  Remove anything that might make you trip as you walk through a door, such as a raised step or threshold.  Trim any bushes or trees on the path to your home.  Use bright outdoor lighting.  Clear any walking paths of anything that might make someone trip, such as rocks or tools.  Regularly check to see if handrails are loose or broken. Make sure that both sides of any steps have handrails.  Any raised decks and porches should have guardrails on the edges.  Have any leaves, snow, or ice cleared regularly.  Use sand or salt on walking paths during winter.  Clean up any spills in your garage right away. This includes oil or grease spills. What can I do in the bathroom?  Use night lights.  Install grab bars by the toilet and in the tub and shower. Do not use towel bars as grab bars.  Use non-skid mats or decals in the tub or shower.  If you need to sit down in the shower, use a plastic, non-slip stool.  Keep the floor dry. Clean up any water that spills on the floor as soon as it happens.  Remove soap buildup in the tub or shower regularly.  Attach bath mats securely with double-sided non-slip rug tape.  Do not have throw rugs and other  things on the floor that can make you trip. What can I do in the bedroom?  Use night lights.  Make sure that you have a light by your bed that is easy to reach.  Do not use any sheets or blankets that are too big for your bed. They should not hang down onto the floor.  Have a firm chair that has side arms. You can use this for support while you get dressed.  Do not have throw rugs and other things on the floor that can make you trip. What can I do in the kitchen?  Clean up any spills right away.  Avoid walking on wet floors.  Keep items that you use a lot in easy-to-reach places.  If you need to reach something above you, use a strong step stool that has a grab bar.  Keep electrical cords out of the way.  Do not use floor polish or wax that makes floors slippery. If you must use wax, use non-skid floor wax.  Do not have throw rugs and other things on the floor that can make you trip. What can I do with my stairs?  Do  not leave any items on the stairs.  Make sure that there are handrails on both sides of the stairs and use them. Fix handrails that are broken or loose. Make sure that handrails are as long as the stairways.  Check any carpeting to make sure that it is firmly attached to the stairs. Fix any carpet that is loose or worn.  Avoid having throw rugs at the top or bottom of the stairs. If you do have throw rugs, attach them to the floor with carpet tape.  Make sure that you have a light switch at the top of the stairs and the bottom of the stairs. If you do not have them, ask someone to add them for you. What else can I do to help prevent falls?  Wear shoes that:  Do not have high heels.  Have rubber bottoms.  Are comfortable and fit you well.  Are closed at the toe. Do not wear sandals.  If you use a stepladder:  Make sure that it is fully opened. Do not climb a closed stepladder.  Make sure that both sides of the stepladder are locked into place.  Ask  someone to hold it for you, if possible.  Clearly mark and make sure that you can see:  Any grab bars or handrails.  First and last steps.  Where the edge of each step is.  Use tools that help you move around (mobility aids) if they are needed. These include:  Canes.  Walkers.  Scooters.  Crutches.  Turn on the lights when you go into a dark area. Replace any light bulbs as soon as they burn out.  Set up your furniture so you have a clear path. Avoid moving your furniture around.  If any of your floors are uneven, fix them.  If there are any pets around you, be aware of where they are.  Review your medicines with your doctor. Some medicines can make you feel dizzy. This can increase your chance of falling. Ask your doctor what other things that you can do to help prevent falls. This information is not intended to replace advice given to you by your health care provider. Make sure you discuss any questions you have with your health care provider. Document Released: 10/28/2008 Document Revised: 06/09/2015 Document Reviewed: 02/05/2014 Elsevier Interactive Patient Education  2017 [employer].

## 2019-09-22 NOTE — Progress Notes (Signed)
This service is provided via telemedicine  No vital signs collected/recorded due to the encounter was a telemedicine visit.   Location of patient (ex: home, work): Home  Patient consents to a telephone visit:  Yes.  Location of the provider (ex: office, home):  [facility].  Name of any referring provider:  N/A  Names of all persons participating in the telemedicine service and their role in the encounter: Patient, Heriberto Antigua, [place], [place], Webb Silversmith, NP.    Time spent on call: 8 minutes spent on the phone with Medical Assistant.      Subjective:   Donna [name] is a 86 y.o. female who presents for Medicare Annual (Subsequent) preventive examination.  Review of Systems     Cardiac Risk Factors include: hypertension;dyslipidemia;smoking/ tobacco exposure;sedentary lifestyle;advanced age (>47mn, >>17women)     Objective:    Today's Vitals   09/22/19 0927  PainSc: 4    There is no height or weight on file to calculate BMI.  Advanced Directives 09/22/2019 08/20/2019 06/08/2019 09/16/2018 06/02/2018 03/20/2018 10/03/2017  Does Patient Have a Medical Advance Directive? _0  Yes Yes  Type of AParamedicof ALarwillOut of facility DNR (pink MOST or yellow form) Out of facility DNR (pink MOST or yellow form) Healthcare Power of [place] will;Out of facility DNR (pink MOST or yellow form) [place] of facility DNR (pink MOST or yellow form) Healthcare Power of [place] Does patient want to make changes to medical advance directive? No - Patient declined No - Patient declined No - Patient declined No - Patient declined No - Patient declined No - Patient declined No - Patient declined  Copy of [place] Chart? Yes - validated most recent copy scanned in chart (See row information) - - Yes - validated most recent copy scanned in chart (See  row information) Yes - validated most recent copy scanned in chart (See row information) Yes - validated most recent copy scanned in chart (See row information) Yes  Pre-existing out of facility DNR order (yellow form or pink MOST form) Pink MOST/Yellow Form most recent copy in chart - Physician notified to receive inpatient order Pink MOST/Yellow Form most recent copy in chart - Physician notified to receive inpatient order Pink MOST/Yellow Form most recent copy in chart - Physician notified to receive inpatient order - Yellow form placed in chart (order not valid for inpatient use) Yellow form placed in chart (order not valid for inpatient use) -    Current Medications (verified) Outpatient Encounter Medications as of 09/22/2019  Medication Sig   Blood Pressure Monitoring KIT Essential hypertension check bp daily   Calcium Carbonate-Vitamin D 600-400 MG-UNIT per tablet Take 2 tablets by mouth daily.   Coenzyme Q10 (COQ10) 400 MG CAPS Take 1 tablet by mouth daily.   diltiazem (CARDIZEM CD) 120 MG 24 hr capsule Take 1 capsule (120 mg total) by mouth in the morning.   losartan (COZAAR) 50 MG tablet Take 1 tablet (50 mg total) by mouth at bedtime.   Multiple Vitamins-Minerals (PRESERVISION AREDS 2) CAPS Take 1 capsule by mouth 2 (two) times a day.    OVER THE COUNTER MEDICATION MitoQ 2 capsules by mouth daily   SYNTHROID 100 MCG tablet TAKE 1 TABLET(100 MCG) BY MOUTH DAILY   vitamin B-12 (CYANOCOBALAMIN) 1000 MCG tablet Take 1 tablet (1,000 mcg total) by mouth daily.   No  facility-administered encounter medications on file as of 09/22/2019.    Allergies (verified) Patient has no known allergies.   History: Past Medical History:  Diagnosis Date   Hemorrhage of gastrointestinal tract, unspecified    Hypertension    Migraine    OA (osteoarthritis)    Thyroid cyst    Past Surgical History:  Procedure Laterality Date   CATARACT EXTRACTION  1990   rt   CATARACT EXTRACTION   2005   left   REVISION TOTAL KNEE ARTHROPLASTY  2008   THUMB ARTHROSCOPY     TONSILLECTOMY     Family History  Problem Relation Age of Onset   CVA Father 15   Pancreatic cancer Mother    CVA Sister 49   Brain cancer Brother    Arthritis Sister    Heart disease Sister    Breast cancer Neg Hx    Social History   Socioeconomic History   Marital status: Widowed    Spouse name: Not on file   Number of children: 2   Years of education: Not on file   Highest education level: Not on file  Occupational History   Not on file  Tobacco Use   Smoking status: Former Smoker    Years: 2.00    Types: Cigarettes    Quit date: 01/16/1948    Years since quitting: 71.7   Smokeless tobacco: Never Used   Tobacco comment: smoked on occ when she was a teen  Scientific laboratory technician Use: Never used  Substance and Sexual Activity   Alcohol use: Not Currently    Comment: rarely has a glass of wine   Drug use: No   Sexual activity: Not on file  Other Topics Concern   Not on file  Social History Narrative   Not on file   Social Determinants of Health   Financial Resource Strain:    Difficulty of Paying Living Expenses: Not on file  Food Insecurity:    Worried About [place] in the Last Year: Not on file   [employer] of Food in the Last Year: Not on file  Transportation Needs:    Lack of Transportation (Medical): Not on file   Lack of Transportation (Non-Medical): Not on file  Physical Activity:    Days of Exercise per Week: Not on file   Minutes of Exercise per Session: Not on file  Stress:    Feeling of Stress : Not on file  Social Connections:    Frequency of Communication with Friends and Family: Not on file   Frequency of Social Gatherings with Friends and Family: Not on file   Attends Religious Services: Not on file   Active Member of Clubs or Organizations: Not on file   Attends Archivist Meetings: Not on file   Marital  Status: Not on file    Tobacco Counseling Counseling given: Not Answered Comment: smoked on occ when she was a teen   Clinical Intake:  Pre-visit preparation completed: No  Pain : 0-10 Pain Score: 4  Pain Type: Chronic pain Pain Location: Back Pain Orientation: Lower Pain Radiating Towards: No Pain Descriptors / Indicators: Dull Pain Onset: Other (comment) (several years) Pain Frequency: Constant Pain Relieving Factors: Tylenol,Heating pad Effect of Pain on Daily Activities: standing and walking  Pain Relieving Factors: Tylenol,Heating pad  BMI - recorded: 27.51 Nutritional Status: BMI 25 -29 Overweight Nutritional Risks: None Diabetes: No  How often do you need to have someone help you when  you read instructions, pamphlets, or other written materials from your doctor or pharmacy?: 1 - Never What is the last grade level you completed in school?: 4 college  Diabetic?No  Information entered by :: Kallan Bischoff FNP-C   Activities of Daily Living In your present state of health, do you have any difficulty performing the following activities: 09/22/2019  Hearing? N  Vision? Y  Comment macular degeneration  Difficulty concentrating or making decisions? Y  Comment concentrating  Walking or climbing stairs? Y  Comment slow  Dressing or bathing? N  Doing errands, shopping? N  Preparing Food and eating ? N  Using the Toilet? N  In the past six months, have you accidently leaked urine? N  Do you have problems with loss of bowel control? N  Managing your Medications? N  Managing your Finances? N  Housekeeping or managing your Housekeeping? Y  Comment has Housekeeper every other week  Some recent data might be hidden    Patient Care Team: Gayland Curry, DO as PCP - General (Geriatric Medicine) [name], OD as Consulting Physician (Optometry) Sallyanne Havers Charlotta Newton., MD as Referring Physician (Orthopedic Surgery)  Indicate any recent Medical Services you may have  received from other than [facility] providers in the past year (date may be approximate).     Assessment:   This is a routine wellness examination for Don.  Hearing/Vision screen  Hearing Screening   _0  _1  _2  _3  _4  _5  _6  _7  _8   Right ear:           Left ear:           Comments: Some Hearing Concerns.   Vision Screening Comments: No Vision Concerns. Patient wears prescription glasses.   Dietary issues and exercise activities discussed: Current Exercise Habits: The patient does not participate in regular exercise at present, Exercise limited by: Other - see comments (lower back pain)  Goals     Maintain self care     Patient will maintain self care      Depression Screen PHQ 2/9 Scores 09/22/2019 08/20/2019 06/08/2019 12/08/2018 09/16/2018 06/02/2018 10/03/2017  PHQ - 2 Score 0 0 0 0 0 0 0    Fall Risk Fall Risk  09/22/2019 08/20/2019 06/08/2019 12/08/2018 09/16/2018  Falls in the past year? 0 0 1 0 0  Number falls in past yr: 0 0 0 0 0  Injury with Fall? 0 0 0 0 0    Any stairs in or around the home? Yes  If so, are there any without handrails? Yes  Home free of loose throw rugs in walkways, pet beds, electrical cords, etc? yes pets-cats Adequate lighting in your home to reduce risk of falls? Yes   ASSISTIVE DEVICES UTILIZED TO PREVENT FALLS:  Life alert? Yes  Use of a cane, walker or w/c? No  Grab bars in the bathroom? Yes  Shower chair or bench in shower? Yes  Elevated toilet seat or a handicapped toilet? Yes   TIMED UP AND GO:  Was the test performed? No .  Length of time to ambulate 10 feet: N/A  sec.   Gait slow and steady without use of assistive device  Cognitive Function: MMSE - Mini Mental State Exam 09/16/2018 09/11/2017 08/22/2016 08/19/2015 08/13/2014  Orientation to time _9 Orientation to Place _10 Registration _11 Attention/ Calculation _12 Recall _13 Language- name  2 objects _0 Language-  repeat _1 Language- follow 3 step command _2 Language- read & follow direction _3 Write a sentence _4 Copy design _5 Total score _6 6CIT Screen 09/22/2019  What Year? 0 points  What month? 0 points  What time? 0 points  Count back from 20 0 points  Months in reverse 0 points  Repeat phrase 4 points  Total Score 4    Immunizations Immunization History  Administered Date(s) Administered   Fluad Quad(high Dose 65+) 10/08/2018   Influenza Whole 10/16/2011   Influenza, High Dose Seasonal PF 10/01/2017   Influenza,inj,Quad PF,6+ Mos 09/21/2015   Influenza-Unspecified 10/22/2013, 10/01/2014, 09/26/2016   PFIZER SARS-COV-2 Vaccination 02/22/2019, 03/19/2019   Pneumococcal Conjugate-13 08/13/2014   Pneumococcal Polysaccharide-23 08/19/2015   Tdap 08/27/2013   Zoster 01/16/2011   Zoster Recombinat (Shingrix) 05/29/2016, 08/23/2016    TDAP status: Up to date Flu Vaccine status: Up to date Pneumococcal vaccine status: Up to date Covid-19 vaccine status: Completed vaccines  Qualifies for Shingles Vaccine? Yes   Zostavax completed Yes   Shingrix Completed?: Yes  Screening Tests Health Maintenance  Topic Date Due   DEXA SCAN  Never done   INFLUENZA VACCINE  08/16/2019   TETANUS/TDAP  08/28/2023   COVID-19 Vaccine  Completed   PNA vac Low Risk Adult  Completed    Health Maintenance  Health Maintenance Due  Topic Date Due   DEXA SCAN  Never done   INFLUENZA VACCINE  08/16/2019    Colorectal cancer screening: No longer required.  Mammogram status: No longer required.  Bone Density status: Ordered 09/22/2019 declined . Pt provided with contact info and advised to call to schedule appt.  Lung Cancer Screening: (Low Dose CT Chest recommended if Age 60-80 years, 30 pack-year currently smoking OR have quit w/in 15years.) does not qualify.   Lung Cancer Screening Referral: No   Additional  Screening:  Hepatitis C Screening: does not qualify; Completed Yes   Vision Screening: Recommended annual ophthalmology exams for early detection of glaucoma and other disorders of the eye. Is the patient up to date with their annual eye exam?  Yes  Who is the provider or what is the name of the office in which the patient attends annual eye exams? Dr.Patel and Dr.Sanders [facility]  If pt is not established with a provider, would they like to be referred to a provider to establish care? No .   Dental Screening: Recommended annual dental exams for proper oral hygiene  Community Resource Referral / Chronic Care Management: CRR required this visit?  No   CCM required this visit?  No     Plan:    - Declined Dexa Scan   - Influenza vaccine due but states gets towards end of September  I have personally reviewed and noted the following in the patients chart:    Medical and social history  Use of alcohol, tobacco or illicit drugs   Current medications and supplements  Functional ability and status  Nutritional status  Physical activity  Advanced directives  List of other physicians  Hospitalizations, surgeries, and ER visits in previous 12 months  Vitals  Screenings to include cognitive, depression, and falls  Referrals and appointments  In addition, I have reviewed and discussed with patient certain preventive  protocols, quality metrics, and best practice recommendations. A written personalized care plan for preventive services as well as general preventive health recommendations were provided to patient.  Sandrea Hughs, NP   09/22/2019   Nurse Notes:Declined Dexa Scan.she would like to get Influenza vaccine towards end of September

## 2019-09-24 ENCOUNTER — Encounter: Payer: Self-pay | Admitting: Internal Medicine

## 2019-09-25 ENCOUNTER — Other Ambulatory Visit: Payer: Self-pay | Admitting: Internal Medicine

## 2019-09-25 DIAGNOSIS — I10 Essential (primary) hypertension: Secondary | ICD-10-CM

## 2019-09-25 DIAGNOSIS — I080 Rheumatic disorders of both mitral and aortic valves: Secondary | ICD-10-CM

## 2019-09-28 ENCOUNTER — Telehealth: Payer: Self-pay

## 2019-09-29 NOTE — Telephone Encounter (Signed)
Please clarify. No message attached

## 2019-10-01 ENCOUNTER — Encounter: Payer: Self-pay | Admitting: Internal Medicine

## 2019-10-01 ENCOUNTER — Encounter: Payer: Self-pay | Admitting: Cardiology

## 2019-10-01 ENCOUNTER — Other Ambulatory Visit: Payer: Self-pay

## 2019-10-01 ENCOUNTER — Ambulatory Visit: Payer: Medicare PPO | Admitting: Cardiology

## 2019-10-01 ENCOUNTER — Ambulatory Visit (INDEPENDENT_AMBULATORY_CARE_PROVIDER_SITE_OTHER): Payer: Medicare PPO | Admitting: Internal Medicine

## 2019-10-01 VITALS — BP 182/63 | HR 68 | Resp 16 | Ht 63.0 in | Wt 157.0 lb

## 2019-10-01 VITALS — BP 164/64 | HR 65 | Temp 97.1°F | Resp 16 | Ht 63.0 in | Wt 156.4 lb

## 2019-10-01 DIAGNOSIS — R42 Dizziness and giddiness: Secondary | ICD-10-CM | POA: Diagnosis not present

## 2019-10-01 DIAGNOSIS — Z87891 Personal history of nicotine dependence: Secondary | ICD-10-CM | POA: Diagnosis not present

## 2019-10-01 DIAGNOSIS — R002 Palpitations: Secondary | ICD-10-CM | POA: Diagnosis not present

## 2019-10-01 DIAGNOSIS — I471 Supraventricular tachycardia: Secondary | ICD-10-CM | POA: Diagnosis not present

## 2019-10-01 DIAGNOSIS — G8929 Other chronic pain: Secondary | ICD-10-CM | POA: Diagnosis not present

## 2019-10-01 DIAGNOSIS — M545 Low back pain, unspecified: Secondary | ICD-10-CM

## 2019-10-01 DIAGNOSIS — I080 Rheumatic disorders of both mitral and aortic valves: Secondary | ICD-10-CM

## 2019-10-01 DIAGNOSIS — G2581 Restless legs syndrome: Secondary | ICD-10-CM

## 2019-10-01 DIAGNOSIS — Z66 Do not resuscitate: Secondary | ICD-10-CM | POA: Diagnosis not present

## 2019-10-01 DIAGNOSIS — I10 Essential (primary) hypertension: Secondary | ICD-10-CM | POA: Diagnosis not present

## 2019-10-01 DIAGNOSIS — I951 Orthostatic hypotension: Secondary | ICD-10-CM | POA: Diagnosis not present

## 2019-10-01 DIAGNOSIS — I209 Angina pectoris, unspecified: Secondary | ICD-10-CM | POA: Diagnosis not present

## 2019-10-01 MED ORDER — TRAMADOL HCL 50 MG PO TABS: 25.0000 mg | ORAL_TABLET | Freq: Every day | ORAL | 0 refills | Status: DC | PRN

## 2019-10-01 MED ORDER — LOSARTAN POTASSIUM 50 MG PO TABS: 50.0000 mg | ORAL_TABLET | Freq: Two times a day (BID) | ORAL | 0 refills | Status: DC

## 2019-10-01 NOTE — Patient Instructions (Addendum)
I recommend you take tylenol 500mg  twice a day routinely.   Also, let's try using tramadol just 25mg  daily as needed for severe pain.   I'd also suggest an abdominal compression binder/back brace to help both your back and your blood pressure dropping when you stand.

## 2019-10-01 NOTE — Progress Notes (Signed)
Date:  10/01/2019  ID:  Donna [name], DOB 08-28-1927, MRN [id number]  PCP:  Gayland Curry, DO  Cardiologist:  Rex Kras, DO, [facility] (established care 08/26/2019)  Date: 10/01/2019 Last Office Visit: 09/15/2019  Chief Complaint  Patient presents with  . Hypertension  . Follow-up    4 weeks  . Chest Pain    HPI  Donna [name] is a 86 y.o. female who presents to the office with a chief complaint of " chest pain and elevated blood pressures. Patient's past medical history and cardiovascular risk factors include: hypertension, supraventricular tachycardia, moderate aortic stenosis, mild LVOT obstruction, severe MR, migraine, GI bleeding, postmenopausal female, advanced age.  Patient is accompanied by her daughter at today's visit.  She is referred to the office at the request of her primary care provider for evaluation and management palpitations.  She presents to the office sooner than scheduled because she has been experiencing symptoms of chest pain and palpitations.  Patient states that on September 16 she was taking clothes out of her dryer and with that she started having chest pressure in the substernal region, intensity 8 out of 10, bilateral arm heaviness, symptoms improved with resting.  Lasted for about 40 minutes in duration, also experienced shortness of breath with activity.  For reasons unknown she did not seek medical attention at the closest ER via EMS.  Patient states that her symptoms of lightheaded, dizziness have improved since last visit.  She has tolerated the initiation of losartan 50 mg p.o. every afternoon well as well as Cardizem.  She denies symptoms of near syncope syncope or heart failure.  Patient states that at home her blood pressures range between 031-594 mmHg and diastolic blood pressures range between 60-75 mmHg and heart rate greater than 60 bpm.  Patient has a known history of cardiac murmur.  She had an echocardiogram in outside facility more than 5  years ago I do not have the results to review.  No history of rheumatic fever growing up.  Denies prior history of coronary artery disease, myocardial infarction, congestive heart failure, deep venous thrombosis, pulmonary embolism, stroke, transient ischemic attack.  FUNCTIONAL STATUS: No structured exercise program or daily routine. 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    ALLERGIES: No Known Allergies  MEDICATION LIST PRIOR TO VISIT: Current Meds  Medication Sig  . Blood Pressure Monitoring KIT Essential hypertension check bp daily  . Calcium Carbonate-Vitamin D 600-400 MG-UNIT per tablet Take 2 tablets by mouth daily.  . Coenzyme Q10 (COQ10) 400 MG CAPS Take 400 mg by mouth daily.   Marland Kitchen diltiazem (CARDIZEM CD) 120 MG 24 hr capsule Take 1 capsule (120 mg total) by mouth in the morning.  . Multiple Vitamins-Minerals (PRESERVISION AREDS 2) CAPS Take 1 capsule by mouth 2 (two) times a day.   Marland Kitchen OVER THE COUNTER MEDICATION Take 2 capsules by mouth daily. MitoQ  . SYNTHROID 100 MCG tablet TAKE 1 TABLET(100 MCG) BY MOUTH DAILY (Patient taking differently: Take 100 mcg by mouth daily before breakfast. )  . traMADol (ULTRAM) 50 MG tablet Take 0.5 tablets (25 mg total) by mouth daily as needed for severe pain.  . vitamin B-12 (CYANOCOBALAMIN) 1000 MCG tablet Take 1 tablet (1,000 mcg total) by mouth daily.  . [DISCONTINUED] losartan (COZAAR) 50 MG tablet Take 1 tablet (50 mg total) by mouth at bedtime.     PAST MEDICAL HISTORY: Past Medical History:  Diagnosis Date  .  Hemorrhage of gastrointestinal tract, unspecified   . Hypertension   . Migraine   . OA (osteoarthritis)   . Thyroid cyst     PAST SURGICAL HISTORY: Past Surgical History:  Procedure Laterality Date  . CATARACT EXTRACTION  1990   rt  . CATARACT EXTRACTION  2005   left  . REVISION TOTAL KNEE ARTHROPLASTY  2008  . THUMB ARTHROSCOPY    . TONSILLECTOMY      FAMILY HISTORY: The patient  family history includes Arthritis in her sister; Brain cancer in her brother; CVA (age of onset: 6) in her sister; CVA (age of onset: 9) in her father; Heart disease in her sister; Pancreatic cancer in her mother.  SOCIAL HISTORY:  The patient  reports that she quit smoking about 71 years ago. Her smoking use included cigarettes. She quit after 2.00 years of use. She has never used smokeless tobacco. She reports previous alcohol use. She reports that she does not use drugs.  REVIEW OF SYSTEMS: Review of Systems  Constitutional: Negative for chills and fever.  HENT: Negative for hoarse voice and nosebleeds.   Eyes: Negative for discharge, double vision and pain.  Cardiovascular: Positive for chest pain. Negative for claudication, dyspnea on exertion, leg swelling, near-syncope, orthopnea, palpitations, paroxysmal nocturnal dyspnea and syncope.  Respiratory: Positive for shortness of breath. Negative for hemoptysis.   Musculoskeletal: Negative for muscle cramps and myalgias.  Gastrointestinal: Negative for abdominal pain, constipation, diarrhea, hematemesis, hematochezia, melena, nausea and vomiting.  Neurological: Positive for dizziness, light-headedness and vertigo (last episode was 1.5 year ago ).    PHYSICAL EXAM: Vitals with BMI 10/01/2019 10/01/2019 09/15/2019  Height 5' 3"  5' 3"  5' 3"   Weight 157 lbs 156 lbs 6 oz 158 lbs  BMI 96.28 36.62 28  Systolic 947 654 650  Diastolic 63 64 67  Pulse 68 65 71   CONSTITUTIONAL: Well-developed and well-nourished. No acute distress.  SKIN: Skin is warm and dry. No rash noted. No cyanosis. No pallor. No jaundice HEAD: Normocephalic and atraumatic.  EYES: No scleral icterus MOUTH/THROAT: Moist oral membranes.  NECK: No JVD present. No thyromegaly noted.  Bilateral carotid bruits most likely secondary to delayed carotid upstrokes LYMPHATIC: No visible cervical adenopathy.  CHEST Normal respiratory effort. No intercostal retractions  LUNGS: Clear  to auscultation bilaterally.  No stridor. No wheezes. No rales.  CARDIOVASCULAR: Regular, positive S1 delayed S2, 3 out of 6 crescendo decrescendo murmur heard at the second right intercostal space, with a component of holosystolic murmur at the apex. ABDOMINAL: Soft, nontender, nondistended, no apparent ascites.  EXTREMITIES: Trace bilateral peripheral edema.  HEMATOLOGIC: No significant bruising NEUROLOGIC: Oriented to person, place, and time. Nonfocal. Normal muscle tone.  PSYCHIATRIC: Normal mood and affect. Normal behavior. Cooperative  CARDIAC DATABASE: EKG: 08/26/2019:Sinus  Rhythm, 67bpm, normal axis, PRWP, possible old inferior infarct, nonspecific ST-T changes in high lateral leads (I and avL).  Echocardiogram: 09/09/2019:  Left ventricle cavity is normal in size. Severe concentric hypertrophy of the left ventricle. Normal global wall motion. Normal LV systolic function with visual EF 50-55%. Doppler evidence of grade I (impaired) diastolic dysfunction, normal LAP.  Left atrial cavity is severely dilated.  Unable to clearly determine number of leaflets due to off axis view.  Moderate calcification. Mild aortic stenosis, in addition to mild LVOT obstruction. Aortic valve mean gradient of 18 mmHg, Vmax of 3.0 m/s. Calculated aortic valve area by continuity equation is 2 cm. Mild (grade I) aortic regurgitation. Moderate aortic stenosis. Moderate (Grade II) aortic  regurgitation.  Severe (Grade III) mitral regurgitation.  Moderate tricuspid regurgitation. Estimated pulmonary artery systolic pressure 28 mmHg.   Stress Testing: None  Heart Catheterization: None  Carotid artery duplex 09/09/2019:  Minimal stenosis in the right internal carotid artery (minimal).  Minimal stenosis in the left internal carotid artery (minimal).  Minimal plaque noted in the CCA bilaterally without significant stenosis.  Antegrade right vertebral artery flow. Antegrade left vertebral artery flow.    Follow up studies if clinically indicated.  7 day extended Holter monitor: Predominantly normal sinus rhythm (NSR).  HR 40-210 bpm.  Avg HR 79 bpm. Minimum HR 40 bpm on 08/29/19 at 1:02pm sinus with junctional escape. No atrial fibrillation/NSVT/pause (3 secs or longer). Total ventricular ectopic burden <1%. Total supraventricular ectopic burden <1%. 42 episodes of SVT.  Fastest and longest run was 1hr 17 mins with max HR 210 bpm (avg 155 bpm). Patient triggered events: 6.  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   LABORATORY DATA: CBC Latest Ref Rng & Units 06/04/2019 04/02/2019 12/08/2018  WBC 3.8 - 10.8 Thousand/uL 4.4 4.8 5.2  Hemoglobin 11.7 - 15.5 g/dL 14.1 14.3 13.7  Hematocrit 35 - 45 % 41.9 42.0 39.7  Platelets 140 - 400 Thousand/uL 175 167 181    CMP Latest Ref Rng & Units 06/04/2019 04/02/2019 12/08/2018  Glucose 65 - 99 mg/dL 99 95 86  BUN 7 - 25 mg/dL 15 17 21   Creatinine 0.60 - 0.88 mg/dL 0.89(H) 0.88 0.86  Sodium 135 - 146 mmol/L 137 138 136  Potassium 3.5 - 5.3 mmol/L 5.0 4.5 4.8  Chloride 98 - 110 mmol/L 102 104 102  CO2 20 - 32 mmol/L 28 29 28   Calcium 8.6 - 10.4 mg/dL 10.3 10.2 10.0  Total Protein 6.1 - 8.1 g/dL 6.0(L) - 6.0(L)  Total Bilirubin 0.2 - 1.2 mg/dL 0.5 - 0.5  Alkaline Phos 33 - 130 U/L - - -  AST 10 - 35 U/L 14 - 15  ALT 6 - 29 U/L 18 - 17    Lipid Panel     Component Value Date/Time   CHOL 112 12/08/2018 1004   CHOL 118 08/04/2014 0806   TRIG 80 12/08/2018 1004   HDL 34 (L) 12/08/2018 1004   HDL 34 (L) 08/04/2014 0806   CHOLHDL 3.3 12/08/2018 1004   VLDL 15 08/22/2016 0910   LDLCALC 62 12/08/2018 1004   LABVLDL 15 08/04/2014 0806    No components found for: NTPROBNP No results for input(s): PROBNP in the last 8760 hours. Recent Labs    12/08/18 1004 06/04/19 0810  TSH 4.58* 3.69    BMP Recent Labs    12/08/18 1004 04/02/19 0821 06/04/19 0810  NA 136 138 137  K  4.8 4.5 5.0  CL 102 104 102  CO2 28 29 28   GLUCOSE 86 95 99  BUN 21 17 15   CREATININE 0.86 0.88 0.89*  CALCIUM 10.0 10.2 10.3  GFRNONAA 60  --  57*  GFRAA 70  --  67    HEMOGLOBIN A1C Lab Results  Component Value Date   HGBA1C 5.1 08/22/2016   MPG 100 08/22/2016    IMPRESSION:    ICD-10-CM   1. Angina pectoris (HCC)  I20.9 Magnesium    Basic metabolic panel    SARS-COV-2 RNA,(COVID-19) QUAL NAAT    CBC    Basic metabolic panel    Magnesium  2. Paroxysmal SVT (supraventricular tachycardia) (HCC)  I47.1   3.  Essential hypertension, benign  I10 losartan (COZAAR) 50 MG tablet    Magnesium    Basic metabolic panel    Basic metabolic panel    Magnesium  4. Mitral regurgitation and aortic stenosis  I08.0   5. Palpitations  R00.2   6. Former smoker  Z87.891      RECOMMENDATIONS: Donna [name] is a 85 y.o. female whose past medical history and cardiac risk factors include: hypertension, supraventricular tachycardia, moderate aortic stenosis, mild LVOT obstruction, severe MR, migraine, GI bleeding, postmenopausal female, advanced age.  Angina pectoris:  Patient symptoms of chest discomfort are typical in nature; however, she is undergone an echo and stress test recently results of which were reviewed with her and her daughter at today's office visit.  Her symptoms could be secondary to hemodynamics resulting from severe left ventricular hypertrophy, aortic stenosis, mitral regurgitation.  She also has palpitations and extended Holter monitor noted episodes of supraventricular tachycardia and possible underlying AVNRT.  Given her recent episode of chest pain, heaviness in bilateral extremities, symptoms brought on by effort and resolved with rest she is concerned about obstructive CAD despite normal stress test.  Shared decision was to proceed with left heart catheterization with possible intervention.  The procedure of left heart catheterization with possible intervention was  explained to the patient in detail. The indication, alternatives, risks and benefits were reviewed. Complications include but not limited to bleeding, infection, vascular injury, stroke, myocardial infection, arrhythmia, kidney injury, radiation-related injury in the case of prolonged fluoroscopy use, emergency cardiac surgery, and death. The patient understands the risks of serious complication is 1-2 in 5697 with diagnostic cardiac cath and 1-2% or less with angioplasty/stenting.   I have discussed in particular detail the possibility of acute renal failure after coronary angiography particularly if PCI is necessary.  I have described that it is possible patient may need short-term dialysis and the less likely may also require long-term dialysis depending on renal recovery.  I have asked for consent to proceed with coronary angiography understanding the risks of potentially needing dialysis, as well as the risks as stated above.  The patient and daughter voices understanding and provides verbal feedback and wishes to proceed with coronary angiography with possible PCI.  Aortic stenosis:  Patient is scheduled for another echocardiogram in February 2022 to reevaluate the severity and progression of aortic stenosis with [facility].  Patient would also benefit from a Langston's catheter to obtain hemodynamics during her upcoming left heart catheterization.  Discussed with interventional cardiology.  Patient is aware to seek medical attention if she has symptoms of chest pain, syncope, heart failure.  Severe MR:  Since her dizziness and lightheadedness is improved recommend uptitrating her antihypertensive medications.  Increase losartan to 50 mg p.o. twice daily.  Continue to monitor.  Supraventricular tachycardia:  Currently on Cardizem 120 mg p.o. daily  Symptoms of palpitations have improved.  We will consider cardiac electrophysiology consultation for reevaluation of her  supraventricular tachycardia once ischemic evaluation is complete.  Patient is agreeable with the plan of care.  FINAL MEDICATION LIST END OF ENCOUNTER: Meds ordered this encounter  Medications  . losartan (COZAAR) 50 MG tablet    Sig: Take 1 tablet (50 mg total) by mouth in the morning and at bedtime.    Dispense:  180 tablet    Refill:  0    Medications Discontinued During This Encounter  Medication Reason  . losartan (COZAAR) 50 MG tablet Dose change     Current  Outpatient Medications:  .  Blood Pressure Monitoring KIT, Essential hypertension check bp daily, Disp: 1 kit, Rfl: 0 .  Calcium Carbonate-Vitamin D 600-400 MG-UNIT per tablet, Take 2 tablets by mouth daily., Disp: , Rfl:  .  Coenzyme Q10 (COQ10) 400 MG CAPS, Take 400 mg by mouth daily. , Disp: , Rfl:  .  diltiazem (CARDIZEM CD) 120 MG 24 hr capsule, Take 1 capsule (120 mg total) by mouth in the morning., Disp: 90 capsule, Rfl: 0 .  Multiple Vitamins-Minerals (PRESERVISION AREDS 2) CAPS, Take 1 capsule by mouth 2 (two) times a day. , Disp: , Rfl:  .  OVER THE COUNTER MEDICATION, Take 2 capsules by mouth daily. MitoQ, Disp: , Rfl:  .  SYNTHROID 100 MCG tablet, TAKE 1 TABLET(100 MCG) BY MOUTH DAILY (Patient taking differently: Take 100 mcg by mouth daily before breakfast. ), Disp: 90 tablet, Rfl: 1 .  traMADol (ULTRAM) 50 MG tablet, Take 0.5 tablets (25 mg total) by mouth daily as needed for severe pain., Disp: 15 tablet, Rfl: 0 .  vitamin B-12 (CYANOCOBALAMIN) 1000 MCG tablet, Take 1 tablet (1,000 mcg total) by mouth daily., Disp: 30 tablet, Rfl: 5 .  losartan (COZAAR) 50 MG tablet, Take 1 tablet (50 mg total) by mouth in the morning and at bedtime. (Patient taking differently: Take 50 mg by mouth daily. ), Disp: 180 tablet, Rfl: 0  Orders Placed This Encounter  Procedures  . SARS-COV-2 RNA,(COVID-19) QUAL NAAT  . Magnesium  . Basic metabolic panel  . CBC    There are no Patient Instructions on file for this visit.    --Continue cardiac medications as reconciled in final medication list. --Return in about 4 weeks (around 10/29/2019) for Post heart catheterization. Or sooner if needed. --Continue follow-up with your primary care physician regarding the management of your other chronic comorbid conditions.  Patient's questions and concerns were addressed to her satisfaction. She voices understanding of the instructions provided during this encounter.   This note was created using a voice recognition software as a result there may be grammatical errors inadvertently enclosed that do not reflect the nature of this encounter. Every attempt is made to correct such errors.  Total time spent: 45 minutes reevaluating her symptoms as she came into the office sooner than her scheduled appointment.  Discussed the risks, benefits, alternatives to left heart catheterization with possible intervention.  Coordination of care.  Scheduling upcoming tests and procedures.  Rex Kras, [place], [facility]  Pager: [phone] Office: [phone]

## 2019-10-01 NOTE — Progress Notes (Signed)
Location:  [facility] clinic Provider:  Madigan Rosensteel L. Mariea Clonts, D.O., C.M.D.  Code Status: DNR Goals of Care:  Advanced Directives 10/01/2019  Does Patient Have a Medical Advance Directive? Yes  Type of Paramedic of [place];Out of facility DNR (pink MOST or yellow form)  Does patient want to make changes to medical advance directive? No - Patient declined  Copy of [place] in Chart? Yes - validated most recent copy scanned in chart (See row information)  Pre-existing out of facility DNR order (yellow form or pink MOST form) -   Chief Complaint  Patient presents with  . Follow-up    6 week follow up for blood pressure and dizziness.    HPI: Patient is a 86 y.o. female seen today for medical management of chronic diseases--followup on bp and dizziness.    She is back on a losartan 22m 1/2 tablet a day which did not help, then increased to a full tablet at night thru cardiology.  After stress test and echo, she had cardizem added 1258meach morning.    She had an episode with her heart over the weekend.  Her heart was not racing as much, but had the heaviness in her arms, was weak and had to sit down.  She had pain in her chest, throat and both arms.  Lasted longer than others--almost 40 mins.    She says she now feels anxious all the time.  She never thought she'd have heart problems b/c she's always been so healthy.  She cannot get the compression stockings on.  Her dizziness is not better.  She's had some of it, but not a lot.  She's had lightheadedness.  She was told at cardiology that her stress test and carotid dopplers were ok.  Echo showed some significant valvular disease.  Repeat echo will be in February.   CoQ10 has helped her restless leg episodes.  Her back is getting worse.  She's tried to exercise her back a little.  She is also getting fatigued really easily.  She can go to a store on a single floor and be really exhausted.  She sometimes  cannot straighten up to walk.  Just across the back, not down the legs.  She has tried a regular dose of tylenol.    Past Medical History:  Diagnosis Date  . Hemorrhage of gastrointestinal tract, unspecified   . Hypertension   . Migraine   . OA (osteoarthritis)   . Thyroid cyst     Past Surgical History:  Procedure Laterality Date  . CATARACT EXTRACTION  1990   rt  . CATARACT EXTRACTION  2005   left  . REVISION TOTAL KNEE ARTHROPLASTY  2008  . THUMB ARTHROSCOPY    . TONSILLECTOMY      No Known Allergies  Outpatient Encounter Medications as of 10/01/2019  Medication Sig  . Blood Pressure Monitoring KIT Essential hypertension check bp daily  . Calcium Carbonate-Vitamin D 600-400 MG-UNIT per tablet Take 2 tablets by mouth daily.  . Coenzyme Q10 (COQ10) 400 MG CAPS Take 1 tablet by mouth daily.  . Marland Kitcheniltiazem (CARDIZEM CD) 120 MG 24 hr capsule Take 1 capsule (120 mg total) by mouth in the morning.  . Multiple Vitamins-Minerals (PRESERVISION AREDS 2) CAPS Take 1 capsule by mouth 2 (two) times a day.   . Marland KitchenVER THE COUNTER MEDICATION MitoQ 2 capsules by mouth daily  . SYNTHROID 100 MCG tablet TAKE 1 TABLET(100 MCG) BY MOUTH DAILY  .  vitamin B-12 (CYANOCOBALAMIN) 1000 MCG tablet Take 1 tablet (1,000 mcg total) by mouth daily.  . [DISCONTINUED] losartan (COZAAR) 50 MG tablet Take 1 tablet (50 mg total) by mouth at bedtime.  . traMADol (ULTRAM) 50 MG tablet Take 0.5 tablets (25 mg total) by mouth daily as needed for severe pain.   No facility-administered encounter medications on file as of 10/01/2019.    Review of Systems:  Review of Systems  Constitutional: Negative for chills, fever and malaise/fatigue.  HENT: Negative for congestion and sore throat.   Eyes: Negative for blurred vision.  Respiratory: Negative for cough and shortness of breath.   Cardiovascular: Positive for chest pain and palpitations. Negative for orthopnea, leg swelling and PND.       Dyspnea on minimal exertion   Gastrointestinal: Negative for abdominal pain, blood in stool, constipation, diarrhea and melena.  Genitourinary: Negative for dysuria.  Musculoskeletal: Negative for falls and joint pain.    Health Maintenance  Topic Date Due  . INFLUENZA VACCINE  10/01/2019 (Originally 08/16/2019)  . DEXA SCAN  09/21/2020 (Originally 01/04/1995)  . TETANUS/TDAP  08/28/2023  . COVID-19 Vaccine  Completed  . PNA vac Low Risk Adult  Completed    Physical Exam: Vitals:   10/01/19 0940  BP: (!) 164/64  Pulse: 65  Resp: 16  Temp: (!) 97.1 F (36.2 C)  SpO2: 97%  Weight: 156 lb 6.4 oz (70.9 kg)  Height: 5' 3"  (1.6 m)   Body mass index is 27.71 kg/m. Physical Exam Vitals reviewed.  Constitutional:      General: She is not in acute distress.    Appearance: Normal appearance. She is not toxic-appearing.  HENT:     Head: Normocephalic and atraumatic.  Cardiovascular:     Rate and Rhythm: Normal rate and regular rhythm.     Heart sounds: Murmur heard.      Comments: 2/6 systolic murmur audible throughout precordium Pulmonary:     Effort: Pulmonary effort is normal.     Breath sounds: Normal breath sounds. No wheezing, rhonchi or rales.  Abdominal:     General: Bowel sounds are normal.  Musculoskeletal:        General: Normal range of motion.     Cervical back: Neck supple.     Right lower leg: No edema.     Left lower leg: No edema.  Skin:    General: Skin is warm and dry.     Coloration: Skin is pale.  Neurological:     General: No focal deficit present.     Mental Status: She is alert and oriented to person, place, and time.     Comments: Unsteady upon standing--swayed to the side  Psychiatric:        Mood and Affect: Mood normal.     Labs reviewed: Basic Metabolic Panel: Recent Labs    12/08/18 1004 04/02/19 0821 06/04/19 0810  NA 136 138 137  K 4.8 4.5 5.0  CL 102 104 102  CO2 28 29 28   GLUCOSE 86 95 99  BUN 21 17 15   CREATININE 0.86 0.88 0.89*  CALCIUM 10.0 10.2  10.3  TSH 4.58*  --  3.69   Liver Function Tests: Recent Labs    12/08/18 1004 06/04/19 0810  AST 15 14  ALT 17 18  BILITOT 0.5 0.5  PROT 6.0* 6.0*   No results for input(s): LIPASE, AMYLASE in the last 8760 hours. No results for input(s): AMMONIA in the last 8760 hours. CBC: Recent Labs  12/08/18 1004 04/02/19 0821 06/04/19 0810  WBC 5.2 4.8 4.4  NEUTROABS 3,442 2,918 2,803  HGB 13.7 14.3 14.1  HCT 39.7 42.0 41.9  MCV 88.8 89.6 88.2  PLT 181 167 175   Lipid Panel: Recent Labs    12/08/18 1004  CHOL 112  HDL 34*  LDLCALC 62  TRIG 80  CHOLHDL 3.3   Lab Results  Component Value Date   HGBA1C 5.1 08/22/2016    Procedures since last visit: PCV ECHOCARDIOGRAM COMPLETE  Result Date: 09/13/2019 Echocardiogram 09/09/2019: Left ventricle cavity is normal in size. Severe concentric hypertrophy of the left ventricle. Normal global wall motion. Normal LV systolic function with visual EF 50-55%. Doppler evidence of grade I (impaired) diastolic dysfunction, normal LAP. Left atrial cavity is severely dilated. Unable to clearly determine number of leaflets due to off axis view. Moderate calcification. Mild aortic stenosis, in addition to mild LVOT obstruction. Aortic valve mean gradient of 18 mmHg, Vmax of 3.0  m/s. Calculated aortic valve area by continuity equation is 2 cm. Mild (grade I) aortic regurgitation. Moderate aortic stenosis. Moderate (Grade II) aortic regurgitation. Severe (Grade III) mitral regurgitation. Moderate tricuspid regurgitation. Estimated pulmonary artery systolic pressure 28 mmHg. Differentials include hypertensive of hypertrophic cardiomyopathy.   PCV CAROTID DUPLEX (BILATERAL)  Result Date: 09/13/2019 Carotid artery duplex  09/09/2019: Minimal stenosis in the right internal carotid artery (minimal). Minimal stenosis in the left internal carotid artery (minimal). Minimal plaque noted in the CCA bilaterally without significant stenosis. Antegrade right  vertebral artery flow. Antegrade left vertebral artery flow. Follow up studies if clinically indicated.   PCV MYOCARDIAL PERFUSION WITH LEXISCAN  Result Date: 09/15/2019 Lexiscan/modified Bruce Tetrofosmin stress test 09/14/2019: Lexiscan/modified Bruce nuclear stress test performed using 1-day protocol. Stress EKG is non-diagnostic, as this is pharmacological stress test. In addition, stress EKG at 84% MPHR showed sinus tachycardia, T wave inversion/ 1.5-2 mm downsloping ST depression in leads II, III, aVF, V6-partially normalize two min into recovery. Stress LVEF low normal at 49%, TID of 1.13. In absence of regional myocardial perfusion defects, these findings are nonspecific. Recommend clinical correlation.    Assessment/Plan 1. Chronic bilateral low back pain without sciatica - this has gotten worse - says she's tried tylenol alone to no avail but when I suggested regular tylenol twice a day, it sounded like she had not done that so she's going to try that - I did not recommend mobic right now with her cardiac workup still being done - will give her a small supply of tramadol to try short-term (contract not done b/c I'm not sure she'll even tolerate this or want to keep taking) for severe pain not relieved with tylenol and stretches - traMADol (ULTRAM) 50 MG tablet; Take 0.5 tablets (25 mg total) by mouth daily as needed for severe pain.  Dispense: 15 tablet; Refill: 0  2. Dizziness -remains a problem--was found to be orthostatic at cardiology office her last visit -see below for interventions  3. Restless legs syndrome (RLS) -improved w/ CoQ10  4. Orthostatic hypotension -recommended continued hydration, has been unable to wear compression hose -suggested abdominal binder that might also support her back which gives her so much pain -f/u with cardiology as planned later today  5. DNR (do not resuscitate) - Do not attempt resuscitation (DNR) discussed in the past, but order  expired  Labs/tests ordered:  Lab Orders  No laboratory test(s) ordered today   Next appt:  11/12/2019  Brittane Grudzinski L. Fantashia Shupert, D.O. [facility] [address], [place] [address] Cell Phone (Mon-Fri 8am-5pm):  [phone] On Call:  [phone] & follow prompts after 5pm & weekends Office Phone:  [phone] Office Fax:  [phone]

## 2019-10-02 ENCOUNTER — Other Ambulatory Visit (HOSPITAL_COMMUNITY)
Admission: RE | Admit: 2019-10-02 | Discharge: 2019-10-02 | Disposition: A | Discharge status: Home / Self Care | Payer: Medicare PPO | Source: Ambulatory Visit | Attending: Cardiology | Admitting: Cardiology

## 2019-10-02 DIAGNOSIS — R0600 Dyspnea, unspecified: Secondary | ICD-10-CM

## 2019-10-02 DIAGNOSIS — R0609 Other forms of dyspnea: Secondary | ICD-10-CM

## 2019-10-02 DIAGNOSIS — R9439 Abnormal result of other cardiovascular function study: Secondary | ICD-10-CM

## 2019-10-02 DIAGNOSIS — Z20822 Contact with and (suspected) exposure to covid-19: Secondary | ICD-10-CM | POA: Diagnosis not present

## 2019-10-02 DIAGNOSIS — Z01812 Encounter for preprocedural laboratory examination: Secondary | ICD-10-CM | POA: Insufficient documentation

## 2019-10-02 DIAGNOSIS — I209 Angina pectoris, unspecified: Secondary | ICD-10-CM | POA: Diagnosis not present

## 2019-10-02 LAB — SARS CORONAVIRUS 2 (TAT 6-24 HRS): SARS Coronavirus 2: NEGATIVE

## 2019-10-03 LAB — CBC
Hematocrit: 38.6 % (ref 34.0–46.6)
Hemoglobin: 13.4 g/dL (ref 11.1–15.9)
MCH: 30.1 pg (ref 26.6–33.0)
MCHC: 34.7 g/dL (ref 31.5–35.7)
MCV: 87 fL (ref 79–97)
Platelets: 150 10*3/uL (ref 150–450)
RBC: 4.45 x10E6/uL (ref 3.77–5.28)
RDW: 12.6 % (ref 11.7–15.4)
WBC: 4.9 10*3/uL (ref 3.4–10.8)

## 2019-10-06 ENCOUNTER — Encounter (HOSPITAL_COMMUNITY)
Admission: RE | Disposition: A | Discharge status: Home / Self Care | Payer: Self-pay | Source: Home / Self Care | Attending: Cardiology

## 2019-10-06 ENCOUNTER — Ambulatory Visit (HOSPITAL_COMMUNITY)
Admission: RE | Admit: 2019-10-06 | Discharge: 2019-10-06 | Disposition: A | Discharge status: Home / Self Care | Payer: Medicare PPO | Attending: Cardiology | Admitting: Cardiology

## 2019-10-06 ENCOUNTER — Other Ambulatory Visit: Payer: Self-pay

## 2019-10-06 ENCOUNTER — Encounter (HOSPITAL_COMMUNITY): Payer: Self-pay | Admitting: Cardiology

## 2019-10-06 DIAGNOSIS — R0609 Other forms of dyspnea: Secondary | ICD-10-CM | POA: Diagnosis not present

## 2019-10-06 DIAGNOSIS — Z8249 Family history of ischemic heart disease and other diseases of the circulatory system: Secondary | ICD-10-CM | POA: Insufficient documentation

## 2019-10-06 DIAGNOSIS — Z79890 Hormone replacement therapy (postmenopausal): Secondary | ICD-10-CM | POA: Insufficient documentation

## 2019-10-06 DIAGNOSIS — R9439 Abnormal result of other cardiovascular function study: Secondary | ICD-10-CM

## 2019-10-06 DIAGNOSIS — Z8261 Family history of arthritis: Secondary | ICD-10-CM | POA: Diagnosis not present

## 2019-10-06 DIAGNOSIS — I119 Hypertensive heart disease without heart failure: Secondary | ICD-10-CM | POA: Diagnosis not present

## 2019-10-06 DIAGNOSIS — Z87891 Personal history of nicotine dependence: Secondary | ICD-10-CM | POA: Diagnosis not present

## 2019-10-06 DIAGNOSIS — Z79899 Other long term (current) drug therapy: Secondary | ICD-10-CM | POA: Diagnosis not present

## 2019-10-06 DIAGNOSIS — R0600 Dyspnea, unspecified: Secondary | ICD-10-CM | POA: Diagnosis not present

## 2019-10-06 DIAGNOSIS — M1990 Unspecified osteoarthritis, unspecified site: Secondary | ICD-10-CM | POA: Insufficient documentation

## 2019-10-06 HISTORY — PX: LEFT HEART CATH AND CORONARY ANGIOGRAPHY: CATH118249

## 2019-10-06 LAB — BASIC METABOLIC PANEL
Anion gap: 9 (ref 5–15)
BUN: 13 mg/dL (ref 8–23)
CO2: 24 mmol/L (ref 22–32)
Calcium: 9.5 mg/dL (ref 8.9–10.3)
Chloride: 103 mmol/L (ref 98–111)
Creatinine, Ser: 0.74 mg/dL (ref 0.44–1.00)
GFR calc Af Amer: >60 mL/min (ref >60)
GFR calc non Af Amer: >60 mL/min (ref >60)
Glucose, Bld: 91 mg/dL (ref 70–99)
Potassium: 4.9 mmol/L (ref 3.5–5.1)
Sodium: 136 mmol/L (ref 135–145)

## 2019-10-06 SURGERY — LEFT HEART CATH AND CORONARY ANGIOGRAPHY
Anesthesia: LOCAL

## 2019-10-06 MED ORDER — HEPARIN SODIUM (PORCINE) 1000 UNIT/ML IJ SOLN
INTRAMUSCULAR | Status: AC
  Filled 2019-10-06: qty 1

## 2019-10-06 MED ORDER — HEPARIN (PORCINE) IN NACL 1000-0.9 UT/500ML-% IV SOLN
INTRAVENOUS | Status: DC | PRN
  Administered 2019-10-06 (×3): 500 mL

## 2019-10-06 MED ORDER — HEPARIN (PORCINE) IN NACL 1000-0.9 UT/500ML-% IV SOLN
INTRAVENOUS | Status: AC
  Filled 2019-10-06: qty 1000

## 2019-10-06 MED ORDER — HYDRALAZINE HCL 20 MG/ML IJ SOLN: 10.0000 mg | INTRAMUSCULAR | Status: DC | PRN

## 2019-10-06 MED ORDER — SODIUM CHLORIDE 0.9 % IV SOLN: 250.0000 mL | INTRAVENOUS | Status: DC | PRN

## 2019-10-06 MED ORDER — SODIUM CHLORIDE 0.9 % WEIGHT BASED INFUSION
3.0000 mL/kg/h | INTRAVENOUS | Status: AC
  Administered 2019-10-06: 3 mL/kg/h via INTRAVENOUS

## 2019-10-06 MED ORDER — ONDANSETRON HCL 4 MG/2ML IJ SOLN: 4.0000 mg | Freq: Four times a day (QID) | INTRAMUSCULAR | Status: DC | PRN

## 2019-10-06 MED ORDER — SODIUM CHLORIDE 0.9% FLUSH: 3.0000 mL | INTRAVENOUS | Status: DC | PRN

## 2019-10-06 MED ORDER — IOHEXOL 350 MG/ML SOLN
INTRAVENOUS | Status: DC | PRN
  Administered 2019-10-06: 100 mL

## 2019-10-06 MED ORDER — LABETALOL HCL 5 MG/ML IV SOLN: 10.0000 mg | INTRAVENOUS | Status: DC | PRN

## 2019-10-06 MED ORDER — MIDAZOLAM HCL 2 MG/2ML IJ SOLN
INTRAMUSCULAR | Status: AC
  Filled 2019-10-06: qty 2

## 2019-10-06 MED ORDER — VERAPAMIL HCL 2.5 MG/ML IV SOLN
INTRAVENOUS | Status: DC | PRN
  Administered 2019-10-06: 10 mL via INTRA_ARTERIAL

## 2019-10-06 MED ORDER — ACETAMINOPHEN 325 MG PO TABS: 650.0000 mg | ORAL_TABLET | ORAL | Status: DC | PRN

## 2019-10-06 MED ORDER — HEPARIN SODIUM (PORCINE) 1000 UNIT/ML IJ SOLN
INTRAMUSCULAR | Status: DC | PRN
  Administered 2019-10-06: 4000 [IU] via INTRAVENOUS

## 2019-10-06 MED ORDER — VERAPAMIL HCL 2.5 MG/ML IV SOLN
INTRAVENOUS | Status: AC
  Filled 2019-10-06: qty 2

## 2019-10-06 MED ORDER — LIDOCAINE HCL (PF) 1 % IJ SOLN
INTRAMUSCULAR | Status: AC
  Filled 2019-10-06: qty 30

## 2019-10-06 MED ORDER — SODIUM CHLORIDE 0.9% FLUSH: 3.0000 mL | Freq: Two times a day (BID) | INTRAVENOUS | Status: DC

## 2019-10-06 MED ORDER — MIDAZOLAM HCL 2 MG/2ML IJ SOLN
INTRAMUSCULAR | Status: DC | PRN
  Administered 2019-10-06: 1 mg via INTRAVENOUS

## 2019-10-06 MED ORDER — LIDOCAINE HCL (PF) 1 % IJ SOLN
INTRAMUSCULAR | Status: DC | PRN
  Administered 2019-10-06: 2 mL

## 2019-10-06 MED ORDER — SODIUM CHLORIDE 0.9 % IV SOLN: INTRAVENOUS | Status: AC

## 2019-10-06 MED ORDER — ASPIRIN 81 MG PO CHEW
81.0000 mg | CHEWABLE_TABLET | ORAL | Status: AC
  Administered 2019-10-06: 81 mg via ORAL
  Filled 2019-10-06: qty 1

## 2019-10-06 MED ORDER — SODIUM CHLORIDE 0.9 % WEIGHT BASED INFUSION: 1.0000 mL/kg/h | INTRAVENOUS | Status: DC

## 2019-10-06 MED ORDER — FENTANYL CITRATE (PF) 100 MCG/2ML IJ SOLN
INTRAMUSCULAR | Status: DC | PRN
Start: 2019-10-06 — End: 2019-10-06
  Administered 2019-10-06: 50 ug via INTRAVENOUS

## 2019-10-06 MED ORDER — FENTANYL CITRATE (PF) 100 MCG/2ML IJ SOLN
INTRAMUSCULAR | Status: AC
  Filled 2019-10-06: qty 2

## 2019-10-06 MED ORDER — NITROGLYCERIN 1 MG/10 ML FOR IR/CATH LAB
INTRA_ARTERIAL | Status: DC | PRN
  Administered 2019-10-06: 200 ug

## 2019-10-06 SURGICAL SUPPLY — 12 items
CATH 5FR JL3.5 JR4 ANG PIG MP (CATHETERS) ×1 IMPLANT
CATH INFINITI 5 FR 3DRC (CATHETERS) ×1 IMPLANT
DEVICE RAD TR BAND REGULAR (VASCULAR PRODUCTS) ×1 IMPLANT
GLIDESHEATH SLEND A-KIT 6F 22G (SHEATH) ×1 IMPLANT
GUIDEWIRE INQWIRE 1.5J.035X260 (WIRE) IMPLANT
INQWIRE 1.5J .035X260CM (WIRE) ×2
KIT HEART LEFT (KITS) ×2 IMPLANT
PACK CARDIAC CATHETERIZATION (CUSTOM PROCEDURE TRAY) ×2 IMPLANT
SYR MEDRAD MARK 7 150ML (SYRINGE) ×2 IMPLANT
TRANSDUCER W/STOPCOCK (MISCELLANEOUS) ×2 IMPLANT
TUBING CIL FLEX 10 FLL-RA (TUBING) ×2 IMPLANT
WIRE HI TORQ VERSACORE-J 145CM (WIRE) ×1 IMPLANT

## 2019-10-06 NOTE — Discharge Instructions (Signed)
Radial Site Care  This sheet gives you information about how to care for yourself after your procedure. Your health care provider may also give you more specific instructions. If you have problems or questions, contact your health care provider. What can I expect after the procedure? After the procedure, it is common to have:  Bruising and tenderness at the catheter insertion area. Follow these instructions at home: Medicines  Take over-the-counter and prescription medicines only as told by your health care provider. Insertion site care  Follow instructions from your health care provider about how to take care of your insertion site. Make sure you: ? Wash your hands with soap and water before you change your bandage (dressing). If soap and water are not available, use hand sanitizer. ? Change your dressing as told by your health care provider. ? Leave stitches (sutures), skin glue, or adhesive strips in place. These skin closures may need to stay in place for 2 weeks or longer. If adhesive strip edges start to loosen and curl up, you may trim the loose edges. Do not remove adhesive strips completely unless your health care provider tells you to do that.  Check your insertion site every day for signs of infection. Check for: ? Redness, swelling, or pain. ? Fluid or blood. ? Pus or a bad smell. ? Warmth.  Do not take baths, swim, or use a hot tub until your health care provider approves.  You may shower 24-48 hours after the procedure, or as directed by your health care provider. ? Remove the dressing and gently wash the site with plain soap and water. ? Pat the area dry with a clean towel. ? Do not rub the site. That could cause bleeding.  Do not apply powder or lotion to the site. Activity   For 24 hours after the procedure, or as directed by your health care provider: ? Do not flex or bend the affected arm. ? Do not push or pull heavy objects with the affected arm. ? Do not  drive yourself home from the hospital or clinic. You may drive 24 hours after the procedure unless your health care provider tells you not to. ? Do not operate machinery or power tools.  Do not lift anything that is heavier than 10 lb (4.5 kg), or the limit that you are told, until your health care provider says that it is safe.  Ask your health care provider when it is okay to: ? Return to work or school. ? Resume usual physical activities or sports. ? Resume sexual activity. General instructions  If the catheter site starts to bleed, raise your arm and put firm pressure on the site. If the bleeding does not stop, get help right away. This is a medical emergency.  If you went home on the same day as your procedure, a responsible adult should be with you for the first 24 hours after you arrive home.  Keep all follow-up visits as told by your health care provider. This is important. Contact a health care provider if:  You have a fever.  You have redness, swelling, or yellow drainage around your insertion site. Get help right away if:  You have unusual pain at the radial site.  The catheter insertion area swells very fast.  The insertion area is bleeding, and the bleeding does not stop when you hold steady pressure on the area.  Your arm or hand becomes pale, cool, tingly, or numb. These symptoms may represent a serious problem   that is an emergency. Do not wait to see if the symptoms will go away. Get medical help right away. Call your local emergency services (911 in the [place] Do not drive yourself to the hospital. Summary  After the procedure, it is common to have bruising and tenderness at the site.  Follow instructions from your health care provider about how to take care of your radial site wound. Check the wound every day for signs of infection.  Do not lift anything that is heavier than 10 lb (4.5 kg), or the limit that you are told, until your health care provider says  that it is safe. This information is not intended to replace advice given to you by your health care provider. Make sure you discuss any questions you have with your health care provider. Document Revised: 02/06/2017 Document Reviewed: 02/06/2017 Elsevier Patient Education  2020 [employer]  

## 2019-10-06 NOTE — Interval H&P Note (Signed)
History and Physical Interval Note:  10/06/2019 2:28 PM  Donna [name]  has presented today for surgery, with the diagnosis of hypertension.  The various methods of treatment have been discussed with the patient and family. After consideration of risks, benefits and other options for treatment, the patient has consented to  Procedure(s): LEFT HEART CATH AND CORONARY ANGIOGRAPHY (N/A) as a surgical intervention.  The patient's history has been reviewed, patient examined, no change in status, stable for surgery.  I have reviewed the patient's chart and labs.  Questions were answered to the patient's satisfaction.    2016/2017 Appropriate Use Criteria for Coronary Revascularization Clinical Presentation: Diabetes Mellitus? Symptom Status? S/P CABG? Antianginal Therapy (# of long-acting drugs)? Results of Non-invasive testing? FFR/iFR results in all diseased vessels? Patient undergoing renal transplant? Patient undergoing percutaneous valve procedure (TAVR, MitraClip, Others)? Symptom Status:  Ischemic Symptoms  Non-invasive Testing:  High risk  If no or indeterminate stress test, FFR/iFR results in all diseased vessels:  N/A  Diabetes Mellitus:  No  S/P CABG:  No  Antianginal therapy (number of long-acting drugs):  1  Patient undergoing renal transplant:  No  Patient undergoing percutaneous valve procedure:  No    newline 1 Vessel Disease PCI CABG  No proximal LAD involvement, No proximal left dominant LCX involvement M (6); Indication 2 M (4); Indication 2   Proximal left dominant LCX involvement A (7); Indication 5 A (7); Indication 5   Proximal LAD involvement A (7); Indication 5 A (7); Indication 5   newline 2 Vessel Disease  No proximal LAD involvement A (7); Indication 8 M (6); Indication 8   Proximal LAD involvement A (7); Indication 11 A (7); Indication 11   newline 3 Vessel Disease  Low disease complexity (e.g., focal stenoses, SYNTAX <=22) A (7); Indication 17 A (8);  Indication 17   Intermediate or high disease complexity (e.g., SYNTAX >=23) M (6); Indication 21 A (8); Indication 21   newline Left Main Disease  Isolated LMCA disease: ostial or midshaft A (7); Indication 24 A (9); Indication 24   Isolated LMCA disease: bifurcation involvement M (5); Indication 25 A (9); Indication 25   LMCA ostial or midshaft, concurrent low disease burden multivessel disease (e.g., 1-2 additional focal stenoses, SYNTAX <=22) A (7); Indication 26 A (9); Indication 26   LMCA ostial or midshaft, concurrent intermediate or high disease burden multivessel disease (e.g., 1-2 additional bifurcation stenoses, long stenoses, SYNTAX >=23) M (4); Indication 27 A (9); Indication 27   LMCA bifurcation involvement, concurrent low disease burden multivessel disease (e.g., 1-2 additional focal stenoses, SYNTAX <=22) M (5); Indication 28 A (9); Indication 28   LMCA bifurcation involvement, concurrent intermediate or high disease burden multivessel disease (e.g., 1-2 additional bifurcation stenoses, long stenoses, SYNTAX >=23) R (3); Indication 29 A (9); Indication 29      Harbor Vanover J Magdeline Prange

## 2019-10-06 NOTE — H&P (Signed)
OV 9/16 copied for documentation.    Date:  10/01/2019  ID:  Donna [name], DOB 1927/09/06, MRN [id number]  PCP:  Kermit Balo, DO  Cardiologist:  Elder Negus, DO, FACC (established care 08/26/2019)  Date: 10/01/2019 Last Office Visit: 09/15/2019  No chief complaint on file.   HPI  Donna [name] is a 86 y.o. female who presents to the office with a chief complaint of " chest pain and elevated blood pressures. Patient's past medical history and cardiovascular risk factors include: hypertension, supraventricular tachycardia, moderate aortic stenosis, mild LVOT obstruction, severe MR, migraine, GI bleeding, postmenopausal female, advanced age.  Patient is accompanied by her daughter at today's visit.  She is referred to the office at the request of her primary care provider for evaluation and management palpitations.  She presents to the office sooner than scheduled because she has been experiencing symptoms of chest pain and palpitations.  Patient states that on September 16 she was taking clothes out of her dryer and with that she started having chest pressure in the substernal region, intensity 8 out of 10, bilateral arm heaviness, symptoms improved with resting.  Lasted for about 40 minutes in duration, also experienced shortness of breath with activity.  For reasons unknown she did not seek medical attention at the closest ER via EMS.  Patient states that her symptoms of lightheaded, dizziness have improved since last visit.  She has tolerated the initiation of losartan 50 mg p.o. every afternoon well as well as Cardizem.  She denies symptoms of near syncope syncope or heart failure.  Patient states that at home her blood pressures range between 140-160 mmHg and diastolic blood pressures range between 60-75 mmHg and heart rate greater than 60 bpm.  Patient has a known history of cardiac murmur.  She had an echocardiogram in outside facility more than 5 years ago I do not have the  results to review.  No history of rheumatic fever growing up.  Denies prior history of coronary artery disease, myocardial infarction, congestive heart failure, deep venous thrombosis, pulmonary embolism, stroke, transient ischemic attack.  FUNCTIONAL STATUS: No structured exercise program or daily routine. 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    ALLERGIES: No Known Allergies  MEDICATION LIST PRIOR TO VISIT: Current Meds  Medication Sig  . Calcium Carbonate-Vitamin D 600-400 MG-UNIT per tablet Take 2 tablets by mouth daily.  . Coenzyme Q10 (COQ10) 400 MG CAPS Take 400 mg by mouth daily.   Marland Kitchen diltiazem (CARDIZEM CD) 120 MG 24 hr capsule Take 1 capsule (120 mg total) by mouth in the morning.  Marland Kitchen losartan (COZAAR) 50 MG tablet Take 1 tablet (50 mg total) by mouth in the morning and at bedtime. (Patient taking differently: Take 50 mg by mouth daily. )  . Multiple Vitamins-Minerals (PRESERVISION AREDS 2) CAPS Take 1 capsule by mouth 2 (two) times a day.   Marland Kitchen OVER THE COUNTER MEDICATION Take 2 capsules by mouth daily. MitoQ  . SYNTHROID 100 MCG tablet TAKE 1 TABLET(100 MCG) BY MOUTH DAILY (Patient taking differently: Take 100 mcg by mouth daily before breakfast. )  . traMADol (ULTRAM) 50 MG tablet Take 0.5 tablets (25 mg total) by mouth daily as needed for severe pain.  . vitamin B-12 (CYANOCOBALAMIN) 1000 MCG tablet Take 1 tablet (1,000 mcg total) by mouth daily.     PAST MEDICAL HISTORY: Past Medical History:  Diagnosis Date  . Hemorrhage of gastrointestinal tract, unspecified   .  Hypertension   . Migraine   . OA (osteoarthritis)   . Thyroid cyst     PAST SURGICAL HISTORY: Past Surgical History:  Procedure Laterality Date  . CATARACT EXTRACTION  1990   rt  . CATARACT EXTRACTION  2005   left  . REVISION TOTAL KNEE ARTHROPLASTY  2008  . THUMB ARTHROSCOPY    . TONSILLECTOMY      FAMILY HISTORY: The patient family history includes Arthritis in her  sister; Brain cancer in her brother; CVA (age of onset: 68) in her sister; CVA (age of onset: 70) in her father; Heart disease in her sister; Pancreatic cancer in her mother.  SOCIAL HISTORY:  The patient  reports that she quit smoking about 71 years ago. Her smoking use included cigarettes. She quit after 2.00 years of use. She has never used smokeless tobacco. She reports previous alcohol use. She reports that she does not use drugs.  REVIEW OF SYSTEMS: Review of Systems  Constitutional: Negative for chills and fever.  HENT: Negative for hoarse voice and nosebleeds.   Eyes: Negative for discharge, double vision and pain.  Cardiovascular: Positive for chest pain. Negative for claudication, dyspnea on exertion, leg swelling, near-syncope, orthopnea, palpitations, paroxysmal nocturnal dyspnea and syncope.  Respiratory: Positive for shortness of breath. Negative for hemoptysis.   Musculoskeletal: Negative for muscle cramps and myalgias.  Gastrointestinal: Negative for abdominal pain, constipation, diarrhea, hematemesis, hematochezia, melena, nausea and vomiting.  Neurological: Positive for dizziness, light-headedness and vertigo (last episode was 1.5 year ago ).    PHYSICAL EXAM: Vitals with BMI 10/06/2019 10/01/2019 10/01/2019  Height 5\' 3"  5\' 3"  5\' 3"   Weight 155 lbs 157 lbs 156 lbs 6 oz  BMI 27.46 27.82 27.71  Systolic 172 182  Diastolic 57 63 64  Pulse 72 68 65   CONSTITUTIONAL: Well-developed and well-nourished. No acute distress.  SKIN: Skin is warm and dry. No rash noted. No cyanosis. No pallor. No jaundice HEAD: Normocephalic and atraumatic.  EYES: No scleral icterus MOUTH/THROAT: Moist oral membranes.  NECK: No JVD present. No thyromegaly noted.  Bilateral carotid bruits most likely secondary to delayed carotid upstrokes LYMPHATIC: No visible cervical adenopathy.  CHEST Normal respiratory effort. No intercostal retractions  LUNGS: Clear to auscultation bilaterally.  No  stridor. No wheezes. No rales.  CARDIOVASCULAR: Regular, positive S1 delayed S2, 3 out of 6 crescendo decrescendo murmur heard at the second right intercostal space, with a component of holosystolic murmur at the apex. ABDOMINAL: Soft, nontender, nondistended, no apparent ascites.  EXTREMITIES: Trace bilateral peripheral edema.  HEMATOLOGIC: No significant bruising NEUROLOGIC: Oriented to person, place, and time. Nonfocal. Normal muscle tone.  PSYCHIATRIC: Normal mood and affect. Normal behavior. Cooperative  CARDIAC DATABASE: EKG: 08/26/2019:Sinus  Rhythm, 67bpm, normal axis, PRWP, possible old inferior infarct, nonspecific ST-T changes in high lateral leads (I and avL).  Echocardiogram: 09/09/2019:  Left ventricle cavity is normal in size. Severe concentric hypertrophy of the left ventricle. Normal global wall motion. Normal LV systolic function with visual EF 50-55%. Doppler evidence of grade I (impaired) diastolic dysfunction, normal LAP.  Left atrial cavity is severely dilated.  Unable to clearly determine number of leaflets due to off axis view.  Moderate calcification. Mild aortic stenosis, in addition to mild LVOT obstruction. Aortic valve mean gradient of 18 mmHg, Vmax of 3.0 m/s. Calculated aortic valve area by continuity equation is 2 cm. Mild (grade I) aortic regurgitation. Moderate aortic stenosis. Moderate (Grade II) aortic regurgitation.  Severe (Grade III) mitral regurgitation.  Moderate tricuspid regurgitation. Estimated pulmonary artery systolic pressure 28 mmHg.   Stress Testing: None  Heart Catheterization: None  Carotid artery duplex 09/09/2019:  Minimal stenosis in the right internal carotid artery (minimal).  Minimal stenosis in the left internal carotid artery (minimal).  Minimal plaque noted in the CCA bilaterally without significant stenosis.  Antegrade right vertebral artery flow. Antegrade left vertebral artery flow.  Follow up studies if clinically  indicated.  7 day extended Holter monitor: Predominantly normal sinus rhythm (NSR).  HR 40-210 bpm.  Avg HR 79 bpm. Minimum HR 40 bpm on 08/29/19 at 1:02pm sinus with junctional escape. No atrial fibrillation/NSVT/pause (3 secs or longer). Total ventricular ectopic burden <1%. Total supraventricular ectopic burden <1%. 42 episodes of SVT.  Fastest and longest run was 1hr 17 mins with max HR 210 bpm (avg 155 bpm). Patient triggered events: 6.  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   LABORATORY DATA: CBC Latest Ref Rng & Units 10/02/2019 06/04/2019 04/02/2019  WBC 3.4 - 10.8 x10E3/uL 4.9 4.4 4.8  Hemoglobin 11.1 - 15.9 g/dL 40.913.4 81.114.1 91.414.3  Hematocrit 34.0 - 46.6 % 38.6 41.9 42.0  Platelets 150 - 450 x10E3/uL 150 175 167    CMP Latest Ref Rng & Units 06/04/2019 04/02/2019 12/08/2018  Glucose 65 - 99 mg/dL 99 95 86  BUN 7 - 25 mg/dL 15 17 21   Creatinine 0.60 - 0.88 mg/dL 7.82(N0.89(H) 5.620.88 1.300.86  Sodium 135 - 146 mmol/L 137 138 136  Potassium 3.5 - 5.3 mmol/L 5.0 4.5 4.8  Chloride 98 - 110 mmol/L 102 104 102  CO2 20 - 32 mmol/L 28 29 28   Calcium 8.6 - 10.4 mg/dL 86.510.3 78.410.2 69.610.0  Total Protein 6.1 - 8.1 g/dL 6.0(L) - 6.0(L)  Total Bilirubin 0.2 - 1.2 mg/dL 0.5 - 0.5  Alkaline Phos 33 - 130 U/L - - -  AST 10 - 35 U/L 14 - 15  ALT 6 - 29 U/L 18 - 17    Lipid Panel     Component Value Date/Time   CHOL 112 12/08/2018 1004   CHOL 118 08/04/2014 0806   TRIG 80 12/08/2018 1004   HDL 34 (L) 12/08/2018 1004   HDL 34 (L) 08/04/2014 0806   CHOLHDL 3.3 12/08/2018 1004   VLDL 15 08/22/2016 0910   LDLCALC 62 12/08/2018 1004   LABVLDL 15 08/04/2014 0806    No components found for: NTPROBNP No results for input(s): PROBNP in the last 8760 hours. Recent Labs    12/08/18 1004 06/04/19 0810  TSH 4.58* 3.69    BMP Recent Labs    12/08/18 1004 04/02/19 0821 06/04/19 0810  NA 136 138 137  K 4.8 4.5 5.0  CL 102 104 102  CO2 28  29 28   GLUCOSE 86 95 99  BUN 21 17 15   CREATININE 0.86 0.88 0.89*  CALCIUM 10.0 10.2 10.3  GFRNONAA 60  --  57*  GFRAA 70  --  67    HEMOGLOBIN A1C Lab Results  Component Value Date   HGBA1C 5.1 08/22/2016   MPG 100 08/22/2016    IMPRESSION:  No diagnosis found.   RECOMMENDATIONS: Donna CollegeSue Bell [name] is a 86 y.o. female whose past medical history and cardiac risk factors include: hypertension, supraventricular tachycardia, moderate aortic stenosis, mild LVOT obstruction, severe MR, migraine, GI bleeding, postmenopausal female, advanced age.  Angina pectoris:  Patient symptoms of chest discomfort are typical in nature; however, she is undergone an echo  and stress test recently results of which were reviewed with her and her daughter at today's office visit.  Her symptoms could be secondary to hemodynamics resulting from severe left ventricular hypertrophy, aortic stenosis, mitral regurgitation.  She also has palpitations and extended Holter monitor noted episodes of supraventricular tachycardia and possible underlying AVNRT.  Given her recent episode of chest pain, heaviness in bilateral extremities, symptoms brought on by effort and resolved with rest she is concerned about obstructive CAD despite normal stress test.  Shared decision was to proceed with left heart catheterization with possible intervention.  The procedure of left heart catheterization with possible intervention was explained to the patient in detail. The indication, alternatives, risks and benefits were reviewed. Complications include but not limited to bleeding, infection, vascular injury, stroke, myocardial infection, arrhythmia, kidney injury, radiation-related injury in the case of prolonged fluoroscopy use, emergency cardiac surgery, and death. The patient understands the risks of serious complication is 1-2 in 1000 with diagnostic cardiac cath and 1-2% or less with angioplasty/stenting.   I have discussed in  particular detail the possibility of acute renal failure after coronary angiography particularly if PCI is necessary.  I have described that it is possible patient may need short-term dialysis and the less likely may also require long-term dialysis depending on renal recovery.  I have asked for consent to proceed with coronary angiography understanding the risks of potentially needing dialysis, as well as the risks as stated above.  The patient and daughter voices understanding and provides verbal feedback and wishes to proceed with coronary angiography with possible PCI.  Aortic stenosis:  Patient is scheduled for another echocardiogram in February 2022 to reevaluate the severity and progression of aortic stenosis with [facility].  Patient would also benefit from a Langston's catheter to obtain hemodynamics during her upcoming left heart catheterization.  Discussed with interventional cardiology.  Patient is aware to seek medical attention if she has symptoms of chest pain, syncope, heart failure.  Severe MR:  Since her dizziness and lightheadedness is improved recommend uptitrating her antihypertensive medications.  Increase losartan to 50 mg p.o. twice daily.  Continue to monitor.  Supraventricular tachycardia:  Currently on Cardizem 120 mg p.o. daily  Symptoms of palpitations have improved.  We will consider cardiac electrophysiology consultation for reevaluation of her supraventricular tachycardia once ischemic evaluation is complete.  Patient is agreeable with the plan of care.  FINAL MEDICATION LIST END OF ENCOUNTER: Meds ordered this encounter  Medications  . sodium chloride flush (NS) 0.9 % injection 3 mL  . sodium chloride flush (NS) 0.9 % injection 3 mL  . 0.9 %  sodium chloride infusion  . aspirin chewable tablet 81 mg  . FOLLOWED BY Linked Order Group   . 0.9% sodium chloride infusion   . 0.9% sodium chloride infusion    There are no discontinued  medications.   Current Facility-Administered Medications:  .  0.9 %  sodium chloride infusion, 250 mL, Intravenous, PRN, Timberlyn Pickford J, MD .  0.9% sodium chloride infusion, 3 mL/kg/hr, Intravenous, Continuous **FOLLOWED BY** 0.9% sodium chloride infusion, 1 mL/kg/hr, Intravenous, Continuous, Nicholaos Schippers J, MD .  Melene Muller ON 10/07/2019] aspirin chewable tablet 81 mg, 81 mg, Oral, Pre-Cath, Bennette Hasty J, MD .  sodium chloride flush (NS) 0.9 % injection 3 mL, 3 mL, Intravenous, Q12H, Graciella Arment J, MD .  sodium chloride flush (NS) 0.9 % injection 3 mL, 3 mL, Intravenous, PRN, Kalena Mander J, MD  Orders Placed This Encounter  Procedures  .  Basic metabolic panel  . Diet NPO time specified  . Informed Consent Details: Physician/Practitioner Attestation; Transcribe to consent form and obtain patient signature  . Confirm CBC and BMP (or CMP) results within 7 days for inpatient and 30 days for outpatient: Outpatients with severe anemia (hgb<10, CKD, severe thrombocytopenia plts<100) labs should be within 10 days. Only draw PT/INR on patients that are on Coumadin, Hgb<10, have liver disease (cirrhosis, liver CA, hepatitis, etc). Urine pregnancy test within hospital admission for inpatients of child bearing age, for outpatients day of procedure.  . Confirm EKG performed within 30 days for cardiac procedures and 12 months for peripheral vascular procedures.  Place order for EKG if missing or not within timeframe.  . Verify aspirin and / or anti-platelet medication (Plavix, Effient, Brilinta) dose available for cardiac / peripheral vascular procedure day. IF ordered daily / once, adjust schedule to administer before procedure.  . Weigh patient  . Initiate Cath/PCI clinical path; encourage patient to watch CCTV video  . Clip R groin  . Clip R radial (no IV/bracelet R wrist)  . Insert peripheral IV  . Insert 2nd peripheral IV site-Saline lock IV    There are no outpatient  Patient Instructions on file for this admission.   --Continue cardiac medications as reconciled in final medication list. --No follow-ups on file. Or sooner if needed. --Continue follow-up with your primary care physician regarding the management of your other chronic comorbid conditions.  Patient's questions and concerns were addressed to her satisfaction. She voices understanding of the instructions provided during this encounter.   This note was created using a voice recognition software as a result there may be grammatical errors inadvertently enclosed that do not reflect the nature of this encounter. Every attempt is made to correct such errors.  Total time spent: 45 minutes reevaluating her symptoms as she came into the office sooner than her scheduled appointment.  Discussed the risks, benefits, alternatives to left heart catheterization with possible intervention.  Coordination of care.  Scheduling upcoming tests and procedures.  Tessa Lerner, [place], [facility]  Pager: [phone] Office: [phone]

## 2019-10-15 ENCOUNTER — Ambulatory Visit: Payer: Medicare PPO | Admitting: Cardiology

## 2019-10-20 ENCOUNTER — Ambulatory Visit: Payer: Medicare PPO | Admitting: Cardiology

## 2019-10-28 ENCOUNTER — Other Ambulatory Visit: Payer: Self-pay | Admitting: Internal Medicine

## 2019-10-28 ENCOUNTER — Other Ambulatory Visit: Payer: Self-pay

## 2019-10-28 ENCOUNTER — Encounter: Payer: Self-pay | Admitting: Nurse Practitioner

## 2019-10-28 ENCOUNTER — Other Ambulatory Visit: Payer: Self-pay | Admitting: *Deleted

## 2019-10-28 ENCOUNTER — Ambulatory Visit (INDEPENDENT_AMBULATORY_CARE_PROVIDER_SITE_OTHER): Payer: Medicare PPO | Admitting: Nurse Practitioner

## 2019-10-28 VITALS — BP 142/70 | HR 83 | Temp 96.8°F | Ht 63.0 in | Wt 155.4 lb

## 2019-10-28 DIAGNOSIS — E038 Other specified hypothyroidism: Secondary | ICD-10-CM

## 2019-10-28 DIAGNOSIS — R300 Dysuria: Secondary | ICD-10-CM | POA: Diagnosis not present

## 2019-10-28 DIAGNOSIS — I10 Essential (primary) hypertension: Secondary | ICD-10-CM

## 2019-10-28 DIAGNOSIS — E041 Nontoxic single thyroid nodule: Secondary | ICD-10-CM

## 2019-10-28 LAB — POCT URINALYSIS DIPSTICK
Bilirubin, UA: NEGATIVE
Glucose, UA: NEGATIVE
Ketones, UA: NEGATIVE
Nitrite, UA: POSITIVE
Protein, UA: POSITIVE — AB
Spec Grav, UA: 1.01 (ref 1.010–1.025)
Urobilinogen, UA: 0.2 E.U./dL
pH, UA: 7.5 (ref 5.0–8.0)

## 2019-10-28 MED ORDER — LEVOTHYROXINE SODIUM 100 MCG PO TABS: ORAL_TABLET | ORAL | 1 refills | Status: DC

## 2019-10-28 MED ORDER — LOSARTAN POTASSIUM 50 MG PO TABS: 50.0000 mg | ORAL_TABLET | Freq: Two times a day (BID) | ORAL | 1 refills | Status: DC

## 2019-10-28 MED ORDER — AMOXICILLIN-POT CLAVULANATE 875-125 MG PO TABS: 1.0000 | ORAL_TABLET | Freq: Two times a day (BID) | ORAL | 0 refills | Status: DC

## 2019-10-28 NOTE — Progress Notes (Signed)
Careteam: Patient Care Team: Gayland Curry, DO as PCP - General (Geriatric Medicine) [name], OD as Consulting Physician (Optometry) Caffie Pinto., MD as Referring Physician (Orthopedic Surgery)  PLACE OF SERVICE:  [place] Directive information    No Known Allergies  Chief Complaint  Patient presents with  . Acute Visit    Urinary frequency, pain and burning since Sunday night when urinating. Refill thyroid medication and losartan (90 day supply)     HPI: Patient is a 86 y.o. female here today for evaluation of burning with increase frequency of urination. Reports a tingling pain on urination. No abdominal pain but feels pressure.  No CVA tenderness.  No fevers or chills.  No diarrhea or bowel incontinence Does not have frequent UTIs.   Review of Systems:  Review of Systems  Constitutional: Negative for chills and fever.  Genitourinary: Positive for dysuria, frequency and urgency. Negative for flank pain and hematuria.    Past Medical History:  Diagnosis Date  . Hemorrhage of gastrointestinal tract, unspecified   . Hypertension   . Migraine   . OA (osteoarthritis)   . Thyroid cyst    Past Surgical History:  Procedure Laterality Date  . CATARACT EXTRACTION  1990   rt  . CATARACT EXTRACTION  2005   left  . LEFT HEART CATH AND CORONARY ANGIOGRAPHY N/A 10/06/2019   Procedure: LEFT HEART CATH AND CORONARY ANGIOGRAPHY;  Surgeon: Nigel Mormon, MD;  Location: [place] [facility];  Service: Cardiovascular;  Laterality: N/A;  . REVISION TOTAL KNEE ARTHROPLASTY  2008  . THUMB ARTHROSCOPY    . TONSILLECTOMY     Social History:   reports that she quit smoking about 71 years ago. Her smoking use included cigarettes. She quit after 2.00 years of use. She has never used smokeless tobacco. She reports previous alcohol use. She reports that she does not use drugs.  Family History  Problem Relation Age of Onset  . CVA Father 42  .  Pancreatic cancer Mother   . CVA Sister 82  . Brain cancer Brother   . Arthritis Sister   . Heart disease Sister   . Breast cancer Neg Hx     Medications: Patient's Medications  New Prescriptions   AMOXICILLIN-CLAVULANATE (AUGMENTIN) 875-125 MG TABLET    Take 1 tablet by mouth 2 (two) times daily.  Previous Medications   BLOOD PRESSURE MONITORING KIT    Essential hypertension check bp daily   CALCIUM CARBONATE-VITAMIN D 600-400 MG-UNIT PER TABLET    Take 2 tablets by mouth daily.   COENZYME Q10 (COQ10) 400 MG CAPS    Take 400 mg by mouth daily.    DILTIAZEM (CARDIZEM CD) 120 MG 24 HR CAPSULE    Take 1 capsule (120 mg total) by mouth in the morning.   LOSARTAN (COZAAR) 50 MG TABLET    Take 1 tablet (50 mg total) by mouth in the morning and at bedtime.   MULTIPLE VITAMINS-MINERALS (PRESERVISION AREDS 2) CAPS    Take 1 capsule by mouth 2 (two) times a day.    OVER THE COUNTER MEDICATION    Take 2 capsules by mouth daily. MitoQ   SYNTHROID 100 MCG TABLET    TAKE 1 TABLET(100 MCG) BY MOUTH DAILY   TRAMADOL (ULTRAM) 50 MG TABLET    Take 0.5 tablets (25 mg total) by mouth daily as needed for severe pain.   VITAMIN B-12 (CYANOCOBALAMIN) 1000 MCG TABLET    Take 1 tablet (  1,000 mcg total) by mouth daily.  Modified Medications   No medications on file  Discontinued Medications   No medications on file    Physical Exam:  Vitals:   10/28/19 1418  BP: (!) 142/70  Pulse: 83  Temp: (!) 96.8 F (36 C)  TempSrc: Temporal  Weight: 155 lb 6.4 oz (70.5 kg)  Height: 5' 3"  (1.6 m)   Body mass index is 27.53 kg/m. Wt Readings from Last 3 Encounters:  10/28/19 155 lb 6.4 oz (70.5 kg)  10/06/19 155 lb (70.3 kg)  10/01/19 157 lb (71.2 kg)    Physical Exam Constitutional:      Appearance: Normal appearance.  Abdominal:     General: Abdomen is flat. Bowel sounds are normal. There is no distension.     Palpations: Abdomen is soft.     Tenderness: There is no abdominal tenderness. There is no  right CVA tenderness, left CVA tenderness or guarding.  Skin:    General: Skin is warm and dry.  Neurological:     Mental Status: She is alert and oriented to person, place, and time.     Labs reviewed: Basic Metabolic Panel: Recent Labs    12/08/18 1004 12/08/18 1004 04/02/19 0821 06/04/19 0810 10/06/19 1303  NA 136   < > 138 137 136  K 4.8   < > 4.5 5.0 4.9  CL 102   < > 104 102 103  CO2 28   < > 29 28 24   GLUCOSE 86   < > 95 99 91  BUN 21   < > 17 15 13   CREATININE 0.86   < > 0.88 0.89* 0.74  CALCIUM 10.0   < > 10.2 10.3 9.5  TSH 4.58*  --   --  3.69  --    < > = values in this interval not displayed.   Liver Function Tests: Recent Labs    12/08/18 1004 06/04/19 0810  AST 15 14  ALT 17 18  BILITOT 0.5 0.5  PROT 6.0* 6.0*   No results for input(s): LIPASE, AMYLASE in the last 8760 hours. No results for input(s): AMMONIA in the last 8760 hours. CBC: Recent Labs    12/08/18 1004 12/08/18 1004 04/02/19 0821 06/04/19 0810 10/02/19 1158  WBC 5.2   < > 4.8 4.4 4.9  NEUTROABS 3,442  --  2,918 2,803  --   HGB 13.7   < > 14.3 14.1 13.4  HCT 39.7   < > 42.0 41.9 38.6  MCV 88.8   < > 89.6 88.2 87  PLT 181   < > 167 175 150   < > = values in this interval not displayed.   Lipid Panel: Recent Labs    12/08/18 1004  CHOL 112  HDL 34*  LDLCALC 62  TRIG 80  CHOLHDL 3.3   TSH: Recent Labs    12/08/18 1004 06/04/19 0810  TSH 4.58* 3.69   A1C: Lab Results  Component Value Date   HGBA1C 5.1 08/22/2016     Assessment/Plan 1. Dysuria - POC Urinalysis Dipstick- abnormal - Urine Culture sent - amoxicillin-clavulanate (AUGMENTIN) 875-125 MG tablet; Take 1 tablet by mouth 2 (two) times daily.  Dispense: 14 tablet; Refill: 0 -to take with food twice daily and with full glass of water -add probiotic twice daily while on antibiotic  -increase hydration   Next appt: 11/12/2019 Carlos American. Ramos, [place] [facility] [id number]

## 2019-10-28 NOTE — Telephone Encounter (Signed)
Pharmacy sent refill request and patient stated that the pharmacy told her that the Rx's were refused and for her to call the office because she had an appointment today to discuss.   Patient is confused because she stated that she was just seen today.  Pended Rx's and sent to [facility] for approval.

## 2019-10-28 NOTE — Patient Instructions (Addendum)
To take augmentin twice daily with full glass of water To add yogurt daily also can add florastor (probiotic) twice daily   Increase hydration.

## 2019-10-30 LAB — URINE CULTURE
MICRO NUMBER:: 11068970
SPECIMEN QUALITY:: ADEQUATE

## 2019-11-03 ENCOUNTER — Other Ambulatory Visit: Payer: Self-pay

## 2019-11-03 ENCOUNTER — Encounter: Payer: Self-pay | Admitting: Cardiology

## 2019-11-03 ENCOUNTER — Ambulatory Visit: Payer: Medicare PPO | Admitting: Cardiology

## 2019-11-03 VITALS — BP 168/72 | HR 68 | Resp 18 | Ht 63.0 in | Wt 155.0 lb

## 2019-11-03 DIAGNOSIS — Z87891 Personal history of nicotine dependence: Secondary | ICD-10-CM | POA: Diagnosis not present

## 2019-11-03 DIAGNOSIS — I351 Nonrheumatic aortic (valve) insufficiency: Secondary | ICD-10-CM

## 2019-11-03 DIAGNOSIS — I471 Supraventricular tachycardia, unspecified: Secondary | ICD-10-CM

## 2019-11-03 DIAGNOSIS — I10 Essential (primary) hypertension: Secondary | ICD-10-CM

## 2019-11-03 DIAGNOSIS — I080 Rheumatic disorders of both mitral and aortic valves: Secondary | ICD-10-CM

## 2019-11-03 DIAGNOSIS — R002 Palpitations: Secondary | ICD-10-CM

## 2019-11-03 DIAGNOSIS — Z712 Person consulting for explanation of examination or test findings: Secondary | ICD-10-CM | POA: Diagnosis not present

## 2019-11-03 MED ORDER — AMLODIPINE BESYLATE 5 MG PO TABS: 5.0000 mg | ORAL_TABLET | Freq: Every evening | ORAL | 0 refills | Status: DC

## 2019-11-03 MED ORDER — DILTIAZEM HCL ER COATED BEADS 240 MG PO CP24: 240.0000 mg | ORAL_CAPSULE | Freq: Every morning | ORAL | 0 refills | Status: DC

## 2019-11-03 NOTE — Progress Notes (Signed)
Date:  10/01/2019  ID:  Donna [name], DOB 04-27-1927, MRN [id number]  PCP:  Gayland Curry, DO  Cardiologist:  Rex Kras, DO, [facility] (established care 08/26/2019)  Date: 11/03/19 Last Office Visit: 10/01/2019  Chief Complaint  Patient presents with  . Follow-up    Post heart cath    HPI  Donna [name] is a 86 y.o. female who presents to the office with a chief complaint of " reevaluation of  palpitation and review test results.." Patient's past medical history and cardiovascular risk factors include: hypertension, supraventricular tachycardia, moderate aortic stenosis, mild LVOT obstruction, severe MR, migraine, GI bleeding, postmenopausal female, advanced age.  Patient is accompanied by her daughter at today's visit.  She is referred to the office at the request of her primary care provider for evaluation and management palpitations.  At last office visit patient was having symptoms of angina pectoris he decided to proceed with left heart catheterization with possible PCI.  Since then patient did undergo invasive angiography and was found to have no significant epicardial coronary artery disease.  No significant gradient on pullback.  But she did have 60 mmHg mid cavitary gradient of pullback.  Since establishing care with myself patient's medications have been uptitrated.  She is tolerating the initiation of diltiazem and up titration of losartan.  Since last office visit patient states that she no longer has symptoms palpitations or chest pain.  She continues to have shortness of breath mostly with cough related to wheeze.  She denies syncope, congestive heart failure symptoms.  Reviewed her blood pressure log and her systolic blood pressures are consistently improving.  Denies prior history of coronary artery disease, myocardial infarction, congestive heart failure, deep venous thrombosis, pulmonary embolism, stroke, transient ischemic attack.  FUNCTIONAL STATUS: No structured  exercise program or daily routine. 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    ALLERGIES: No Known Allergies  MEDICATION LIST PRIOR TO VISIT: Current Meds  Medication Sig  . amoxicillin-clavulanate (AUGMENTIN) 875-125 MG tablet Take 1 tablet by mouth 2 (two) times daily.  . Blood Pressure Monitoring KIT Essential hypertension check bp daily  . Calcium Carbonate-Vitamin D 600-400 MG-UNIT per tablet Take 2 tablets by mouth daily.  . Coenzyme Q10 (COQ10) 400 MG CAPS Take 400 mg by mouth daily.   Marland Kitchen levothyroxine (SYNTHROID) 100 MCG tablet Take one tablet by mouth once daily 30 minutes before breakfast on empty stomach  . losartan (COZAAR) 50 MG tablet Take 1 tablet (50 mg total) by mouth in the morning and at bedtime.  . Multiple Vitamins-Minerals (PRESERVISION AREDS 2) CAPS Take 1 capsule by mouth 2 (two) times a day.   Marland Kitchen OVER THE COUNTER MEDICATION Take 2 capsules by mouth daily. MitoQ  . traMADol (ULTRAM) 50 MG tablet Take 0.5 tablets (25 mg total) by mouth daily as needed for severe pain.  . vitamin B-12 (CYANOCOBALAMIN) 1000 MCG tablet Take 1 tablet (1,000 mcg total) by mouth daily.  . [DISCONTINUED] diltiazem (CARDIZEM CD) 120 MG 24 hr capsule Take 1 capsule (120 mg total) by mouth in the morning.     PAST MEDICAL HISTORY: Past Medical History:  Diagnosis Date  . Aortic stenosis   . Hemorrhage of gastrointestinal tract, unspecified   . Hypertension   . Migraine   . Mitral regurgitation   . OA (osteoarthritis)   . Thyroid cyst     PAST SURGICAL HISTORY: Past Surgical History:  Procedure Laterality Date  . CATARACT  EXTRACTION  1990   rt  . CATARACT EXTRACTION  2005   left  . LEFT HEART CATH AND CORONARY ANGIOGRAPHY N/A 10/06/2019   Procedure: LEFT HEART CATH AND CORONARY ANGIOGRAPHY;  Surgeon: Nigel Mormon, MD;  Location: [place] [facility];  Service: Cardiovascular;  Laterality: N/A;  . REVISION TOTAL KNEE ARTHROPLASTY  2008  . THUMB  ARTHROSCOPY    . TONSILLECTOMY      FAMILY HISTORY: The patient family history includes Arthritis in her sister; Brain cancer in her brother; CVA (age of onset: 65) in her sister; CVA (age of onset: 53) in her father; Heart disease in her sister; Pancreatic cancer in her mother.  SOCIAL HISTORY:  The patient  reports that she quit smoking about 71 years ago. Her smoking use included cigarettes. She quit after 2.00 years of use. She has never used smokeless tobacco. She reports previous alcohol use. She reports that she does not use drugs.  REVIEW OF SYSTEMS: Review of Systems  Constitutional: Negative for chills and fever.  HENT: Negative for hoarse voice and nosebleeds.   Eyes: Negative for discharge, double vision and pain.  Cardiovascular: Negative for chest pain, claudication, dyspnea on exertion, leg swelling, near-syncope, orthopnea, palpitations, paroxysmal nocturnal dyspnea and syncope.  Respiratory: Positive for shortness of breath. Negative for hemoptysis.   Musculoskeletal: Negative for muscle cramps and myalgias.  Gastrointestinal: Negative for abdominal pain, constipation, diarrhea, hematemesis, hematochezia, melena, nausea and vomiting.  Neurological: Positive for vertigo (last episode was 1.5 year ago ). Negative for dizziness and light-headedness.    PHYSICAL EXAM: Vitals with BMI 11/03/2019 10/28/2019 10/06/2019  Height 5' 3" 5' 3" -  Weight 155 lbs 155 lbs 6 oz -  BMI 55.97 41.63 -  Systolic 845 364 680  Diastolic 72 70 57  Pulse 68 83 76   CONSTITUTIONAL: Well-developed and well-nourished. No acute distress.  SKIN: Skin is warm and dry. No rash noted. No cyanosis. No pallor. No jaundice HEAD: Normocephalic and atraumatic.  EYES: No scleral icterus MOUTH/THROAT: Moist oral membranes.  NECK: No JVD present. No thyromegaly noted.  Bilateral carotid bruits most likely secondary to delayed carotid upstrokes LYMPHATIC: No visible cervical adenopathy.  CHEST Normal  respiratory effort. No intercostal retractions  LUNGS: Clear to auscultation bilaterally.  No stridor. No wheezes. No rales.  CARDIOVASCULAR: Regular, positive S1 delayed S2, 3 out of 6 crescendo decrescendo murmur heard at the second right intercostal space, with a component of holosystolic murmur at the apex. ABDOMINAL: Soft, nontender, nondistended, no apparent ascites.  EXTREMITIES: Trace bilateral peripheral edema.  HEMATOLOGIC: No significant bruising NEUROLOGIC: Oriented to person, place, and time. Nonfocal. Normal muscle tone.  PSYCHIATRIC: Normal mood and affect. Normal behavior. Cooperative  CARDIAC DATABASE: EKG: 08/26/2019:Sinus  Rhythm, 67bpm, normal axis, PRWP, possible old inferior infarct, nonspecific ST-T changes in high lateral leads (I and avL).  Echocardiogram: 09/09/2019:  Left ventricle cavity is normal in size. Severe concentric hypertrophy of the left ventricle. Normal global wall motion. Normal LV systolic function with visual EF 50-55%. Doppler evidence of grade I (impaired) diastolic dysfunction, normal LAP.  Left atrial cavity is severely dilated.  Unable to clearly determine number of leaflets due to off axis view.  Moderate calcification. Mild aortic stenosis, in addition to mild LVOT obstruction. Aortic valve mean gradient of 18 mmHg, Vmax of 3.0 m/s. Calculated aortic valve area by continuity equation is 2 cm. Mild (grade I) aortic regurgitation. Moderate aortic stenosis. Moderate (Grade II) aortic regurgitation.  Severe (Grade III)  mitral regurgitation.  Moderate tricuspid regurgitation. Estimated pulmonary artery systolic pressure 28 mmHg.   Stress Testing: None  Heart Catheterization: 10/06/2019: LM: Normal LAD: Mild luminal irregularities LCx: Normal RCA: Normal  LVEDP 14 mmHg 60 mmHg mid-cavitary gradient on pullback No significant LV-Ao gradient on pullback  Carotid artery duplex 09/09/2019:  Minimal stenosis in the right internal carotid  artery (minimal).  Minimal stenosis in the left internal carotid artery (minimal).  Minimal plaque noted in the CCA bilaterally without significant stenosis.  Antegrade right vertebral artery flow. Antegrade left vertebral artery flow.  Follow up studies if clinically indicated.  7 day extended Holter monitor: Predominantly normal sinus rhythm (NSR).  HR 40-210 bpm.  Avg HR 79 bpm. Minimum HR 40 bpm on 08/29/19 at 1:02pm sinus with junctional escape. No atrial fibrillation/NSVT/pause (3 secs or longer). Total ventricular ectopic burden <1%. Total supraventricular ectopic burden <1%. 42 episodes of SVT.  Fastest and longest run was 1hr 17 mins with max HR 210 bpm (avg 155 bpm). Patient triggered events: 6.  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   LABORATORY DATA: CBC Latest Ref Rng & Units 10/02/2019 06/04/2019 04/02/2019  WBC 3.4 - 10.8 x10E3/uL 4.9 4.4 4.8  Hemoglobin 11.1 - 15.9 g/dL 13.4 14.1 14.3  Hematocrit 34.0 - 46.6 % 38.6 41.9 42.0  Platelets 150 - 450 x10E3/uL 150 175 167    CMP Latest Ref Rng & Units 10/06/2019 06/04/2019 04/02/2019  Glucose 70 - 99 mg/dL 91 99 95  BUN 8 - 23 mg/dL _0 Creatinine 0.44 - 1.00 mg/dL 0.74 0.89(H) 0.88  Sodium 135 - 145 mmol/L 136 137 138  Potassium 3.5 - 5.1 mmol/L 4.9 5.0 4.5  Chloride 98 - 111 mmol/L 103 102 104  CO2 22 - 32 mmol/L _1 Calcium 8.9 - 10.3 mg/dL 9.5 10.3 10.2  Total Protein 6.1 - 8.1 g/dL - 6.0(L) -  Total Bilirubin 0.2 - 1.2 mg/dL - 0.5 -  Alkaline Phos 33 - 130 U/L - - -  AST 10 - 35 U/L - 14 -  ALT 6 - 29 U/L - 18 -    Lipid Panel     Component Value Date/Time   CHOL 112 12/08/2018 1004   CHOL 118 08/04/2014 0806   TRIG 80 12/08/2018 1004   HDL 34 (L) 12/08/2018 1004   HDL 34 (L) 08/04/2014 0806   CHOLHDL 3.3 12/08/2018 1004   VLDL 15 08/22/2016 0910   LDLCALC 62 12/08/2018 1004   LABVLDL 15 08/04/2014 0806    No components found  for: NTPROBNP No results for input(s): PROBNP in the last 8760 hours. Recent Labs    12/08/18 1004 06/04/19 0810  TSH 4.58* 3.69    BMP Recent Labs    12/08/18 1004 12/08/18 1004 04/02/19 0821 06/04/19 0810 10/06/19 1303  NA 136   < > 138 137 136  K 4.8   < > 4.5 5.0 4.9  CL 102   < > 104 102 103  CO2 28   < > _2 GLUCOSE 86   < > 95 99 91  BUN 21   < > _3 CREATININE 0.86   < > 0.88 0.89* 0.74  CALCIUM 10.0   < > 10.2 10.3 9.5  GFRNONAA 60  --   --  57* >60  GFRAA 70  --   --  67 >60   < > =  values in this interval not displayed.    HEMOGLOBIN A1C Lab Results  Component Value Date   HGBA1C 5.1 08/22/2016   MPG 100 08/22/2016    IMPRESSION:    ICD-10-CM   1. Palpitations  R00.2 diltiazem (CARDIZEM CD) 240 MG 24 hr capsule  2. Paroxysmal SVT (supraventricular tachycardia) (HCC)  I47.1   3. Essential hypertension, benign  I10 amLODipine (NORVASC) 5 MG tablet  4. Mitral regurgitation and aortic stenosis  I08.0   5. Nonrheumatic aortic valve insufficiency  I35.1   6. Former smoker  Z87.891   92. Encounter to discuss test results  Z71.2      RECOMMENDATIONS: Donna [name] is a 86 y.o. female whose past medical history and cardiac risk factors include: hypertension, supraventricular tachycardia, moderate aortic stenosis, mild LVOT obstruction, severe MR, migraine, GI bleeding, postmenopausal female, advanced age.  Palpitations: Improving Given the mid cavitary gradient by invasive angiography, palpitations, and PSVT Will uptitrate Cardizem to 240 mg p.o. daily.  Patient is recommended to hold Cardizem for systolic blood pressure less than 100 mmHg or heart rate less than 60 bpm.  Supraventricular tachycardia:  Increase Cardizem 240 mg p.o. daily  Symptoms of palpitations have improved.  Patient would like to hold off on further evaluation by cardiac electrophysiology given time as her symptoms of palpitations proved since initiation of Cardizem.  We  will continue to monitor.  Aortic stenosis:  Patient is scheduled for another echocardiogram in February 2022 to reevaluate the severity and progression of aortic stenosis at [facility].  No significant LV-AO gradient on pullback via invasive angiography  Patient is aware to seek medical attention if she has symptoms of chest pain, syncope, heart failure.  Severe MR:  Initiate Norvasc 5 mg p.o. p.m.  Continue losartan to 50 mg p.o. twice daily.  Continue to monitor.  Independently reviewed the left heart cath images, discussed disease management, up titration of pharmacological therapy which provided prescriptions as well, coordination of care.  FINAL MEDICATION LIST END OF ENCOUNTER: Meds ordered this encounter  Medications  . diltiazem (CARDIZEM CD) 240 MG 24 hr capsule    Sig: Take 1 capsule (240 mg total) by mouth in the morning.    Dispense:  90 capsule    Refill:  0  . amLODipine (NORVASC) 5 MG tablet    Sig: Take 1 tablet (5 mg total) by mouth every evening.    Dispense:  90 tablet    Refill:  0    Medications Discontinued During This Encounter  Medication Reason  . diltiazem (CARDIZEM CD) 120 MG 24 hr capsule Dose change     Current Outpatient Medications:  .  amoxicillin-clavulanate (AUGMENTIN) 875-125 MG tablet, Take 1 tablet by mouth 2 (two) times daily., Disp: 14 tablet, Rfl: 0 .  Blood Pressure Monitoring KIT, Essential hypertension check bp daily, Disp: 1 kit, Rfl: 0 .  Calcium Carbonate-Vitamin D 600-400 MG-UNIT per tablet, Take 2 tablets by mouth daily., Disp: , Rfl:  .  Coenzyme Q10 (COQ10) 400 MG CAPS, Take 400 mg by mouth daily. , Disp: , Rfl:  .  levothyroxine (SYNTHROID) 100 MCG tablet, Take one tablet by mouth once daily 30 minutes before breakfast on empty stomach, Disp: 90 tablet, Rfl: 1 .  losartan (COZAAR) 50 MG tablet, Take 1 tablet (50 mg total) by mouth in the morning and at bedtime., Disp: 180 tablet, Rfl: 1 .  Multiple  Vitamins-Minerals (PRESERVISION AREDS 2) CAPS, Take 1 capsule by mouth 2 (two) times  a day. , Disp: , Rfl:  .  OVER THE COUNTER MEDICATION, Take 2 capsules by mouth daily. MitoQ, Disp: , Rfl:  .  traMADol (ULTRAM) 50 MG tablet, Take 0.5 tablets (25 mg total) by mouth daily as needed for severe pain., Disp: 15 tablet, Rfl: 0 .  vitamin B-12 (CYANOCOBALAMIN) 1000 MCG tablet, Take 1 tablet (1,000 mcg total) by mouth daily., Disp: 30 tablet, Rfl: 5 .  amLODipine (NORVASC) 5 MG tablet, Take 1 tablet (5 mg total) by mouth every evening., Disp: 90 tablet, Rfl: 0 .  diltiazem (CARDIZEM CD) 240 MG 24 hr capsule, Take 1 capsule (240 mg total) by mouth in the morning., Disp: 90 capsule, Rfl: 0  No orders of the defined types were placed in this encounter.   There are no Patient Instructions on file for this visit.   --Continue cardiac medications as reconciled in final medication list. --Return in about 6 weeks (around 12/15/2019) for Follow up, BP, palpitations. Or sooner if needed. --Continue follow-up with your primary care physician regarding the management of your other chronic comorbid conditions.  Patient's questions and concerns were addressed to her satisfaction. She voices understanding of the instructions provided during this encounter.   This note was created using a voice recognition software as a result there may be grammatical errors inadvertently enclosed that do not reflect the nature of this encounter. Every attempt is made to correct such errors.  Total time spent: 45 minutes reevaluating her symptoms as she came into the office sooner than her scheduled appointment.  Discussed the risks, benefits, alternatives to left heart catheterization with possible intervention.  Coordination of care.  Scheduling upcoming tests and procedures.  Rex Kras, [place], [facility]  Pager: [phone] Office: [phone]

## 2019-11-11 ENCOUNTER — Other Ambulatory Visit: Payer: Self-pay

## 2019-11-11 ENCOUNTER — Telehealth: Payer: Self-pay

## 2019-11-11 NOTE — Telephone Encounter (Signed)
Received a call from patient regarding heart palpitations. Patient states her heart feels like it is beating "hard" as the day goes on. Nothing in particular seems to start these events. Palpitations have been occurring for a few days. Patient does not report any other symptoms. Please advise. Thanks!

## 2019-11-11 NOTE — Telephone Encounter (Signed)
Please make sure that she keep herself well hydrated and she can take an extra half tablet of her daily cardizem 240mg  po qday.

## 2019-11-11 NOTE — Telephone Encounter (Signed)
Spoke with patient regarding importance of hydration and to increase Cardizem. Patient voiced understanding and will cb in a few days to give update on symptoms.

## 2019-11-12 ENCOUNTER — Encounter: Payer: Self-pay | Admitting: Internal Medicine

## 2019-11-12 ENCOUNTER — Ambulatory Visit (INDEPENDENT_AMBULATORY_CARE_PROVIDER_SITE_OTHER): Payer: Medicare PPO | Admitting: Internal Medicine

## 2019-11-12 ENCOUNTER — Telehealth: Payer: Self-pay | Admitting: Cardiology

## 2019-11-12 ENCOUNTER — Inpatient Hospital Stay (HOSPITAL_COMMUNITY)
Admission: EM | Admit: 2019-11-12 | Discharge: 2019-11-14 | DRG: 308 | Disposition: A | Discharge status: Home / Self Care | Payer: Medicare PPO | Attending: Family Medicine | Admitting: Family Medicine

## 2019-11-12 ENCOUNTER — Other Ambulatory Visit: Payer: Self-pay

## 2019-11-12 VITALS — BP 124/64 | HR 67 | Temp 97.1°F | Ht 63.0 in | Wt 158.0 lb

## 2019-11-12 DIAGNOSIS — I471 Supraventricular tachycardia, unspecified: Secondary | ICD-10-CM | POA: Insufficient documentation

## 2019-11-12 DIAGNOSIS — I080 Rheumatic disorders of both mitral and aortic valves: Secondary | ICD-10-CM | POA: Diagnosis present

## 2019-11-12 DIAGNOSIS — M1990 Unspecified osteoarthritis, unspecified site: Secondary | ICD-10-CM | POA: Diagnosis present

## 2019-11-12 DIAGNOSIS — I10 Essential (primary) hypertension: Secondary | ICD-10-CM | POA: Diagnosis present

## 2019-11-12 DIAGNOSIS — I5031 Acute diastolic (congestive) heart failure: Secondary | ICD-10-CM | POA: Diagnosis not present

## 2019-11-12 DIAGNOSIS — R0602 Shortness of breath: Secondary | ICD-10-CM | POA: Diagnosis present

## 2019-11-12 DIAGNOSIS — N179 Acute kidney failure, unspecified: Secondary | ICD-10-CM

## 2019-11-12 DIAGNOSIS — R0600 Dyspnea, unspecified: Secondary | ICD-10-CM | POA: Diagnosis not present

## 2019-11-12 DIAGNOSIS — G43909 Migraine, unspecified, not intractable, without status migrainosus: Secondary | ICD-10-CM | POA: Diagnosis present

## 2019-11-12 DIAGNOSIS — E875 Hyperkalemia: Secondary | ICD-10-CM | POA: Diagnosis present

## 2019-11-12 DIAGNOSIS — E039 Hypothyroidism, unspecified: Secondary | ICD-10-CM

## 2019-11-12 DIAGNOSIS — Z8249 Family history of ischemic heart disease and other diseases of the circulatory system: Secondary | ICD-10-CM | POA: Diagnosis not present

## 2019-11-12 DIAGNOSIS — R001 Bradycardia, unspecified: Secondary | ICD-10-CM | POA: Diagnosis present

## 2019-11-12 DIAGNOSIS — R0609 Other forms of dyspnea: Secondary | ICD-10-CM

## 2019-11-12 DIAGNOSIS — Z800 Family history of malignant neoplasm of digestive organs: Secondary | ICD-10-CM | POA: Diagnosis not present

## 2019-11-12 DIAGNOSIS — Z79899 Other long term (current) drug therapy: Secondary | ICD-10-CM

## 2019-11-12 DIAGNOSIS — J811 Chronic pulmonary edema: Secondary | ICD-10-CM | POA: Diagnosis not present

## 2019-11-12 DIAGNOSIS — Z8261 Family history of arthritis: Secondary | ICD-10-CM | POA: Diagnosis not present

## 2019-11-12 DIAGNOSIS — Z808 Family history of malignant neoplasm of other organs or systems: Secondary | ICD-10-CM

## 2019-11-12 DIAGNOSIS — I340 Nonrheumatic mitral (valve) insufficiency: Secondary | ICD-10-CM | POA: Diagnosis present

## 2019-11-12 DIAGNOSIS — Z20822 Contact with and (suspected) exposure to covid-19: Secondary | ICD-10-CM | POA: Diagnosis present

## 2019-11-12 DIAGNOSIS — Z87440 Personal history of urinary (tract) infections: Secondary | ICD-10-CM

## 2019-11-12 DIAGNOSIS — I498 Other specified cardiac arrhythmias: Secondary | ICD-10-CM | POA: Diagnosis not present

## 2019-11-12 DIAGNOSIS — R002 Palpitations: Secondary | ICD-10-CM | POA: Diagnosis not present

## 2019-11-12 DIAGNOSIS — Z87891 Personal history of nicotine dependence: Secondary | ICD-10-CM | POA: Diagnosis not present

## 2019-11-12 DIAGNOSIS — Z79890 Hormone replacement therapy (postmenopausal): Secondary | ICD-10-CM

## 2019-11-12 DIAGNOSIS — E871 Hypo-osmolality and hyponatremia: Secondary | ICD-10-CM | POA: Diagnosis present

## 2019-11-12 DIAGNOSIS — E8770 Fluid overload, unspecified: Secondary | ICD-10-CM

## 2019-11-12 DIAGNOSIS — I517 Cardiomegaly: Secondary | ICD-10-CM | POA: Diagnosis not present

## 2019-11-12 DIAGNOSIS — Z823 Family history of stroke: Secondary | ICD-10-CM

## 2019-11-12 DIAGNOSIS — K922 Gastrointestinal hemorrhage, unspecified: Secondary | ICD-10-CM | POA: Diagnosis present

## 2019-11-12 DIAGNOSIS — I110 Hypertensive heart disease with heart failure: Secondary | ICD-10-CM | POA: Diagnosis present

## 2019-11-12 DIAGNOSIS — I350 Nonrheumatic aortic (valve) stenosis: Secondary | ICD-10-CM | POA: Diagnosis not present

## 2019-11-12 DIAGNOSIS — Z96659 Presence of unspecified artificial knee joint: Secondary | ICD-10-CM | POA: Diagnosis present

## 2019-11-12 DIAGNOSIS — T461X5A Adverse effect of calcium-channel blockers, initial encounter: Secondary | ICD-10-CM | POA: Diagnosis not present

## 2019-11-12 DIAGNOSIS — Z66 Do not resuscitate: Secondary | ICD-10-CM | POA: Diagnosis present

## 2019-11-12 NOTE — Progress Notes (Signed)
Location:  [facility] clinic Provider:  Sheridan Hew L. Mariea Clonts, D.O., C.M.D.  Code Status: DNR Goals of Care:  Advanced Directives 11/12/2019  Does Patient Have a Medical Advance Directive? Yes  Type of Paramedic of [place];Out of facility DNR (pink MOST or yellow form)  Does patient want to make changes to medical advance directive? No - Patient declined  Copy of [place] in Chart? -  Pre-existing out of facility DNR order (yellow form or pink MOST form) -     Chief Complaint  Patient presents with  . Medical Management of Chronic Issues    6 week follow up   . Acute Visit    no energy, SOB, heart palpatations took an extra cardizem yesterday and this morning per her cardiologist. Feels queasy x 1 week. check weight on Monday it was 153.8 and then on 11/12/2019 it was 158lbs   . Health Maintenance    Flu shot at [facility]     HPI: Patient is a 86 y.o. female seen today for medical management of chronic diseases.    She had been doing better with her dizziness last week.  This week, she feels bad and has hardly been able to do anything.  She's felt her heart pounding a lot.  She took an extra cardizem yesterday at Dr. Brennan Bailey instruction. Last night she took her bp (takes bid), the heart thing showed it as irregular when that was not noted before.   She took another extra this morning.  She is getting out of breath walking one end of the hall to the next which was not the case before.  She has no energy reserves for several months.  Can only get to one store.  She's also felt queasy all week.  She completed abx for UTI.  That is resolved. She is anxious b/c she's usually been so healthy.    Past Medical History:  Diagnosis Date  . Aortic stenosis   . Hemorrhage of gastrointestinal tract, unspecified   . Hypertension   . Migraine   . Mitral regurgitation   . OA (osteoarthritis)   . Thyroid cyst     Past Surgical History:  Procedure  Laterality Date  . CATARACT EXTRACTION  1990   rt  . CATARACT EXTRACTION  2005   left  . LEFT HEART CATH AND CORONARY ANGIOGRAPHY N/A 10/06/2019   Procedure: LEFT HEART CATH AND CORONARY ANGIOGRAPHY;  Surgeon: Nigel Mormon, MD;  Location: [facility];  Service: Cardiovascular;  Laterality: N/A;  . REVISION TOTAL KNEE ARTHROPLASTY  2008  . THUMB ARTHROSCOPY    . TONSILLECTOMY      No Known Allergies  Outpatient Encounter Medications as of 11/12/2019  Medication Sig  . amLODipine (NORVASC) 5 MG tablet Take 1 tablet (5 mg total) by mouth every evening.  . Blood Pressure Monitoring KIT Essential hypertension check bp daily  . Calcium Carbonate-Vitamin D 600-400 MG-UNIT per tablet Take 2 tablets by mouth daily.  . Coenzyme Q10 (COQ10) 400 MG CAPS Take 400 mg by mouth daily.   Marland Kitchen diltiazem (CARDIZEM CD) 240 MG 24 hr capsule Take 1 capsule (240 mg total) by mouth in the morning.  Marland Kitchen levothyroxine (SYNTHROID) 100 MCG tablet Take one tablet by mouth once daily 30 minutes before breakfast on empty stomach  . losartan (COZAAR) 50 MG tablet Take 1 tablet (50 mg total) by mouth in the morning and at bedtime.  . Multiple Vitamins-Minerals (PRESERVISION AREDS 2) CAPS  Take 1 capsule by mouth 2 (two) times a day.   Marland Kitchen OVER THE COUNTER MEDICATION Take 2 capsules by mouth daily. MitoQ  . traMADol (ULTRAM) 50 MG tablet Take 0.5 tablets (25 mg total) by mouth daily as needed for severe pain.  . vitamin B-12 (CYANOCOBALAMIN) 1000 MCG tablet Take 1 tablet (1,000 mcg total) by mouth daily.  . [DISCONTINUED] amoxicillin-clavulanate (AUGMENTIN) 875-125 MG tablet Take 1 tablet by mouth 2 (two) times daily.   No facility-administered encounter medications on file as of 11/12/2019.    Review of Systems:  Review of Systems  Constitutional: Negative for chills and fever.  HENT: Negative for congestion.   Eyes: Negative for blurred vision.  Respiratory: Positive for shortness of breath. Negative for  cough.   Cardiovascular: Positive for palpitations. Negative for chest pain and leg swelling.  Gastrointestinal: Negative for abdominal pain, blood in stool, constipation, diarrhea and melena.  Genitourinary: Negative for dysuria.  Musculoskeletal: Positive for back pain. Negative for falls and joint pain.  Neurological: Negative for dizziness and loss of consciousness.  Endo/Heme/Allergies: Bruises/bleeds easily.  Psychiatric/Behavioral: Negative for depression and memory loss. The patient is nervous/anxious.     Health Maintenance  Topic Date Due  . DEXA SCAN  09/21/2020 (Originally 01/04/1995)  . TETANUS/TDAP  08/28/2023  . INFLUENZA VACCINE  Completed  . COVID-19 Vaccine  Completed  . PNA vac Low Risk Adult  Completed    Physical Exam: Vitals:   11/12/19 0944  BP: 124/64  Pulse: 67  Temp: (!) 97.1 F (36.2 C)  SpO2: 98%  Weight: 158 lb (71.7 kg)  Height: _0  (1.6 m)   Body mass index is 27.99 kg/m. Physical Exam Vitals reviewed.  Constitutional:      Appearance: Normal appearance.  Cardiovascular:     Rate and Rhythm: Normal rate and regular rhythm.     Heart sounds: Murmur heard.      Comments: Audible murmur at apex and 2nd ICS loudest but heard throughout precordium Pulmonary:     Effort: Pulmonary effort is normal.     Breath sounds: Normal breath sounds. No rales.  Abdominal:     General: Bowel sounds are normal.  Musculoskeletal:        General: Normal range of motion.     Right lower leg: No edema.     Left lower leg: No edema.  Skin:    Coloration: Skin is pale.  Neurological:     General: No focal deficit present.     Mental Status: She is alert and oriented to person, place, and time.     Motor: Weakness present.  Psychiatric:        Mood and Affect: Mood normal.     Comments: Anxious and fidgeting with her hands today     Labs reviewed: Basic Metabolic Panel: Recent Labs    12/08/18 1004 12/08/18 1004 04/02/19 0821 06/04/19 0810  10/06/19 1303  NA 136   < > 138 137 136  K 4.8   < > 4.5 5.0 4.9  CL 102   < > 104 102 103  CO2 28   < > _1 GLUCOSE 86   < > 95 99 91  BUN 21   < > _2 CREATININE 0.86   < > 0.88 0.89* 0.74  CALCIUM 10.0   < > 10.2 10.3 9.5  TSH 4.58*  --   --  3.69  --    < > = values in  this interval not displayed.   Liver Function Tests: Recent Labs    12/08/18 1004 06/04/19 0810  AST 15 14  ALT 17 18  BILITOT 0.5 0.5  PROT 6.0* 6.0*   No results for input(s): LIPASE, AMYLASE in the last 8760 hours. No results for input(s): AMMONIA in the last 8760 hours. CBC: Recent Labs    12/08/18 1004 12/08/18 1004 04/02/19 0821 06/04/19 0810 10/02/19 1158  WBC 5.2   < > 4.8 4.4 4.9  NEUTROABS 3,442  --  2,918 2,803  --   HGB 13.7   < > 14.3 14.1 13.4  HCT 39.7   < > 42.0 41.9 38.6  MCV 88.8   < > 89.6 88.2 87  PLT 181   < > 167 175 150   < > = values in this interval not displayed.   Lipid Panel: Recent Labs    12/08/18 1004  CHOL 112  HDL 34*  LDLCALC 62  TRIG 80  CHOLHDL 3.3   Lab Results  Component Value Date   HGBA1C 5.1 08/22/2016    Procedures since last visit: Reviewed echo, stress test, cath, heart monitor  Assessment/Plan 1. SVT (supraventricular tachycardia) (HCC) -had 47 episodes in week when had monitor -has had to take two extra doses of cardizem yesterday and this morning due to palpitations -note that pt had been very active before she started having all of this--was able to go to multiple stores in one day, was very spunky and active and cared for her husband who had dementia -she has had a considerable decline since this began -she also has not been a complainer -recheck TSH today, but was normal in may  2. Exertional dyspnea -was not having this or palpitations last time she saw Dr. Terri Skains but it's worse  3. Aortic valve stenosis, etiology of cardiac valve disease unspecified -I'm concerned this is contributing to her symptoms -f/u echo is  in may  4. Severe mitral regurgitation -per echo, has f/u echo next may  5. Hypothyroidism, unspecified type - TSH -cont current levothyroxine  Labs/tests ordered:   Lab Orders     TSH  Next appt:  F/u in 6 wks with Amy   Matthewjames Petrasek L. Johan Antonacci, D.O. [facility] [address], [place] [address] Cell Phone (Mon-Fri 8am-5pm):  [phone] On Call:  [phone] & follow prompts after 5pm & weekends Office Phone:  [phone] Office Fax:  [phone]

## 2019-11-12 NOTE — ED Triage Notes (Signed)
Pt has been seeing a cardiologist and this week pt said her heart has been feeling like it is pounding. Pt said she could feel it but also could tell her heart rate was very low. Pt said she has been feeling more SOB today than before. Pt said she has no chest pain just pain sometimes in the back of her neck and across her shoulder blades. Pt is having nausea, no vomiting.

## 2019-11-12 NOTE — Telephone Encounter (Signed)
ON-CALL CARDIOLOGY 11/12/19  Patient's name: Donna [name].   MRN: [id number].    DOB: March 05, 1927 Primary care provider: Kermit Balo, DO. Primary cardiologist: Tessa Lerner, DO, [facility]  Interaction regarding this patient's care today: Patient called the on-call physician for guidance regarding her symptoms of palpitation and shortness of breath.  Last seen in the office on 11/03/2019 and overall was doing well from a cardiovascular standpoint.  The plan was to continue current medical therapy at that time as the left heart catheterization did not show any obstructive CAD and her symptoms of palpitations had essentially resolved/stable.  She called earlier this week stating that she was having symptoms of palpitations.  Given the Findings of 60 mm mid cavitary gradient on pullback patient was recommended to stay hydrated and to increase her Cardizem by an additional 120 mg p.o. daily.  Patient states that by increasing the Cardizem by an additional 120 mg p.o. daily for symptoms of palpitation improved yesterday.  However, this morning she started to experience palpitations again but not as bad as they are this evening.  She has seen her PCP earlier this morning and patient states that she was relatively stable.  But now she is having shortness of breath, she has gained approximately 5 pounds, she is having pain across the upper back, and continues to have symptoms of palpitation.  Uncomfortable is 106/51 and a pulse of 51 bpm.   Impression:   ICD-10-CM   1. Shortness of breath  R06.02   2. Palpitations  R00.2     Recommendations: Talking to her over the phone she did sound short of breath compared to her baseline.  I informed her that her root cause of her symptoms may be the underlying rhythm associated with her symptoms of palpitation.  Patient was getting ready to go to the ER to be further evaluated.  Since her symptoms are progressive I encouraged her to get evaluated at ER as well.    We can see her if she gets admitted, otherwise, I will have the office move up her appt.   Telephone encounter total time: .   Tessa Lerner, [place], [facility]  Pager: [phone] Office: [phone]

## 2019-11-12 NOTE — Patient Instructions (Signed)
Keep a diary of your symptoms, blood pressure and heart rate so you can share that with Dr. Odis Hollingshead.    If you have chest pain, severe shortness of breath or dizziness, please proceed to the ED and call your cardiology office to inform them.

## 2019-11-13 ENCOUNTER — Emergency Department (HOSPITAL_COMMUNITY): Payer: Medicare PPO

## 2019-11-13 ENCOUNTER — Encounter (HOSPITAL_COMMUNITY): Payer: Self-pay | Admitting: Internal Medicine

## 2019-11-13 DIAGNOSIS — E875 Hyperkalemia: Secondary | ICD-10-CM | POA: Diagnosis present

## 2019-11-13 DIAGNOSIS — N179 Acute kidney failure, unspecified: Secondary | ICD-10-CM | POA: Diagnosis present

## 2019-11-13 DIAGNOSIS — Z800 Family history of malignant neoplasm of digestive organs: Secondary | ICD-10-CM | POA: Diagnosis not present

## 2019-11-13 DIAGNOSIS — Z8249 Family history of ischemic heart disease and other diseases of the circulatory system: Secondary | ICD-10-CM | POA: Diagnosis not present

## 2019-11-13 DIAGNOSIS — E039 Hypothyroidism, unspecified: Secondary | ICD-10-CM | POA: Diagnosis present

## 2019-11-13 DIAGNOSIS — M1990 Unspecified osteoarthritis, unspecified site: Secondary | ICD-10-CM | POA: Diagnosis present

## 2019-11-13 DIAGNOSIS — I5031 Acute diastolic (congestive) heart failure: Secondary | ICD-10-CM | POA: Diagnosis not present

## 2019-11-13 DIAGNOSIS — I471 Supraventricular tachycardia: Secondary | ICD-10-CM | POA: Diagnosis present

## 2019-11-13 DIAGNOSIS — Z87891 Personal history of nicotine dependence: Secondary | ICD-10-CM | POA: Diagnosis not present

## 2019-11-13 DIAGNOSIS — R001 Bradycardia, unspecified: Secondary | ICD-10-CM | POA: Diagnosis present

## 2019-11-13 DIAGNOSIS — G43909 Migraine, unspecified, not intractable, without status migrainosus: Secondary | ICD-10-CM | POA: Diagnosis present

## 2019-11-13 DIAGNOSIS — Z79890 Hormone replacement therapy (postmenopausal): Secondary | ICD-10-CM | POA: Diagnosis not present

## 2019-11-13 DIAGNOSIS — Z87440 Personal history of urinary (tract) infections: Secondary | ICD-10-CM | POA: Diagnosis not present

## 2019-11-13 DIAGNOSIS — Z66 Do not resuscitate: Secondary | ICD-10-CM | POA: Diagnosis present

## 2019-11-13 DIAGNOSIS — I110 Hypertensive heart disease with heart failure: Secondary | ICD-10-CM | POA: Diagnosis present

## 2019-11-13 DIAGNOSIS — Z808 Family history of malignant neoplasm of other organs or systems: Secondary | ICD-10-CM | POA: Diagnosis not present

## 2019-11-13 DIAGNOSIS — Z79899 Other long term (current) drug therapy: Secondary | ICD-10-CM | POA: Diagnosis not present

## 2019-11-13 DIAGNOSIS — I080 Rheumatic disorders of both mitral and aortic valves: Secondary | ICD-10-CM | POA: Diagnosis present

## 2019-11-13 DIAGNOSIS — Z20822 Contact with and (suspected) exposure to covid-19: Secondary | ICD-10-CM | POA: Diagnosis present

## 2019-11-13 DIAGNOSIS — K922 Gastrointestinal hemorrhage, unspecified: Secondary | ICD-10-CM | POA: Diagnosis present

## 2019-11-13 DIAGNOSIS — Z823 Family history of stroke: Secondary | ICD-10-CM | POA: Diagnosis not present

## 2019-11-13 DIAGNOSIS — Z8261 Family history of arthritis: Secondary | ICD-10-CM | POA: Diagnosis not present

## 2019-11-13 DIAGNOSIS — E871 Hypo-osmolality and hyponatremia: Secondary | ICD-10-CM | POA: Diagnosis present

## 2019-11-13 DIAGNOSIS — R0602 Shortness of breath: Secondary | ICD-10-CM | POA: Diagnosis present

## 2019-11-13 LAB — COMPREHENSIVE METABOLIC PANEL
ALT: 64 U/L — ABNORMAL HIGH (ref 0–44)
AST: 27 U/L (ref 15–41)
Albumin: 4.1 g/dL (ref 3.5–5.0)
Alkaline Phosphatase: 47 U/L (ref 38–126)
Anion gap: 8 (ref 5–15)
BUN: 56 mg/dL — ABNORMAL HIGH (ref 8–23)
CO2: 23 mmol/L (ref 22–32)
Calcium: 10.2 mg/dL (ref 8.9–10.3)
Chloride: 100 mmol/L (ref 98–111)
Creatinine, Ser: 1.65 mg/dL — ABNORMAL HIGH (ref 0.44–1.00)
GFR, Estimated: 30 mL/min — ABNORMAL LOW (ref >60)
Glucose, Bld: 126 mg/dL — ABNORMAL HIGH (ref 70–99)
Potassium: 5.8 mmol/L — ABNORMAL HIGH (ref 3.5–5.1)
Sodium: 131 mmol/L — ABNORMAL LOW (ref 135–145)
Total Bilirubin: 0.8 mg/dL (ref 0.3–1.2)
Total Protein: 6.2 g/dL — ABNORMAL LOW (ref 6.5–8.1)

## 2019-11-13 LAB — HEPATIC FUNCTION PANEL
ALT: 52 U/L — ABNORMAL HIGH (ref 0–44)
AST: 22 U/L (ref 15–41)
Albumin: 3.7 g/dL (ref 3.5–5.0)
Alkaline Phosphatase: 41 U/L (ref 38–126)
Bilirubin, Direct: 0.1 mg/dL (ref 0.0–0.2)
Indirect Bilirubin: 1 mg/dL — ABNORMAL HIGH (ref 0.3–0.9)
Total Bilirubin: 1.1 mg/dL (ref 0.3–1.2)
Total Protein: 5.6 g/dL — ABNORMAL LOW (ref 6.5–8.1)

## 2019-11-13 LAB — BASIC METABOLIC PANEL
Anion gap: 9 (ref 5–15)
BUN: 58 mg/dL — ABNORMAL HIGH (ref 8–23)
CO2: 21 mmol/L — ABNORMAL LOW (ref 22–32)
Calcium: 10.1 mg/dL (ref 8.9–10.3)
Chloride: 101 mmol/L (ref 98–111)
Creatinine, Ser: 1.73 mg/dL — ABNORMAL HIGH (ref 0.44–1.00)
GFR, Estimated: 28 mL/min — ABNORMAL LOW (ref >60)
Glucose, Bld: 111 mg/dL — ABNORMAL HIGH (ref 70–99)
Potassium: 6 mmol/L — ABNORMAL HIGH (ref 3.5–5.1)
Sodium: 131 mmol/L — ABNORMAL LOW (ref 135–145)

## 2019-11-13 LAB — TROPONIN I (HIGH SENSITIVITY)
Troponin I (High Sensitivity): 27 ng/L — ABNORMAL HIGH (ref <18)
Troponin I (High Sensitivity): 29 ng/L — ABNORMAL HIGH (ref <18)
Troponin I (High Sensitivity): 30 ng/L — ABNORMAL HIGH (ref <18)

## 2019-11-13 LAB — CBC WITH DIFFERENTIAL/PLATELET
Abs Immature Granulocytes: 0.04 10*3/uL (ref 0.00–0.07)
Abs Immature Granulocytes: 0.04 10*3/uL (ref 0.00–0.07)
Basophils Absolute: 0 10*3/uL (ref 0.0–0.1)
Basophils Absolute: 0 10*3/uL (ref 0.0–0.1)
Basophils Relative: 0 %
Basophils Relative: 0 %
Eosinophils Absolute: 0.1 10*3/uL (ref 0.0–0.5)
Eosinophils Absolute: 0.1 10*3/uL (ref 0.0–0.5)
Eosinophils Relative: 1 %
Eosinophils Relative: 1 %
HCT: 35.1 % — ABNORMAL LOW (ref 36.0–46.0)
HCT: 38.4 % (ref 36.0–46.0)
Hemoglobin: 11.3 g/dL — ABNORMAL LOW (ref 12.0–15.0)
Hemoglobin: 12.3 g/dL (ref 12.0–15.0)
Immature Granulocytes: 0 %
Immature Granulocytes: 0 %
Lymphocytes Relative: 16 %
Lymphocytes Relative: 17 %
Lymphs Abs: 1.6 10*3/uL (ref 0.7–4.0)
Lymphs Abs: 1.6 10*3/uL (ref 0.7–4.0)
MCH: 29.8 pg (ref 26.0–34.0)
MCH: 30 pg (ref 26.0–34.0)
MCHC: 32 g/dL (ref 30.0–36.0)
MCHC: 32.2 g/dL (ref 30.0–36.0)
MCV: 93 fL (ref 80.0–100.0)
MCV: 93.1 fL (ref 80.0–100.0)
Monocytes Absolute: 1.1 10*3/uL — ABNORMAL HIGH (ref 0.1–1.0)
Monocytes Absolute: 1.1 10*3/uL — ABNORMAL HIGH (ref 0.1–1.0)
Monocytes Relative: 11 %
Monocytes Relative: 13 %
Neutro Abs: 6.3 10*3/uL (ref 1.7–7.7)
Neutro Abs: 7.1 10*3/uL (ref 1.7–7.7)
Neutrophils Relative %: 69 %
Neutrophils Relative %: 72 %
Platelets: 159 10*3/uL (ref 150–400)
Platelets: 184 10*3/uL (ref 150–400)
RBC: 3.77 MIL/uL — ABNORMAL LOW (ref 3.87–5.11)
RBC: 4.13 MIL/uL (ref 3.87–5.11)
RDW: 13.6 % (ref 11.5–15.5)
RDW: 13.7 % (ref 11.5–15.5)
WBC: 9.1 10*3/uL (ref 4.0–10.5)
WBC: 9.9 10*3/uL (ref 4.0–10.5)
nRBC: 0 % (ref 0.0–0.2)
nRBC: 0 % (ref 0.0–0.2)

## 2019-11-13 LAB — BRAIN NATRIURETIC PEPTIDE: B Natriuretic Peptide: 1702.9 pg/mL — ABNORMAL HIGH (ref 0.0–100.0)

## 2019-11-13 LAB — RESPIRATORY PANEL BY RT PCR (FLU A&B, COVID)
Influenza A by PCR: NEGATIVE
Influenza B by PCR: NEGATIVE
SARS Coronavirus 2 by RT PCR: NEGATIVE

## 2019-11-13 LAB — TSH: TSH: 2.77 mIU/L (ref 0.40–4.50)

## 2019-11-13 IMAGING — CR DG CHEST 2V
2 series · 2 of 2 positions shown · non-contrast
Comparison: [DATE]

CLINICAL DATA: Shortness of breath

EXAM:
CHEST - 2 VIEW

[chest lat]
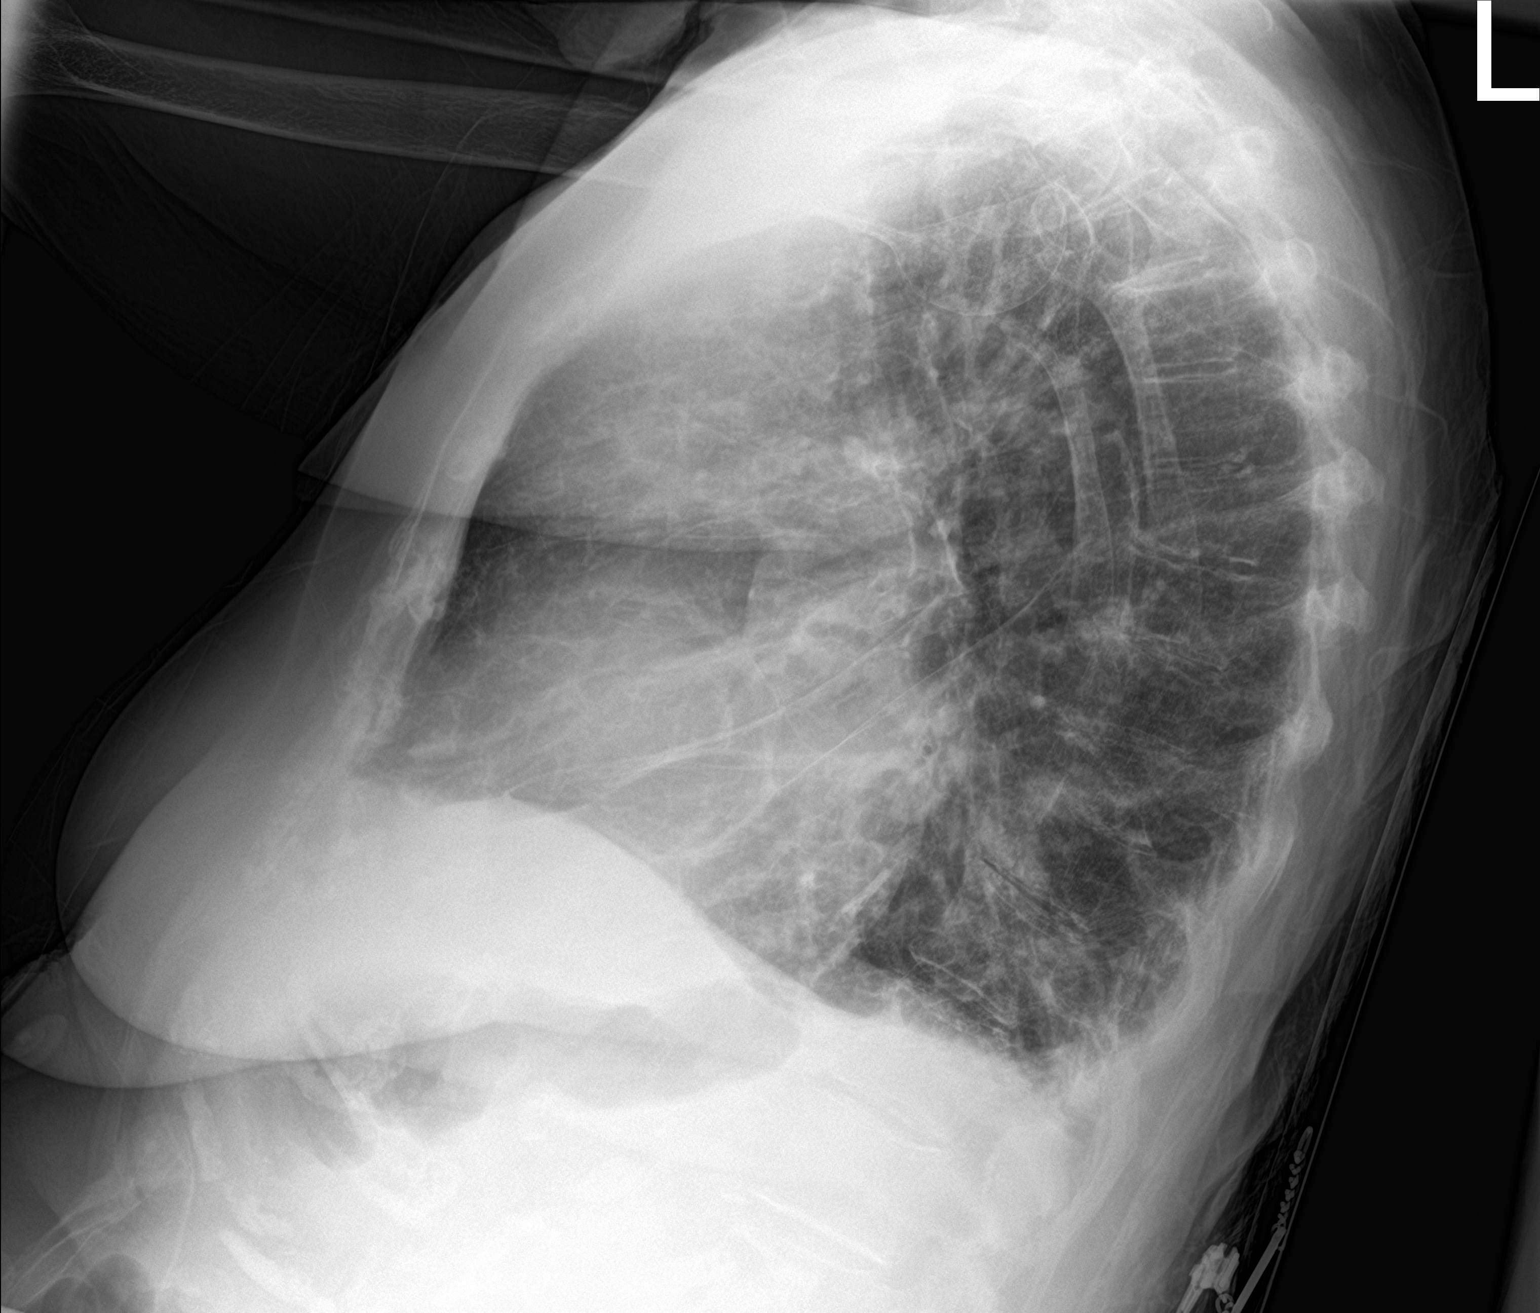

[chest ap]
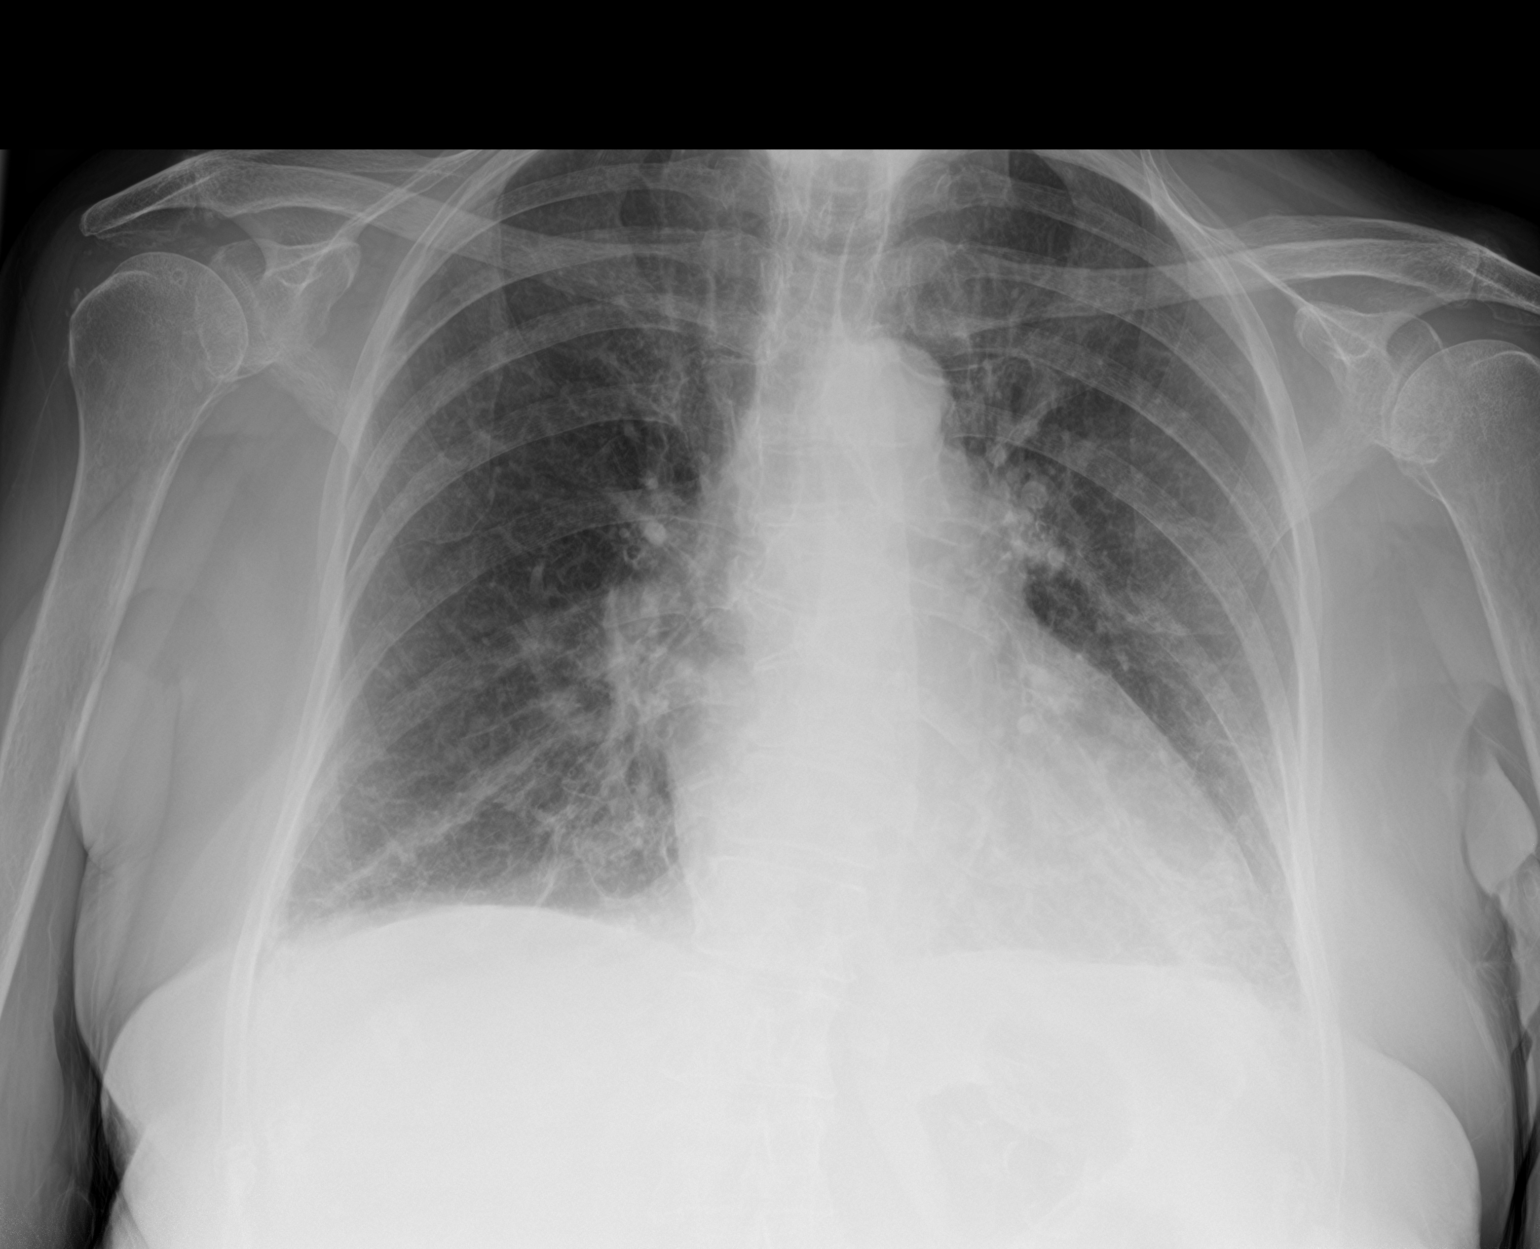

[2 of 2 positions shown; findings below may reference images not displayed]

FINDINGS: The heart size and mediastinal contours are borderline enlarged.
There is prominence of the central pulmonary vasculature. The
visualized skeletal structures are unremarkable.
IMPRESSION: Mild cardiomegaly and pulmonary vascular congestion.

## 2019-11-13 MED ORDER — ENOXAPARIN SODIUM 30 MG/0.3ML ~~LOC~~ SOLN
30.0000 mg | SUBCUTANEOUS | Status: DC
  Administered 2019-11-13 – 2019-11-14 (×2): 30 mg via SUBCUTANEOUS
  Filled 2019-11-13 (×2): qty 0.3

## 2019-11-13 MED ORDER — SODIUM ZIRCONIUM CYCLOSILICATE 5 G PO PACK
5.0000 g | PACK | Freq: Once | ORAL | Status: AC
  Administered 2019-11-13: 5 g via ORAL
  Filled 2019-11-13: qty 1

## 2019-11-13 MED ORDER — VITAMIN B-12 1000 MCG PO TABS
1000.0000 ug | ORAL_TABLET | Freq: Every day | ORAL | Status: DC
  Administered 2019-11-14: 1000 ug via ORAL
  Filled 2019-11-13: qty 1

## 2019-11-13 MED ORDER — ONDANSETRON HCL 4 MG/2ML IJ SOLN: 4.0000 mg | Freq: Once | INTRAMUSCULAR | Status: DC

## 2019-11-13 MED ORDER — ONDANSETRON HCL 4 MG/2ML IJ SOLN
4.0000 mg | Freq: Once | INTRAMUSCULAR | Status: AC
  Administered 2019-11-13: 4 mg via INTRAVENOUS
  Filled 2019-11-13: qty 2

## 2019-11-13 MED ORDER — LEVOTHYROXINE SODIUM 100 MCG PO TABS
100.0000 ug | ORAL_TABLET | Freq: Every day | ORAL | Status: DC
  Administered 2019-11-13 – 2019-11-14 (×2): 100 ug via ORAL
  Filled 2019-11-13 (×2): qty 1

## 2019-11-13 MED ORDER — ACETAMINOPHEN 325 MG PO TABS: 650.0000 mg | ORAL_TABLET | Freq: Four times a day (QID) | ORAL | Status: DC | PRN

## 2019-11-13 MED ORDER — DILTIAZEM HCL 60 MG PO TABS
30.0000 mg | ORAL_TABLET | Freq: Three times a day (TID) | ORAL | Status: DC
  Administered 2019-11-13 – 2019-11-14 (×3): 30 mg via ORAL
  Filled 2019-11-13 (×4): qty 1

## 2019-11-13 MED ORDER — ACETAMINOPHEN 650 MG RE SUPP: 650.0000 mg | Freq: Four times a day (QID) | RECTAL | Status: DC | PRN

## 2019-11-13 MED ORDER — FUROSEMIDE 10 MG/ML IJ SOLN: 20.0000 mg | Freq: Once | INTRAMUSCULAR | Status: DC

## 2019-11-13 MED ORDER — SODIUM ZIRCONIUM CYCLOSILICATE 10 G PO PACK
10.0000 g | PACK | Freq: Once | ORAL | Status: AC
  Administered 2019-11-13: 10 g via ORAL
  Filled 2019-11-13: qty 1

## 2019-11-13 MED ORDER — CALCIUM GLUCONATE-NACL 1-0.675 GM/50ML-% IV SOLN
1.0000 g | Freq: Once | INTRAVENOUS | Status: AC
  Administered 2019-11-13: 1000 mg via INTRAVENOUS
  Filled 2019-11-13: qty 50

## 2019-11-13 MED ORDER — AMLODIPINE BESYLATE 5 MG PO TABS
5.0000 mg | ORAL_TABLET | Freq: Every evening | ORAL | Status: DC
  Administered 2019-11-13: 5 mg via ORAL
  Filled 2019-11-13: qty 1

## 2019-11-13 MED ORDER — FUROSEMIDE 10 MG/ML IJ SOLN
20.0000 mg | Freq: Once | INTRAMUSCULAR | Status: AC
  Administered 2019-11-13: 20 mg via INTRAVENOUS
  Filled 2019-11-13: qty 2

## 2019-11-13 MED ORDER — ONDANSETRON 4 MG PO TBDP
8.0000 mg | ORAL_TABLET | Freq: Once | ORAL | Status: AC
  Administered 2019-11-13: 8 mg via ORAL
  Filled 2019-11-13: qty 2

## 2019-11-13 NOTE — ED Notes (Signed)
Patient assisted to bathroom and back to stretcher. States she is feeling and breathing much better.

## 2019-11-13 NOTE — Consult Note (Signed)
CARDIOLOGY CONSULT NOTE  Patient ID: Donna [name] MRN: [id number] DOB/AGE: 1927-11-22 86 y.o.  Admit date: 11/12/2019 Attending physician: Oren Binet* Primary Physician:  Gayland Curry, DO Outpatient Cardiologist: Rex Kras, DO, [facility] Inpatient Cardiologist: Rex Kras, DO, [facility]  Chief complaint: Shortness of breath and heart pounding  HPI:  Donna [name] is a 86 y.o. Caucasian female who presents with a chief complaint of " shortness of breath and heart pounding." Her past medical history and cardiovascular risk factors include: Hypertension, supraventricular tachycardia, mid cavitary LVOT obstruction, mitral regurgitation, history of GI bleeding, postmenopausal female, advanced age.  Is a very pleasant 86 year old Caucasian female who was referred to the office for management of palpitation back in August 2021.  The work-up included a Holter monitor, echocardiogram, stress test and left heart catheterization.  Given her symptoms of palpitations, multiple episodes of supraventricular tachycardia on extended Holter monitor, mid cavitary gradient noted on invasive angiography Cardizem was uptitrated to 240 mg p.o. daily at the last office visit.  She had called the office 2 days ago complaining of symptoms of heart pounding and knowing her history of SVT recommended that she take an extra 120 mg of Cardizem to see if her symptoms improve.  She stated that the symptoms did improve for 1 day; however, the following day they resurfaced.  Yesterday prior to admission she continued to have symptoms of palpitations, soft blood pressures, shortness of breath, weight gain, decreased urine output and as result came to the hospital for further evaluation and management.  EKG on arrival 11/12/2019 noted junctional rhythm at 40 bpm, normal axis, without underlying injury pattern.  Lab work noted multiple electrolyte abnormalities including hyperkalemia and acute kidney injury.  Troponins  are elevated but overall flat.  BNP elevated at 1702, TSH 2.77.  She recently also had a urinary tract infection and was treated with oral antibiotics.  Prior to arrival she was having symptoms of nausea, back pain as well.  Since admission, patient states that she is feeling better.  Her underlying rhythm is sinus on telemetry and ventricular rate is well controlled.  Her blood pressure is stable.  Her shortness of breath is improving.  She remains nauseated, per patient and daughter urine output is low, her serum creatinine this morning is trending up and she continues to be hyperkalemic.  ALLERGIES: No Known Allergies  PAST MEDICAL HISTORY: Past Medical History:  Diagnosis Date  . Aortic stenosis   . Hemorrhage of gastrointestinal tract, unspecified   . Hypertension   . Migraine   . Mitral regurgitation   . OA (osteoarthritis)   . Thyroid cyst     PAST SURGICAL HISTORY: Past Surgical History:  Procedure Laterality Date  . CATARACT EXTRACTION  1990   rt  . CATARACT EXTRACTION  2005   left  . LEFT HEART CATH AND CORONARY ANGIOGRAPHY N/A 10/06/2019   Procedure: LEFT HEART CATH AND CORONARY ANGIOGRAPHY;  Surgeon: Nigel Mormon, MD;  Location: [facility];  Service: Cardiovascular;  Laterality: N/A;  . REVISION TOTAL KNEE ARTHROPLASTY  2008  . THUMB ARTHROSCOPY    . TONSILLECTOMY      FAMILY HISTORY: The patient family history includes Arthritis in her sister; Brain cancer in her brother; CVA (age of onset: 75) in her sister; CVA (age of onset: 51) in her father; Heart disease in her sister; Pancreatic cancer in her mother.   SOCIAL HISTORY:  The patient  reports that she quit smoking about 71 years  ago. Her smoking use included cigarettes. She quit after 2.00 years of use. She has never used smokeless tobacco. She reports previous alcohol use. She reports that she does not use drugs.  MEDICATIONS: Current Outpatient Medications  Medication Instructions  .  amLODipine (NORVASC) 5 mg, Oral, Every evening  . Blood Pressure Monitoring KIT Essential hypertension check bp daily  . Calcium Carbonate-Vitamin D 600-400 MG-UNIT per tablet 2 tablets, Oral, Daily  . CoQ10 400 mg, Oral, Daily  . diltiazem (CARDIZEM CD) 240 mg, Oral, Every morning  . levothyroxine (SYNTHROID) 100 MCG tablet Take one tablet by mouth once daily 30 minutes before breakfast on empty stomach  . losartan (COZAAR) 50 mg, Oral, 2 times daily  . Multiple Vitamins-Minerals (PRESERVISION AREDS 2) CAPS 1 capsule, Oral, 2 times daily  . OVER THE COUNTER MEDICATION 2 capsules, Oral, Daily, MitoQ  . traMADol (ULTRAM) 25 mg, Oral, Daily PRN  . vitamin B-12 (CYANOCOBALAMIN) 1,000 mcg, Oral, Daily    14 ORGAN REVIEW OF SYSTEMS: Review of Systems  Constitutional: Negative for chills and fever.  HENT: Negative for hoarse voice and nosebleeds.   Eyes: Negative for discharge, double vision and pain.  Cardiovascular: Positive for palpitations. Negative for chest pain, claudication, dyspnea on exertion, leg swelling, near-syncope, orthopnea, paroxysmal nocturnal dyspnea and syncope.  Respiratory: Positive for shortness of breath. Negative for hemoptysis.   Musculoskeletal: Negative for muscle cramps and myalgias.  Gastrointestinal: Positive for nausea. Negative for abdominal pain, constipation, diarrhea, hematemesis, hematochezia, melena and vomiting.  Genitourinary:       Back pain, no flank pain, recent UTI  Neurological: Negative for dizziness and light-headedness.  All other systems reviewed and are negative.  PHYSICAL EXAM: Vitals with BMI 11/13/2019 11/13/2019 11/13/2019  Height - - -  Weight - - -  BMI - - -  Systolic 671 245 -  Diastolic 49 42 -  Pulse 80 50 49     Intake/Output Summary (Last 24 hours) at 11/13/2019 1250 Last data filed at 11/13/2019 0335 Gross per 24 hour  Intake 50 ml  Output --  Net 50 ml    Net IO Since Admission: 50 mL [11/13/19  1250]  CONSTITUTIONAL: Age-appropriate female, hemodynamically stable, no acute distress.    SKIN: Skin is warm and dry. No rash noted. No cyanosis. No pallor. No jaundice HEAD: Normocephalic and atraumatic.  EYES: No scleral icterus MOUTH/THROAT: Moist oral membranes.  NECK: No JVD present. No thyromegaly noted.  Bilateral carotid bruits left greater than right. LYMPHATIC: No visible cervical adenopathy.  CHEST Normal respiratory effort. No intercostal retractions  LUNGS: Mild crackles heard bilaterally, no stridor. No wheezes. No rales.  CARDIOVASCULAR: Regular, positive Y0-D9, soft systolic ejection murmur heard at the second intercostal space, soft holosystolic murmur heard at the apex, no gallops or rubs. ABDOMINAL: Soft, nontender, nondistended, left upper quadrants, no apparent ascites.  EXTREMITIES: No peripheral edema  HEMATOLOGIC: No significant bruising NEUROLOGIC: Oriented to person, place, and time. Nonfocal. Normal muscle tone.  PSYCHIATRIC: Normal mood and affect. Normal behavior. Cooperative  RADIOLOGY: DG Chest 2 View  Result Date: 11/13/2019 CLINICAL DATA:  Shortness of breath EXAM: CHEST - 2 VIEW COMPARISON:  January 28, 2012 FINDINGS: The heart size and mediastinal contours are borderline enlarged. There is prominence of the central pulmonary vasculature. The visualized skeletal structures are unremarkable. IMPRESSION: Mild cardiomegaly and pulmonary vascular congestion. Electronically Signed   By: Prudencio Pair M.D.   On: 11/13/2019 00:18    LABORATORY DATA: Lab Results  Component Value  Date   WBC 9.1 11/13/2019   HGB 11.3 (L) 11/13/2019   HCT 35.1 (L) 11/13/2019   MCV 93.1 11/13/2019   PLT 159 11/13/2019    Recent Labs  Lab 11/13/19 0547  NA 131*  K 6.0*  CL 101  CO2 21*  BUN 58*  CREATININE 1.73*  CALCIUM 10.1  PROT 5.6*  BILITOT 1.1  ALKPHOS 41  ALT 52*  AST 22  GLUCOSE 111*    Lipid Panel     Component Value Date/Time   CHOL 112  12/08/2018 1004   CHOL 118 08/04/2014 0806   TRIG 80 12/08/2018 1004   HDL 34 (L) 12/08/2018 1004   HDL 34 (L) 08/04/2014 0806   CHOLHDL 3.3 12/08/2018 1004   VLDL 15 08/22/2016 0910   LDLCALC 62 12/08/2018 1004    BNP (last 3 results) Recent Labs    11/12/19 2353  BNP 1,702.9*    HEMOGLOBIN A1C Lab Results  Component Value Date   HGBA1C 5.1 08/22/2016   MPG 100 08/22/2016    Cardiac Panel (last 3 results) No results for input(s): CKTOTAL, CKMB, RELINDX in the last 8760 hours.  Invalid input(s): TROPONINHS  No results found for: CKTOTAL, CKMB, CKMBINDEX   TSH Recent Labs    12/08/18 1004 06/04/19 0810 11/12/19 1027  TSH 4.58* 3.69 2.77      CARDIAC DATABASE: EKG: 08/26/2019:Sinus  Rhythm, 67bpm, normal axis, PRWP, possible old inferior infarct, nonspecific ST-T changes in high lateral leads (I and avL).  11/12/2019: Junctional rhythm, 48 bpm, normal axis, without underlying injury pattern.   Echocardiogram: 09/09/2019:  Left ventricle cavity is normal in size. Severe concentric hypertrophy of the left ventricle. Normal global wall motion. Normal LV systolic function with visual EF 50-55%. Doppler evidence of grade I (impaired) diastolic dysfunction, normal LAP.  Left atrial cavity is severely dilated.  Moderate calcification. Mild aortic stenosis, in addition to mild LVOT obstruction. Aortic valve mean gradient of 18 mmHg, Vmax of 3.0 m/s. Calculated aortic valve area by continuity equation is 2 cm. Mild (grade I) aortic regurgitation. Moderate aortic stenosis. Moderate (Grade II) aortic regurgitation.  Severe (Grade III) mitral regurgitation.  Moderate tricuspid regurgitation. Estimated pulmonary artery systolic pressure 28 mmHg.   Stress Testing: None  Heart Catheterization: 10/06/2019: LM: Normal LAD: Mild luminal irregularities LCx: Normal RCA: Normal  LVEDP 14 mmHg 60 mmHg mid-cavitary gradient on pullback No significant LV-Ao gradient on  pullback  Carotid artery duplex 09/09/2019:  Minimal stenosis in the right internal carotid artery (minimal).  Minimal stenosis in the left internal carotid artery (minimal).  Minimal plaque noted in the CCA bilaterally without significant stenosis.  Antegrade right vertebral artery flow. Antegrade left vertebral artery flow.  Follow up studies if clinically indicated.  7 day extended Holter monitor: Predominantly normal sinus rhythm (NSR).  HR 40-210 bpm.  Avg HR 79 bpm. Minimum HR 40 bpm on 08/29/19 at 1:02pm sinus with junctional escape. No atrial fibrillation/NSVT/pause (3 secs or longer). Total ventricular ectopic burden <1%. Total supraventricular ectopic burden <1%. 42 episodes of SVT.  Fastest and longest run was 1hr 17 mins with max HR 210 bpm (avg 155 bpm). Patient triggered events: 6.  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    IMPRESSION & RECOMMENDATIONS: Kenidee Cregan is a 86 y.o. Caucasian female whose past medical history and cardiovascular risk factors include:  Hypertension, supraventricular tachycardia, mid cavitary LVOT obstruction, mitral regurgitation, history of GI bleeding, postmenopausal female,  advanced age.  Palpitations:  Given her symptoms of palpitation, history of supraventricular tachycardia, mid cavitary gradient of 60 mmHg on pullback for invasive angiography would recommend continuation Cardizem.  Patient's home dose of Cardizem was 240 mg p.o. daily.  We will decrease the Cardizem to 30 mg p.o. 3 times daily and monitor her on telemetry.  Patient did present with junctional rhythm on presentation but now is normal sinus rhythm without any significant ectopy.  Monitor for now.  Junctional rhythm:   Currently normal sinus rhythm on telemetry.  Check EKG.  Acute diastolic heart failure, stage C, NYHA class III:  On presentation patient was having shortness of breath with effort  related activities, chest x-ray noted vascular congestion, elevated BNP, crackles on physical examination, and weight gain.  Patient did receive IV Lasix today.  Strict I's and O's, daily weights.  Hold ARB secondary to acute kidney injury.  We will slowly uptitrate guideline directed medical therapy.  Patient has undergone ischemic evaluation.  Recently including an echocardiogram, stress test, and Motrin catheterization.  No additional testing recommended at this time.  Acute kidney injury on chronic kidney disease:  Currently managed by primary team.  May consider evaluation for nephrolithiasis or hydronephrosis as patient complains of decreased urine output, back pain, nausea, and recent urinary tract infection treated with normal antibiotics.  May consider a trial of IV fluids.   Secondary Diagnosis:  Benign essential hypertension: continue hypertensive medications.  Monitor BP.   Mitral regurgitation, severe: Monitor to now.    Hyperkalemia: Currently managed by primary team.  Time spent: 84 mins  Patient's questions and concerns were addressed to her satisfaction. She voices understanding of the instructions provided during this encounter.   This note was created using a voice recognition software as a result there may be grammatical errors inadvertently enclosed that do not reflect the nature of this encounter. Every attempt is made to correct such errors.  Mechele Claude [facility]  Pager: [phone] Office: [phone] 11/13/2019, 12:50 PM

## 2019-11-13 NOTE — Progress Notes (Signed)
Briefly, patient is an 86 year old female with HTN, mild LVOT, apparent SVT who was admitted earlier this morning with symptomatic bradycardia and found to be in acute kidney injury.  Patient apparently normally takes 240 mg Cardizem daily, she had had some palpitations and called her cardiologist Dr. Odis Hollingshead who told her to take another half a tablet.  Patient took a total of 360 mg of Cardizem daily and subsequently felt short of breath with exertion and nausea.  In the ED she was noted to have heart rate in the mid 40s with a junctional escape rhythm as well as a creatinine of 1.6, usually at 0.8 and a potassium of 5.8. Patient was treated with IV calcium gluconate, Lokelma and Cardizem was held.  ARB was also held given AKI.  Cardiology consultation was placed.  This morning, patient complained of some intermittent nausea which has been going on for 2 days which is the same amount of time that she has had shortness of breath and bradycardia.  She did find ondansetron to be helpful yesterday and this is repeated today.  Potassium is still elevated at 6.0 despite Lokelma last night, will repeat Lokelma and recheck potassium in the morning.  Agree with continuing to hold ARB given AKI and hyperkalemia. Patient remains on telemetry.  Await note from cardiology, Dr. Odis Hollingshead.

## 2019-11-13 NOTE — ED Notes (Signed)
Lunch tray given. 

## 2019-11-13 NOTE — ED Notes (Signed)
Assisted up to bathroom ambulates good, o2 sats actually increased after ambulating. To 94%

## 2019-11-13 NOTE — ED Provider Notes (Signed)
[facility] EMERGENCY DEPARTMENT Provider Note   CSN: [id number] Arrival date & time: 11/12/19  2342     History Chief Complaint  Patient presents with  . Shortness of Breath    pounding HB    Donna [name] is a 86 y.o. female.  Patient has been following with her cardiologist, Dr. Terri Skains, because she was having palpitations.  Increased her Cardizem and started on Norvasc.  Within the last day or so she has had increased leg, abdominal edema and shortness of breath.  She is also noticed that her heart rate was low on her home monitor.  She presents here for these complaints.  No other medication changes.  No other recent illnesses.  No other associated issues.   Shortness of Breath Severity:  Mild Onset quality:  Gradual Duration:  2 days Timing:  Constant Chronicity:  New Relieved by:  None tried Worsened by:  Nothing      Past Medical History:  Diagnosis Date  . Aortic stenosis   . Hemorrhage of gastrointestinal tract, unspecified   . Hypertension   . Migraine   . Mitral regurgitation   . OA (osteoarthritis)   . Thyroid cyst     Patient Active Problem List   Diagnosis Date Noted  . Bradycardia 11/13/2019  . Aortic valve stenosis 11/12/2019  . Severe mitral regurgitation 11/12/2019  . SVT (supraventricular tachycardia) ([place]) 11/12/2019  . Exertional dyspnea 10/02/2019  . Abnormal stress test 10/02/2019  . Overweight (BMI 25.0-29.9) 10/14/2017  . BMI 27.0-27.9,adult 10/14/2017  . Balance problem 10/14/2017  . Neuropathic pain 09/03/2016  . Caregiver stress 09/03/2016  . Bilateral nonexudative age-related macular degeneration 09/03/2016  . Generalized osteoarthritis of multiple sites 05/07/2013  . Hypothyroidism 05/07/2013  . Cyst of thyroid 05/07/2013  . Hyperhidrosis 05/07/2013  . Essential hypertension, benign 05/07/2013  . Cough 01/31/2012  . Abnormal CXR 01/31/2012    Past Surgical History:  Procedure Laterality Date  . CATARACT  EXTRACTION  1990   rt  . CATARACT EXTRACTION  2005   left  . LEFT HEART CATH AND CORONARY ANGIOGRAPHY N/A 10/06/2019   Procedure: LEFT HEART CATH AND CORONARY ANGIOGRAPHY;  Surgeon: Nigel Mormon, MD;  Location: [facility];  Service: Cardiovascular;  Laterality: N/A;  . REVISION TOTAL KNEE ARTHROPLASTY  2008  . THUMB ARTHROSCOPY    . TONSILLECTOMY       OB History   No obstetric history on file.     Family History  Problem Relation Age of Onset  . CVA Father 50  . Pancreatic cancer Mother   . CVA Sister 28  . Brain cancer Brother   . Arthritis Sister   . Heart disease Sister   . Breast cancer Neg Hx     Social History   Tobacco Use  . Smoking status: Former Smoker    Years: 2.00    Types: Cigarettes    Quit date: 01/16/1948    Years since quitting: 71.8  . Smokeless tobacco: Never Used  . Tobacco comment: smoked on occ when she was a teen  Vaping Use  . Vaping Use: Never used  Substance Use Topics  . Alcohol use: Not Currently    Comment: rarely has a glass of wine  . Drug use: No    Home Medications Prior to Admission medications   Medication Sig Start Date End Date Taking? Authorizing Provider  amLODipine (NORVASC) 5 MG tablet Take 1 tablet (5 mg total) by mouth every evening.  11/03/19 02/01/20 Yes Tolia, Sunit, DO  Calcium Carbonate-Vitamin D 600-400 MG-UNIT per tablet Take 2 tablets by mouth daily.   Yes [provider]  Coenzyme Q10 (COQ10) 400 MG CAPS Take 400 mg by mouth daily.    Yes [provider]  diltiazem (CARDIZEM CD) 240 MG 24 hr capsule Take 1 capsule (240 mg total) by mouth in the morning. 11/03/19 02/01/20 Yes Tolia, Sunit, DO  levothyroxine (SYNTHROID) 100 MCG tablet Take one tablet by mouth once daily 30 minutes before breakfast on empty stomach 10/28/19  Yes Eubanks, Carlos American, NP  losartan (COZAAR) 50 MG tablet Take 1 tablet (50 mg total) by mouth in the morning and at bedtime. 10/28/19  Yes [name], NP   Multiple Vitamins-Minerals (PRESERVISION AREDS 2) CAPS Take 1 capsule by mouth 2 (two) times a day.    Yes [provider]  OVER THE COUNTER MEDICATION Take 2 capsules by mouth daily. MitoQ   Yes [provider]  traMADol (ULTRAM) 50 MG tablet Take 0.5 tablets (25 mg total) by mouth daily as needed for severe pain. 10/01/19  Yes Reed, Tiffany L, DO  vitamin B-12 (CYANOCOBALAMIN) 1000 MCG tablet Take 1 tablet (1,000 mcg total) by mouth daily. 03/07/17  Yes Reed, Tiffany L, DO  Blood Pressure Monitoring KIT Essential hypertension check bp daily 08/20/19   Hollace Kinnier L, DO    Allergies    Patient has no known allergies.  Review of Systems   Review of Systems  Respiratory: Positive for shortness of breath.   All other systems reviewed and are negative.   Physical Exam Updated Vital Signs BP (!) 124/45   Pulse (!) 50   Temp (!) 97.5 F (36.4 C) (Oral)   Resp 15   SpO2 92%   Physical Exam Vitals and nursing note reviewed.  Constitutional:      Appearance: She is well-developed.  HENT:     Head: Normocephalic and atraumatic.     Mouth/Throat:     Pharynx: Oropharynx is clear.  Eyes:     Pupils: Pupils are equal, round, and reactive to light.  Neck:     Vascular: JVD present.  Cardiovascular:     Rate and Rhythm: Regular rhythm. Bradycardia present.  Pulmonary:     Effort: No respiratory distress.     Breath sounds: No stridor. Rales present.  Abdominal:     General: There is no distension.  Musculoskeletal:     Cervical back: Normal range of motion.     Right lower leg: Edema present.     Left lower leg: Edema present.  Skin:    General: Skin is warm and dry.  Neurological:     General: No focal deficit present.     Mental Status: She is alert.     ED Results / Procedures / Treatments   Labs (all labs ordered are listed, but only abnormal results are displayed) Labs Reviewed  CBC WITH DIFFERENTIAL/PLATELET - Abnormal; Notable for the following  components:      Result Value   Monocytes Absolute 1.1 (*)    All other components within normal limits  BRAIN NATRIURETIC PEPTIDE - Abnormal; Notable for the following components:   B Natriuretic Peptide 1,702.9 (*)    All other components within normal limits  COMPREHENSIVE METABOLIC PANEL - Abnormal; Notable for the following components:   Sodium 131 (*)    Potassium 5.8 (*)    Glucose, Bld 126 (*)    BUN 56 (*)  Creatinine, Ser 1.65 (*)    Total Protein 6.2 (*)    ALT 64 (*)    GFR, Estimated 30 (*)    All other components within normal limits  TROPONIN I (HIGH SENSITIVITY) - Abnormal; Notable for the following components:   Troponin I (High Sensitivity) 30 (*)    All other components within normal limits  TROPONIN I (HIGH SENSITIVITY) - Abnormal; Notable for the following components:   Troponin I (High Sensitivity) 29 (*)    All other components within normal limits  RESPIRATORY PANEL BY RT PCR (FLU A&B, COVID)    EKG EKG Interpretation  Date/Time:  Thursday November 12 2019 23:49:58 EDT Ventricular Rate:  48 PR Interval:    QRS Duration: 94 QT Interval:  454 QTC Calculation: 405 R Axis:   62 Text Interpretation: Junctional rhythm Anterior infarct , age undetermined Abnormal ECG junctional rhythm replaces sinus rhythm Confirmed by Merrily Pew [phone]) on 11/13/2019 12:56:31 AM   Radiology DG Chest 2 View  Result Date: 11/13/2019 CLINICAL DATA:  Shortness of breath EXAM: CHEST - 2 VIEW COMPARISON:  January 28, 2012 FINDINGS: The heart size and mediastinal contours are borderline enlarged. There is prominence of the central pulmonary vasculature. The visualized skeletal structures are unremarkable. IMPRESSION: Mild cardiomegaly and pulmonary vascular congestion. Electronically Signed   By: Prudencio Pair M.D.   On: 11/13/2019 00:18    Procedures .Critical Care Performed by: Merrily Pew, MD Authorized by: Merrily Pew, MD   Critical care provider statement:     Critical care time (minutes):  45   Critical care was necessary to treat or prevent imminent or life-threatening deterioration of the following conditions:  Cardiac failure and circulatory failure   Critical care was time spent personally by me on the following activities:  Discussions with consultants, evaluation of patient's response to treatment, examination of patient, ordering and performing treatments and interventions, ordering and review of laboratory studies, ordering and review of radiographic studies, pulse oximetry, re-evaluation of patient's condition, obtaining history from patient or surrogate and review of old charts   (including critical care time)  Medications Ordered in ED Medications  calcium gluconate 1 g/ 50 mL sodium chloride IVPB (0 g Intravenous Stopped 11/13/19 0335)  furosemide (LASIX) injection 20 mg (20 mg Intravenous Given 11/13/19 0235)  ondansetron (ZOFRAN) injection 4 mg (4 mg Intravenous Given 11/13/19 0235)    ED Course  I have reviewed the triage vital signs and the nursing notes.  Pertinent labs & imaging results that were available during my care of the patient were reviewed by me and considered in my medical decision making (see chart for details).  Clinical Course as of Nov 13 431  Fri Nov 13, 2019  0043 EKG 12-Lead [MW]    Clinical Course User Index [MW] Chyrl Civatte, Student-PA   MDM Rules/Calculators/A&P                          I suspect that patient has bradycardia from the increased diltiazem dose and this is causing a acute heart failure.  We will go ahead and give her a dose of Lasix, calcium and Zofran.  I discussed with her cardiologist who recommends medicine admission for the same.  Her acute kidney injury is also likely related to decreased flow from the bradycardia and hopefully is improving by the same. Would suggest holding diltiazem and monitoring in hospital. hospitalist to admit for same.   Final Clinical Impression(s) / ED  Diagnoses Final diagnoses:  Bradycardia  Adverse effect of calcium-channel blocker, initial encounter  AKI (acute kidney injury) ([place])  Hypervolemia, unspecified hypervolemia type    Rx / DC Orders ED Discharge Orders    None       Cordelle Dahmen, Corene Cornea, MD 11/13/19 [phone]

## 2019-11-13 NOTE — ED Notes (Signed)
Patient sitting up in recliner for comfort, daughter remains at bedside.

## 2019-11-13 NOTE — H&P (Signed)
History and Physical    Donna [name] DTO:[id number] DOB: January 16, 1927 DOA: 11/12/2019  PCP: Gayland Curry, DO  Patient coming from: Home.  Chief Complaint: Shortness of breath.  HPI: Donna [name] is a 86 y.o. female with history of hypertension, aortic stenosis/mitral relation/mild LVOT who had an unremarkable cardiac cath in September 2021 has been experiencing shortness of breath over the last 3 days with occasional palpitations.  Patient did call her cardiologist who advised to take an extra dose of Cardizem.  Patient usually takes to 40 mg of Cardizem and took 1 more extra dose of 120 mg.  Patient was recently added on amlodipine for blood pressure control about 10 days ago.  Patient states that shortness of breath on exertion denies any shortness of breath on lying flat.  No chest pain had some nausea.  No abdominal pain or diarrhea.  Given the symptoms patient presents to the ER.  Patient was recently treated for UTI with Augmentin.  ED Course: In the ER patient is not hypoxic and is found to be bradycardic with a heart rate in the 40s.  EKG shows junctional bradycardia and on-call cardiologist Dr. Terri Skains is consulted and patient is being admitted for further work-up.  Given that shortness of breath chest x-ray showed mild congestion and patient is BNP is around 1700 high sensitive troponins were 30 and 29.  Labs are significant for acute renal failure with creatinine of 1.6 and potassium of 5.8 sodium 131.  AST is mildly elevated at 64.  Covid test is negative.  Review of Systems: As per HPI, rest all negative.   Past Medical History:  Diagnosis Date  . Aortic stenosis   . Hemorrhage of gastrointestinal tract, unspecified   . Hypertension   . Migraine   . Mitral regurgitation   . OA (osteoarthritis)   . Thyroid cyst     Past Surgical History:  Procedure Laterality Date  . CATARACT EXTRACTION  1990   rt  . CATARACT EXTRACTION  2005   left  . LEFT HEART CATH AND CORONARY  ANGIOGRAPHY N/A 10/06/2019   Procedure: LEFT HEART CATH AND CORONARY ANGIOGRAPHY;  Surgeon: Nigel Mormon, MD;  Location: [facility];  Service: Cardiovascular;  Laterality: N/A;  . REVISION TOTAL KNEE ARTHROPLASTY  2008  . THUMB ARTHROSCOPY    . TONSILLECTOMY       reports that she quit smoking about 71 years ago. Her smoking use included cigarettes. She quit after 2.00 years of use. She has never used smokeless tobacco. She reports previous alcohol use. She reports that she does not use drugs.  No Known Allergies  Family History  Problem Relation Age of Onset  . CVA Father 83  . Pancreatic cancer Mother   . CVA Sister 16  . Brain cancer Brother   . Arthritis Sister   . Heart disease Sister   . Breast cancer Neg Hx     Prior to Admission medications   Medication Sig Start Date End Date Taking? Authorizing Provider  amLODipine (NORVASC) 5 MG tablet Take 1 tablet (5 mg total) by mouth every evening. 11/03/19 02/01/20 Yes Tolia, Sunit, DO  Calcium Carbonate-Vitamin D 600-400 MG-UNIT per tablet Take 2 tablets by mouth daily.   Yes [provider]  Coenzyme Q10 (COQ10) 400 MG CAPS Take 400 mg by mouth daily.    Yes [provider]  diltiazem (CARDIZEM CD) 240 MG 24 hr capsule Take 1 capsule (240 mg total) by mouth  in the morning. 11/03/19 02/01/20 Yes Tolia, Sunit, DO  levothyroxine (SYNTHROID) 100 MCG tablet Take one tablet by mouth once daily 30 minutes before breakfast on empty stomach 10/28/19  Yes Eubanks, Carlos American, NP  losartan (COZAAR) 50 MG tablet Take 1 tablet (50 mg total) by mouth in the morning and at bedtime. 10/28/19  Yes [name], NP  Multiple Vitamins-Minerals (PRESERVISION AREDS 2) CAPS Take 1 capsule by mouth 2 (two) times a day.    Yes [provider]  OVER THE COUNTER MEDICATION Take 2 capsules by mouth daily. MitoQ   Yes [provider]  traMADol (ULTRAM) 50 MG tablet Take 0.5 tablets (25 mg total) by mouth  daily as needed for severe pain. 10/01/19  Yes Reed, Tiffany L, DO  vitamin B-12 (CYANOCOBALAMIN) 1000 MCG tablet Take 1 tablet (1,000 mcg total) by mouth daily. 03/07/17  Yes Reed, Tiffany L, DO  Blood Pressure Monitoring KIT Essential hypertension check bp daily 08/20/19   Gayland Curry, DO    Physical Exam: Constitutional: Moderately built and nourished. Vitals:   11/13/19 0315 11/13/19 0330 11/13/19 0345 11/13/19 0400  BP:    (!) 124/45  Pulse: (!) 52 (!) 49 (!) 54 (!) 50  Resp: 15 15 17 15   Temp:      TempSrc:      SpO2: 94% 92% 93% 92%   Eyes: Anicteric no pallor. ENMT: No discharge from the ears eyes nose or mouth. Neck: No mass felt.  No neck rigidity. Respiratory: No rhonchi or crepitations. Cardiovascular: S1-S2 heard. Abdomen: Soft nontender bowel sounds present. Musculoskeletal: No edema. Skin: No rash. Neurologic: Alert awake oriented to time place and person.  Moves all extremities. Psychiatric: Appears normal.  Normal affect.   Labs on Admission: I have personally reviewed following labs and imaging studies  CBC: Recent Labs  Lab 11/12/19 2352  WBC 9.9  NEUTROABS 7.1  HGB 12.3  HCT 38.4  MCV 93.0  PLT 169   Basic Metabolic Panel: Recent Labs  Lab 11/12/19 2352  NA 131*  K 5.8*  CL 100  CO2 23  GLUCOSE 126*  BUN 56*  CREATININE 1.65*  CALCIUM 10.2   GFR: Estimated Creatinine Clearance: 21.9 mL/min (A) (by C-G formula based on SCr of 1.65 mg/dL (H)). Liver Function Tests: Recent Labs  Lab 11/12/19 2352  AST 27  ALT 64*  ALKPHOS 47  BILITOT 0.8  PROT 6.2*  ALBUMIN 4.1   No results for input(s): LIPASE, AMYLASE in the last 168 hours. No results for input(s): AMMONIA in the last 168 hours. Coagulation Profile: No results for input(s): INR, PROTIME in the last 168 hours. Cardiac Enzymes: No results for input(s): CKTOTAL, CKMB, CKMBINDEX, TROPONINI in the last 168 hours. BNP (last 3 results) No results for input(s): PROBNP in the last  8760 hours. HbA1C: No results for input(s): HGBA1C in the last 72 hours. CBG: No results for input(s): GLUCAP in the last 168 hours. Lipid Profile: No results for input(s): CHOL, HDL, LDLCALC, TRIG, CHOLHDL, LDLDIRECT in the last 72 hours. Thyroid Function Tests: Recent Labs    11/12/19 1027  TSH 2.77   Anemia Panel: No results for input(s): VITAMINB12, FOLATE, FERRITIN, TIBC, IRON, RETICCTPCT in the last 72 hours. Urine analysis:    Component Value Date/Time   APPEARANCEUR Cloudy (A) 08/04/2014 0921   GLUCOSEU Negative 08/04/2014 0921   BILIRUBINUR Negative 10/28/2019 1429   BILIRUBINUR Negative 08/04/2014 0921   PROTEINUR Positive (A) 10/28/2019 1429   PROTEINUR 1+ (  A) 08/04/2014 0921   UROBILINOGEN 0.2 10/28/2019 1429   NITRITE Positive 10/28/2019 1429   NITRITE Positive (A) 08/04/2014 0921   LEUKOCYTESUR Large (3+) (A) 10/28/2019 1429   LEUKOCYTESUR 3+ (A) 08/04/2014 0921   Sepsis Labs: @LABRCNTIP (procalcitonin:4,lacticidven:4) ) Recent Results (from the past 240 hour(s))  Respiratory Panel by RT PCR (Flu A&B, Covid) - Nasopharyngeal Swab     Status: None   Collection Time: 11/13/19  3:30 AM   Specimen: Nasopharyngeal Swab  Result Value Ref Range Status   SARS Coronavirus 2 by RT PCR NEGATIVE NEGATIVE Final    Comment: (NOTE) SARS-CoV-2 target nucleic acids are NOT DETECTED.  The SARS-CoV-2 RNA is generally detectable in upper respiratoy specimens during the acute phase of infection. The lowest concentration of SARS-CoV-2 viral copies this assay can detect is 131 copies/mL. A negative result does not preclude SARS-Cov-2 infection and should not be used as the sole basis for treatment or other patient management decisions. A negative result may occur with  improper specimen collection/handling, submission of specimen other than nasopharyngeal swab, presence of viral mutation(s) within the areas targeted by this assay, and inadequate number of viral copies (<131  copies/mL). A negative result must be combined with clinical observations, patient history, and epidemiological information. The expected result is Negative.  Fact Sheet for Patients:  PinkCheek.be  Fact Sheet for Healthcare Providers:  GravelBags.it  This test is no t yet approved or cleared by the [place] FDA and  has been authorized for detection and/or diagnosis of SARS-CoV-2 by FDA under an Emergency Use Authorization (EUA). This EUA will remain  in effect (meaning this test can be used) for the duration of the COVID-19 declaration under Section 564(b)(1) of the Act, 21 U.S.C. section 360bbb-3(b)(1), unless the authorization is terminated or revoked sooner.     Influenza A by PCR NEGATIVE NEGATIVE Final   Influenza B by PCR NEGATIVE NEGATIVE Final    Comment: (NOTE) The Xpert Xpress SARS-CoV-2/FLU/RSV assay is intended as an aid in  the diagnosis of influenza from Nasopharyngeal swab specimens and  should not be used as a sole basis for treatment. Nasal washings and  aspirates are unacceptable for Xpert Xpress SARS-CoV-2/FLU/RSV  testing.  Fact Sheet for Patients: PinkCheek.be  Fact Sheet for Healthcare Providers: GravelBags.it  This test is not yet approved or cleared by the [place] FDA and  has been authorized for detection and/or diagnosis of SARS-CoV-2 by  FDA under an Emergency Use Authorization (EUA). This EUA will remain  in effect (meaning this test can be used) for the duration of the  Covid-19 declaration under Section 564(b)(1) of the Act, 21  U.S.C. section 360bbb-3(b)(1), unless the authorization is  terminated or revoked. Performed at [facility], [place] [address], [place], [place] [address]      Radiological Exams on Admission: DG Chest 2 View  Result Date: 11/13/2019 CLINICAL DATA:  Shortness of breath EXAM: CHEST - 2  VIEW COMPARISON:  January 28, 2012 FINDINGS: The heart size and mediastinal contours are borderline enlarged. There is prominence of the central pulmonary vasculature. The visualized skeletal structures are unremarkable. IMPRESSION: Mild cardiomegaly and pulmonary vascular congestion. Electronically Signed   By: Prudencio Pair M.D.   On: 11/13/2019 00:18    EKG: Independently reviewed.  Junctional bradycardia.  Assessment/Plan Principal Problem:   Bradycardia Active Problems:   Hypothyroidism   Essential hypertension, benign   Aortic valve stenosis   Severe mitral regurgitation   SVT (supraventricular tachycardia) ([facility])  1. Junctional bradycardia -patient was given calcium gluconate IV in the ER.  Will hold off patient's Cardizem and closely monitor.  Cardiology has been notified.  TSH is normal. 2. Acute renal failure with hyperkalemia we will hold off patient's ARB and at this time also hold off Lasix.  Patient was given calcium gluconate.  Will order 1 dose of Lokelma and follow metabolic panel. 3. Shortness of breath with history of aortic stenosis and mitral regurgitation with mild LVOT will await cardiology input.  On my exam patient is not in distress. 4. Hypothyroidism on Synthroid. 5. History of SVT presently bradycardic. 6. Hypertension presently on amlodipine holding of ARB and Cardizem due to renal failure and bradycardia.  7. Hyponatremia mild will need follow metabolic panel.    DVT prophylaxis: Lovenox. Code Status: DNR confirmed with patient's daughter. Family Communication: Patient's daughter. Disposition Plan: Home. Consults called: Cardiology. Admission status: Observation.   Rise Patience MD [facility] Pager [phone].  If 7PM-7AM, please contact night-coverage [email or web address] Password [email or web address]  11/13/2019, 5:50 AM

## 2019-11-13 NOTE — ED Notes (Signed)
C/o nausea MD sent a message

## 2019-11-14 LAB — BASIC METABOLIC PANEL
Anion gap: 6 (ref 5–15)
BUN: 49 mg/dL — ABNORMAL HIGH (ref 8–23)
CO2: 24 mmol/L (ref 22–32)
Calcium: 9.3 mg/dL (ref 8.9–10.3)
Chloride: 103 mmol/L (ref 98–111)
Creatinine, Ser: 1.45 mg/dL — ABNORMAL HIGH (ref 0.44–1.00)
GFR, Estimated: 34 mL/min — ABNORMAL LOW (ref >60)
Glucose, Bld: 96 mg/dL (ref 70–99)
Potassium: 5.1 mmol/L (ref 3.5–5.1)
Sodium: 133 mmol/L — ABNORMAL LOW (ref 135–145)

## 2019-11-14 LAB — MAGNESIUM: Magnesium: 2.1 mg/dL (ref 1.7–2.4)

## 2019-11-14 MED ORDER — DILTIAZEM HCL 30 MG PO TABS
30.0000 mg | ORAL_TABLET | Freq: Three times a day (TID) | ORAL | 1 refills | Status: DC
Start: 2019-11-14 — End: 2020-01-18

## 2019-11-14 MED ORDER — DILTIAZEM HCL 30 MG PO TABS
30.0000 mg | ORAL_TABLET | Freq: Three times a day (TID) | ORAL | 1 refills | Status: DC
Start: 2019-11-14 — End: 2019-11-14

## 2019-11-14 MED ORDER — FUROSEMIDE 10 MG/ML IJ SOLN
20.0000 mg | Freq: Once | INTRAMUSCULAR | Status: AC
  Administered 2019-11-14: 20 mg via INTRAVENOUS
  Filled 2019-11-14: qty 2

## 2019-11-14 NOTE — Discharge Summary (Signed)
Physician Discharge Summary  Donna [name] MLY:[id number] DOB: [birth year] DOA: 11/12/2019  PCP: Gayland Curry, DO  Admit date: 11/12/2019 Discharge date: 11/14/2019  Time spent: 18 minutes  Recommendations for Outpatient Follow-up:  1. Note dosage change of Cardizem to 30 mg 3 times daily and may be able to transition back to long-acting if kidney function continues to get better 2. Losartan discontinued this admission some 2/2 AKI/hyperkalemia and probably would not resume an outpatient 3. Need Chem-7 in about 1 week  Discharge Diagnoses:  Principal Problem:   Bradycardia Active Problems:   Hypothyroidism   Essential hypertension, benign   Aortic valve stenosis   Severe mitral regurgitation   SVT (supraventricular tachycardia) (HCC)   Bradycardia with 41-50 beats per minute   Discharge Condition: Improved  Diet recommendation: Heart healthy  Filed Weights   11/13/19 1344 11/14/19 0427  Weight: 71.8 kg 71 kg    History of present illness:  86 year old highly functional white female known heart disease SVT on  Holter, aortic stenosis mild LVOT unremarkable cath 10/05/2019 came to the hospital with palpitation and had recently had amlodipine added to meds Found to be bradycardic in the ED heart rate in the 40s EKG showed junctional bradycardia BNP was 1700 troponins 30 and 29 patient had prerenal azotemia creatinine 1.6 and was admitted for junctional bradycardia and AKI and hyperkalemia of 5.8  Patient was seen subsequently by cardiologist Dr. Chrisandra Carota creatinine had improved to 1.4 potassium down to 5.1 Bradycardia seem to have resolved and patient was placed on 3 times daily Cardizem 30 mg  It was felt that accumulation of extended component Cardizem may have been the etiology her heart rates were in the 80s to 90s Cardiology felt she could go home and she was discharged home on low-dose Cardizem as above She should have Chem-7 done in the outpatient setting in about 1  week   Discharge Exam: Vitals:   11/13/19 2140 11/14/19 0427  BP: (!) 137/48 (!) 133/53  Pulse: 78 81  Resp:  20  Temp: 98 F (36.7 C) 98 F (36.7 C)  SpO2:  98%    General: Awake coherent no distress EOMI NCAT no focal deficit Cardiovascular: S1-S2 no murmur no rub no gallop Respiratory: Clinically clear no added sound no rales no rhonchi Abdomen soft no rebound no guarding Neurologically intact I saw her walking in the hallway with good gait  Discharge Instructions   Discharge Instructions    Diet - low sodium heart healthy   Complete by: As directed    Discharge instructions   Complete by: As directed    Notice ur instructions to change to shorter acting Cardizem Please get labs in about 1 week Do not take your losartan going forward   Increase activity slowly   Complete by: As directed      Allergies as of 11/14/2019   No Known Allergies     Medication List    STOP taking these medications   diltiazem 240 MG 24 hr capsule Commonly known as: CARDIZEM CD   losartan 50 MG tablet Commonly known as: COZAAR     TAKE these medications   amLODipine 5 MG tablet Commonly known as: NORVASC Take 1 tablet (5 mg total) by mouth every evening.   Blood Pressure Monitoring Kit Essential hypertension check bp daily   Calcium Carbonate-Vitamin D 600-400 MG-UNIT tablet Take 2 tablets by mouth daily.   CoQ10 400 MG Caps Take 400 mg by mouth daily.   diltiazem 30  MG tablet Commonly known as: CARDIZEM Take 1 tablet (30 mg total) by mouth every 8 (eight) hours.   levothyroxine 100 MCG tablet Commonly known as: Synthroid Take one tablet by mouth once daily 30 minutes before breakfast on empty stomach   OVER THE COUNTER MEDICATION Take 2 capsules by mouth daily. MitoQ   PreserVision AREDS 2 Caps Take 1 capsule by mouth 2 (two) times a day.   traMADol 50 MG tablet Commonly known as: ULTRAM Take 0.5 tablets (25 mg total) by mouth daily as needed for severe  pain.   vitamin B-12 1000 MCG tablet Commonly known as: CYANOCOBALAMIN Take 1 tablet (1,000 mcg total) by mouth daily.      No Known Allergies    The results of significant diagnostics from this hospitalization (including imaging, microbiology, ancillary and laboratory) are listed below for reference.    Significant Diagnostic Studies: DG Chest 2 View  Result Date: 11/13/2019 CLINICAL DATA:  Shortness of breath EXAM: CHEST - 2 VIEW COMPARISON:  January 28, 2012 FINDINGS: The heart size and mediastinal contours are borderline enlarged. There is prominence of the central pulmonary vasculature. The visualized skeletal structures are unremarkable. IMPRESSION: Mild cardiomegaly and pulmonary vascular congestion. Electronically Signed   By: Prudencio Pair M.D.   On: 11/13/2019 00:18    Microbiology: Recent Results (from the past 240 hour(s))  Respiratory Panel by RT PCR (Flu A&B, Covid) - Nasopharyngeal Swab     Status: None   Collection Time: 11/13/19  3:30 AM   Specimen: Nasopharyngeal Swab  Result Value Ref Range Status   SARS Coronavirus 2 by RT PCR NEGATIVE NEGATIVE Final    Comment: (NOTE) SARS-CoV-2 target nucleic acids are NOT DETECTED.  The SARS-CoV-2 RNA is generally detectable in upper respiratoy specimens during the acute phase of infection. The lowest concentration of SARS-CoV-2 viral copies this assay can detect is 131 copies/mL. A negative result does not preclude SARS-Cov-2 infection and should not be used as the sole basis for treatment or other patient management decisions. A negative result may occur with  improper specimen collection/handling, submission of specimen other than nasopharyngeal swab, presence of viral mutation(s) within the areas targeted by this assay, and inadequate number of viral copies (<131 copies/mL). A negative result must be combined with clinical observations, patient history, and epidemiological information. The expected result is  Negative.  Fact Sheet for Patients:  PinkCheek.be  Fact Sheet for Healthcare Providers:  GravelBags.it  This test is no t yet approved or cleared by the [place] FDA and  has been authorized for detection and/or diagnosis of SARS-CoV-2 by FDA under an Emergency Use Authorization (EUA). This EUA will remain  in effect (meaning this test can be used) for the duration of the COVID-19 declaration under Section 564(b)(1) of the Act, 21 U.S.C. section 360bbb-3(b)(1), unless the authorization is terminated or revoked sooner.     Influenza A by PCR NEGATIVE NEGATIVE Final   Influenza B by PCR NEGATIVE NEGATIVE Final    Comment: (NOTE) The Xpert Xpress SARS-CoV-2/FLU/RSV assay is intended as an aid in  the diagnosis of influenza from Nasopharyngeal swab specimens and  should not be used as a sole basis for treatment. Nasal washings and  aspirates are unacceptable for Xpert Xpress SARS-CoV-2/FLU/RSV  testing.  Fact Sheet for Patients: PinkCheek.be  Fact Sheet for Healthcare Providers: GravelBags.it  This test is not yet approved or cleared by the [place] FDA and  has been authorized for detection and/or diagnosis of SARS-CoV-2 by  FDA under an Emergency Use Authorization (EUA). This EUA will remain  in effect (meaning this test can be used) for the duration of the  Covid-19 declaration under Section 564(b)(1) of the Act, 21  U.S.C. section 360bbb-3(b)(1), unless the authorization is  terminated or revoked. Performed at [facility], [place] [address], [place], [place] [address]      Labs: Basic Metabolic Panel: Recent Labs  Lab 11/12/19 2352 11/13/19 0547 11/14/19 0133  NA 131* 131* 133*  K 5.8* 6.0* 5.1  CL 100 101 103  CO2 23 21* 24  GLUCOSE 126* 111* 96  BUN 56* 58* 49*  CREATININE 1.65* 1.73* 1.45*  CALCIUM 10.2 10.1 9.3  MG  --   --  2.1    Liver Function Tests: Recent Labs  Lab 11/12/19 2352 11/13/19 0547  AST 27 22  ALT 64* 52*  ALKPHOS 47 41  BILITOT 0.8 1.1  PROT 6.2* 5.6*  ALBUMIN 4.1 3.7   No results for input(s): LIPASE, AMYLASE in the last 168 hours. No results for input(s): AMMONIA in the last 168 hours. CBC: Recent Labs  Lab 11/12/19 2352 11/13/19 0547  WBC 9.9 9.1  NEUTROABS 7.1 6.3  HGB 12.3 11.3*  HCT 38.4 35.1*  MCV 93.0 93.1  PLT 184 159   Cardiac Enzymes: No results for input(s): CKTOTAL, CKMB, CKMBINDEX, TROPONINI in the last 168 hours. BNP: BNP (last 3 results) Recent Labs    11/12/19 2353  BNP 1,702.9*    ProBNP (last 3 results) No results for input(s): PROBNP in the last 8760 hours.  CBG: No results for input(s): GLUCAP in the last 168 hours.     Signed:  Nita Sells MD   [facility] 11/14/2019, 1:03 PM

## 2019-11-14 NOTE — Discharge Instructions (Signed)

## 2019-11-14 NOTE — Plan of Care (Signed)

## 2019-11-14 NOTE — Progress Notes (Signed)
Progress Note  Patient Name: Donna [name] Date of Encounter: 11/14/2019  Attending physician: Rhetta Mura, MD Primary care provider: Kermit Balo, DO  Primary Cardiologist: Tessa Lerner, DO, [facility] Consultant:Braxton Weisbecker [place], DO, [facility]  Subjective: Donna [name] is a 86 y.o. female who was seen and examined at bedside at approximately 1130am. No events overnight. Patient states that she is feeling guards to less short of breath and lower extremity improved.  She has not experienced palpitations since yesterday.  Objective: Vital Signs in the last 24 hours: Temp:  [97.5 F (36.4 C)-98.2 F (36.8 C)] 98 F (36.7 C) (10/30 0427) Pulse Rate:  [61-81] 81 (10/30 0427) Resp:  [18-21] 20 (10/30 0427) BP: (133-146)/(47-60) 133/53 (10/30 0427) SpO2:  [82 %-99 %] 98 % (10/30 0427) Weight:  [71 kg-71.8 kg] 71 kg (10/30 0427)  Intake/Output:  Intake/Output Summary (Last 24 hours) at 11/14/2019 1213 Last data filed at 11/14/2019 0840 Gross per 24 hour  Intake 660 ml  Output 900 ml  Net -240 ml    Net IO Since Admission: -190 mL [11/14/19 1213]  Weights:  Filed Weights   11/13/19 1344 11/14/19 0427  Weight: 71.8 kg 71 kg    Telemetry: Personally reviewed.  Normal sinus rhythm without any significant ectopy.  Physical examination: PHYSICAL EXAM: Vitals with BMI 11/14/2019 11/13/2019 11/13/2019  Height - - -  Weight 156 lbs 8 oz - -  BMI 27.73 - -  Systolic 133 137 086  Diastolic 53 48 54  Pulse 81 78 -    CONSTITUTIONAL: Age-appropriate female, hemodynamically stable, no acute distress.    SKIN: Skin is warm and dry. No rash noted. No cyanosis. No pallor. No jaundice HEAD: Normocephalic and atraumatic.  EYES: No scleral icterus MOUTH/THROAT: Moist oral membranes.  NECK: No JVD present. No thyromegaly noted.  Bilateral carotid bruits left greater than right. LYMPHATIC: No visible cervical adenopathy.  CHEST Normal respiratory effort. No intercostal retractions   LUNGS:  Clear to auscultation bilaterally, no stridor. No wheezes. No rales.  CARDIOVASCULAR: Regular, positive S1-S2, soft systolic ejection murmur heard at the second intercostal space, soft holosystolic murmur heard at the apex, no gallops or rubs. ABDOMINAL: Soft, nontender, nondistended, left upper quadrants, no apparent ascites.  EXTREMITIES: No peripheral edema  HEMATOLOGIC: No significant bruising NEUROLOGIC: Oriented to person, place, and time. Nonfocal. Normal muscle tone.  PSYCHIATRIC: Normal mood and affect. Normal behavior. Cooperative  Lab Results: Hematology Recent Labs  Lab 11/12/19 2352 11/13/19 0547  WBC 9.9 9.1  RBC 4.13 3.77*  HGB 12.3 11.3*  HCT 38.4 35.1*  MCV 93.0 93.1  MCH 29.8 30.0  MCHC 32.0 32.2  RDW 13.6 13.7  PLT 184 159    Chemistry Recent Labs  Lab 11/12/19 2352 11/13/19 0547 11/14/19 0133  NA 131* 131* 133*  K 5.8* 6.0* 5.1  CL 100 101 103  CO2 23 21* 24  GLUCOSE 126* 111* 96  BUN 56* 58* 49*  CREATININE 1.65* 1.73* 1.45*  CALCIUM 10.2 10.1 9.3  PROT 6.2* 5.6*  --   ALBUMIN 4.1 3.7  --   AST 27 22  --   ALT 64* 52*  --   ALKPHOS 47 41  --   BILITOT 0.8 1.1  --   GFRNONAA 30* 28* 34*  ANIONGAP 8 9 6      Cardiac Enzymes: Cardiac Panel (last 3 results) Recent Labs    11/12/19 2352 11/13/19 0220 11/13/19 0547  TROPONINIHS 30* 29* 27*    BNP (last 3  results) Recent Labs    11/12/19 2353  BNP 1,702.9*    ProBNP (last 3 results) No results for input(s): PROBNP in the last 8760 hours.   DDimer No results for input(s): DDIMER in the last 168 hours.   Hemoglobin A1c:  Lab Results  Component Value Date   HGBA1C 5.1 08/22/2016   MPG 100 08/22/2016    TSH  Recent Labs    12/08/18 1004 06/04/19 0810 11/12/19 1027  TSH 4.58* 3.69 2.77    Lipid Panel     Component Value Date/Time   CHOL 112 12/08/2018 1004   CHOL 118 08/04/2014 0806   TRIG 80 12/08/2018 1004   HDL 34 (L) 12/08/2018 1004   HDL 34 (L)  08/04/2014 0806   CHOLHDL 3.3 12/08/2018 1004   VLDL 15 08/22/2016 0910   LDLCALC 62 12/08/2018 1004    Imaging: DG Chest 2 View  Result Date: 11/13/2019 CLINICAL DATA:  Shortness of breath EXAM: CHEST - 2 VIEW COMPARISON:  January 28, 2012 FINDINGS: The heart size and mediastinal contours are borderline enlarged. There is prominence of the central pulmonary vasculature. The visualized skeletal structures are unremarkable. IMPRESSION: Mild cardiomegaly and pulmonary vascular congestion. Electronically Signed   By: Jonna Clark M.D.   On: 11/13/2019 00:18    CARDIAC DATABASE: EKG: 08/26/2019:Sinus Rhythm, 67bpm, normal axis, PRWP, possible old inferior infarct, nonspecific ST-T changes in high lateral leads (I and avL).  11/12/2019: Junctional rhythm, 48 bpm, normal axis, without underlying injury pattern.   11/13/2019: Normal sinus rhythm, 76 bpm, normal axis, poor R wave progression, ST-T changes in the high lateral and lateral leads consider anterolateral ischemia, possible old inferior infarct.  Compared to prior EKG junctional rhythm his physician to normal sinus.  Patient has had prior similar ST-T changes compared to prior EKG 08/26/2019.  Echocardiogram: 09/09/2019:  Left ventricle cavity is normal in size. Severe concentric hypertrophy of the left ventricle. Normal global wall motion. Normal LV systolic function with visual EF 50-55%. Doppler evidence of grade I (impaired) diastolic dysfunction, normal LAP.  Left atrial cavity is severely dilated.  Moderate calcification. Mild aortic stenosis, in addition to mild LVOT obstruction. Aortic valve mean gradient of 18 mmHg, Vmax of 3.0 m/s. Calculated aortic valve area by continuity equation is 2 cm. Mild (grade I) aortic regurgitation. Moderate aortic stenosis. Moderate (Grade II) aortic regurgitation.  Severe (Grade III) mitral regurgitation.  Moderate tricuspid regurgitation. Estimated pulmonary artery systolic pressure 28 mmHg.    Stress Testing: None  Heart Catheterization: 10/06/2019: LM: Normal LAD: Mild luminal irregularities LCx: Normal RCA: Normal  LVEDP 14 mmHg 60 mmHg mid-cavitary gradient on pullback No significant LV-Ao gradient on pullback  Carotid artery duplex 09/09/2019:  Minimal stenosis in the right internal carotid artery (minimal).  Minimal stenosis in the left internal carotid artery (minimal).  Minimal plaque noted in the CCA bilaterally without significant stenosis.  Antegrade right vertebral artery flow. Antegrade left vertebral artery flow.  Follow up studies if clinically indicated.  7 day extended Holter monitor: Predominantly normal sinus rhythm (NSR).  HR 40-210 bpm. Avg HR 79 bpm. Minimum HR 40 bpm on 08/29/19 at 1:02pm sinus with junctional escape. No atrial fibrillation/NSVT/pause (3 secs or longer). Total ventricular ectopic burden <1%. Total supraventricular ectopic burden <1%. 42 episodes of SVT. Fastest and longest run was 1hr 17 mins with max HR 210 bpm (avg 155 bpm). Patient triggered events: 6. 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  Scheduled Meds: . amLODipine  5 mg Oral QPM  . diltiazem  30 mg Oral Q8H  . enoxaparin (LOVENOX) injection  30 mg Subcutaneous Q24H  . furosemide  20 mg Intravenous Once  . levothyroxine  100 mcg Oral Q0600  . vitamin B-12  1,000 mcg Oral Daily   PRN Meds: acetaminophen **OR** acetaminophen   IMPRESSION & RECOMMENDATIONS: Jessabelle Markiewicz is a 86 y.o. female whose past medical history and cardiovascular risk factors include:  Hypertension, supraventricular tachycardia, mid cavitary LVOT obstruction, mitral regurgitation, history of GI bleeding, postmenopausal female, advanced age.  Palpitations: Resolved  Given her symptoms of palpitation, history of supraventricular tachycardia, mid cavitary gradient of 60 mmHg on pullback for invasive angiography would  recommend continuation Cardizem.  Discontinue extended Cardizem at the time of discharge.  Continue Cardizem to 30 mg p.o. 3 times daily.  Telemetry reviewed, continue   Patient junctional rhythm has resolved and no recurrence as per telemetry review.    Junctional rhythm:  Resolved  Currently normal sinus rhythm on telemetry.  Acute diastolic heart failure, stage C, NYHA class II: Improving  On presentation patient was having shortness of breath with effort related activities, chest x-ray noted vascular congestion, elevated BNP, crackles on physical examination, and weight gain.  Serum creatinine improving but not at baseline.  Administer Lasix 20 mg IV push x1  Strict I's and O's, daily weights.  Hold ARB secondary to acute kidney injury.  We will slowly uptitrate guideline directed medical therapy.  Patient has undergone ischemic evaluation.  Recently including an echocardiogram, stress test, and heart catheterization.  No additional testing recommended at this time.  Acute kidney injury on chronic kidney disease: Improving  Currently managed by primary team.  Secondary Diagnosis:  Benign essential hypertension: continue hypertensive medications.  Monitor BP.  Stable   Mitral regurgitation, severe: Stable.  Monitor to now.    Hyperkalemia: Resolved, currently managed by primary team.  Time spent: 35 minute.   Patient's questions and concerns were addressed to her satisfaction. She voices understanding of the instructions provided during this encounter.  Reached out to the primary team to discuss her care, awaiting callback.  From a cardiac standpoint, patient can be discharged home with close follow-up in the office.  Would recommend discontinuation of the extended release Cardizem at the time of discharge.  She is can go home on Cardizem 30 mg p.o. 3 times daily.  Continue the management of her underlying acute kidney injury and other comorbid conditions as  per your care.  This note was created using a voice recognition software as a result there may be grammatical errors inadvertently enclosed that do not reflect the nature of this encounter. Every attempt is made to correct such errors.  Tessa Lerner, DO, [facility] Office: [phone] 11/14/2019, 12:13 PM

## 2019-11-16 ENCOUNTER — Telehealth: Payer: Self-pay | Admitting: *Deleted

## 2019-11-16 NOTE — Telephone Encounter (Signed)
Transition Care Management Follow-Up Telephone Call   Date discharged and where:11/14/2019 [place]  How have you been since you were released from the hospital? tired  Any patient concerns? No  Items Reviewed:   Meds: Yes  Allergies:Yes  Dietary Changes Reviewed:Yes  Functional Questionnaire:  Independent-I Dependent-D  ADLs:I   Dressing- I    Eating-I   Maintaining continence-I   Transferring-I   Transportation-I   Meal Prep-I   Managing Meds- I  Confirmed importance and Date/Time of follow-up visits scheduled:11/23/2019 with Dr. Renato Gails   Confirmed with patient if condition worsens to call PCP or go to the Emergency Dept. Patient was given office number and encouraged to call back with questions or concerns: Yes

## 2019-11-17 ENCOUNTER — Encounter: Payer: Self-pay | Admitting: Cardiology

## 2019-11-17 ENCOUNTER — Ambulatory Visit: Payer: Medicare PPO | Admitting: Cardiology

## 2019-11-17 ENCOUNTER — Other Ambulatory Visit: Payer: Self-pay

## 2019-11-17 VITALS — BP 162/62 | HR 71 | Ht 63.0 in | Wt 156.0 lb

## 2019-11-17 DIAGNOSIS — I471 Supraventricular tachycardia: Secondary | ICD-10-CM

## 2019-11-17 DIAGNOSIS — R0602 Shortness of breath: Secondary | ICD-10-CM

## 2019-11-17 DIAGNOSIS — I10 Essential (primary) hypertension: Secondary | ICD-10-CM

## 2019-11-17 DIAGNOSIS — R002 Palpitations: Secondary | ICD-10-CM

## 2019-11-17 DIAGNOSIS — Z09 Encounter for follow-up examination after completed treatment for conditions other than malignant neoplasm: Secondary | ICD-10-CM

## 2019-11-17 DIAGNOSIS — Z87891 Personal history of nicotine dependence: Secondary | ICD-10-CM

## 2019-11-17 DIAGNOSIS — N179 Acute kidney failure, unspecified: Secondary | ICD-10-CM

## 2019-11-17 MED ORDER — AMLODIPINE BESYLATE 10 MG PO TABS: 10.0000 mg | ORAL_TABLET | Freq: Every evening | ORAL | 0 refills | Status: DC

## 2019-11-17 NOTE — Progress Notes (Signed)
Date:  10/01/2019  ID:  Donna [name], DOB 08-04-1927, MRN [id number]  PCP:  Gayland Curry, DO  Cardiologist:  Rex Kras, DO, [facility] (established care 08/26/2019)  Date: 11/17/19 Last Office Visit: 11/03/2019  Chief Complaint  Patient presents with  . Palpitations  . Shortness of Breath  . Hospitalization Follow-up    HPI  Donna [name] is a 86 y.o. female who presents to the office with a chief complaint of " hospital follow-up, reevaluation of palpitations and shortness of breath." Patient's past medical history and cardiovascular risk factors include: hypertension, supraventricular tachycardia, moderate aortic stenosis, mild LVOT obstruction, severe MR, migraine, GI bleeding, postmenopausal female, advanced age.  Patient is accompanied by her daughter Donna [name]) at today's visit.  She is referred to the office at the request of her primary care provider for evaluation and management palpitations.  In the work-up of palpitations patient underwent an echocardiogram, stress test, and 14-day extended Holter monitor.  Given the continued symptoms of effort related dyspnea and chest pain she underwent a left heart catheterization which noted no significant epicardial coronary artery disease, no significant gradient on pullback, but had a 98mHg mid cavitary gradient.  Given her symptoms of palpitations, SVT on the monitor, and mid cavitary gradient patient was started on calcium channel blocker and uptitrated for symptom management.  Beta-blockers not chosen as patient had recently lost her husband and was still grieving and did not want to contribute to her mood/depression.  Since last office visit patient had an episode of shortness of breath, heart pounding, and bradycardia.  [facility].  She was found to be in junctional rhythm on presentation and acute kidney injury after recent urinary tract infection as well.  Patient was managed  medically in the hospital and discharged home safely.  At the time of discharge her Cardizem extended release was changed to 30 mg p.o. 3 times daily, losartan was held secondary to acute kidney injury which was improving.  She now presents for follow-up.  Since discharge patient states that she no longer has palpitations and her shortness of breath is improved by approximately 90%.  She is doing well on the new dose of Cardizem and has not experienced bradycardia. Her BP log reviewed her home morning BP are elevated compared to evening.   FUNCTIONAL STATUS: No structured exercise program or daily routine. 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    ALLERGIES: No Known Allergies  MEDICATION LIST PRIOR TO VISIT: Current Meds  Medication Sig  . amLODipine (NORVASC) 10 MG tablet Take 1 tablet (10 mg total) by mouth every evening.  . Blood Pressure Monitoring KIT Essential hypertension check bp daily  . Calcium Carbonate-Vitamin D 600-400 MG-UNIT per tablet Take 2 tablets by mouth daily.  . Coenzyme Q10 (COQ10) 400 MG CAPS Take 400 mg by mouth daily.   .Marland Kitchendiltiazem (CARDIZEM) 30 MG tablet Take 1 tablet (30 mg total) by mouth every 8 (eight) hours.  .Marland Kitchenlevothyroxine (SYNTHROID) 100 MCG tablet Take one tablet by mouth once daily 30 minutes before breakfast on empty stomach  . Multiple Vitamins-Minerals (PRESERVISION AREDS 2) CAPS Take 1 capsule by mouth 2 (two) times a day.   .Marland KitchenOVER THE COUNTER MEDICATION Take 2 capsules by mouth daily. MitoQ  . traMADol (ULTRAM) 50 MG tablet Take 0.5 tablets (25 mg total) by mouth daily as needed for severe pain.  . vitamin B-12 (CYANOCOBALAMIN)  1000 MCG tablet Take 1 tablet (1,000 mcg total) by mouth daily.  . [DISCONTINUED] amLODipine (NORVASC) 5 MG tablet Take 1 tablet (5 mg total) by mouth every evening.     PAST MEDICAL HISTORY: Past Medical History:  Diagnosis Date  . Aortic stenosis   . Hemorrhage of gastrointestinal tract,  unspecified   . Hypertension   . Migraine   . Mitral regurgitation   . OA (osteoarthritis)   . Thyroid cyst     PAST SURGICAL HISTORY: Past Surgical History:  Procedure Laterality Date  . CATARACT EXTRACTION  1990   rt  . CATARACT EXTRACTION  2005   left  . LEFT HEART CATH AND CORONARY ANGIOGRAPHY N/A 10/06/2019   Procedure: LEFT HEART CATH AND CORONARY ANGIOGRAPHY;  Surgeon: Nigel Mormon, MD;  Location: [facility];  Service: Cardiovascular;  Laterality: N/A;  . REVISION TOTAL KNEE ARTHROPLASTY  2008  . THUMB ARTHROSCOPY    . TONSILLECTOMY      FAMILY HISTORY: The patient family history includes Arthritis in her sister; Brain cancer in her brother; CVA (age of onset: 16) in her sister; CVA (age of onset: 25) in her father; Heart disease in her sister; Pancreatic cancer in her mother.  SOCIAL HISTORY:  The patient  reports that she quit smoking about 71 years ago. Her smoking use included cigarettes. She quit after 2.00 years of use. She has never used smokeless tobacco. She reports previous alcohol use. She reports that she does not use drugs.  REVIEW OF SYSTEMS: Review of Systems  Constitutional: Negative for chills and fever.  HENT: Negative for hoarse voice and nosebleeds.   Eyes: Negative for discharge, double vision and pain.  Cardiovascular: Negative for chest pain, claudication, dyspnea on exertion, leg swelling, near-syncope, orthopnea, palpitations, paroxysmal nocturnal dyspnea and syncope.  Respiratory: Positive for shortness of breath. Negative for hemoptysis.   Musculoskeletal: Negative for muscle cramps and myalgias.  Gastrointestinal: Negative for abdominal pain, constipation, diarrhea, hematemesis, hematochezia, melena, nausea and vomiting.  Neurological: Negative for dizziness, light-headedness and vertigo.    PHYSICAL EXAM: Vitals with BMI 11/17/2019 11/14/2019 11/14/2019  Height 5' 3"  - -  Weight 156 lbs - -  BMI 10.93 - -  Systolic 235 573  220  Diastolic 62 58 58  Pulse 71 - -   CONSTITUTIONAL: Well-developed and well-nourished. No acute distress.  SKIN: Skin is warm and dry. No rash noted. No cyanosis. No pallor. No jaundice HEAD: Normocephalic and atraumatic.  EYES: No scleral icterus MOUTH/THROAT: Moist oral membranes.  NECK: No JVD present. No thyromegaly noted.  Bilateral carotid bruits most likely secondary to delayed carotid upstrokes LYMPHATIC: No visible cervical adenopathy.  CHEST Normal respiratory effort. No intercostal retractions  LUNGS: Clear to auscultation bilaterally.  No stridor. No wheezes. No rales.  CARDIOVASCULAR: Regular, positive S1 delayed S2, 3 out of 6 crescendo decrescendo murmur heard at the second right intercostal space, with a component of holosystolic murmur at the apex. ABDOMINAL: Soft, nontender, nondistended, no apparent ascites.  EXTREMITIES: Trace bilateral peripheral edema.  HEMATOLOGIC: No significant bruising NEUROLOGIC: Oriented to person, place, and time. Nonfocal. Normal muscle tone.  PSYCHIATRIC: Normal mood and affect. Normal behavior. Cooperative  CARDIAC DATABASE: EKG: 08/26/2019:Sinus  Rhythm, 67bpm, normal axis, PRWP, possible old inferior infarct, nonspecific ST-T changes in high lateral leads (I and avL).  Echocardiogram: 09/09/2019:  Left ventricle cavity is normal in size. Severe concentric hypertrophy of the left ventricle. Normal global wall motion. Normal LV systolic function with visual  EF 50-55%. Doppler evidence of grade I (impaired) diastolic dysfunction, normal LAP.  Left atrial cavity is severely dilated.  Unable to clearly determine number of leaflets due to off axis view.  Moderate calcification. Mild aortic stenosis, in addition to mild LVOT obstruction. Aortic valve mean gradient of 18 mmHg, Vmax of 3.0 m/s. Calculated aortic valve area by continuity equation is 2 cm. Mild (grade I) aortic regurgitation. Moderate aortic stenosis. Moderate (Grade II)  aortic regurgitation.  Severe (Grade III) mitral regurgitation.  Moderate tricuspid regurgitation. Estimated pulmonary artery systolic pressure 28 mmHg.   Stress Testing: None  Heart Catheterization: 10/06/2019: LM: Normal LAD: Mild luminal irregularities LCx: Normal RCA: Normal  LVEDP 14 mmHg 60 mmHg mid-cavitary gradient on pullback No significant LV-Ao gradient on pullback  Carotid artery duplex 09/09/2019:  Minimal stenosis in the right internal carotid artery (minimal).  Minimal stenosis in the left internal carotid artery (minimal).  Minimal plaque noted in the CCA bilaterally without significant stenosis.  Antegrade right vertebral artery flow. Antegrade left vertebral artery flow.  Follow up studies if clinically indicated.  7 day extended Holter monitor: Predominantly normal sinus rhythm (NSR).  HR 40-210 bpm.  Avg HR 79 bpm. Minimum HR 40 bpm on 08/29/19 at 1:02pm sinus with junctional escape. No atrial fibrillation/NSVT/pause (3 secs or longer). Total ventricular ectopic burden <1%. Total supraventricular ectopic burden <1%. 42 episodes of SVT.  Fastest and longest run was 1hr 17 mins with max HR 210 bpm (avg 155 bpm). Patient triggered events: 6.  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   LABORATORY DATA: CBC Latest Ref Rng & Units 11/13/2019 11/12/2019 10/02/2019  WBC 4.0 - 10.5 K/uL 9.1 9.9 4.9  Hemoglobin 12.0 - 15.0 g/dL 11.3(L) 12.3 13.4  Hematocrit 36 - 46 % 35.1(L) 38.4 38.6  Platelets 150 - 400 K/uL 159 184 150    CMP Latest Ref Rng & Units 11/14/2019 11/13/2019 11/12/2019  Glucose 70 - 99 mg/dL 96 111(H) 126(H)  BUN 8 - 23 mg/dL 49(H) 58(H) 56(H)  Creatinine 0.44 - 1.00 mg/dL 1.45(H) 1.73(H) 1.65(H)  Sodium 135 - 145 mmol/L 133(L) 131(L) 131(L)  Potassium 3.5 - 5.1 mmol/L 5.1 6.0(H) 5.8(H)  Chloride 98 - 111 mmol/L 103 101 100  CO2 22 - 32 mmol/L 24 21(L) 23  Calcium 8.9 - 10.3 mg/dL  9.3 10.1 10.2  Total Protein 6.5 - 8.1 g/dL - 5.6(L) 6.2(L)  Total Bilirubin 0.3 - 1.2 mg/dL - 1.1 0.8  Alkaline Phos 38 - 126 U/L - 41 47  AST 15 - 41 U/L - 22 27  ALT 0 - 44 U/L - 52(H) 64(H)    Lipid Panel     Component Value Date/Time   CHOL 112 12/08/2018 1004   CHOL 118 08/04/2014 0806   TRIG 80 12/08/2018 1004   HDL 34 (L) 12/08/2018 1004   HDL 34 (L) 08/04/2014 0806   CHOLHDL 3.3 12/08/2018 1004   VLDL 15 08/22/2016 0910   LDLCALC 62 12/08/2018 1004   LABVLDL 15 08/04/2014 0806    No components found for: NTPROBNP No results for input(s): PROBNP in the last 8760 hours. Recent Labs    12/08/18 1004 06/04/19 0810 11/12/19 1027  TSH 4.58* 3.69 2.77    BMP Recent Labs    12/08/18 1004 04/02/19 0821 06/04/19 0810 06/04/19 0810 10/06/19 1303 10/06/19 1303 11/12/19 2352 11/13/19 0547 11/14/19 0133  NA 136   < > 137   < > 136   < >  131* 131* 133*  K 4.8   < > 5.0   < > 4.9   < > 5.8* 6.0* 5.1  CL 102   < > 102   < > 103   < > 100 101 103  CO2 28   < > 28   < > 24   < > 23 21* 24  GLUCOSE 86   < > 99   < > 91   < > 126* 111* 96  BUN 21   < > 15   < > 13   < > 56* 58* 49*  CREATININE 0.86   < > 0.89*  --  0.74   < > 1.65* 1.73* 1.45*  CALCIUM 10.0   < > 10.3   < > 9.5   < > 10.2 10.1 9.3  GFRNONAA 60  --  57*  --  >60  --  30* 28* 34*  GFRAA 70  --  67  --  >60  --   --   --   --    < > = values in this interval not displayed.    HEMOGLOBIN A1C Lab Results  Component Value Date   HGBA1C 5.1 08/22/2016   MPG 100 08/22/2016    IMPRESSION:    ICD-10-CM   1. Hospital discharge follow-up  Z09   2. Palpitations  R00.2 Ambulatory referral to Cardiac Electrophysiology  3. Paroxysmal SVT (supraventricular tachycardia) (HCC)  I47.1 Ambulatory referral to Cardiac Electrophysiology  4. Shortness of breath  R06.02 Ambulatory referral to Cardiac Electrophysiology  5. Essential hypertension, benign  I10 amLODipine (NORVASC) 10 MG tablet  6. Former smoker   Z87.891   66. AKI (acute kidney injury) ([place])  S85.4 Basic metabolic panel    Magnesium     RECOMMENDATIONS: Kamaljit Hizer is a 86 y.o. female whose past medical history and cardiac risk factors include: hypertension, supraventricular tachycardia, aortic stenosis, Mid cavitary gradient, severe MR, migraine, GI bleeding, postmenopausal female, advanced age.  Supraventricular tachycardia:  Continue Cardizem 30 mg p.o. 3 times daily.    Shared decision was to proceed with EP evaluation for symptomatic SVT.  Will refer to EP for their expertise given her symptoms of shortness of breath, episodic palpitations, and monitor results concerning for SVT possibly AVNRT as well.   Aortic stenosis:  Patient is scheduled for another echocardiogram in February 2022 to reevaluate the severity and progression of aortic stenosis at [facility].  No significant LV-AO gradient on pullback via invasive angiography  Patient is aware to seek medical attention if she has symptoms of chest pain, syncope, heart failure.  Severe MR:  Increase Norvasc 10 mg p.o. p.m.  Hold losartan 50 mg p.o. twice daily, until Cr is back to baseline. Will re-check BMP on or after 11/19/2019.  Continue to monitor.  Acute kidney injury: Improving.  Hold losartan 50 mg p.o. twice daily, until Cr is back to baseline. Will re-check BMP on or after 11/19/2019.  Former smoker: Educated on the importance of continued smoking cessation.  FINAL MEDICATION LIST END OF ENCOUNTER: Meds ordered this encounter  Medications  . amLODipine (NORVASC) 10 MG tablet    Sig: Take 1 tablet (10 mg total) by mouth every evening.    Dispense:  90 tablet    Refill:  0    Medications Discontinued During This Encounter  Medication Reason  . amLODipine (NORVASC) 5 MG tablet      Current Outpatient Medications:  .  amLODipine (NORVASC)  10 MG tablet, Take 1 tablet (10 mg total) by mouth every evening., Disp: 90 tablet, Rfl: 0 .  Blood  Pressure Monitoring KIT, Essential hypertension check bp daily, Disp: 1 kit, Rfl: 0 .  Calcium Carbonate-Vitamin D 600-400 MG-UNIT per tablet, Take 2 tablets by mouth daily., Disp: , Rfl:  .  Coenzyme Q10 (COQ10) 400 MG CAPS, Take 400 mg by mouth daily. , Disp: , Rfl:  .  diltiazem (CARDIZEM) 30 MG tablet, Take 1 tablet (30 mg total) by mouth every 8 (eight) hours., Disp: 90 tablet, Rfl: 1 .  levothyroxine (SYNTHROID) 100 MCG tablet, Take one tablet by mouth once daily 30 minutes before breakfast on empty stomach, Disp: 90 tablet, Rfl: 1 .  Multiple Vitamins-Minerals (PRESERVISION AREDS 2) CAPS, Take 1 capsule by mouth 2 (two) times a day. , Disp: , Rfl:  .  OVER THE COUNTER MEDICATION, Take 2 capsules by mouth daily. MitoQ, Disp: , Rfl:  .  traMADol (ULTRAM) 50 MG tablet, Take 0.5 tablets (25 mg total) by mouth daily as needed for severe pain., Disp: 15 tablet, Rfl: 0 .  vitamin B-12 (CYANOCOBALAMIN) 1000 MCG tablet, Take 1 tablet (1,000 mcg total) by mouth daily., Disp: 30 tablet, Rfl: 5  Orders Placed This Encounter  Procedures  . Basic metabolic panel  . Magnesium  . Ambulatory referral to Cardiac Electrophysiology   There are no Patient Instructions on file for this visit.   --Continue cardiac medications as reconciled in final medication list. --Return in about 3 months (around 02/17/2020) for Reevaluation of, Dyspnea,palpitation. . Or sooner if needed. --Continue follow-up with your primary care physician regarding the management of your other chronic comorbid conditions.  Patient's questions and concerns were addressed to her satisfaction. She voices understanding of the instructions provided during this encounter.   This note was created using a voice recognition software as a result there may be grammatical errors inadvertently enclosed that do not reflect the nature of this encounter. Every attempt is made to correct such errors.  Rex Kras, [place], [facility]  Pager:  [phone] Office: [phone]

## 2019-11-19 DIAGNOSIS — I209 Angina pectoris, unspecified: Secondary | ICD-10-CM | POA: Diagnosis not present

## 2019-11-19 DIAGNOSIS — I10 Essential (primary) hypertension: Secondary | ICD-10-CM | POA: Diagnosis not present

## 2019-11-20 ENCOUNTER — Other Ambulatory Visit: Payer: Self-pay

## 2019-11-20 LAB — MAGNESIUM: Magnesium: 2.1 mg/dL (ref 1.6–2.3)

## 2019-11-20 LAB — BASIC METABOLIC PANEL
BUN/Creatinine Ratio: 21 (ref 12–28)
BUN: 20 mg/dL (ref 8–27)
CO2: 20 mmol/L (ref 20–29)
Calcium: 10.5 mg/dL — ABNORMAL HIGH (ref 8.7–10.3)
Chloride: 98 mmol/L (ref 96–106)
Creatinine, Ser: 0.95 mg/dL (ref 0.57–1.00)
GFR calc Af Amer: 61 mL/min/{1.73_m2} (ref >59)
GFR calc non Af Amer: 53 mL/min/{1.73_m2} — ABNORMAL LOW (ref >59)
Glucose: 87 mg/dL (ref 65–99)
Potassium: 5.3 mmol/L — ABNORMAL HIGH (ref 3.5–5.2)
Sodium: 137 mmol/L (ref 134–144)

## 2019-11-20 NOTE — Progress Notes (Signed)
Please have her reduce it to every other day.

## 2019-11-20 NOTE — Patient Outreach (Signed)
[facility]) Care Management  11/20/2019  Desha Bitner Nov 20, 1927 [id number]    EMMI-General Discharge RED ON EMMI ALERT Day # 4 Date: 11/19/2019 Red Alert Reason: "Other questions/problems? Yes"   Outreach attempt # 1 to patient. No answer. RN CM left HIPAA compliant voicemail message along with contact info.        Plan: RN CM will make outreach attempt to patient within 3-4 business days. RN CM will send unsuccessful outreach letter to patient.  Antionette Fairy, RN,BSN,CCM [facility] Care Management Telephonic Care Management Coordinator Direct Phone: [phone] Toll Free: [phone] Fax: [phone]

## 2019-11-20 NOTE — Patient Outreach (Signed)
[facility]) Care Management  11/20/2019  Donna [name] 20-Nov-1927 [id number]   EMMI-General Discharge RED ON EMMI ALERT Day # 4 Date: 11/19/2019 Red Alert Reason: "Other questions/problems? Yes"   Incoming call from patient returning RN CM. Patient reports she is doing and feeling well since returning home. RN Cm reviewed and addressed red alert with patient. She reports that she responded no but the machine did not hear her correctly. She denies any issues. She confirms she has her meds in the home and no issues regarding them. She has MD follow up appt in place. She denies any RN CM  needs or concerns at present. She has completed post discharge automated EMMI calls.    Plan: RN CM will close case at this time.   Antionette Fairy, RN,BSN,CCM [facility] Care Management Telephonic Care Management Coordinator Direct Phone: [phone] Toll Free: [phone] Fax: [phone]

## 2019-11-23 ENCOUNTER — Other Ambulatory Visit: Payer: Self-pay

## 2019-11-23 ENCOUNTER — Encounter: Payer: Self-pay | Admitting: Internal Medicine

## 2019-11-23 ENCOUNTER — Ambulatory Visit (INDEPENDENT_AMBULATORY_CARE_PROVIDER_SITE_OTHER): Payer: Medicare PPO | Admitting: Internal Medicine

## 2019-11-23 VITALS — BP 118/62 | HR 75 | Temp 97.7°F | Ht 63.0 in | Wt 153.4 lb

## 2019-11-23 DIAGNOSIS — I471 Supraventricular tachycardia, unspecified: Secondary | ICD-10-CM

## 2019-11-23 DIAGNOSIS — R0600 Dyspnea, unspecified: Secondary | ICD-10-CM | POA: Diagnosis not present

## 2019-11-23 DIAGNOSIS — E875 Hyperkalemia: Secondary | ICD-10-CM

## 2019-11-23 DIAGNOSIS — N179 Acute kidney failure, unspecified: Secondary | ICD-10-CM

## 2019-11-23 DIAGNOSIS — I350 Nonrheumatic aortic (valve) stenosis: Secondary | ICD-10-CM

## 2019-11-23 DIAGNOSIS — R0609 Other forms of dyspnea: Secondary | ICD-10-CM

## 2019-11-23 DIAGNOSIS — I340 Nonrheumatic mitral (valve) insufficiency: Secondary | ICD-10-CM

## 2019-11-23 NOTE — Progress Notes (Signed)
Called pt no answer, left a vm

## 2019-11-23 NOTE — Progress Notes (Signed)
 Location:  [facility] Provider: Tiffany L. Reed, D.O., C.M.D.  Code Status: DNR Goals of Care:  Advanced Directives 11/23/2019  Does Patient Have a Medical Advance Directive? Yes  Type of Advance Directive Healthcare Power of Attorney;Out of facility DNR (pink MOST or yellow form)  Does patient want to make changes to medical advance directive? No - Patient declined  Copy of Healthcare Power of Attorney in Chart? Yes - validated most recent copy scanned in chart (See row information)  Pre-existing out of facility DNR order (yellow form or pink MOST form) Pink MOST/Yellow Form most recent copy in chart - Physician notified to receive inpatient order     Chief Complaint  Patient presents with  . Transitions Of Care    Discharge date 11/14/2019    HPI: Patient is a 86 y.o. female seen today for hospital follow-up s/p admission from 10/28-10/30/21 with bradycardia.   She went to the hospital for palpitations and sob.  Her HR had dropped into the 40s and she was retaining fluid.  They determined she was taking too much cardizem and the kidneys could not accomodate it.  She's now on 90mg total per day.  She's lost all of her fluid.  Had gone up to 158 from 153lbs.    Says her spirits are ok.  Has a blue day now and then.    She still had some hyperkalemia and has reduced her bananas.  She remains off losartan and taking tow amlodipine instead of one.  Dr. Taylor is going to evaluate her next week to see if she needs a monitor for her SVT.  She's not had palpitations since her hospital stay.  Most of last week, she was sob.  Friday, thinks were back to normal.  Only short of breath if REALLY exerts herself.  Past Medical History:  Diagnosis Date  . Aortic stenosis   . Hemorrhage of gastrointestinal tract, unspecified   . Hypertension   . Migraine   . Mitral regurgitation   . OA (osteoarthritis)   . Thyroid cyst     Past Surgical History:  Procedure Laterality Date  . CATARACT  EXTRACTION  1990   rt  . CATARACT EXTRACTION  2005   left  . LEFT HEART CATH AND CORONARY ANGIOGRAPHY N/A 10/06/2019   Procedure: LEFT HEART CATH AND CORONARY ANGIOGRAPHY;  Surgeon: Patwardhan, Manish J, MD;  Location: [facility];  Service: Cardiovascular;  Laterality: N/A;  . REVISION TOTAL KNEE ARTHROPLASTY  2008  . THUMB ARTHROSCOPY    . TONSILLECTOMY      No Known Allergies  Outpatient Encounter Medications as of 11/23/2019  Medication Sig  . amLODipine (NORVASC) 10 MG tablet Take 10 mg by mouth in the morning and at bedtime.  . Blood Pressure Monitoring KIT Essential hypertension check bp daily  . Calcium Carbonate-Vitamin D 600-400 MG-UNIT per tablet Take 2 tablets by mouth daily.  . Coenzyme Q10 (COQ10) 400 MG CAPS Take 400 mg by mouth daily.   . diltiazem (CARDIZEM) 30 MG tablet Take 1 tablet (30 mg total) by mouth every 8 (eight) hours.  . levothyroxine (SYNTHROID) 100 MCG tablet Take one tablet by mouth once daily 30 minutes before breakfast on empty stomach  . Multiple Vitamins-Minerals (PRESERVISION AREDS 2) CAPS Take 1 capsule by mouth 2 (two) times a day.   . OVER THE COUNTER MEDICATION Take 2 capsules by mouth daily. MitoQ  . traMADol (ULTRAM) 50 MG tablet Take 0.5 tablets (25 mg total) by   mouth daily as needed for severe pain.  . vitamin B-12 (CYANOCOBALAMIN) 1000 MCG tablet Take 1 tablet (1,000 mcg total) by mouth daily.  . [DISCONTINUED] amLODipine (NORVASC) 10 MG tablet Take 1 tablet (10 mg total) by mouth every evening.   No facility-administered encounter medications on file as of 11/23/2019.    Review of Systems:  Review of Systems  Constitutional: Positive for malaise/fatigue. Negative for chills and fever.  HENT: Negative for congestion.   Respiratory: Negative for cough and shortness of breath.   Cardiovascular: Negative for chest pain, palpitations and leg swelling.       All improved  Gastrointestinal: Negative for abdominal pain and constipation.   Genitourinary: Negative for dysuria.  Musculoskeletal: Negative for falls and joint pain.  Neurological: Negative for dizziness, loss of consciousness and weakness.  Endo/Heme/Allergies: Bruises/bleeds easily.  Psychiatric/Behavioral: Negative for memory loss.       Tearful when notes decline in health     Health Maintenance  Topic Date Due  . DEXA SCAN  09/21/2020 (Originally 01/04/1995)  . TETANUS/TDAP  08/28/2023  . INFLUENZA VACCINE  Completed  . COVID-19 Vaccine  Completed  . PNA vac Low Risk Adult  Completed    Physical Exam: Vitals:   11/23/19 1426  BP: 118/62  Pulse: 75  Temp: 97.7 F (36.5 C)  SpO2: 97%  Weight: 153 lb 6.4 oz (69.6 kg)  Height: 5' 3" (1.6 m)   Body mass index is 27.17 kg/m. Physical Exam Vitals reviewed.  Constitutional:      Appearance: Normal appearance.  Eyes:     Comments: glasses  Cardiovascular:     Rate and Rhythm: Normal rate and regular rhythm.     Heart sounds: Murmur heard.   Pulmonary:     Effort: Pulmonary effort is normal.     Breath sounds: Normal breath sounds. No wheezing, rhonchi or rales.  Abdominal:     General: Bowel sounds are normal.     Palpations: Abdomen is soft.     Tenderness: There is no abdominal tenderness. There is no guarding or rebound.  Musculoskeletal:        General: Normal range of motion.     Right lower leg: Edema present.     Left lower leg: Edema present.     Comments: Very minimal swelling of feet  Skin:    General: Skin is warm and dry.  Neurological:     General: No focal deficit present.     Mental Status: She is alert and oriented to person, place, and time.     Gait: Gait normal.  Psychiatric:     Comments: Tearful at times but denies depression     Labs reviewed: Basic Metabolic Panel: Recent Labs    12/08/18 1004 04/02/19 0821 06/04/19 0810 10/06/19 1303 11/12/19 1027 11/12/19 2352 11/13/19 0547 11/14/19 0133 11/19/19 1040 11/19/19 1041  NA 136   < > 137   < >  --     < > 131* 133*  --  137  K 4.8   < > 5.0   < >  --    < > 6.0* 5.1  --  5.3*  CL 102   < > 102   < >  --    < > 101 103  --  98  CO2 28   < > 28   < >  --    < > 21* 24  --  20  GLUCOSE 86   < > 99   < >  --    < >   111* 96  --  87  BUN 21   < > 15   < >  --    < > 58* 49*  --  20  CREATININE 0.86   < > 0.89*   < >  --    < > 1.73* 1.45*  --  0.95  CALCIUM 10.0   < > 10.3   < >  --    < > 10.1 9.3  --  10.5*  MG  --   --   --   --   --   --   --  2.1 2.1  --   TSH 4.58*  --  3.69  --  2.77  --   --   --   --   --    < > = values in this interval not displayed.   Liver Function Tests: Recent Labs    06/04/19 0810 11/12/19 2352 11/13/19 0547  AST 14 27 22  ALT 18 64* 52*  ALKPHOS  --  47 41  BILITOT 0.5 0.8 1.1  PROT 6.0* 6.2* 5.6*  ALBUMIN  --  4.1 3.7   No results for input(s): LIPASE, AMYLASE in the last 8760 hours. No results for input(s): AMMONIA in the last 8760 hours. CBC: Recent Labs    06/04/19 0810 10/02/19 1158 11/12/19 2352 11/13/19 0547  WBC 4.4 4.9 9.9 9.1  NEUTROABS 2,803  --  7.1 6.3  HGB 14.1 13.4 12.3 11.3*  HCT 41.9 38.6 38.4 35.1*  MCV 88.2 87 93.0 93.1  PLT 175 150 184 159   Lipid Panel: Recent Labs    12/08/18 1004  CHOL 112  HDL 34*  LDLCALC 62  TRIG 80  CHOLHDL 3.3   Lab Results  Component Value Date   HGBA1C 5.1 08/22/2016    Procedures since last visit: DG Chest 2 View  Result Date: 11/13/2019 CLINICAL DATA:  Shortness of breath EXAM: CHEST - 2 VIEW COMPARISON:  January 28, 2012 FINDINGS: The heart size and mediastinal contours are borderline enlarged. There is prominence of the central pulmonary vasculature. The visualized skeletal structures are unremarkable. IMPRESSION: Mild cardiomegaly and pulmonary vascular congestion. Electronically Signed   By: Bindu  Avutu M.D.   On: 11/13/2019 00:18    Assessment/Plan 1. SVT (supraventricular tachycardia) (HCC) - may need holter monitor if symptoms return, f/u as planned with Dr.  Taylor about this -continue on lower dose of cardizem  2. Exertional dyspnea -much improved  3. Aortic valve stenosis, etiology of cardiac valve disease unspecified -stable, cont monitoring with Dr. Tolia  4. Severe mitral regurgitation -stable, cont monitoring with Dr. Tolia -does not want surgeries  5. AKI (acute kidney injury) (HCC) - improving, f/u lab again for hyperkalemia -continues off losartan  - Basic metabolic panel  6. Hyperkalemia - Basic metabolic panel recheck today as does not see cardio for another month -is off bananas and on blueberries instead  meds reconciled today from hospitalization  Labs/tests ordered:  Bmp today Next appt:  12/21/2019  Tiffany L. Reed, D.O. Geriatrics [facility] [address] [place], [place] [address] Cell Phone (Mon-Fri 8am-5pm):  [phone] On Call:  [phone] & follow prompts after 5pm & weekends Office Phone:  [phone] Office Fax:  [phone]   

## 2019-11-24 LAB — BASIC METABOLIC PANEL
BUN: 23 mg/dL (ref 7–25)
CO2: 25 mmol/L (ref 20–32)
Calcium: 10.4 mg/dL (ref 8.6–10.4)
Chloride: 102 mmol/L (ref 98–110)
Creat: 0.74 mg/dL (ref 0.60–0.88)
Glucose, Bld: 85 mg/dL (ref 65–99)
Potassium: 4.5 mmol/L (ref 3.5–5.3)
Sodium: 135 mmol/L (ref 135–146)

## 2019-11-24 NOTE — Progress Notes (Signed)
Kidney function continued to get better. Potassium is now normal off the bananas.

## 2019-12-01 ENCOUNTER — Ambulatory Visit: Payer: Medicare PPO | Admitting: Internal Medicine

## 2019-12-01 ENCOUNTER — Other Ambulatory Visit: Payer: Self-pay

## 2019-12-01 ENCOUNTER — Encounter: Payer: Self-pay | Admitting: Internal Medicine

## 2019-12-01 VITALS — BP 144/64 | HR 74 | Ht 63.0 in | Wt 154.6 lb

## 2019-12-01 DIAGNOSIS — I471 Supraventricular tachycardia: Secondary | ICD-10-CM | POA: Diagnosis not present

## 2019-12-01 NOTE — Progress Notes (Signed)
HPI Donna [name] is referred today by Dr. Terri Skains for evaluation of SVT. She is a pleasant 86 yo widow with a h/o HTN who presents today for evaluation of SVT. She has worn a cardiac monitor and had multiple episodes of a short RP tachycardia at rates of 160/min. Unfortunately she has also had episodes of sinus node dysfunction and junctional rhythm during the day in the low 40's. She has not had syncope. She does not feel all of her episodes of SVT. She is fairly anxious after the loss of her husband.  No Known Allergies   Current Outpatient Medications  Medication Sig Dispense Refill  . amLODipine (NORVASC) 10 MG tablet Take 10 mg by mouth in the morning and at bedtime.    . Blood Pressure Monitoring KIT Essential hypertension check bp daily 1 kit 0  . Calcium Carbonate-Vitamin D 600-400 MG-UNIT per tablet Take 2 tablets by mouth daily.    . Coenzyme Q10 (COQ10) 400 MG CAPS Take 400 mg by mouth daily.     Marland Kitchen diltiazem (CARDIZEM) 30 MG tablet Take 1 tablet (30 mg total) by mouth every 8 (eight) hours. 90 tablet 1  . levothyroxine (SYNTHROID) 100 MCG tablet Take one tablet by mouth once daily 30 minutes before breakfast on empty stomach 90 tablet 1  . Multiple Vitamins-Minerals (PRESERVISION AREDS 2) CAPS Take 1 capsule by mouth 2 (two) times a day.     Marland Kitchen OVER THE COUNTER MEDICATION Take 2 capsules by mouth daily. MitoQ    . traMADol (ULTRAM) 50 MG tablet Take 0.5 tablets (25 mg total) by mouth daily as needed for severe pain. 15 tablet 0  . vitamin B-12 (CYANOCOBALAMIN) 1000 MCG tablet Take 1 tablet (1,000 mcg total) by mouth daily. 30 tablet 5   No current facility-administered medications for this visit.     Past Medical History:  Diagnosis Date  . Aortic stenosis   . Hemorrhage of gastrointestinal tract, unspecified   . Hypertension   . Migraine   . Mitral regurgitation   . OA (osteoarthritis)   . Thyroid cyst     ROS:   All systems reviewed and negative except as noted in  the HPI.   Past Surgical History:  Procedure Laterality Date  . CATARACT EXTRACTION  1990   rt  . CATARACT EXTRACTION  2005   left  . LEFT HEART CATH AND CORONARY ANGIOGRAPHY N/A 10/06/2019   Procedure: LEFT HEART CATH AND CORONARY ANGIOGRAPHY;  Surgeon: Nigel Mormon, MD;  Location: [place];  Service: Cardiovascular;  Laterality: N/A;  . REVISION TOTAL KNEE ARTHROPLASTY  2008  . THUMB ARTHROSCOPY    . TONSILLECTOMY       Family History  Problem Relation Age of Onset  . CVA Father 80  . Pancreatic cancer Mother   . CVA Sister 54  . Brain cancer Brother   . Arthritis Sister   . Heart disease Sister   . Breast cancer Neg Hx      Social History   Socioeconomic History  . Marital status: Widowed    Spouse name: Not on file  . Number of children: 2  . Years of education: Not on file  . Highest education level: Not on file  Occupational History  . Not on file  Tobacco Use  . Smoking status: Former Smoker    Years: 2.00    Types: Cigarettes    Quit date: 01/16/1948    Years since quitting: 71.9  .  Smokeless tobacco: Never Used  . Tobacco comment: smoked on occ when she was a teen  Vaping Use  . Vaping Use: Never used  Substance and Sexual Activity  . Alcohol use: Not Currently    Comment: rarely has a glass of wine  . Drug use: No  . Sexual activity: Not on file  Other Topics Concern  . Not on file  Social History Narrative  . Not on file   Social Determinants of Health   Financial Resource Strain:   . Difficulty of Paying Living Expenses: Not on file  Food Insecurity:   . Worried About Charity fundraiser in the Last Year: Not on file  . Ran Out of Food in the Last Year: Not on file  Transportation Needs:   . Lack of Transportation (Medical): Not on file  . Lack of Transportation (Non-Medical): Not on file  Physical Activity:   . Days of Exercise per Week: Not on file  . Minutes of Exercise per Session: Not on file  Stress:   . Feeling  of Stress : Not on file  Social Connections:   . Frequency of Communication with Friends and Family: Not on file  . Frequency of Social Gatherings with Friends and Family: Not on file  . Attends Religious Services: Not on file  . Active Member of Clubs or Organizations: Not on file  . Attends Archivist Meetings: Not on file  . Marital Status: Not on file  Intimate Partner Violence:   . Fear of Current or Ex-Partner: Not on file  . Emotionally Abused: Not on file  . Physically Abused: Not on file  . Sexually Abused: Not on file     BP (!) 144/64   Pulse 74   Ht 5' 3"  (1.6 m)   Wt 154 lb 9.6 oz (70.1 kg)   SpO2 92%   BMI 27.39 kg/m   Physical Exam:  Well appearing NAD HEENT: Unremarkable Neck:  No JVD, no thyromegally Lymphatics:  No adenopathy Back:  No CVA tenderness Lungs:  Clear with no wheezes HEART:  Regular rate rhythm, 2/6 AS murmurs, no rubs, no clicks Abd:  soft, positive bowel sounds, no organomegally, no rebound, no guarding Ext:  2 plus pulses, no edema, no cyanosis, no clubbing Skin:  No rashes no nodules Neuro:  CN II through XII intact, motor grossly intact  EKG - NSR with QRS widening  Assess/Plan: 1. SVT - I have discussed the treatment options with the patient and the risks/benefits/goals/expectations of EP study and catheter ablation were reviewed and she wishes to reflect and will call us if she would like to proceed with ablation. Her conduction system disease makes ablation a reasonable option but a resulting PPM is more likely. She understands.  Donna [name] Donna Zick,MD

## 2019-12-01 NOTE — Patient Instructions (Signed)
Medication Instructions:  Your physician recommends that you continue on your current medications as directed. Please refer to the Current Medication list given to you today. *If you need a refill on your cardiac medications before your next appointment, please call your pharmacy*   Lab Work: None ordered.  If you have labs (blood work) drawn today and your tests are completely normal, you will receive your results only by: Marland Kitchen MyChart Message (if you have MyChart) OR . A paper copy in the mail If you have any lab test that is abnormal or we need to change your treatment, we will call you to review the results.   Testing/Procedures: Your physician has recommended that you have an ablation. Catheter ablation is a medical procedure used to treat some cardiac arrhythmias (irregular heartbeats). During catheter ablation, a long, thin, flexible tube is put into a blood vessel in your groin (upper thigh), or neck. This tube is called an ablation catheter. It is then guided to your heart through the blood vessel. Radio frequency waves destroy small areas of heart tissue where abnormal heartbeats may cause an arrhythmia to start. Please see the instruction sheet given to you today.    Follow-Up: At [facility], you and your health needs are our priority.  As part of our continuing mission to provide you with exceptional heart care, we have created designated Provider Care Teams.  These Care Teams include your primary Cardiologist (physician) and Advanced Practice Providers (APPs -  Physician Assistants and Nurse Practitioners) who all work together to provide you with the care you need, when you need it.  We recommend signing up for the patient portal called "MyChart".  Sign up information is provided on this After Visit Summary.  MyChart is used to connect with patients for Virtual Visits (Telemedicine).  Patients are able to view lab/test results, encounter notes, upcoming appointments, etc.   Non-urgent messages can be sent to your provider as well.   To learn more about what you can do with MyChart, go to [email or web address].    Your next appointment:   Please call [phone] once you have spoken with your daughters and we will be happy to schedule date and time for your ablation.

## 2019-12-03 NOTE — Addendum Note (Signed)
Addended by: Solon Augusta on: 12/03/2019 08:36 AM   Modules accepted: Orders

## 2019-12-08 ENCOUNTER — Telehealth: Payer: Self-pay | Admitting: Internal Medicine

## 2019-12-08 DIAGNOSIS — I471 Supraventricular tachycardia: Secondary | ICD-10-CM

## 2019-12-08 NOTE — Telephone Encounter (Signed)
Patient states she would like to schedule her ablation.

## 2019-12-11 ENCOUNTER — Other Ambulatory Visit: Payer: Self-pay | Admitting: Cardiology

## 2019-12-11 DIAGNOSIS — I080 Rheumatic disorders of both mitral and aortic valves: Secondary | ICD-10-CM

## 2019-12-11 DIAGNOSIS — I471 Supraventricular tachycardia: Secondary | ICD-10-CM

## 2019-12-14 NOTE — Telephone Encounter (Signed)
Returned call to Pt.  She would like to schedule ablation for January 18, 2020  Will get lab work December 27  covid test December 31  Will check with Dr. Ladona Ridgel regarding carto before scheduling

## 2019-12-14 NOTE — Telephone Encounter (Signed)
Follow up:    Patient returning a call back from last week. Please call back.

## 2019-12-15 ENCOUNTER — Ambulatory Visit: Payer: Medicare PPO | Admitting: Cardiology

## 2019-12-21 ENCOUNTER — Encounter: Payer: Self-pay | Admitting: Internal Medicine

## 2019-12-21 ENCOUNTER — Other Ambulatory Visit: Payer: Self-pay

## 2019-12-21 ENCOUNTER — Ambulatory Visit (INDEPENDENT_AMBULATORY_CARE_PROVIDER_SITE_OTHER): Payer: Medicare PPO | Admitting: Internal Medicine

## 2019-12-21 VITALS — BP 122/62 | HR 63 | Temp 97.7°F | Ht 63.0 in | Wt 155.4 lb

## 2019-12-21 DIAGNOSIS — R0609 Other forms of dyspnea: Secondary | ICD-10-CM

## 2019-12-21 DIAGNOSIS — E039 Hypothyroidism, unspecified: Secondary | ICD-10-CM | POA: Diagnosis not present

## 2019-12-21 DIAGNOSIS — I350 Nonrheumatic aortic (valve) stenosis: Secondary | ICD-10-CM | POA: Diagnosis not present

## 2019-12-21 DIAGNOSIS — R0600 Dyspnea, unspecified: Secondary | ICD-10-CM | POA: Diagnosis not present

## 2019-12-21 DIAGNOSIS — R609 Edema, unspecified: Secondary | ICD-10-CM | POA: Diagnosis not present

## 2019-12-21 DIAGNOSIS — I471 Supraventricular tachycardia, unspecified: Secondary | ICD-10-CM

## 2019-12-21 NOTE — Progress Notes (Signed)
Location:  [facility] clinic Provider:  Sameerah Nachtigal L. Mariea Clonts, D.O., C.M.D.  Code Status: DNR Goals of Care:  Advanced Directives 12/21/2019  Does Patient Have a Medical Advance Directive? Yes  Type of Paramedic of [place];Out of facility DNR (pink MOST or yellow form)  Does patient want to make changes to medical advance directive? No - Patient declined  Copy of [place] in Chart? Yes - validated most recent copy scanned in chart (See row information)  Pre-existing out of facility DNR order (yellow form or pink MOST form) Pink MOST/Yellow Form most recent copy in chart - Physician notified to receive inpatient order     Chief Complaint  Patient presents with  . Medical Management of Chronic Issues    Follow up from last vist on 11/23/2019.Patient states she had swelling last week but it goes down at night, its painful and her ankle/skin hurts.     HPI: Patient is a 86 y.o. female seen today for medical management of chronic diseases.    She is scheduled for ablation in the new year.  cardizem was stopped 175m by Dr. TTanja Port    She is not bothered as much lately by dizziness.  Breathing also seems improved.  Does get short of breath with exertion like moving laundry washer to dryer or going to take out trash which is a ways to go out there (mild not where she has to stop completely just to breathe), but able to walk through the house w/o DOE.  Not feeling palpitations much either.  No more of the really bad feeling spells.    Having more edema last week (tues or wed) that goes down at night.  She has a lot of rough places that have come up in the past year.  They are red and scaly.  She has soreness in the ankles and lower legs that's making it hurt to walk.  Feels like the skin hurts.  Even happens first thing in the morning.  Not burning.  Feels like the skin is pulling.   Her amlodipine is listed as 2 pills at night but not clear if 534mor 1039mills.   She thinks they are 5mg38md his note indicates 10mg43mal at hs.  Does miss her husband. Has had grief counseling which helps.  Does not want to take medication for her mood.  Past Medical History:  Diagnosis Date  . Aortic stenosis   . Hemorrhage of gastrointestinal tract, unspecified   . Hypertension   . Migraine   . Mitral regurgitation   . OA (osteoarthritis)   . Thyroid cyst     Past Surgical History:  Procedure Laterality Date  . CATARACT EXTRACTION  1990   rt  . CATARACT EXTRACTION  2005   left  . LEFT HEART CATH AND CORONARY ANGIOGRAPHY N/A 10/06/2019   Procedure: LEFT HEART CATH AND CORONARY ANGIOGRAPHY;  Surgeon: PatwaNigel Mormon  [facility];  Service: Cardiovascular;  Laterality: N/A;  . REVISION TOTAL KNEE ARTHROPLASTY  2008  . THUMB ARTHROSCOPY    . TONSILLECTOMY      No Known Allergies  Outpatient Encounter Medications as of 12/21/2019  Medication Sig  . amLODipine (NORVASC) 10 MG tablet Take 10 mg by mouth at bedtime. 2 at bedtime  . Blood Pressure Monitoring KIT Essential hypertension check bp daily  . Calcium Carbonate-Vitamin D 600-400 MG-UNIT per tablet Take 2 tablets by mouth daily.  . Coenzyme Q10 (  COQ10) 400 MG CAPS Take 400 mg by mouth daily.   Marland Kitchen diltiazem (CARDIZEM) 30 MG tablet Take 1 tablet (30 mg total) by mouth every 8 (eight) hours.  Marland Kitchen levothyroxine (SYNTHROID) 100 MCG tablet Take one tablet by mouth once daily 30 minutes before breakfast on empty stomach  . Multiple Vitamins-Minerals (PRESERVISION AREDS 2) CAPS Take 1 capsule by mouth 2 (two) times a day.   Marland Kitchen OVER THE COUNTER MEDICATION Take 2 capsules by mouth daily. MitoQ  . traMADol (ULTRAM) 50 MG tablet Take 0.5 tablets (25 mg total) by mouth daily as needed for severe pain.  . vitamin B-12 (CYANOCOBALAMIN) 1000 MCG tablet Take 1 tablet (1,000 mcg total) by mouth daily.  . [DISCONTINUED] diltiazem (CARDIZEM CD) 120 MG 24 hr capsule TAKE 1 CAPSULE(120 MG) BY MOUTH IN  THE MORNING   No facility-administered encounter medications on file as of 12/21/2019.    Review of Systems:  Review of Systems  Constitutional: Negative for chills and fever.  HENT: Positive for hearing loss. Negative for congestion and sore throat.   Eyes: Negative for blurred vision.       Glasses  Respiratory: Positive for shortness of breath.   Cardiovascular: Positive for leg swelling. Negative for chest pain and palpitations.  Gastrointestinal: Negative for abdominal pain, blood in stool, constipation, diarrhea and melena.  Genitourinary: Negative for dysuria.  Musculoskeletal: Positive for back pain and falls.  Skin: Positive for rash.  Neurological: Negative for dizziness, tingling, sensory change and loss of consciousness.  Endo/Heme/Allergies: Bruises/bleeds easily.  Psychiatric/Behavioral: Negative for memory loss.       Grief    Health Maintenance  Topic Date Due  . DEXA SCAN  09/21/2020 (Originally 01/04/1995)  . TETANUS/TDAP  08/28/2023  . INFLUENZA VACCINE  Completed  . COVID-19 Vaccine  Completed  . PNA vac Low Risk Adult  Completed    Physical Exam: Vitals:   12/21/19 1054  BP: 122/62  Pulse: 63  Temp: 97.7 F (36.5 C)  TempSrc: Temporal  SpO2: 98%  Weight: 155 lb 6.4 oz (70.5 kg)  Height: 5' 3"  (1.6 m)   Body mass index is 27.53 kg/m. Physical Exam Vitals reviewed.  Constitutional:      General: She is not in acute distress.    Appearance: Normal appearance. She is not toxic-appearing.  HENT:     Head: Normocephalic and atraumatic.  Cardiovascular:     Rate and Rhythm: Normal rate and regular rhythm.     Pulses: Normal pulses.     Heart sounds: Murmur heard.   Pulmonary:     Effort: Pulmonary effort is normal.     Breath sounds: Normal breath sounds. No wheezing, rhonchi or rales.  Musculoskeletal:        General: Normal range of motion.     Right lower leg: Edema present.     Left lower leg: Edema present.  Skin:    General: Skin is  warm and dry.     Comments: Red scaly areas on shins and feet that look like they were small blisters, legs are tender to touch  Neurological:     General: No focal deficit present.     Mental Status: She is alert and oriented to person, place, and time.  Psychiatric:        Mood and Affect: Mood normal.     Comments: Tearful talking about loss of her husband     Labs reviewed: Basic Metabolic Panel: Recent Labs    06/04/19 0810 10/06/19  1303 11/12/19 1027 11/12/19 2352 11/14/19 0133 11/19/19 1040 11/19/19 1041 11/23/19 1533  NA 137   < >  --    < > 133*  --  137 135  K 5.0   < >  --    < > 5.1  --  5.3* 4.5  CL 102   < >  --    < > 103  --  98 102  CO2 28   < >  --    < > 24  --  20 25  GLUCOSE 99   < >  --    < > 96  --  87 85  BUN 15   < >  --    < > 49*  --  20 23  CREATININE 0.89*   < >  --    < > 1.45*  --  0.95 0.74  CALCIUM 10.3   < >  --    < > 9.3  --  10.5* 10.4  MG  --   --   --   --  2.1 2.1  --   --   TSH 3.69  --  2.77  --   --   --   --   --    < > = values in this interval not displayed.   Liver Function Tests: Recent Labs    06/04/19 0810 11/12/19 2352 11/13/19 0547  AST 14 27 22   ALT 18 64* 52*  ALKPHOS  --  47 41  BILITOT 0.5 0.8 1.1  PROT 6.0* 6.2* 5.6*  ALBUMIN  --  4.1 3.7   No results for input(s): LIPASE, AMYLASE in the last 8760 hours. No results for input(s): AMMONIA in the last 8760 hours. CBC: Recent Labs    06/04/19 0810 10/02/19 1158 11/12/19 2352 11/13/19 0547  WBC 4.4 4.9 9.9 9.1  NEUTROABS 2,803  --  7.1 6.3  HGB 14.1 13.4 12.3 11.3*  HCT 41.9 38.6 38.4 35.1*  MCV 88.2 87 93.0 93.1  PLT 175 150 184 159   Lipid Panel: No results for input(s): CHOL, HDL, LDLCALC, TRIG, CHOLHDL, LDLDIRECT in the last 8760 hours. Lab Results  Component Value Date   HGBA1C 5.1 08/22/2016    Assessment/Plan 1. Exertional dyspnea -continues but much better than it was at prior visits by her report  2. Aortic valve stenosis, etiology  of cardiac valve disease unspecified -echo did not show considerable change with cardiology, cont to monitor  3. SVT (supraventricular tachycardia) ([place]) -for ablation 01/18/20 -will follow along  4. Edema, unspecified type -may be due to amlodipine vs effect of variable heart rates at times  5. Hypothyroidism, unspecified type -cont current levothyroxine and monitor Lab Results  Component Value Date   TSH 2.77 11/12/2019    Labs/tests ordered:  No new added Next appt:  04/21/2020 med mgt and prn  Keddrick Wyne L. Suzzane Quilter, D.O. [facility] [address],  [address] Cell Phone (Mon-Fri 8am-5pm):  [phone] On Call:  [phone] & follow prompts after 5pm & weekends Office Phone:  [phone] Office Fax:  [phone]

## 2019-12-21 NOTE — Patient Instructions (Addendum)
Please do get your covid booster scheduled.    Please elevated your feet at rest.  If the swelling does not go down at night, let us know.

## 2019-12-22 ENCOUNTER — Other Ambulatory Visit: Payer: Self-pay | Admitting: Cardiology

## 2019-12-22 ENCOUNTER — Ambulatory Visit: Payer: Medicare PPO | Admitting: Cardiology

## 2019-12-22 DIAGNOSIS — I10 Essential (primary) hypertension: Secondary | ICD-10-CM

## 2019-12-25 ENCOUNTER — Other Ambulatory Visit: Payer: Self-pay

## 2019-12-25 MED ORDER — AMLODIPINE BESYLATE 10 MG PO TABS
10.0000 mg | ORAL_TABLET | Freq: Every day | ORAL | 1 refills | Status: DC
Start: 2019-12-25 — End: 2020-02-16

## 2019-12-25 NOTE — Telephone Encounter (Signed)
Work up complete.  Letter mailed to Pt  Will also plan to meet with Pt when she comes in for lab work.

## 2020-01-06 DIAGNOSIS — L565 Disseminated superficial actinic porokeratosis (DSAP): Secondary | ICD-10-CM | POA: Diagnosis not present

## 2020-01-11 ENCOUNTER — Other Ambulatory Visit: Payer: Self-pay | Admitting: *Deleted

## 2020-01-11 ENCOUNTER — Other Ambulatory Visit: Payer: Self-pay

## 2020-01-11 ENCOUNTER — Other Ambulatory Visit: Payer: Medicare PPO | Admitting: *Deleted

## 2020-01-11 DIAGNOSIS — I471 Supraventricular tachycardia: Secondary | ICD-10-CM

## 2020-01-11 LAB — CBC WITH DIFFERENTIAL/PLATELET
Basophils Absolute: 0 10*3/uL (ref 0.0–0.2)
Basos: 1 %
EOS (ABSOLUTE): 0.2 10*3/uL (ref 0.0–0.4)
Eos: 3 %
Hematocrit: 37 % (ref 34.0–46.6)
Hemoglobin: 12.6 g/dL (ref 11.1–15.9)
Immature Grans (Abs): 0 10*3/uL (ref 0.0–0.1)
Immature Granulocytes: 0 %
Lymphocytes Absolute: 1.9 10*3/uL (ref 0.7–3.1)
Lymphs: 32 %
MCH: 30.5 pg (ref 26.6–33.0)
MCHC: 34.1 g/dL (ref 31.5–35.7)
MCV: 90 fL (ref 79–97)
Monocytes Absolute: 0.8 10*3/uL (ref 0.1–0.9)
Monocytes: 13 %
Neutrophils Absolute: 3.2 10*3/uL (ref 1.4–7.0)
Neutrophils: 51 %
Platelets: 187 10*3/uL (ref 150–450)
RBC: 4.13 x10E6/uL (ref 3.77–5.28)
RDW: 12.6 % (ref 11.7–15.4)
WBC: 6.2 10*3/uL (ref 3.4–10.8)

## 2020-01-11 LAB — BASIC METABOLIC PANEL
BUN/Creatinine Ratio: 25 (ref 12–28)
BUN: 20 mg/dL (ref 10–36)
CO2: 22 mmol/L (ref 20–29)
Calcium: 9.8 mg/dL (ref 8.7–10.3)
Chloride: 99 mmol/L (ref 96–106)
Creatinine, Ser: 0.81 mg/dL (ref 0.57–1.00)
GFR calc Af Amer: 74 mL/min/{1.73_m2} (ref >59)
GFR calc non Af Amer: 64 mL/min/{1.73_m2} (ref >59)
Glucose: 73 mg/dL (ref 65–99)
Potassium: 4.6 mmol/L (ref 3.5–5.2)
Sodium: 137 mmol/L (ref 134–144)

## 2020-01-14 NOTE — Progress Notes (Signed)
Attempted to call patient regarding procedure instructions for Monday.  Left voice mail on the following items: Arrival time 0530 Nothing to eat or drink after midnight No meds AM of procedure Responsible person to drive you home and stay with you for 24 hrs

## 2020-01-15 ENCOUNTER — Other Ambulatory Visit (HOSPITAL_COMMUNITY)
Admission: RE | Admit: 2020-01-15 | Discharge: 2020-01-15 | Disposition: A | Discharge status: Home / Self Care | Payer: Medicare PPO | Source: Ambulatory Visit | Attending: Internal Medicine | Admitting: Internal Medicine

## 2020-01-15 DIAGNOSIS — Z20822 Contact with and (suspected) exposure to covid-19: Secondary | ICD-10-CM | POA: Insufficient documentation

## 2020-01-15 DIAGNOSIS — Z01812 Encounter for preprocedural laboratory examination: Secondary | ICD-10-CM | POA: Insufficient documentation

## 2020-01-15 LAB — SARS CORONAVIRUS 2 (TAT 6-24 HRS): SARS Coronavirus 2: NEGATIVE

## 2020-01-18 ENCOUNTER — Other Ambulatory Visit: Payer: Self-pay

## 2020-01-18 ENCOUNTER — Encounter (HOSPITAL_COMMUNITY)
Admission: RE | Disposition: A | Discharge status: Home / Self Care | Payer: Medicare PPO | Source: Home / Self Care | Attending: Internal Medicine

## 2020-01-18 ENCOUNTER — Encounter (HOSPITAL_COMMUNITY): Payer: Self-pay | Admitting: Internal Medicine

## 2020-01-18 ENCOUNTER — Ambulatory Visit (HOSPITAL_COMMUNITY)
Admission: RE | Admit: 2020-01-18 | Discharge: 2020-01-18 | Disposition: A | Discharge status: Home / Self Care | Payer: Medicare PPO | Attending: Internal Medicine | Admitting: Internal Medicine

## 2020-01-18 DIAGNOSIS — I10 Essential (primary) hypertension: Secondary | ICD-10-CM | POA: Diagnosis not present

## 2020-01-18 DIAGNOSIS — I471 Supraventricular tachycardia: Secondary | ICD-10-CM | POA: Diagnosis not present

## 2020-01-18 DIAGNOSIS — Z87891 Personal history of nicotine dependence: Secondary | ICD-10-CM | POA: Insufficient documentation

## 2020-01-18 DIAGNOSIS — Z79899 Other long term (current) drug therapy: Secondary | ICD-10-CM | POA: Insufficient documentation

## 2020-01-18 DIAGNOSIS — Z79890 Hormone replacement therapy (postmenopausal): Secondary | ICD-10-CM | POA: Diagnosis not present

## 2020-01-18 HISTORY — PX: SVT ABLATION: EP1225

## 2020-01-18 SURGERY — SVT ABLATION

## 2020-01-18 MED ORDER — ONDANSETRON HCL 4 MG/2ML IJ SOLN: 4.0000 mg | Freq: Four times a day (QID) | INTRAMUSCULAR | Status: DC | PRN

## 2020-01-18 MED ORDER — SODIUM CHLORIDE 0.9% FLUSH: 3.0000 mL | Freq: Two times a day (BID) | INTRAVENOUS | Status: DC

## 2020-01-18 MED ORDER — FENTANYL CITRATE (PF) 100 MCG/2ML IJ SOLN
INTRAMUSCULAR | Status: AC
  Filled 2020-01-18: qty 2

## 2020-01-18 MED ORDER — SODIUM CHLORIDE 0.9 % IV SOLN: 250.0000 mL | INTRAVENOUS | Status: DC | PRN

## 2020-01-18 MED ORDER — FENTANYL CITRATE (PF) 100 MCG/2ML IJ SOLN
INTRAMUSCULAR | Status: DC | PRN
  Administered 2020-01-18 (×4): 12.5 ug via INTRAVENOUS

## 2020-01-18 MED ORDER — BUPIVACAINE HCL (PF) 0.25 % IJ SOLN
INTRAMUSCULAR | Status: DC | PRN
  Administered 2020-01-18: 30 mL

## 2020-01-18 MED ORDER — MIDAZOLAM HCL 5 MG/5ML IJ SOLN
INTRAMUSCULAR | Status: AC
  Filled 2020-01-18: qty 5

## 2020-01-18 MED ORDER — SODIUM CHLORIDE 0.9 % IV SOLN: INTRAVENOUS | Status: DC | PRN

## 2020-01-18 MED ORDER — ISOPROTERENOL HCL 0.2 MG/ML IJ SOLN
INTRAMUSCULAR | Status: AC
  Filled 2020-01-18: qty 5

## 2020-01-18 MED ORDER — SODIUM CHLORIDE 0.9% FLUSH
3.0000 mL | INTRAVENOUS | Status: DC | PRN
Start: 2020-01-18 — End: 2020-01-18

## 2020-01-18 MED ORDER — SODIUM CHLORIDE 0.9 % IV SOLN: INTRAVENOUS | Status: DC

## 2020-01-18 MED ORDER — ISOPROTERENOL HCL 0.2 MG/ML IJ SOLN
INTRAVENOUS | Status: DC | PRN
  Administered 2020-01-18: 4 ug/min via INTRAVENOUS

## 2020-01-18 MED ORDER — ACETAMINOPHEN 325 MG PO TABS: 650.0000 mg | ORAL_TABLET | ORAL | Status: DC | PRN

## 2020-01-18 MED ORDER — HEPARIN (PORCINE) IN NACL 1000-0.9 UT/500ML-% IV SOLN
INTRAVENOUS | Status: DC | PRN
  Administered 2020-01-18: 500 mL

## 2020-01-18 MED ORDER — MIDAZOLAM HCL 5 MG/5ML IJ SOLN
INTRAMUSCULAR | Status: DC | PRN
  Administered 2020-01-18 (×4): 1 mg via INTRAVENOUS

## 2020-01-18 MED ORDER — BUPIVACAINE HCL (PF) 0.25 % IJ SOLN
INTRAMUSCULAR | Status: AC
  Filled 2020-01-18: qty 60

## 2020-01-18 SURGICAL SUPPLY — 11 items
BAG SNAP BAND KOVER 36X36 (MISCELLANEOUS) ×1 IMPLANT
CATH EZ STEER NAV 4MM D-F CUR (ABLATOR) ×1 IMPLANT
CATH JOSEPH QUAD ALLRED 6F REP (CATHETERS) ×2 IMPLANT
CATH POLARIS X 2.5/5/2.5 DECAP (CATHETERS) ×2 IMPLANT
PACK EP LATEX FREE (CUSTOM PROCEDURE TRAY) ×2
PACK EP LF (CUSTOM PROCEDURE TRAY) ×1 IMPLANT
PAD PRO RADIOLUCENT 2001M-C (PAD) ×2 IMPLANT
SHEATH PINNACLE 6F 10CM (SHEATH) ×2 IMPLANT
SHEATH PINNACLE 7F 10CM (SHEATH) ×1 IMPLANT
SHEATH PINNACLE 8F 10CM (SHEATH) ×1 IMPLANT
SHIELD RADPAD SCOOP 12X17 (MISCELLANEOUS) ×1 IMPLANT

## 2020-01-18 NOTE — Progress Notes (Addendum)
Removed all 3 Venous sheaths from R groin. Pressure held for 10 mins manually. Dressing clean, dry, intact. Site  level 0. R IJ sheath removed. Vaseline gauze and dressing applied, clean, dry, and intact. Site level 0 as well. Will continue to monitor. Bedrest  For 6 hrs starting at 10:15. Lacy Duverney, RN

## 2020-01-18 NOTE — Discharge Instructions (Signed)
Post procedure care instructions No driving for 4 days. No lifting over 5 lbs for 1 week. No vigorous or sexual activity for 1 week. You may return to work/your usual activities on 01/25/20. Keep procedure site clean & dry. If you notice increased pain, swelling, bleeding or pus, call/return!  You may shower after 24 hours, but no soaking in baths/hot tubs/pools for 1 week.      Cardiac Ablation, Care After Drink plenty of fluids for 24 hours  This sheet gives you information about how to care for yourself after your procedure. Your health care provider may also give you more specific instructions. If you have problems or questions, contact your health care provider. What can I expect after the procedure? After the procedure, it is common to have:  Bruising around your puncture site.  Tenderness around your puncture site.  Skipped heartbeats.  Tiredness (fatigue). Follow these instructions at home: Puncture site care   Follow instructions from your health care provider about how to take care of your puncture site. Make sure you: ? Wash your hands with soap and water before you change your bandage (dressing). If soap and water are not available, use hand sanitizer. ? Change your dressing as told by your health care provider.   Check your puncture site every day for signs of infection. Check for: ? Redness, swelling, or pain. ? Fluid or blood. If your puncture site starts to bleed, lie down on your back, apply firm pressure to the area, and contact your health care provider. ? Warmth. ? Pus or a bad smell. Driving  Ask your health care provider when it is safe for you to drive again after the procedure.  Do not drive or use heavy machinery while taking prescription pain medicine.  Do not drive for 24 hours if you were given a medicine to help you relax (sedative) during your procedure. Activity  Avoid activities that take a lot of effort for at least 3 days after your  procedure.  Do not lift anything that is heavier than 10 lb (4.5 kg), or the limit that you are told, until your health care provider says that it is safe.  Return to your normal activities as told by your health care provider. Ask your health care provider what activities are safe for you. General instructions  Take over-the-counter and prescription medicines only as told by your health care provider.  Do not use any products that contain nicotine or tobacco, such as cigarettes and e-cigarettes. If you need help quitting, ask your health care provider.  Do not take baths, swim, or use a hot tub until your health care provider approves.  Do not drink alcohol for 24 hours after your procedure.  Keep all follow-up visits as told by your health care provider. This is important. Contact a health care provider if:  You have redness, mild swelling, or pain around your puncture site.  You have fluid or blood coming from your puncture site that stops after applying firm pressure to the area.  Your puncture site feels warm to the touch.  You have pus or a bad smell coming from your puncture site.  You have a fever.  You have chest pain or discomfort that spreads to your neck, jaw, or arm.  You are sweating a lot.  You feel nauseous.  You have a fast or irregular heartbeat.  You have shortness of breath.  You are dizzy or light-headed and feel the need to lie down.  You have pain or numbness in the arm or leg closest to your puncture site. Get help right away if:  Your puncture site suddenly swells.  Your puncture site is bleeding and the bleeding does not stop after applying firm pressure to the area. These symptoms may represent a serious problem that is an emergency. Do not wait to see if the symptoms will go away. Get medical help right away. Call your local emergency services (911 in the [place] Do not drive yourself to the hospital. Summary  After the procedure, it is  normal to have bruising and tenderness at the puncture site in your groin, neck, or forearm.  Check your puncture site every day for signs of infection.  Get help right away if your puncture site is bleeding and the bleeding does not stop after applying firm pressure to the area. This is a medical emergency. This information is not intended to replace advice given to you by your health care provider. Make sure you discuss any questions you have with your health care provider. Document Revised: 12/14/2016 Document Reviewed: 04/12/2016 Elsevier Patient Education  2020 [employer].

## 2020-01-18 NOTE — H&P (Signed)
HPI Donna [name] is referred today by Dr. Terri Skains for evaluation of SVT. She is a pleasant 87 yo widow with a h/o HTN who presents today for evaluation of SVT. She has worn a cardiac monitor and had multiple episodes of a short RP tachycardia at rates of 160/min. Unfortunately she has also had episodes of sinus node dysfunction and junctional rhythm during the day in the low 40's. She has not had syncope. She does not feel all of her episodes of SVT. She is fairly anxious after the loss of her husband.  No Known Allergies         Current Outpatient Medications  Medication Sig Dispense Refill  . amLODipine (NORVASC) 10 MG tablet Take 10 mg by mouth in the morning and at bedtime.    . Blood Pressure Monitoring KIT Essential hypertension check bp daily 1 kit 0  . Calcium Carbonate-Vitamin D 600-400 MG-UNIT per tablet Take 2 tablets by mouth daily.    . Coenzyme Q10 (COQ10) 400 MG CAPS Take 400 mg by mouth daily.     Marland Kitchen diltiazem (CARDIZEM) 30 MG tablet Take 1 tablet (30 mg total) by mouth every 8 (eight) hours. 90 tablet 1  . levothyroxine (SYNTHROID) 100 MCG tablet Take one tablet by mouth once daily 30 minutes before breakfast on empty stomach 90 tablet 1  . Multiple Vitamins-Minerals (PRESERVISION AREDS 2) CAPS Take 1 capsule by mouth 2 (two) times a day.     Marland Kitchen OVER THE COUNTER MEDICATION Take 2 capsules by mouth daily. MitoQ    . traMADol (ULTRAM) 50 MG tablet Take 0.5 tablets (25 mg total) by mouth daily as needed for severe pain. 15 tablet 0  . vitamin B-12 (CYANOCOBALAMIN) 1000 MCG tablet Take 1 tablet (1,000 mcg total) by mouth daily. 30 tablet 5   No current facility-administered medications for this visit.         Past Medical History:  Diagnosis Date  . Aortic stenosis   . Hemorrhage of gastrointestinal tract, unspecified   . Hypertension   . Migraine   . Mitral regurgitation   . OA (osteoarthritis)   . Thyroid cyst     ROS:   All  systems reviewed and negative except as noted in the HPI.        Past Surgical History:  Procedure Laterality Date  . CATARACT EXTRACTION  1990   rt  . CATARACT EXTRACTION  2005   left  . LEFT HEART CATH AND CORONARY ANGIOGRAPHY N/A 10/06/2019   Procedure: LEFT HEART CATH AND CORONARY ANGIOGRAPHY;  Surgeon: Nigel Mormon, MD;  Location: [facility];  Service: Cardiovascular;  Laterality: N/A;  . REVISION TOTAL KNEE ARTHROPLASTY  2008  . THUMB ARTHROSCOPY    . TONSILLECTOMY       Family History  Problem Relation Age of Onset  . CVA Father 41  . Pancreatic cancer Mother   . CVA Sister 34  . Brain cancer Brother   . Arthritis Sister   . Heart disease Sister   . Breast cancer Neg Hx      Social History        Socioeconomic History  . Marital status: Widowed    Spouse name: Not on file  . Number of children: 2  . Years of education: Not on file  . Highest education level: Not on file  Occupational History  . Not on file  Tobacco Use  . Smoking status: Former Smoker  Years: 2.00    Types: Cigarettes    Quit date: 01/16/1948    Years since quitting: 71.9  . Smokeless tobacco: Never Used  . Tobacco comment: smoked on occ when she was a teen  Vaping Use  . Vaping Use: Never used  Substance and Sexual Activity  . Alcohol use: Not Currently    Comment: rarely has a glass of wine  . Drug use: No  . Sexual activity: Not on file  Other Topics Concern  . Not on file  Social History Narrative  . Not on file   Social Determinants of Health      Financial Resource Strain:   . Difficulty of Paying Living Expenses: Not on file  Food Insecurity:   . Worried About Charity fundraiser in the Last Year: Not on file  . Ran Out of Food in the Last Year: Not on file  Transportation Needs:   . Lack of Transportation (Medical): Not on file  . Lack of Transportation (Non-Medical): Not on file  Physical Activity:   . Days of  Exercise per Week: Not on file  . Minutes of Exercise per Session: Not on file  Stress:   . Feeling of Stress : Not on file  Social Connections:   . Frequency of Communication with Friends and Family: Not on file  . Frequency of Social Gatherings with Friends and Family: Not on file  . Attends Religious Services: Not on file  . Active Member of Clubs or Organizations: Not on file  . Attends Archivist Meetings: Not on file  . Marital Status: Not on file  Intimate Partner Violence:   . Fear of Current or Ex-Partner: Not on file  . Emotionally Abused: Not on file  . Physically Abused: Not on file  . Sexually Abused: Not on file     BP (!) 144/64   Pulse 74   Ht 5' 3"  (1.6 m)   Wt 154 lb 9.6 oz (70.1 kg)   SpO2 92%   BMI 27.39 kg/m   Physical Exam:  Well appearing NAD HEENT: Unremarkable Neck:  No JVD, no thyromegally Lymphatics:  No adenopathy Back:  No CVA tenderness Lungs:  Clear with no wheezes HEART:  Regular rate rhythm, 2/6 AS murmurs, no rubs, no clicks Abd:  soft, positive bowel sounds, no organomegally, no rebound, no guarding Ext:  2 plus pulses, no edema, no cyanosis, no clubbing Skin:  No rashes no nodules Neuro:  CN II through XII intact, motor grossly intact  EKG - NSR with QRS widening  Assess/Plan: 1. SVT - I have discussed the treatment options with the patient and the risks/benefits/goals/expectations of EP study and catheter ablation were reviewed and she wishes to reflect and will call us if she would like to proceed with ablation. Her conduction system disease makes ablation a reasonable option but a resulting PPM is more likely. She understands.  Donna [name]  EP Attending  Patient seen and examined. Agree with a bove. She understands that she could end up with PPM if she develops heart block during her procedure which is still not likely but a possibility.  Donna Overlie Katera Rybka,MD

## 2020-02-09 ENCOUNTER — Other Ambulatory Visit: Payer: Self-pay | Admitting: Internal Medicine

## 2020-02-09 DIAGNOSIS — E038 Other specified hypothyroidism: Secondary | ICD-10-CM

## 2020-02-09 DIAGNOSIS — E041 Nontoxic single thyroid nodule: Secondary | ICD-10-CM

## 2020-02-16 ENCOUNTER — Encounter: Payer: Self-pay | Admitting: Cardiology

## 2020-02-16 ENCOUNTER — Other Ambulatory Visit: Payer: Self-pay

## 2020-02-16 ENCOUNTER — Ambulatory Visit: Payer: Medicare PPO | Admitting: Cardiology

## 2020-02-16 VITALS — BP 150/63 | HR 82 | Temp 97.6°F | Resp 16 | Ht 63.0 in | Wt 157.2 lb

## 2020-02-16 DIAGNOSIS — I10 Essential (primary) hypertension: Secondary | ICD-10-CM

## 2020-02-16 DIAGNOSIS — I471 Supraventricular tachycardia: Secondary | ICD-10-CM | POA: Diagnosis not present

## 2020-02-16 DIAGNOSIS — N179 Acute kidney failure, unspecified: Secondary | ICD-10-CM | POA: Diagnosis not present

## 2020-02-16 DIAGNOSIS — M7989 Other specified soft tissue disorders: Secondary | ICD-10-CM

## 2020-02-16 DIAGNOSIS — Z87891 Personal history of nicotine dependence: Secondary | ICD-10-CM

## 2020-02-16 DIAGNOSIS — R002 Palpitations: Secondary | ICD-10-CM | POA: Diagnosis not present

## 2020-02-16 MED ORDER — LOSARTAN POTASSIUM 50 MG PO TABS: 50.0000 mg | ORAL_TABLET | Freq: Every morning | ORAL | 0 refills | Status: DC

## 2020-02-16 MED ORDER — CARVEDILOL 6.25 MG PO TABS: 6.2500 mg | ORAL_TABLET | Freq: Two times a day (BID) | ORAL | 0 refills | Status: DC

## 2020-02-16 NOTE — Progress Notes (Signed)
ID:  Donna [name], DOB 09-20-1927, MRN [id number]  PCP:  Gayland Curry, DO  Cardiologist:  Rex Kras, DO, [facility] (established care 08/26/2019)  Date: 02/16/20 Last Office Visit: 11/17/2019  Chief Complaint  Patient presents with  . Palpitations  . Follow-up    HPI  Donna [name] is a 87 y.o. female who presents to the office with a chief complaint of " 43-monthfollow-up for palpitations." Patient's past medical history and cardiovascular risk factors include: hypertension, supraventricular tachycardia, moderate aortic stenosis, mild LVOT obstruction, severe MR, migraine, GI bleeding, postmenopausal female, advanced age.  Patient is accompanied by her daughter (Manuela Schwartz at today's visit.  She is referred to the office at the request of her primary care provider for evaluation and management palpitations.  In the work-up of palpitations patient underwent an echocardiogram, stress test, and 14-day extended Holter monitor.  Given the continued symptoms of effort related dyspnea and chest pain she underwent a left heart catheterization which noted no significant epicardial coronary artery disease, no significant gradient on pullback, but had a 663mg mid cavitary gradient.  Given her symptoms of palpitations, SVT on the monitor, and mid cavitary gradient patient was started on calcium channel blocker and uptitrated for symptom management.  Beta-blockers not chosen as patient had recently lost her husband and was still grieving and did not want to contribute to her mood/depression.  Thereafter patient did not feel well and was found to be in junctional rhythm with acute kidney injury.  Patient was treated for her acute kidney injury and junctional rhythm and was discharged home on an immediate release Cardizem as opposed to extended release.  She followed up with Dr. TaLovena Lerom cardiac electrophysiology as outpatient and since then has undergone EP study as well as radiofrequency ablation of her  slow pathway since her last office visit.  Since the EP procedures patient states that she has been feeling better from a cardiovascular standpoint.  She does not appreciate the palpitations anymore.  She is less short of breath and has more energy to do her activities of daily living.  She has a follow-up appointment with Dr. TaLovena Leomorrow.   With regards to her hypertension patient states that she has been having lower extremity swelling and its most likely attributed to amlodipine.  She would like to transition off of amlodipine and go back to losartan that she was taking prior to her ED visit when she had AKI.   FUNCTIONAL STATUS: No structured exercise program or daily routine. 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    ALLERGIES: No Known Allergies  MEDICATION LIST PRIOR TO VISIT: Current Meds  Medication Sig  . Blood Pressure Monitoring KIT Essential hypertension check bp daily  . Calcium Carbonate-Vitamin D 600-400 MG-UNIT per tablet Take 1 tablet by mouth in the morning and at bedtime.  . carvedilol (COREG) 6.25 MG tablet Take 1 tablet (6.25 mg total) by mouth 2 (two) times daily. Hold if top blood pressure number less than 100 mmHg or heart rate less than 60 bpm (pulse).  . Coenzyme Q10 (COQ10) 400 MG CAPS Take 400 mg by mouth every evening.  . Marland Kitchenevothyroxine (SYNTHROID) 100 MCG tablet Take one tablet by mouth once daily 30 minutes before breakfast on empty stomach  . Light Mineral Oil-Mineral Oil (RETAINE MGD) 0.5-0.5 % EMUL Place 1 drop into both eyes in the morning and at bedtime.  . Marland Kitchenosartan (COZAAR) 50 MG tablet Take 1 tablet (  50 mg total) by mouth in the morning.  . Multiple Vitamins-Minerals (PRESERVISION AREDS 2) CAPS Take 1 capsule by mouth 2 (two) times a day.   Marland Kitchen OVER THE COUNTER MEDICATION Take 2 capsules by mouth daily. MitoQ  . traMADol (ULTRAM) 50 MG tablet Take 0.5 tablets (25 mg total) by mouth daily as needed for severe pain.  .  vitamin B-12 (CYANOCOBALAMIN) 1000 MCG tablet Take 1 tablet (1,000 mcg total) by mouth daily.  . [DISCONTINUED] amLODipine (NORVASC) 10 MG tablet Take 1 tablet (10 mg total) by mouth at bedtime.     PAST MEDICAL HISTORY: Past Medical History:  Diagnosis Date  . Aortic stenosis   . Hemorrhage of gastrointestinal tract, unspecified   . Hypertension   . Migraine   . Mitral regurgitation   . OA (osteoarthritis)   . Thyroid cyst     PAST SURGICAL HISTORY: Past Surgical History:  Procedure Laterality Date  . CATARACT EXTRACTION  1990   rt  . CATARACT EXTRACTION  2005   left  . LEFT HEART CATH AND CORONARY ANGIOGRAPHY N/A 10/06/2019   Procedure: LEFT HEART CATH AND CORONARY ANGIOGRAPHY;  Surgeon: Nigel Mormon, MD;  Location: [place] [facility];  Service: Cardiovascular;  Laterality: N/A;  . REVISION TOTAL KNEE ARTHROPLASTY  2008  . SVT ABLATION N/A 01/18/2020   Procedure: SVT ABLATION;  Surgeon: Evans Lance, MD;  Location: [place] [facility];  Service: Cardiovascular;  Laterality: N/A;  . THUMB ARTHROSCOPY    . TONSILLECTOMY      FAMILY HISTORY: The patient family history includes Arthritis in her sister; Brain cancer in her brother; CVA (age of onset: 23) in her sister; CVA (age of onset: 56) in her father; Heart disease in her sister; Pancreatic cancer in her mother.  SOCIAL HISTORY:  The patient  reports that she quit smoking about 72 years ago. Her smoking use included cigarettes. She quit after 2.00 years of use. She has never used smokeless tobacco. She reports previous alcohol use. She reports that she does not use drugs.  REVIEW OF SYSTEMS: Review of Systems  Constitutional: Negative for chills and fever.  HENT: Negative for hoarse voice and nosebleeds.   Eyes: Negative for discharge, double vision and pain.  Cardiovascular: Positive for dyspnea on exertion (improved). Negative for chest pain, claudication, leg swelling, near-syncope, orthopnea, palpitations,  paroxysmal nocturnal dyspnea and syncope.  Respiratory: Negative for hemoptysis and shortness of breath.   Musculoskeletal: Negative for muscle cramps and myalgias.  Gastrointestinal: Negative for abdominal pain, constipation, diarrhea, hematemesis, hematochezia, melena, nausea and vomiting.  Neurological: Negative for dizziness, light-headedness and vertigo.    PHYSICAL EXAM: Vitals with BMI 02/17/2020 02/16/2020 01/18/2020  Height 5' 3"  5' 3"  -  Weight 155 lbs 10 oz 157 lbs 3 oz -  BMI 93.90 30.09 -  Systolic 233 007 622  Diastolic 72 63 49  Pulse 82 82 72   CONSTITUTIONAL: Well-developed and well-nourished. No acute distress.  SKIN: Skin is warm and dry. No rash noted. No cyanosis. No pallor. No jaundice HEAD: Normocephalic and atraumatic.  EYES: No scleral icterus MOUTH/THROAT: Moist oral membranes.  NECK: No JVD present. No thyromegaly noted.  Bilateral carotid bruits most likely secondary to delayed carotid upstrokes LYMPHATIC: No visible cervical adenopathy.  CHEST Normal respiratory effort. No intercostal retractions  LUNGS: Clear to auscultation bilaterally.  No stridor. No wheezes. No rales.  CARDIOVASCULAR: Regular, positive S1 delayed S2, 3 out of 6 crescendo decrescendo murmur heard at the second  right intercostal space, with a component of holosystolic murmur at the apex. ABDOMINAL: Soft, nontender, nondistended, no apparent ascites.  EXTREMITIES: Trace bilateral peripheral edema.  HEMATOLOGIC: No significant bruising NEUROLOGIC: Oriented to person, place, and time. Nonfocal. Normal muscle tone.  PSYCHIATRIC: Normal mood and affect. Normal behavior. Cooperative  CARDIAC DATABASE: EKG: 08/26/2019:Sinus  Rhythm, 67bpm, normal axis, PRWP, possible old inferior infarct, nonspecific ST-T changes in high lateral leads (I and avL).  Echocardiogram: 09/09/2019:  Left ventricle cavity is normal in size. Severe concentric hypertrophy of the left ventricle. Normal global wall motion.  Normal LV systolic function with visual EF 50-55%. Doppler evidence of grade I (impaired) diastolic dysfunction, normal LAP.  Left atrial cavity is severely dilated.  Unable to clearly determine number of leaflets due to off axis view.  Moderate calcification. Mild aortic stenosis, in addition to mild LVOT obstruction. Aortic valve mean gradient of 18 mmHg, Vmax of 3.0 m/s. Calculated aortic valve area by continuity equation is 2 cm. Mild (grade I) aortic regurgitation. Moderate aortic stenosis. Moderate (Grade II) aortic regurgitation.  Severe (Grade III) mitral regurgitation.  Moderate tricuspid regurgitation. Estimated pulmonary artery systolic pressure 28 mmHg.   Stress Testing: None  Heart Catheterization: 10/06/2019: LM: Normal LAD: Mild luminal irregularities LCx: Normal RCA: Normal  LVEDP 14 mmHg 60 mmHg mid-cavitary gradient on pullback No significant LV-Ao gradient on pullback  Carotid artery duplex 09/09/2019:  Minimal stenosis in the right internal carotid artery (minimal).  Minimal stenosis in the left internal carotid artery (minimal).  Minimal plaque noted in the CCA bilaterally without significant stenosis.  Antegrade right vertebral artery flow. Antegrade left vertebral artery flow.  Follow up studies if clinically indicated.  7 day extended Holter monitor: Predominantly normal sinus rhythm (NSR).  HR 40-210 bpm.  Avg HR 79 bpm. Minimum HR 40 bpm on 08/29/19 at 1:02pm sinus with junctional escape. No atrial fibrillation/NSVT/pause (3 secs or longer). Total ventricular ectopic burden <1%. Total supraventricular ectopic burden <1%. 42 episodes of SVT.  Fastest and longest run was 1hr 17 mins with max HR 210 bpm (avg 155 bpm). Patient triggered events: 6.  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   LABORATORY DATA: CBC Latest Ref Rng & Units 01/11/2020 11/13/2019 11/12/2019  WBC 3.4 - 10.8 x10E3/uL  6.2 9.1 9.9  Hemoglobin 11.1 - 15.9 g/dL 12.6 11.3(L) 12.3  Hematocrit 34.0 - 46.6 % 37.0 35.1(L) 38.4  Platelets 150 - 450 x10E3/uL 187 159 184    CMP Latest Ref Rng & Units 01/11/2020 11/23/2019 11/19/2019  Glucose 65 - 99 mg/dL 73 85 87  BUN 10 - 36 mg/dL 20 23 20   Creatinine 0.57 - 1.00 mg/dL 0.81 0.74 0.95  Sodium 134 - 144 mmol/L 137 135 137  Potassium 3.5 - 5.2 mmol/L 4.6 4.5 5.3(H)  Chloride 96 - 106 mmol/L 99 102 98  CO2 20 - 29 mmol/L 22 25 20   Calcium 8.7 - 10.3 mg/dL 9.8 10.4 10.5(H)  Total Protein 6.5 - 8.1 g/dL - - -  Total Bilirubin 0.3 - 1.2 mg/dL - - -  Alkaline Phos 38 - 126 U/L - - -  AST 15 - 41 U/L - - -  ALT 0 - 44 U/L - - -    Lipid Panel     Component Value Date/Time   CHOL 112 12/08/2018 1004   CHOL 118 08/04/2014 0806   TRIG 80 12/08/2018 1004   HDL 34 (L) 12/08/2018 1004   HDL  34 (L) 08/04/2014 0806   CHOLHDL 3.3 12/08/2018 1004   VLDL 15 08/22/2016 0910   LDLCALC 62 12/08/2018 1004   LABVLDL 15 08/04/2014 0806    No components found for: NTPROBNP No results for input(s): PROBNP in the last 8760 hours. Recent Labs    06/04/19 0810 11/12/19 1027  TSH 3.69 2.77    BMP Recent Labs    10/06/19 1303 11/12/19 2352 11/14/19 0133 11/19/19 1041 11/23/19 1533 01/11/20 1007  NA 136   < > 133* 137 135 137  K 4.9   < > 5.1 5.3* 4.5 4.6  CL 103   < > 103 98 102 99  CO2 24   < > 24 20 25 22   GLUCOSE 91   < > 96 87 85 73  BUN 13   < > 49* 20 23 20   CREATININE 0.74   < > 1.45* 0.95 0.74 0.81  CALCIUM 9.5   < > 9.3 10.5* 10.4 9.8  GFRNONAA >60   < > 34* 53*  --  64  GFRAA >60  --   --  61  --  74   < > = values in this interval not displayed.    HEMOGLOBIN A1C Lab Results  Component Value Date   HGBA1C 5.1 08/22/2016   MPG 100 08/22/2016    IMPRESSION:    ICD-10-CM   1. SVT (supraventricular tachycardia) (HCC)  I47.1 carvedilol (COREG) 6.25 MG tablet  2. Palpitations  R00.2   3. Essential hypertension, benign  W43 Basic metabolic  panel    Magnesium    carvedilol (COREG) 6.25 MG tablet    Magnesium    Basic metabolic panel  4. Former smoker  Z87.891   5. Leg swelling  M79.89 B Nat Peptide    B Nat Peptide  6. AKI (acute kidney injury) ([place])  N17.9 Magnesium    Basic metabolic panel     RECOMMENDATIONS: Shala Baumbach is a 87 y.o. female whose past medical history and cardiac risk factors include: hypertension, supraventricular tachycardia, aortic stenosis, Mid cavitary gradient, severe MR, migraine, GI bleeding, postmenopausal female, advanced age.  Supraventricular tachycardia:  Status post EP study/of the frequency ablation of the slow pathway with Dr. Lovena Le  Refill carvedilol.   Aortic stenosis:  Patient is scheduled for another echocardiogram in February 2022 to reevaluate the severity and progression of aortic stenosis at [facility].  No significant LV-AO gradient on pullback via invasive angiography  Patient is aware to seek medical attention if she has symptoms of chest pain, syncope, heart failure.  Continue to monitor.  Severe MR:  Discontinue amlodipine.  Start losartan.  Blood work in 1 week to evaluate kidney function.  Continue to monitor.  Former smoker: Educated on the importance of continued smoking cessation.  Recommended 39-monthfollow-up.  However, patient would like to follow-up in 3 months to make sure that her symptoms are not well controlled and to address any forthcoming issues.  FINAL MEDICATION LIST END OF ENCOUNTER: Meds ordered this encounter  Medications  . losartan (COZAAR) 50 MG tablet    Sig: Take 1 tablet (50 mg total) by mouth in the morning.    Dispense:  90 tablet    Refill:  0  . carvedilol (COREG) 6.25 MG tablet    Sig: Take 1 tablet (6.25 mg total) by mouth 2 (two) times daily. Hold if top blood pressure number less than 100 mmHg or heart rate less than 60 bpm (pulse).  Dispense:  180 tablet    Refill:  0    Medications Discontinued  During This Encounter  Medication Reason  . amLODipine (NORVASC) 10 MG tablet Change in therapy     Current Outpatient Medications:  .  Blood Pressure Monitoring KIT, Essential hypertension check bp daily, Disp: 1 kit, Rfl: 0 .  Calcium Carbonate-Vitamin D 600-400 MG-UNIT per tablet, Take 1 tablet by mouth in the morning and at bedtime., Disp: , Rfl:  .  carvedilol (COREG) 6.25 MG tablet, Take 1 tablet (6.25 mg total) by mouth 2 (two) times daily. Hold if top blood pressure number less than 100 mmHg or heart rate less than 60 bpm (pulse)., Disp: 180 tablet, Rfl: 0 .  Coenzyme Q10 (COQ10) 400 MG CAPS, Take 400 mg by mouth every evening., Disp: , Rfl:  .  levothyroxine (SYNTHROID) 100 MCG tablet, Take one tablet by mouth once daily 30 minutes before breakfast on empty stomach, Disp: 90 tablet, Rfl: 1 .  Light Mineral Oil-Mineral Oil (RETAINE MGD) 0.5-0.5 % EMUL, Place 1 drop into both eyes in the morning and at bedtime., Disp: , Rfl:  .  losartan (COZAAR) 50 MG tablet, Take 1 tablet (50 mg total) by mouth in the morning., Disp: 90 tablet, Rfl: 0 .  Multiple Vitamins-Minerals (PRESERVISION AREDS 2) CAPS, Take 1 capsule by mouth 2 (two) times a day. , Disp: , Rfl:  .  OVER THE COUNTER MEDICATION, Take 2 capsules by mouth daily. MitoQ, Disp: , Rfl:  .  traMADol (ULTRAM) 50 MG tablet, Take 0.5 tablets (25 mg total) by mouth daily as needed for severe pain., Disp: 15 tablet, Rfl: 0 .  vitamin B-12 (CYANOCOBALAMIN) 1000 MCG tablet, Take 1 tablet (1,000 mcg total) by mouth daily., Disp: 30 tablet, Rfl: 5  Orders Placed This Encounter  Procedures  . Basic metabolic panel  . Magnesium  . B Nat Peptide   There are no Patient Instructions on file for this visit.   --Continue cardiac medications as reconciled in final medication list. --Return in about 3 months (around 05/15/2020) for Follow up, Dyspnea, LE swelling, review echo. Or sooner if needed. --Continue follow-up with your primary care physician  regarding the management of your other chronic comorbid conditions.  Patient's questions and concerns were addressed to her satisfaction. She voices understanding of the instructions provided during this encounter.   This note was created using a voice recognition software as a result there may be grammatical errors inadvertently enclosed that do not reflect the nature of this encounter. Every attempt is made to correct such errors.  Rex Kras, [place], [facility]  Pager: [phone] Office: [phone]

## 2020-02-17 ENCOUNTER — Encounter: Payer: Self-pay | Admitting: Internal Medicine

## 2020-02-17 ENCOUNTER — Ambulatory Visit: Payer: Medicare PPO | Admitting: Internal Medicine

## 2020-02-17 VITALS — BP 128/72 | HR 82 | Ht 63.0 in | Wt 155.6 lb

## 2020-02-17 DIAGNOSIS — L565 Disseminated superficial actinic porokeratosis (DSAP): Secondary | ICD-10-CM | POA: Diagnosis not present

## 2020-02-17 DIAGNOSIS — I471 Supraventricular tachycardia: Secondary | ICD-10-CM | POA: Diagnosis not present

## 2020-02-17 NOTE — Progress Notes (Signed)
HPI Donna [name] returns today for followup. She is a pleasant 87 yo woman with SVT who underwent EP study and catheter ablation several weeks ago. At the time of her procedure she had only NS SVT and evidence of dual AV nodal physiology. She had no other sustained arrhythmias. She underwent slow pathway modification. In the interim, she notes that she feels well and has not had any symptomatic arrhythmias. Because of leg swelling her amlodipine was stopped and she was started on cozaar.  No Known Allergies   Current Outpatient Medications  Medication Sig Dispense Refill  . Blood Pressure Monitoring KIT Essential hypertension check bp daily 1 kit 0  . Calcium Carbonate-Vitamin D 600-400 MG-UNIT per tablet Take 1 tablet by mouth in the morning and at bedtime.    . carvedilol (COREG) 6.25 MG tablet Take 1 tablet (6.25 mg total) by mouth 2 (two) times daily. Hold if top blood pressure number less than 100 mmHg or heart rate less than 60 bpm (pulse). 180 tablet 0  . Coenzyme Q10 (COQ10) 400 MG CAPS Take 400 mg by mouth every evening.    Marland Kitchen levothyroxine (SYNTHROID) 100 MCG tablet Take one tablet by mouth once daily 30 minutes before breakfast on empty stomach 90 tablet 1  . Light Mineral Oil-Mineral Oil (RETAINE MGD) 0.5-0.5 % EMUL Place 1 drop into both eyes in the morning and at bedtime.    Marland Kitchen losartan (COZAAR) 50 MG tablet Take 1 tablet (50 mg total) by mouth in the morning. 90 tablet 0  . Multiple Vitamins-Minerals (PRESERVISION AREDS 2) CAPS Take 1 capsule by mouth 2 (two) times a day.     Marland Kitchen OVER THE COUNTER MEDICATION Take 2 capsules by mouth daily. MitoQ    . traMADol (ULTRAM) 50 MG tablet Take 0.5 tablets (25 mg total) by mouth daily as needed for severe pain. 15 tablet 0  . vitamin B-12 (CYANOCOBALAMIN) 1000 MCG tablet Take 1 tablet (1,000 mcg total) by mouth daily. 30 tablet 5   No current facility-administered medications for this visit.     Past Medical History:  Diagnosis Date   . Aortic stenosis   . Hemorrhage of gastrointestinal tract, unspecified   . Hypertension   . Migraine   . Mitral regurgitation   . OA (osteoarthritis)   . Thyroid cyst     ROS:   All systems reviewed and negative except as noted in the HPI.   Past Surgical History:  Procedure Laterality Date  . CATARACT EXTRACTION  1990   rt  . CATARACT EXTRACTION  2005   left  . LEFT HEART CATH AND CORONARY ANGIOGRAPHY N/A 10/06/2019   Procedure: LEFT HEART CATH AND CORONARY ANGIOGRAPHY;  Surgeon: Nigel Mormon, MD;  Location: [place] [facility];  Service: Cardiovascular;  Laterality: N/A;  . REVISION TOTAL KNEE ARTHROPLASTY  2008  . SVT ABLATION N/A 01/18/2020   Procedure: SVT ABLATION;  Surgeon: Evans Lance, MD;  Location: [place] [facility];  Service: Cardiovascular;  Laterality: N/A;  . THUMB ARTHROSCOPY    . TONSILLECTOMY       Family History  Problem Relation Age of Onset  . CVA Father 59  . Pancreatic cancer Mother   . CVA Sister 86  . Brain cancer Brother   . Arthritis Sister   . Heart disease Sister   . Breast cancer Neg Hx      Social History   Socioeconomic History  . Marital status: Widowed  Spouse name: Not on file  . Number of children: 2  . Years of education: Not on file  . Highest education level: Not on file  Occupational History  . Not on file  Tobacco Use  . Smoking status: Former Smoker    Years: 2.00    Types: Cigarettes    Quit date: 01/16/1948    Years since quitting: 72.1  . Smokeless tobacco: Never Used  . Tobacco comment: smoked on occ when she was a teen  Vaping Use  . Vaping Use: Never used  Substance and Sexual Activity  . Alcohol use: Not Currently    Comment: rarely has a glass of wine  . Drug use: No  . Sexual activity: Not on file  Other Topics Concern  . Not on file  Social History Narrative  . Not on file   Social Determinants of Health   Financial Resource Strain: Not on file  Food Insecurity: Not on file   Transportation Needs: Not on file  Physical Activity: Not on file  Stress: Not on file  Social Connections: Not on file  Intimate Partner Violence: Not on file     BP 128/72   Pulse 82   Ht _0  (1.6 m)   Wt 155 lb 9.6 oz (70.6 kg)   SpO2 97%   BMI 27.56 kg/m   Physical Exam:  Well appearing NAD HEENT: Unremarkable Neck:  No JVD, no thyromegally Lymphatics:  No adenopathy Back:  No CVA tenderness Lungs:  Clear with no wheezes HEART:  Regular rate rhythm, no murmurs, no rubs, no clicks Abd:  soft, positive bowel sounds, no organomegally, no rebound, no guarding Ext:  2 plus pulses, no edema, no cyanosis, no clubbing Skin:  No rashes no nodules Neuro:  CN II through XII intact, motor grossly intact  EKG - nsr  Assess/Plan: 1. SVT - she is s/p EPS/RFA with slow pathway modification. In the interim, she notes no arrhythmias. She will undergo watchful waiting. 2. HTN - her bp is ok today and I see no edema.  3. Disp. - I will see her back as needed.  Carleene Overlie Clent Damore,MD

## 2020-02-17 NOTE — Patient Instructions (Addendum)
Medication Instructions:  Your physician recommends that you continue on your current medications as directed. Please refer to the Current Medication list given to you today.  Labwork: None ordered.  Testing/Procedures: None ordered.  Follow-Up: Your physician wants you to follow-up in: as needed with Gregg Taylor, MD    Any Other Special Instructions Will Be Listed Below (If Applicable).  If you need a refill on your cardiac medications before your next appointment, please call your pharmacy.       

## 2020-02-26 DIAGNOSIS — N179 Acute kidney failure, unspecified: Secondary | ICD-10-CM | POA: Diagnosis not present

## 2020-02-27 LAB — BASIC METABOLIC PANEL
BUN/Creatinine Ratio: 19 (ref 12–28)
BUN: 16 mg/dL (ref 10–36)
CO2: 23 mmol/L (ref 20–29)
Calcium: 10.2 mg/dL (ref 8.7–10.3)
Chloride: 102 mmol/L (ref 96–106)
Creatinine, Ser: 0.83 mg/dL (ref 0.57–1.00)
GFR calc Af Amer: 72 mL/min/{1.73_m2} (ref >59)
GFR calc non Af Amer: 62 mL/min/{1.73_m2} (ref >59)
Glucose: 105 mg/dL — ABNORMAL HIGH (ref 65–99)
Potassium: 4.5 mmol/L (ref 3.5–5.2)
Sodium: 141 mmol/L (ref 134–144)

## 2020-02-27 LAB — MAGNESIUM: Magnesium: 2.2 mg/dL (ref 1.6–2.3)

## 2020-02-27 LAB — BRAIN NATRIURETIC PEPTIDE: BNP: 312.9 pg/mL — ABNORMAL HIGH (ref 0.0–100.0)

## 2020-02-29 NOTE — Progress Notes (Signed)
No answer left a vm will try again later

## 2020-03-01 NOTE — Progress Notes (Signed)
Called and spoke with pt regarding lab results. Pt voiced understanding. Pt said she is not having any swelling or shortness of breath. She did say that her bp has been higher than usual and that her heart rate is usually below 60 since starting Carvedilol.

## 2020-03-02 DIAGNOSIS — H353114 Nonexudative age-related macular degeneration, right eye, advanced atrophic with subfoveal involvement: Secondary | ICD-10-CM | POA: Diagnosis not present

## 2020-03-02 DIAGNOSIS — H43813 Vitreous degeneration, bilateral: Secondary | ICD-10-CM | POA: Diagnosis not present

## 2020-03-02 DIAGNOSIS — H353123 Nonexudative age-related macular degeneration, left eye, advanced atrophic without subfoveal involvement: Secondary | ICD-10-CM | POA: Diagnosis not present

## 2020-03-07 ENCOUNTER — Encounter: Payer: Self-pay | Admitting: Internal Medicine

## 2020-03-11 ENCOUNTER — Other Ambulatory Visit: Payer: Self-pay | Admitting: Cardiology

## 2020-03-11 DIAGNOSIS — I080 Rheumatic disorders of both mitral and aortic valves: Secondary | ICD-10-CM

## 2020-03-11 DIAGNOSIS — I471 Supraventricular tachycardia: Secondary | ICD-10-CM

## 2020-03-16 ENCOUNTER — Telehealth: Payer: Self-pay

## 2020-03-16 NOTE — Telephone Encounter (Signed)
Pt called stating that her BP has been high ever since she started taking the carvedilol. She said her systolic is usually between 150-170, and her diastolic is usually over 100.

## 2020-03-21 ENCOUNTER — Other Ambulatory Visit: Payer: Self-pay

## 2020-03-21 ENCOUNTER — Other Ambulatory Visit: Payer: Self-pay | Admitting: Cardiology

## 2020-03-21 DIAGNOSIS — R002 Palpitations: Secondary | ICD-10-CM

## 2020-03-21 DIAGNOSIS — I10 Essential (primary) hypertension: Secondary | ICD-10-CM

## 2020-03-21 DIAGNOSIS — I471 Supraventricular tachycardia: Secondary | ICD-10-CM

## 2020-03-21 MED ORDER — LOSARTAN POTASSIUM 100 MG PO TABS: 100.0000 mg | ORAL_TABLET | Freq: Every day | ORAL | 0 refills | Status: DC

## 2020-03-21 NOTE — Telephone Encounter (Signed)
Increase losartan to 100 mg p.o. daily. Please place blood work for 1 week to have her check BMP and magnesium level.

## 2020-03-21 NOTE — Telephone Encounter (Signed)
Patient didn't answer left vm. If patient calls back ask what pharmacy she needs [place] to send her losartan, everything has been done rx has been increase and labs have been added and released already just need to make patient aware

## 2020-03-21 NOTE — Telephone Encounter (Signed)
Patient returned phone call she uses [facility]. She is aware of the labs

## 2020-03-22 DIAGNOSIS — I10 Essential (primary) hypertension: Secondary | ICD-10-CM | POA: Diagnosis not present

## 2020-03-22 DIAGNOSIS — I471 Supraventricular tachycardia: Secondary | ICD-10-CM | POA: Diagnosis not present

## 2020-03-22 DIAGNOSIS — R002 Palpitations: Secondary | ICD-10-CM | POA: Diagnosis not present

## 2020-03-23 DIAGNOSIS — L0109 Other impetigo: Secondary | ICD-10-CM | POA: Diagnosis not present

## 2020-03-23 DIAGNOSIS — Z0189 Encounter for other specified special examinations: Secondary | ICD-10-CM | POA: Diagnosis not present

## 2020-03-23 DIAGNOSIS — L738 Other specified follicular disorders: Secondary | ICD-10-CM | POA: Diagnosis not present

## 2020-03-23 LAB — BASIC METABOLIC PANEL
BUN/Creatinine Ratio: 17 (ref 12–28)
BUN: 13 mg/dL (ref 10–36)
CO2: 24 mmol/L (ref 20–29)
Calcium: 9.8 mg/dL (ref 8.7–10.3)
Chloride: 100 mmol/L (ref 96–106)
Creatinine, Ser: 0.77 mg/dL (ref 0.57–1.00)
Glucose: 99 mg/dL (ref 65–99)
Potassium: 5 mmol/L (ref 3.5–5.2)
Sodium: 137 mmol/L (ref 134–144)
eGFR: 73 mL/min/{1.73_m2} (ref >59)

## 2020-03-23 LAB — MAGNESIUM: Magnesium: 2.1 mg/dL (ref 1.6–2.3)

## 2020-03-24 ENCOUNTER — Ambulatory Visit (HOSPITAL_COMMUNITY)
Admission: RE | Admit: 2020-03-24 | Discharge: 2020-03-24 | Disposition: A | Discharge status: Home / Self Care | Payer: Medicare PPO | Source: Ambulatory Visit | Attending: Cardiology | Admitting: Cardiology

## 2020-03-24 ENCOUNTER — Other Ambulatory Visit: Payer: Self-pay

## 2020-03-24 DIAGNOSIS — I351 Nonrheumatic aortic (valve) insufficiency: Secondary | ICD-10-CM

## 2020-03-24 DIAGNOSIS — I080 Rheumatic disorders of both mitral and aortic valves: Secondary | ICD-10-CM | POA: Diagnosis not present

## 2020-03-24 DIAGNOSIS — I352 Nonrheumatic aortic (valve) stenosis with insufficiency: Secondary | ICD-10-CM | POA: Insufficient documentation

## 2020-03-24 DIAGNOSIS — Z87891 Personal history of nicotine dependence: Secondary | ICD-10-CM | POA: Diagnosis not present

## 2020-03-24 DIAGNOSIS — I119 Hypertensive heart disease without heart failure: Secondary | ICD-10-CM | POA: Diagnosis not present

## 2020-03-24 DIAGNOSIS — I10 Essential (primary) hypertension: Secondary | ICD-10-CM | POA: Insufficient documentation

## 2020-03-24 NOTE — Progress Notes (Signed)
  Echocardiogram 2D Echocardiogram has been performed.  Donna [name] 03/24/2020, 1:49 PM

## 2020-03-28 DIAGNOSIS — I351 Nonrheumatic aortic (valve) insufficiency: Secondary | ICD-10-CM | POA: Insufficient documentation

## 2020-03-28 LAB — ECHOCARDIOGRAM COMPLETE
AV Mean grad: 25 mmHg
AV Peak grad: 38.9 mmHg
Ao pk vel: 3.12 m/s
Area-P 1/2: 2.99 cm2
Calc EF: 55.9 %
MV M vel: 7.06 m/s
MV Peak grad: 199.4 mmHg
P 1/2 time: 594 msec
S' Lateral: 2.9 cm
Single Plane A2C EF: 63.4 %
Single Plane A4C EF: 41 %

## 2020-03-28 NOTE — Progress Notes (Signed)
Called and spoke to pt regarding lab results. Pt voiced understanding. Pt stated that since losartan was doubled, her bp has not come down

## 2020-03-31 DIAGNOSIS — L0889 Other specified local infections of the skin and subcutaneous tissue: Secondary | ICD-10-CM | POA: Diagnosis not present

## 2020-03-31 DIAGNOSIS — L011 Impetiginization of other dermatoses: Secondary | ICD-10-CM | POA: Diagnosis not present

## 2020-04-06 DIAGNOSIS — L308 Other specified dermatitis: Secondary | ICD-10-CM | POA: Diagnosis not present

## 2020-04-07 NOTE — Progress Notes (Signed)
Called and spoke with patient regarding her echocardiogram results. Patient understands that when she comes to her appointment, echocardiogram results will be discussed in detail.

## 2020-04-20 DIAGNOSIS — L565 Disseminated superficial actinic porokeratosis (DSAP): Secondary | ICD-10-CM | POA: Diagnosis not present

## 2020-04-21 ENCOUNTER — Ambulatory Visit: Payer: Medicare PPO | Admitting: Internal Medicine

## 2020-04-22 ENCOUNTER — Other Ambulatory Visit: Payer: Self-pay

## 2020-04-22 ENCOUNTER — Encounter: Payer: Self-pay | Admitting: Nurse Practitioner

## 2020-04-22 ENCOUNTER — Ambulatory Visit: Payer: Medicare PPO | Admitting: Nurse Practitioner

## 2020-04-22 DIAGNOSIS — E538 Deficiency of other specified B group vitamins: Secondary | ICD-10-CM

## 2020-04-22 DIAGNOSIS — G8929 Other chronic pain: Secondary | ICD-10-CM | POA: Diagnosis not present

## 2020-04-22 DIAGNOSIS — E039 Hypothyroidism, unspecified: Secondary | ICD-10-CM

## 2020-04-22 DIAGNOSIS — I350 Nonrheumatic aortic (valve) stenosis: Secondary | ICD-10-CM

## 2020-04-22 DIAGNOSIS — R609 Edema, unspecified: Secondary | ICD-10-CM

## 2020-04-22 DIAGNOSIS — M545 Low back pain, unspecified: Secondary | ICD-10-CM | POA: Diagnosis not present

## 2020-04-22 NOTE — Progress Notes (Signed)
Careteam: Patient Care Team: Donna Chandler, NP as PCP - General (Geriatric Medicine) Virgina [name], OD as Consulting Physician (Optometry) Donna [name]., MD as Referring Physician (Orthopedic Surgery)  PLACE OF SERVICE:  [place] Directive information Does Patient Have a Medical Advance Directive?: Yes, Type of Advance Directive: Out of facility DNR (pink MOST or yellow form);Healthcare Power of Attorney, Pre-existing out of facility DNR order (yellow form or pink MOST form): Yellow form placed in chart (order not valid for inpatient use), Does patient want to make changes to medical advance directive?: No - Patient declined  No Known Allergies  Chief Complaint  Patient presents with  . Medical Management of Chronic Issues    4 month follow-up      HPI: Patient is a 87 y.o. female here for routine follow up.   Lives alone, grandaughter comes and stays with her occasionally. Eats a good diet but unable to exercise due to her low energy level. Does take 1000 mg of vitamin b12 daily.  Had ablation in January, followed by cardiology. No chest pain, has occasional shortness of breath on exertion.   Chronic back pain- has been going on for a long time. No numbness or tingling.  Had prn tramadol but doesn't help with pain and causes dizziness. Takes tylenol prn with no relief. Does not sleep well due to the pain.  Hypothyroidism- taking levothyroxine daily    Review of Systems:  Review of Systems  Constitutional: Negative for chills, fever and weight loss.  HENT: Negative for tinnitus.   Respiratory: Negative for cough, sputum production and shortness of breath (on exertion).   Cardiovascular: Negative for chest pain, palpitations and leg swelling.  Gastrointestinal: Negative for abdominal pain, constipation, nausea and vomiting.  Genitourinary: Negative for dysuria, frequency and urgency.  Musculoskeletal: Positive for back pain. Negative for falls and  myalgias.  Skin: Negative.   Neurological: Negative for dizziness, weakness and headaches.  Psychiatric/Behavioral: Negative for depression and memory loss. The patient does not have insomnia.     Past Medical History:  Diagnosis Date  . Aortic stenosis   . Hemorrhage of gastrointestinal tract, unspecified   . Hypertension   . Migraine   . Mitral regurgitation   . OA (osteoarthritis)   . Thyroid cyst    Past Surgical History:  Procedure Laterality Date  . CATARACT EXTRACTION  1990   rt  . CATARACT EXTRACTION  2005   left  . LEFT HEART CATH AND CORONARY ANGIOGRAPHY N/A 10/06/2019   Procedure: LEFT HEART CATH AND CORONARY ANGIOGRAPHY;  Surgeon: Nigel Mormon, MD;  Location: [facility];  Service: Cardiovascular;  Laterality: N/A;  . REVISION TOTAL KNEE ARTHROPLASTY  2008  . SVT ABLATION N/A 01/18/2020   Procedure: SVT ABLATION;  Surgeon: Evans Lance, MD;  Location: [facility];  Service: Cardiovascular;  Laterality: N/A;  . THUMB ARTHROSCOPY    . TONSILLECTOMY     Social History:   reports that she quit smoking about 72 years ago. Her smoking use included cigarettes. She quit after 2.00 years of use. She has never used smokeless tobacco. She reports previous alcohol use. She reports that she does not use drugs.  Family History  Problem Relation Age of Onset  . CVA Father 49  . Pancreatic cancer Mother   . CVA Sister 18  . Brain cancer Brother   . Arthritis Sister   . Heart disease Sister   . Breast cancer Neg  Hx     Medications: Patient's Medications  New Prescriptions   No medications on file  Previous Medications   BLOOD PRESSURE MONITORING KIT    Essential hypertension check bp daily   CALCIUM CARBONATE-VITAMIN D 600-400 MG-UNIT PER TABLET    Take 1 tablet by mouth in the morning and at bedtime.   CARVEDILOL (COREG) 6.25 MG TABLET    Take 1 tablet (6.25 mg total) by mouth 2 (two) times daily. Hold if top blood pressure number less than 100  mmHg or heart rate less than 60 bpm (pulse).   COENZYME Q10 (COQ10) 400 MG CAPS    Take 400 mg by mouth every evening.   LEVOTHYROXINE (SYNTHROID) 100 MCG TABLET    Take one tablet by mouth once daily 30 minutes before breakfast on empty stomach   LIGHT MINERAL OIL-MINERAL OIL (RETAINE MGD) 0.5-0.5 % EMUL    Place 1 drop into both eyes in the morning and at bedtime.   LOSARTAN (COZAAR) 100 MG TABLET    TAKE 1 TABLET(100 MG) BY MOUTH DAILY   MULTIPLE VITAMINS-MINERALS (PRESERVISION AREDS 2) CAPS    Take 1 capsule by mouth 2 (two) times a day.    OVER THE COUNTER MEDICATION    Take 2 capsules by mouth daily. MitoQ   TRAMADOL (ULTRAM) 50 MG TABLET    Take 0.5 tablets (25 mg total) by mouth daily as needed for severe pain.   VITAMIN B-12 (CYANOCOBALAMIN) 1000 MCG TABLET    Take 1 tablet (1,000 mcg total) by mouth daily.  Modified Medications   No medications on file  Discontinued Medications   No medications on file    Physical Exam:  Vitals:   04/22/20 1332  BP: 118/76  Pulse: 77  Temp: (!) 96.9 F (36.1 C)  TempSrc: Temporal  SpO2: 96%  Weight: 154 lb (69.9 kg)  Height: 5' 3" (1.6 m)   Body mass index is 27.28 kg/m. Wt Readings from Last 3 Encounters:  04/22/20 154 lb (69.9 kg)  02/17/20 155 lb 9.6 oz (70.6 kg)  02/16/20 157 lb 3.2 oz (71.3 kg)    Physical Exam Constitutional:      General: She is not in acute distress.    Appearance: Normal appearance. She is not diaphoretic.  HENT:     Head: Normocephalic and atraumatic.     Mouth/Throat:     Pharynx: No oropharyngeal exudate.  Eyes:     Conjunctiva/sclera: Conjunctivae normal.     Pupils: Pupils are equal, round, and reactive to light.  Cardiovascular:     Rate and Rhythm: Normal rate and regular rhythm.     Heart sounds: Murmur heard.    Pulmonary:     Effort: Pulmonary effort is normal.     Breath sounds: Normal breath sounds.  Abdominal:     General: Bowel sounds are normal.     Palpations: Abdomen is  soft.  Musculoskeletal:        General: No swelling or tenderness. Normal range of motion.  Skin:    General: Skin is warm and dry.  Neurological:     Mental Status: She is alert and oriented to person, place, and time.     Labs reviewed: Basic Metabolic Panel: Recent Labs    06/04/19 0810 10/06/19 1303 11/12/19 1027 11/12/19 2352 11/19/19 1040 11/19/19 1041 01/11/20 1007 02/26/20 1451 03/22/20 1115  NA 137   < >  --    < >  --    < > 137 141   137  K 5.0   < >  --    < >  --    < > 4.6 4.5 5.0  CL 102   < >  --    < >  --    < > 99 102 100  CO2 28   < >  --    < >  --    < > _0 GLUCOSE 99   < >  --    < >  --    < > 73 105* 99  BUN 15   < >  --    < >  --    < > _1 CREATININE 0.89*   < >  --    < >  --    < > 0.81 0.83 0.77  CALCIUM 10.3   < >  --    < >  --    < > 9.8 10.2 9.8  MG  --   --   --    < > 2.1  --   --  2.2 2.1  TSH 3.69  --  2.77  --   --   --   --   --   --    < > = values in this interval not displayed.   Liver Function Tests: Recent Labs    06/04/19 0810 11/12/19 2352 11/13/19 0547  AST _2 ALT 18 64* 52*  ALKPHOS  --  47 41  BILITOT 0.5 0.8 1.1  PROT 6.0* 6.2* 5.6*  ALBUMIN  --  4.1 3.7   No results for input(s): LIPASE, AMYLASE in the last 8760 hours. No results for input(s): AMMONIA in the last 8760 hours. CBC: Recent Labs    11/12/19 2352 11/13/19 0547 01/11/20 1007  WBC 9.9 9.1 6.2  NEUTROABS 7.1 6.3 3.2  HGB 12.3 11.3* 12.6  HCT 38.4 35.1* 37.0  MCV 93.0 93.1 90  PLT 184 159 187   Lipid Panel: No results for input(s): CHOL, HDL, LDLCALC, TRIG, CHOLHDL, LDLDIRECT in the last 8760 hours. TSH: Recent Labs    06/04/19 0810 11/12/19 1027  TSH 3.69 2.77   A1C: Lab Results  Component Value Date   HGBA1C 5.1 08/22/2016     Assessment/Plan 1. B12 deficiency Had a low b12 level back in 2018, will recheck today. Continue taking 1000 mg of vit b12 daily. - CBC with Differential/Platelet - Vitamin  B12  2. Aortic valve stenosis, etiology of cardiac valve disease unspecified Followed by cardiology, no chest pains noted.   3. Edema, unspecified type Resolved, continue elevating lower extremities.   4. Hypothyroidism, unspecified type Last TSH was within goal, will recheck today due to low energy level. Continue taking levothyroxine daily - TSH  5. Chronic bilateral low back pain without sciatica Chronic pain in lower back for many years. Has tried tylenol with no relief. Will order x-ray at this time and possible referral to neurosurgery for injections. Daughter is physical therapist and has already worked with her on proper exercise and body mechanics to help with pain.  - DG Lumbar Spine Complete; Future   Next appt: 4 months Eufemio Strahm K. [place], [facility] [phone]

## 2020-04-23 LAB — CBC WITH DIFFERENTIAL/PLATELET
Absolute Monocytes: 756 cells/uL (ref 200–950)
Basophils Absolute: 31 cells/uL (ref 0–200)
Basophils Relative: 0.5 %
Eosinophils Absolute: 180 cells/uL (ref 15–500)
Eosinophils Relative: 2.9 %
HCT: 42.3 % (ref 35.0–45.0)
Hemoglobin: 13.6 g/dL (ref 11.7–15.5)
Lymphs Abs: 1407 cells/uL (ref 850–3900)
MCH: 27.9 pg (ref 27.0–33.0)
MCHC: 32.2 g/dL (ref 32.0–36.0)
MCV: 86.7 fL (ref 80.0–100.0)
MPV: 10.1 fL (ref 7.5–12.5)
Monocytes Relative: 12.2 %
Neutro Abs: 3825 cells/uL (ref 1500–7800)
Neutrophils Relative %: 61.7 %
Platelets: 195 10*3/uL (ref 140–400)
RBC: 4.88 10*6/uL (ref 3.80–5.10)
RDW: 12.8 % (ref 11.0–15.0)
Total Lymphocyte: 22.7 %
WBC: 6.2 10*3/uL (ref 3.8–10.8)

## 2020-04-23 LAB — VITAMIN B12: Vitamin B-12: 1051 pg/mL (ref 200–1100)

## 2020-04-23 LAB — TSH: TSH: 3.78 mIU/L (ref 0.40–4.50)

## 2020-04-25 ENCOUNTER — Telehealth: Payer: Self-pay

## 2020-04-25 NOTE — Telephone Encounter (Signed)
Patient wanted to know where Donna Chatters NP wanted her to go for her X-ray's she had ordered I told her usually we send patients to [facility] and I gave her the phone number to speak with them and find out if she needed an appointment or not

## 2020-04-26 ENCOUNTER — Ambulatory Visit
Admission: RE | Admit: 2020-04-26 | Discharge: 2020-04-26 | Disposition: A | Discharge status: Home / Self Care | Payer: Medicare PPO | Source: Ambulatory Visit | Attending: Nurse Practitioner | Admitting: Nurse Practitioner

## 2020-04-26 DIAGNOSIS — M545 Low back pain, unspecified: Secondary | ICD-10-CM | POA: Diagnosis not present

## 2020-04-26 DIAGNOSIS — G8929 Other chronic pain: Secondary | ICD-10-CM

## 2020-04-26 IMAGING — CR DG LUMBAR SPINE COMPLETE 4+V
5 series · 5 of 5 positions shown · non-contrast
Comparison: None.

CLINICAL DATA: Chronic low back pain

EXAM:
LUMBAR SPINE - COMPLETE 4+ VIEW

[t lumbar spine ap]
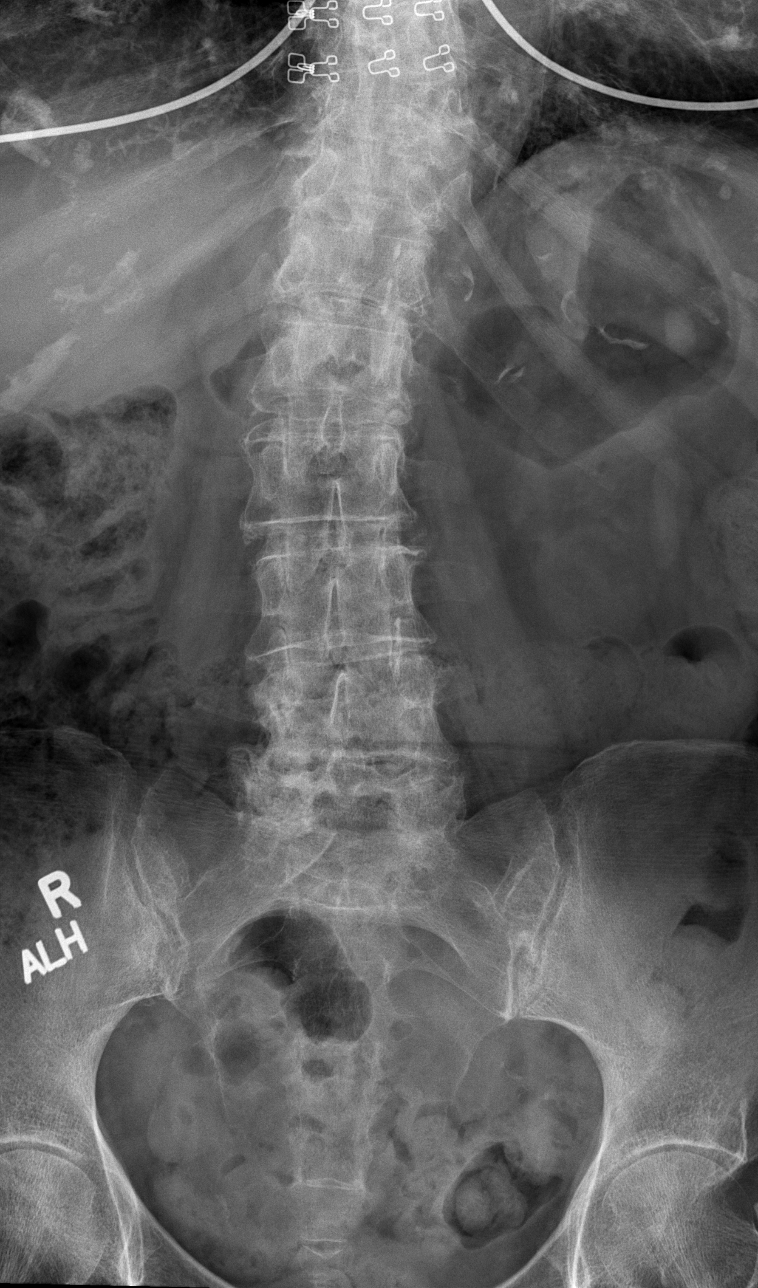

[t lumbar spine obl (1 of 2)]
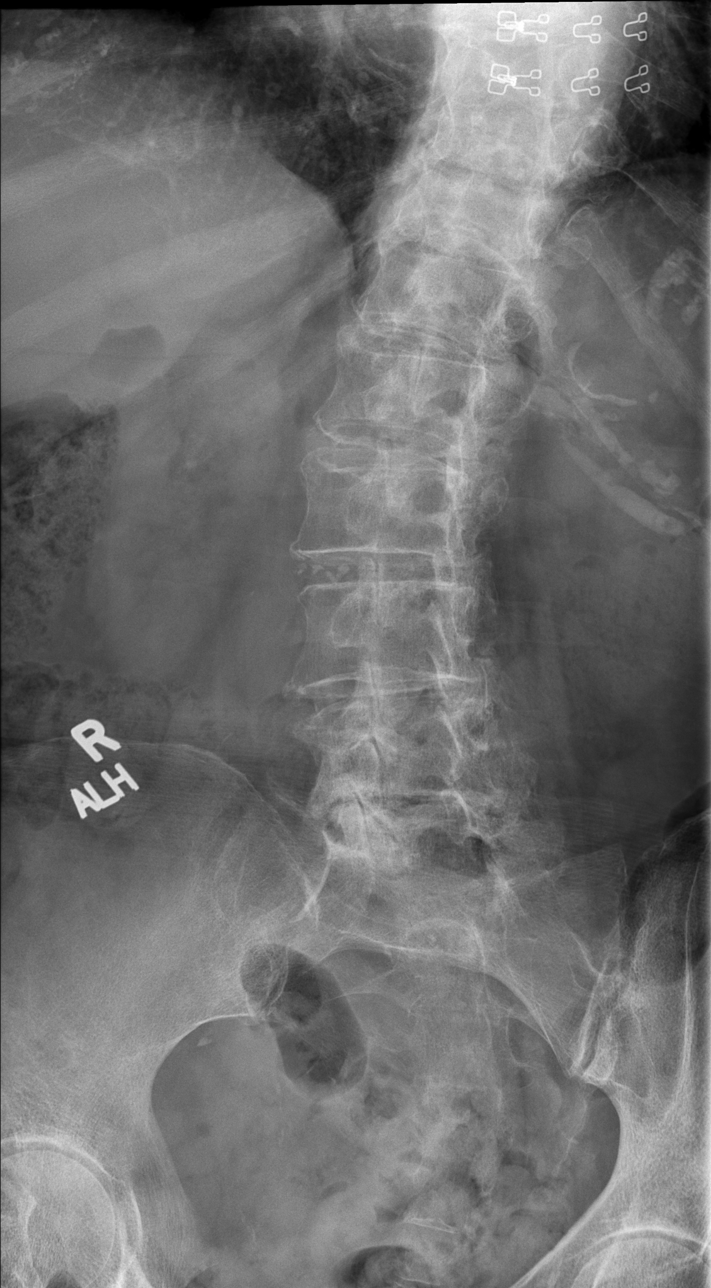

[t lumbar spine obl (2 of 2)]
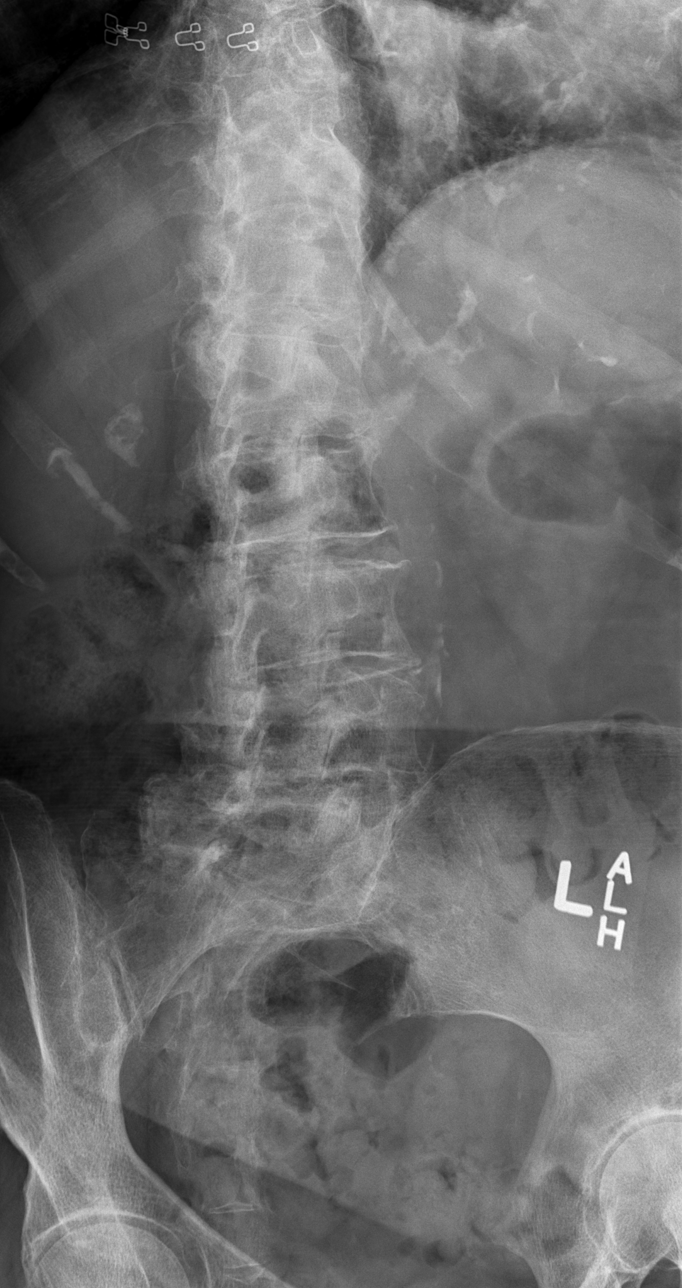

[t lumbar spine lat]
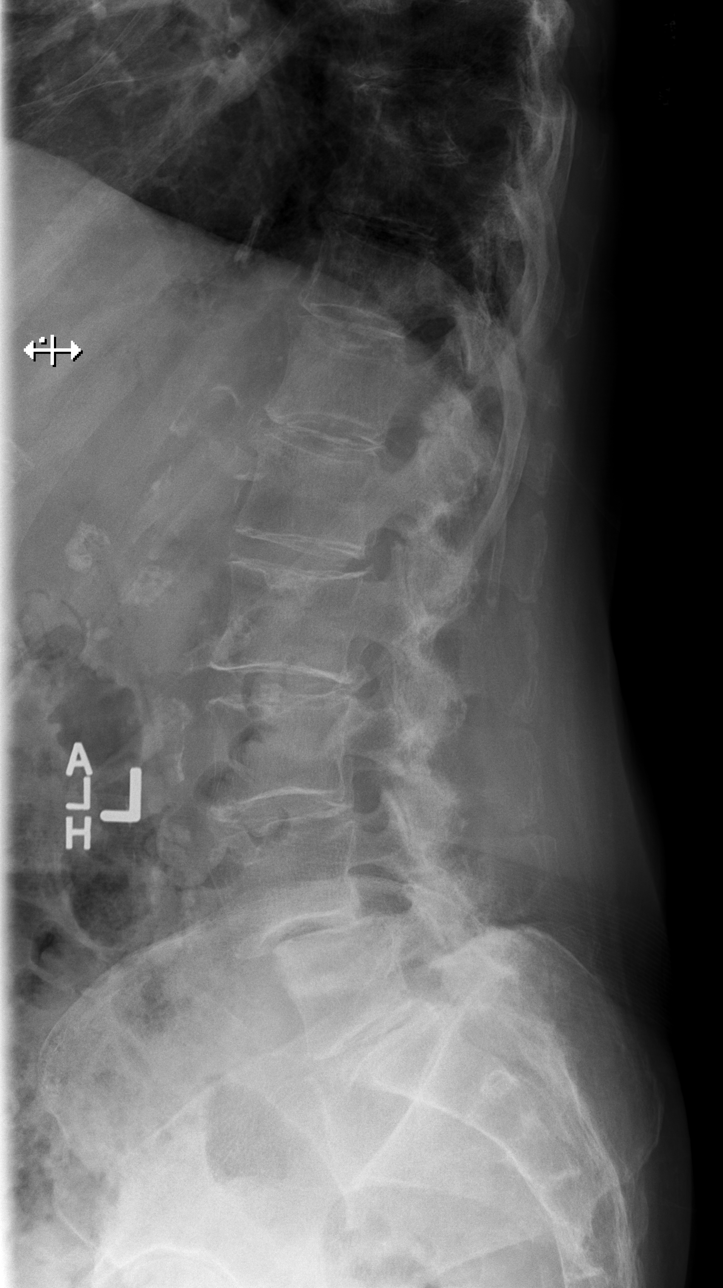

[t lumbar l-5 s-1 spot]
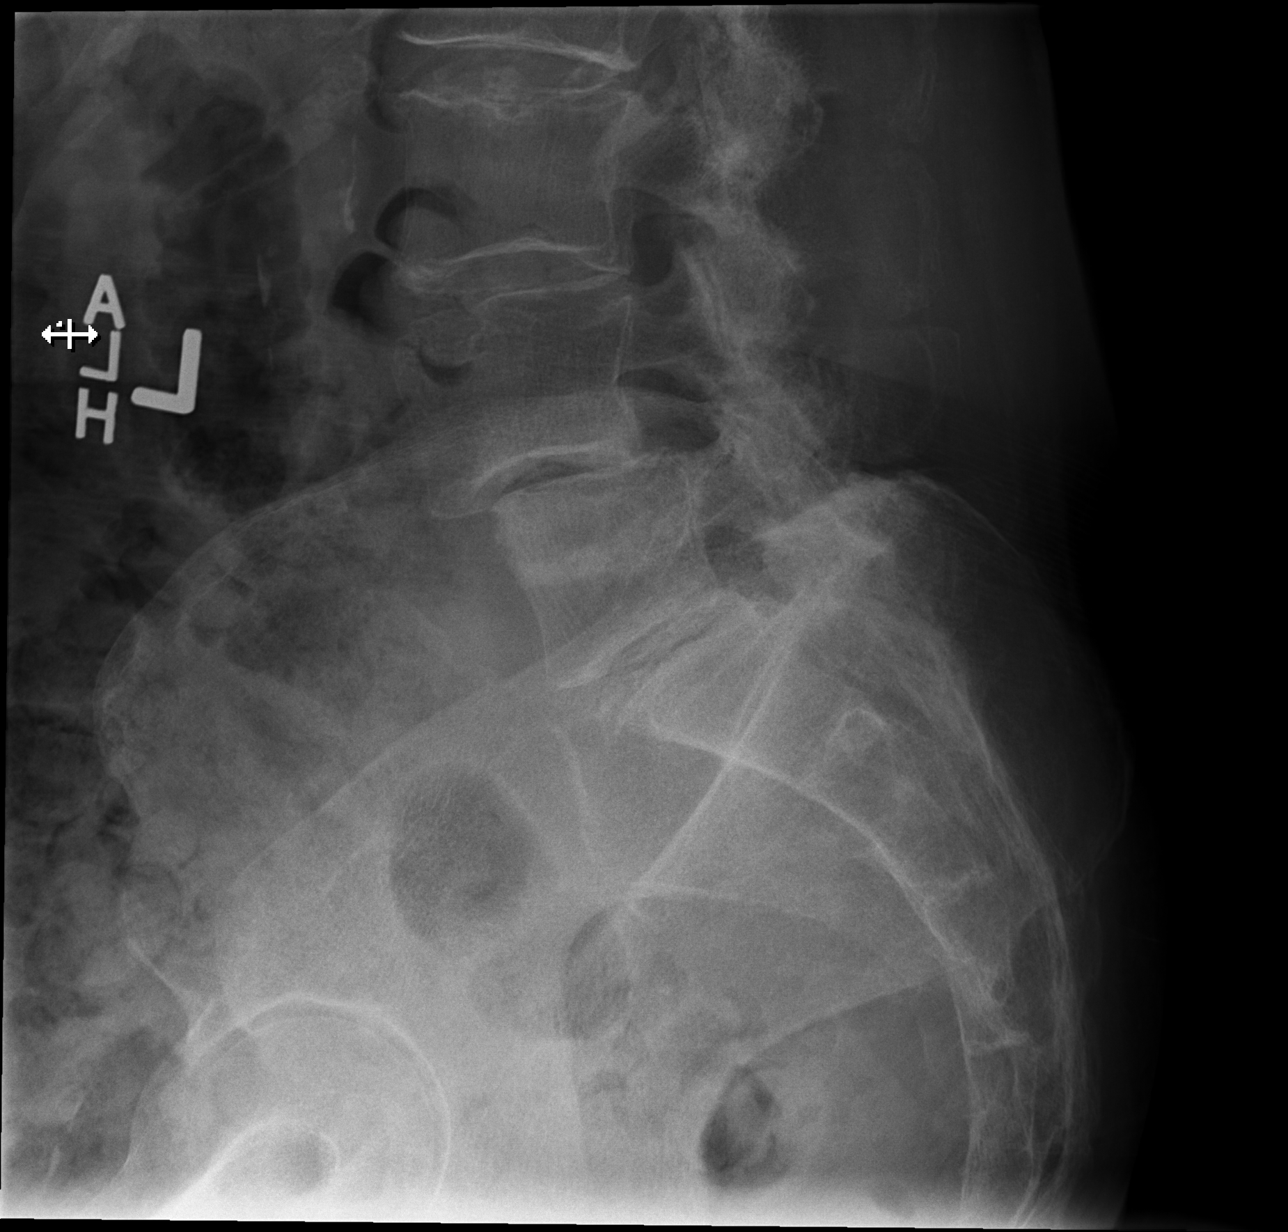

[5 of 5 positions shown; findings below may reference images not displayed]

FINDINGS: Five lumbar type vertebral segments. Vertebral body heights are
maintained. No fracture identified. 10 mm grade 2 anterolisthesis L4
on L5. Multilevel intervertebral disc space loss with associated
degenerative endplate changes, most pronounced at L4-5 and L5-S1.
Lower lumbar facet arthrosis.
IMPRESSION: 1. No acute fracture or traumatic subluxation.
2. Moderate multilevel lumbar spondylosis.
3. Grade 2 anterolisthesis L4 on L5.

## 2020-04-28 ENCOUNTER — Other Ambulatory Visit: Payer: Self-pay | Admitting: Nurse Practitioner

## 2020-04-28 DIAGNOSIS — I739 Peripheral vascular disease, unspecified: Secondary | ICD-10-CM | POA: Diagnosis not present

## 2020-04-28 DIAGNOSIS — M545 Low back pain, unspecified: Secondary | ICD-10-CM

## 2020-04-28 DIAGNOSIS — M79671 Pain in right foot: Secondary | ICD-10-CM | POA: Diagnosis not present

## 2020-04-28 DIAGNOSIS — G8929 Other chronic pain: Secondary | ICD-10-CM

## 2020-04-28 DIAGNOSIS — L84 Corns and callosities: Secondary | ICD-10-CM | POA: Diagnosis not present

## 2020-04-28 DIAGNOSIS — M79672 Pain in left foot: Secondary | ICD-10-CM | POA: Diagnosis not present

## 2020-04-28 DIAGNOSIS — L603 Nail dystrophy: Secondary | ICD-10-CM | POA: Diagnosis not present

## 2020-05-02 ENCOUNTER — Other Ambulatory Visit: Payer: Self-pay | Admitting: Nurse Practitioner

## 2020-05-02 DIAGNOSIS — E041 Nontoxic single thyroid nodule: Secondary | ICD-10-CM

## 2020-05-02 DIAGNOSIS — E038 Other specified hypothyroidism: Secondary | ICD-10-CM

## 2020-05-14 ENCOUNTER — Other Ambulatory Visit: Payer: Self-pay | Admitting: Cardiology

## 2020-05-14 DIAGNOSIS — I471 Supraventricular tachycardia: Secondary | ICD-10-CM

## 2020-05-14 DIAGNOSIS — I10 Essential (primary) hypertension: Secondary | ICD-10-CM

## 2020-05-17 ENCOUNTER — Other Ambulatory Visit: Payer: Self-pay

## 2020-05-17 ENCOUNTER — Ambulatory Visit: Payer: Medicare PPO | Admitting: Cardiology

## 2020-05-17 ENCOUNTER — Encounter: Payer: Self-pay | Admitting: Cardiology

## 2020-05-17 VITALS — BP 154/68 | HR 75 | Temp 98.0°F | Resp 16 | Ht 63.0 in | Wt 155.4 lb

## 2020-05-17 DIAGNOSIS — R0609 Other forms of dyspnea: Secondary | ICD-10-CM

## 2020-05-17 DIAGNOSIS — I471 Supraventricular tachycardia: Secondary | ICD-10-CM

## 2020-05-17 DIAGNOSIS — R0600 Dyspnea, unspecified: Secondary | ICD-10-CM

## 2020-05-17 DIAGNOSIS — Z87891 Personal history of nicotine dependence: Secondary | ICD-10-CM

## 2020-05-17 DIAGNOSIS — I080 Rheumatic disorders of both mitral and aortic valves: Secondary | ICD-10-CM

## 2020-05-17 DIAGNOSIS — I10 Essential (primary) hypertension: Secondary | ICD-10-CM

## 2020-05-17 MED ORDER — CARVEDILOL 12.5 MG PO TABS: 12.5000 mg | ORAL_TABLET | Freq: Two times a day (BID) | ORAL | 0 refills | Status: DC

## 2020-05-17 NOTE — Progress Notes (Signed)
ID:  Donna [name], DOB 06/06/1927, MRN [id number]  PCP:  [name], NP  Cardiologist:  Rex Kras, DO, [facility] (established care 08/26/2019)  Date: 05/17/20 Last Office Visit: 02/16/2020  Chief Complaint  Patient presents with  . Shortness of Breath  . Edema  . Results  . Follow-up    HPI  Donna [name] is a 87 y.o. female who presents to the office with a chief complaint of " 39-monthfollow-up for palpitations." Patient's past medical history and cardiovascular risk factors include: hypertension, supraventricular tachycardia, moderate aortic stenosis, mild MR, migraine, GI bleeding, postmenopausal female, advanced age.  Patient was referred to the office for evaluation of palpitations.  After an extensive cardiovascular work-up with the patient was noted to have SVT and was seen by Dr. TLovena Lefrom cardiac electrophysiology and underwent an EP study as well as radiofrequency ablation of her slow pathway.  Since then symptomatically the patient has improved.  Patient presents for his 313-monthollow-up for management of aortic stenosis, blood pressure management, and shortness of breath.  Since last visit patient had an echocardiogram to follow her aortic stenosis.  Based on the hemodynamics patient is in severity of aortic stenosis remains stable and clinically she is asymptomatic with regards to symptoms of congestive heart failure or angina and syncope.  At the last office visit we discontinued amlodipine due to lower extremity swelling and restarted losartan.  The medication was uptitrated since then and she is currently on 100 mg p.o. daily.  She brings in her blood pressure log for review.  Her morning systolic blood pressures range between 140-160 mmHg in the evening blood pressures range between 120-140 mmHg.  Patient informs me that she had lower extremity swelling most likely secondary to staph infection in her legs and since the resolution of the infection and swelling has  also improved.  Shortness of breath remains relatively stable with frequent flareups most likely secondary to the allergy season.  She denies orthopnea, paroxysmal nocturnal dyspnea or lower extremity swelling.  FUNCTIONAL STATUS: No structured exercise program or daily routine. 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    ALLERGIES: No Known Allergies  MEDICATION LIST PRIOR TO VISIT: Current Meds  Medication Sig  . Blood Pressure Monitoring KIT Essential hypertension check bp daily  . Calcium Carbonate-Vitamin D 600-400 MG-UNIT per tablet Take 1 tablet by mouth in the morning and at bedtime.  . carvedilol (COREG) 12.5 MG tablet Take 1 tablet (12.5 mg total) by mouth 2 (two) times daily.  . Coenzyme Q10 (COQ10) 400 MG CAPS Take 400 mg by mouth every evening.  . Marland Kitchenevothyroxine (SYNTHROID) 100 MCG tablet TAKE 1 TABLET BY MOUTH EVERY DAY 30 MINUTES BEFORE BREAKFAST ON AN EMPTY STOMACH  . Light Mineral Oil-Mineral Oil (RETAINE MGD) 0.5-0.5 % EMUL Place 1 drop into both eyes in the morning and at bedtime.  . Marland Kitchenosartan (COZAAR) 100 MG tablet TAKE 1 TABLET(100 MG) BY MOUTH DAILY  . Multiple Vitamins-Minerals (PRESERVISION AREDS 2) CAPS Take 1 capsule by mouth 2 (two) times a day.   . Marland KitchenVER THE COUNTER MEDICATION Take 2 capsules by mouth daily. MitoQ  . traMADol (ULTRAM) 50 MG tablet Take 0.5 tablets (25 mg total) by mouth daily as needed for severe pain.  . vitamin B-12 (CYANOCOBALAMIN) 1000 MCG tablet Take 1 tablet (1,000 mcg total) by mouth daily.  . [DISCONTINUED] carvedilol (COREG) 6.25 MG tablet TAKE 1 TABLET BY MOUTH TWICE DAILY HOLD IF  TOP BLOOD PRESSURE NUMBER IS LESS THAN 100 MMHG OR HEART RATE LESS THEN 60 BPM     PAST MEDICAL HISTORY: Past Medical History:  Diagnosis Date  . Aortic stenosis   . Hemorrhage of gastrointestinal tract, unspecified   . Hypertension   . Migraine   . Mitral regurgitation   . OA (osteoarthritis)   . Thyroid cyst     PAST  SURGICAL HISTORY: Past Surgical History:  Procedure Laterality Date  . CATARACT EXTRACTION  1990   rt  . CATARACT EXTRACTION  2005   left  . LEFT HEART CATH AND CORONARY ANGIOGRAPHY N/A 10/06/2019   Procedure: LEFT HEART CATH AND CORONARY ANGIOGRAPHY;  Surgeon: Nigel Mormon, MD;  Location: [facility];  Service: Cardiovascular;  Laterality: N/A;  . REVISION TOTAL KNEE ARTHROPLASTY  2008  . SVT ABLATION N/A 01/18/2020   Procedure: SVT ABLATION;  Surgeon: Evans Lance, MD;  Location: [facility];  Service: Cardiovascular;  Laterality: N/A;  . THUMB ARTHROSCOPY    . TONSILLECTOMY      FAMILY HISTORY: The patient family history includes Arthritis in her sister; Brain cancer in her brother; CVA (age of onset: 17) in her sister; CVA (age of onset: 1) in her father; Heart disease in her sister; Pancreatic cancer in her mother.  SOCIAL HISTORY:  The patient  reports that she quit smoking about 72 years ago. Her smoking use included cigarettes. She quit after 2.00 years of use. She has never used smokeless tobacco. She reports previous alcohol use. She reports that she does not use drugs.  REVIEW OF SYSTEMS: Review of Systems  Constitutional: Negative for chills and fever.  HENT: Negative for hoarse voice and nosebleeds.   Eyes: Negative for discharge, double vision and pain.  Cardiovascular: Positive for dyspnea on exertion (improved). Negative for chest pain, claudication, leg swelling, near-syncope, orthopnea, palpitations, paroxysmal nocturnal dyspnea and syncope.  Respiratory: Negative for hemoptysis and shortness of breath.   Musculoskeletal: Negative for muscle cramps and myalgias.  Gastrointestinal: Negative for abdominal pain, constipation, diarrhea, hematemesis, hematochezia, melena, nausea and vomiting.  Neurological: Negative for dizziness, light-headedness and vertigo.    PHYSICAL EXAM: Vitals with BMI 05/17/2020 05/17/2020 04/22/2020  Height - 5' 3" 5' 3"   Weight - 155 lbs 6 oz 154 lbs  BMI - 49.67 59.16  Systolic 384 665 993  Diastolic 68 74 76  Pulse 75 83 77   CONSTITUTIONAL: Well-developed and well-nourished. No acute distress.  SKIN: Skin is warm and dry. No rash noted. No cyanosis. No pallor. No jaundice HEAD: Normocephalic and atraumatic.  EYES: No scleral icterus MOUTH/THROAT: Moist oral membranes.  NECK: No JVD present. No thyromegaly noted.  Bilateral carotid bruits most likely secondary to delayed carotid upstrokes LYMPHATIC: No visible cervical adenopathy.  CHEST Normal respiratory effort. No intercostal retractions  LUNGS: Clear to auscultation bilaterally.  No stridor. No wheezes. No rales.  CARDIOVASCULAR: Regular, positive S1 delayed S2, 3 out of 6 crescendo decrescendo murmur heard at the second right intercostal space, with a component of holosystolic murmur at the apex. ABDOMINAL: Soft, nontender, nondistended, no apparent ascites.  EXTREMITIES: Trace bilateral peripheral edema.  HEMATOLOGIC: No significant bruising NEUROLOGIC: Oriented to person, place, and time. Nonfocal. Normal muscle tone.  PSYCHIATRIC: Normal mood and affect. Normal behavior. Cooperative  CARDIAC DATABASE: EKG: 05/17/2020: Normal sinus rhythm, 82 bpm, normal axis, poor R wave progression, without underlying injury pattern.  Echocardiogram: 03/24/2020: 1. Left ventricular ejection fraction, by estimation, is 60  to 65%. The left ventricle has normal function. The left ventricle has no regional wall motion abnormalities. There is mild left ventricular hypertrophy. Left ventricular diastolic parameters  were normal. 2. Right ventricular systolic function is normal. The right ventricular size is normal. There is normal pulmonary artery systolic pressure. 3. The mitral valve is degenerative. Mild mitral valve regurgitation. No evidence of mitral stenosis. 4. The aortic valve is calcified. Aortic valve regurgitation is mild. Moderate aortic valve  stenosis. Aortic valve mean gradient measures 25.0 mmHg. Aortic valve Vmax measures 3.12 m/s. 5. The inferior vena cava is normal in size with greater than 50% respiratory variability, suggesting right atrial pressure of 3 mmHg.  Stress Testing: None  Heart Catheterization: 10/06/2019: LM: Normal LAD: Mild luminal irregularities LCx: Normal RCA: Normal  LVEDP 14 mmHg 60 mmHg mid-cavitary gradient on pullback No significant LV-Ao gradient on pullback  Carotid artery duplex 09/09/2019:  Minimal stenosis in the right internal carotid artery (minimal).  Minimal stenosis in the left internal carotid artery (minimal).  Minimal plaque noted in the CCA bilaterally without significant stenosis.  Antegrade right vertebral artery flow. Antegrade left vertebral artery flow.  Follow up studies if clinically indicated.  7 day extended Holter monitor: Predominantly normal sinus rhythm (NSR).  HR 40-210 bpm.  Avg HR 79 bpm. Minimum HR 40 bpm on 08/29/19 at 1:02pm sinus with junctional escape. No atrial fibrillation/NSVT/pause (3 secs or longer). Total ventricular ectopic burden <1%. Total supraventricular ectopic burden <1%. 42 episodes of SVT.  Fastest and longest run was 1hr 17 mins with max HR 210 bpm (avg 155 bpm). Patient triggered events: 6.  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   LABORATORY DATA: CBC Latest Ref Rng & Units 04/22/2020 01/11/2020 11/13/2019  WBC 3.8 - 10.8 Thousand/uL 6.2 6.2 9.1  Hemoglobin 11.7 - 15.5 g/dL 13.6 12.6 11.3(L)  Hematocrit 35.0 - 45.0 % 42.3 37.0 35.1(L)  Platelets 140 - 400 Thousand/uL 195 187 159    CMP Latest Ref Rng & Units 03/22/2020 02/26/2020 01/11/2020  Glucose 65 - 99 mg/dL 99 105(H) 73  BUN 10 - 36 mg/dL _0 Creatinine 0.57 - 1.00 mg/dL 0.77 0.83 0.81  Sodium 134 - 144 mmol/L 137 141 137  Potassium 3.5 - 5.2 mmol/L 5.0 4.5 4.6  Chloride 96 - 106 mmol/L 100 102 99  CO2 20  - 29 mmol/L _1 Calcium 8.7 - 10.3 mg/dL 9.8 10.2 9.8  Total Protein 6.5 - 8.1 g/dL - - -  Total Bilirubin 0.3 - 1.2 mg/dL - - -  Alkaline Phos 38 - 126 U/L - - -  AST 15 - 41 U/L - - -  ALT 0 - 44 U/L - - -    Lipid Panel     Component Value Date/Time   CHOL 112 12/08/2018 1004   CHOL 118 08/04/2014 0806   TRIG 80 12/08/2018 1004   HDL 34 (L) 12/08/2018 1004   HDL 34 (L) 08/04/2014 0806   CHOLHDL 3.3 12/08/2018 1004   VLDL 15 08/22/2016 0910   LDLCALC 62 12/08/2018 1004   LABVLDL 15 08/04/2014 0806    No components found for: NTPROBNP No results for input(s): PROBNP in the last 8760 hours. Recent Labs    06/04/19 0810 11/12/19 1027 04/22/20 1411  TSH 3.69 2.77 3.78    BMP Recent Labs    11/19/19 1041 11/23/19 1533 01/11/20 1007 02/26/20 1451 03/22/20 1115  NA 137   < >  137 141 137  K 5.3*   < > 4.6 4.5 5.0  CL 98   < > 99 102 100  CO2 20   < > _0 GLUCOSE 87   < > 73 105* 99  BUN 20   < > _1 CREATININE 0.95   < > 0.81 0.83 0.77  CALCIUM 10.5*   < > 9.8 10.2 9.8  GFRNONAA 53*  --  64 62  --   GFRAA 61  --  74 72  --    < > = values in this interval not displayed.    HEMOGLOBIN A1C Lab Results  Component Value Date   HGBA1C 5.1 08/22/2016   MPG 100 08/22/2016    IMPRESSION:    ICD-10-CM   1. Dyspnea on exertion  R06.00 EKG 12-Lead    Ambulatory referral to Neurology  2. Mitral regurgitation and aortic stenosis  I08.0   3. SVT (supraventricular tachycardia) ([place])  I47.1 Ambulatory referral to Neurology  4. Essential hypertension, benign  I10 carvedilol (COREG) 12.5 MG tablet    Ambulatory referral to Neurology  5. Former smoker  Z87.891      RECOMMENDATIONS: Donna [name] is a 87 y.o. female whose past medical history and cardiac risk factors include: hypertension, supraventricular tachycardia, aortic stenosis, Mid cavitary gradient, severe MR, migraine, GI bleeding, postmenopausal female, advanced age.  Dyspnea on  exertion:  Multifactorial: Valvular heart disease, allergy season, uncontrolled hypertension  Valvular disease remains stable/improving.  Currently being treated for seasonal allergies aggressively.  Recommended better blood pressure management as discussed below.  Clinically remains euvolemic and not in congestive heart failure.  Continue to monitor  Benign essential hypertension:  Patient's blood pressure remains labile.  Home blood pressure log reviewed.  At the last office visit discontinued amlodipine due to lower extremity swelling.  However, patient states that after the last OV she dx w/ a staph infection in her labs and once the infection was treated LE swelling also resolved. Therefore, if needed may consider Norvasc 2.5 mg p.o. daily at the next office visit.  Supraventricular tachycardia:  Stable  Status post EP study/of the frequency ablation of the slow pathway with Dr. Lovena Le.  Plans to see Dr. Lovena Le in as needed basis  Will uptitrate carvedilol to 12.5 mg p.o. twice daily  EKG today shows normal sinus rhythm without underlying ectopy or dysrhythmias  Aortic stenosis:  No significant LV-AO gradient on pullback via invasive angiography  Repeat echocardiogram notes moderate aortic valve stenosis, see above.  Patient is aware to seek medical attention if she has symptoms of chest pain, syncope, heart failure.  Continue to monitor.  Mitral regurgitation:  Based on the most recent echocardiogram and the severity of mitral regurgitation has improved.  I suspect this is most likely secondary to treating her underlying dysrhythmia and better blood pressure management.  Continue to monitor.  Former smoker: Educated on the importance of continued smoking cessation.  Recommended 23-monthfollow-up.  However, patient would like to follow-up in 3 months to make sure that her symptoms are not well controlled and to address any forthcoming issues.  FINAL MEDICATION  LIST END OF ENCOUNTER: Meds ordered this encounter  Medications  . carvedilol (COREG) 12.5 MG tablet    Sig: Take 1 tablet (12.5 mg total) by mouth 2 (two) times daily.    Dispense:  180 tablet    Refill:  0    Medications Discontinued During This Encounter  Medication Reason  .  carvedilol (COREG) 6.25 MG tablet Dose change     Current Outpatient Medications:  .  Blood Pressure Monitoring KIT, Essential hypertension check bp daily, Disp: 1 kit, Rfl: 0 .  Calcium Carbonate-Vitamin D 600-400 MG-UNIT per tablet, Take 1 tablet by mouth in the morning and at bedtime., Disp: , Rfl:  .  carvedilol (COREG) 12.5 MG tablet, Take 1 tablet (12.5 mg total) by mouth 2 (two) times daily., Disp: 180 tablet, Rfl: 0 .  Coenzyme Q10 (COQ10) 400 MG CAPS, Take 400 mg by mouth every evening., Disp: , Rfl:  .  levothyroxine (SYNTHROID) 100 MCG tablet, TAKE 1 TABLET BY MOUTH EVERY DAY 30 MINUTES BEFORE BREAKFAST ON AN EMPTY STOMACH, Disp: 90 tablet, Rfl: 1 .  Light Mineral Oil-Mineral Oil (RETAINE MGD) 0.5-0.5 % EMUL, Place 1 drop into both eyes in the morning and at bedtime., Disp: , Rfl:  .  losartan (COZAAR) 100 MG tablet, TAKE 1 TABLET(100 MG) BY MOUTH DAILY, Disp: 90 tablet, Rfl: 2 .  Multiple Vitamins-Minerals (PRESERVISION AREDS 2) CAPS, Take 1 capsule by mouth 2 (two) times a day. , Disp: , Rfl:  .  OVER THE COUNTER MEDICATION, Take 2 capsules by mouth daily. MitoQ, Disp: , Rfl:  .  traMADol (ULTRAM) 50 MG tablet, Take 0.5 tablets (25 mg total) by mouth daily as needed for severe pain., Disp: 15 tablet, Rfl: 0 .  vitamin B-12 (CYANOCOBALAMIN) 1000 MCG tablet, Take 1 tablet (1,000 mcg total) by mouth daily., Disp: 30 tablet, Rfl: 5  Orders Placed This Encounter  Procedures  . Ambulatory referral to Neurology  . EKG 12-Lead   There are no Patient Instructions on file for this visit.   --Continue cardiac medications as reconciled in final medication list. --Return in about 6 months (around 11/17/2020)  for Follow up PSVT, BP, after PSG. Or sooner if needed. --Continue follow-up with your primary care physician regarding the management of your other chronic comorbid conditions.  Patient's questions and concerns were addressed to her satisfaction. She voices understanding of the instructions provided during this encounter.   This note was created using a voice recognition software as a result there may be grammatical errors inadvertently enclosed that do not reflect the nature of this encounter. Every attempt is made to correct such errors.  Rex Kras, [place], [facility]  Pager: [phone] Office: [phone]

## 2020-06-14 ENCOUNTER — Other Ambulatory Visit: Payer: Medicare PPO

## 2020-06-16 ENCOUNTER — Other Ambulatory Visit: Payer: Medicare PPO

## 2020-06-17 ENCOUNTER — Ambulatory Visit: Payer: Medicare PPO | Admitting: Nurse Practitioner

## 2020-06-17 ENCOUNTER — Other Ambulatory Visit: Payer: Self-pay | Admitting: Cardiology

## 2020-06-20 ENCOUNTER — Ambulatory Visit: Payer: Medicare PPO | Admitting: Internal Medicine

## 2020-07-25 ENCOUNTER — Encounter: Payer: Self-pay | Admitting: Neurology

## 2020-07-25 ENCOUNTER — Ambulatory Visit: Payer: Medicare PPO | Admitting: Neurology

## 2020-07-25 VITALS — BP 138/54 | HR 61 | Ht 63.0 in | Wt 153.0 lb

## 2020-07-25 DIAGNOSIS — M159 Polyosteoarthritis, unspecified: Secondary | ICD-10-CM

## 2020-07-25 DIAGNOSIS — R001 Bradycardia, unspecified: Secondary | ICD-10-CM

## 2020-07-25 DIAGNOSIS — G4719 Other hypersomnia: Secondary | ICD-10-CM | POA: Insufficient documentation

## 2020-07-25 DIAGNOSIS — I471 Supraventricular tachycardia, unspecified: Secondary | ICD-10-CM

## 2020-07-25 DIAGNOSIS — M792 Neuralgia and neuritis, unspecified: Secondary | ICD-10-CM

## 2020-07-25 DIAGNOSIS — I10 Essential (primary) hypertension: Secondary | ICD-10-CM

## 2020-07-25 DIAGNOSIS — I351 Nonrheumatic aortic (valve) insufficiency: Secondary | ICD-10-CM

## 2020-07-25 DIAGNOSIS — R002 Palpitations: Secondary | ICD-10-CM

## 2020-07-25 DIAGNOSIS — G2581 Restless legs syndrome: Secondary | ICD-10-CM | POA: Diagnosis not present

## 2020-07-25 MED ORDER — GABAPENTIN 100 MG PO CAPS: ORAL_CAPSULE | ORAL | 5 refills | Status: DC

## 2020-07-25 NOTE — Progress Notes (Signed)
SLEEP MEDICINE CLINIC    Provider:  Larey Seat, MD  Primary Care Physician:  [name], NP [place] [address]     Referring Provider: Terri Skains, DO          Chief Complaint according to patient   Patient presents with:     New Patient (Initial Visit)           HISTORY OF PRESENT ILLNESS:  Donna [name] is a 87 -year old Caucasian female patient who is seen here on 07-25-2020 upon a referral by her cardiologist .  07/25/2020 .  Chief concern according to patient :  I have high BP in the morning and irregular heart beats, get short of breath- my heart doctor wants this evaluated. My health problems started with my husbands death in 2019/07/17 with dementia , worsening over a course of 12 years.    I have the pleasure of seeing Aurielle Slingerland today, a right -handed Caucasian female with a possible sleep disorder.  She  has a past medical history of Aortic stenosis, Hemorrhage of gastrointestinal tract, unspecified, Hypertension, Hearing loss, Insomnia, Migraine, Mitral regurgitation, OA (osteoarthritis), spinal stenosis , DDD, Vit B 12 deficiency, Anemia, SVT, and Thyroid cyst.   Sleep relevant medical history: Nocturia/ Enuresis were denied, Tonsillectomy in childhood- she broke her nose  , history of cervical spine disorder / deviated septum. She reports not sleeping well at night due to leg restlessness, and is sleepy in daytime, excessive daytime sleepiness.     Family medical /sleep history: No other family member on CPAP with OSA, insomnia, sleep walkers.    Social history:  Patient is retired from Emergency planning/management officer in lab medicine, [employer]-  and lives in a household alone. Family status is widowed , with adult children, and  grandchildren. 2 cats are present. Tobacco use: I smoked in College for about  2 packs total- .  ETOH use - 1 glass of wine once or twice a year-, Caffeine intake in form of Coffee( /) Soda( /) Tea ( sometimes ) or energy  drinks. Regular exercise in form of  walking - but I don't have the energy and  I am afraid of falling. .   Hobbies : reading - but I have macular degeneration- reading large print       Sleep habits are as follows: The patient's dinner time is between 6 PM. The patient goes to bed at 10.30 PM and has RLS- continues to sleep for intervals of 1-2 hours, wakes for 1 bathroom break, and RLS makes return to sleep difficult. the first time at 1 AM.   The preferred sleep position is sideways or supine, with the support of an adjustable bed, but raising it makes the neck pain worse. -1 pillow.  Dreams are reportedly frequently.  7 AM is the usual rise time. The patient wakes up spontaneously at 5 AM- She reports not feeling refreshed or restored in AM, with symptoms such as dry mouth, no morning headaches, and residual fatigue.  Naps are taken frequently when she spends time in her recliner- especially in front of the Tv-  lasting from 30 to 60 minutes and are not more refreshing than nocturnal sleep.    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    How likely are you to doze in the following situations: 0 = not likely, 1 = slight chance, 2 = moderate chance, 3 = high chance   Sitting and Reading? Watching Television? Sitting inactive in a public place (theater or meeting)? As a passenger in a car for an hour without a break? Lying down in the afternoon when circumstances permit? Sitting and talking to someone? Sitting quietly after lunch without alcohol? In a car, while stopped for a few minutes in traffic?   Total = 11 / 24 points   FSS endorsed at 48/ 63 points. Highly fatigued.   Social History   Socioeconomic History   Marital status: Widowed     Spouse name: Not on file   Number of children: 2   Years of education: 51   Highest education level: Not on file  Occupational History   Not on file  Tobacco Use   Smoking status: Former    Years: 2.00    Pack years: 0.00    Types: Cigarettes    Quit date: 01/16/1948    Years since quitting: 72.5   Smokeless tobacco: Never   Tobacco comments:    smoked on occ when she was a teen  Scientific laboratory technician Use: Never used  Substance and Sexual Activity   Alcohol use: Not Currently    Comment: rarely has a glass of wine   Drug use: No   Sexual activity: Not on file  Other Topics Concern   Not on file  Social History Narrative   Right handed   Decaf coffee, tea sometimes   Lives alone   Social Determinants of Health   Financial Resource Strain: Not on file  Food Insecurity: Not on file  Transportation Needs: Not on file  Physical Activity: Not on file  Stress: Not on file  Social Connections: Not on file    Family History  Problem Relation Age of Onset   CVA Father 67   Pancreatic cancer Mother    CVA Sister 17   Brain cancer Brother    Arthritis Sister    Heart disease Sister    Breast cancer Neg Hx     Past Medical History:  Diagnosis Date   Aortic stenosis    Hemorrhage of gastrointestinal tract, unspecified    Hypertension    Migraine    Mitral regurgitation    OA (osteoarthritis)    Thyroid cyst     Past Surgical History:  Procedure Laterality Date   CATARACT EXTRACTION  1990   rt   CATARACT EXTRACTION  2005   left   LEFT HEART CATH AND CORONARY ANGIOGRAPHY N/A 10/06/2019   Procedure: LEFT HEART CATH AND CORONARY ANGIOGRAPHY;  Surgeon: Nigel Mormon, MD;  Location: [facility];  Service: Cardiovascular;  Laterality: N/A;   REVISION TOTAL KNEE ARTHROPLASTY  2008   SVT ABLATION N/A 01/18/2020   Procedure: SVT ABLATION;  Surgeon: Evans Lance, MD;  Location: [facility];  Service: Cardiovascular;  Laterality: N/A;   THUMB  ARTHROSCOPY     TONSILLECTOMY       Current Outpatient Medications on File Prior to Visit  Medication Sig Dispense Refill   Blood Pressure Monitoring KIT Essential hypertension check bp daily 1 kit 0   Calcium Carbonate-Vitamin D 600-400 MG-UNIT per tablet Take 1 tablet by mouth in the morning and at bedtime.     carvedilol (  COREG) 12.5 MG tablet Take 1 tablet (12.5 mg total) by mouth 2 (two) times daily. 180 tablet 0   Coenzyme Q10 (COQ10) 400 MG CAPS Take 400 mg by mouth every evening.     levothyroxine (SYNTHROID) 100 MCG tablet TAKE 1 TABLET BY MOUTH EVERY DAY 30 MINUTES BEFORE BREAKFAST ON AN EMPTY STOMACH 90 tablet 1   Light Mineral Oil-Mineral Oil (RETAINE MGD) 0.5-0.5 % EMUL Place 1 drop into both eyes in the morning and at bedtime.     losartan (COZAAR) 100 MG tablet TAKE 1 TABLET(100 MG) BY MOUTH DAILY 90 tablet 2   Multiple Vitamins-Minerals (PRESERVISION AREDS 2) CAPS Take 1 capsule by mouth 2 (two) times a day.      OVER THE COUNTER MEDICATION Take 2 capsules by mouth daily. MitoQ     vitamin B-12 (CYANOCOBALAMIN) 1000 MCG tablet Take 1 tablet (1,000 mcg total) by mouth daily. 30 tablet 5   No current facility-administered medications on file prior to visit.    No Known Allergies  Physical exam:  Today's Vitals   07/25/20 0931  BP: (!) 138/54  Pulse: 61  SpO2: 100%  Weight: 153 lb (69.4 kg)  Height: 5' 3"  (1.6 m)   Body mass index is 27.1 kg/m.   Wt Readings from Last 3 Encounters:  07/25/20 153 lb (69.4 kg)  05/17/20 155 lb 6.4 oz (70.5 kg)  04/22/20 154 lb (69.9 kg)     Ht Readings from Last 3 Encounters:  07/25/20 5' 3"  (1.6 m)  05/17/20 5' 3"  (1.6 m)  04/22/20 5' 3"  (1.6 m)   The patient gave me insight into her BP log book- she often wakes with HTN, 601U systolic in AM>    General: The patient is awake, alert and appears not in acute distress. The patient is well groomed. Head: Normocephalic, atraumatic. Neck is supple. Mallampati 2,  neck  circumference: inches . Nasal airflow restricted patent.  Retrognathia is  seen.  Dental status: biological teeth.  Cardiovascular:  Regular rate and cardiac rhythm by pulse,  without distended neck veins. Respiratory: Lungs are clear to auscultation.  Skin:  Without evidence of ankle edema, or rash. Trunk: The patient's posture is erect.   Neurologic exam : The patient is awake and alert, oriented to place and time.   Memory subjective described as intact.  Attention span & concentration ability appears normal.  Speech is fluent,  without  dysarthria, dysphonia or aphasia.  Mood and affect are appropriate.   Cranial nerves: no loss of smell or taste reported  Pupils are equal and briskly reactive to light. Funduscopic exam - macular degeneration- dry form. .  Extraocular movements in vertical and horizontal planes were intact and without nystagmus. No Diplopia. Visual fields by finger perimetry are intact, but there is a central blind spot. Hearing was impaired.     Facial sensation intact to fine touch.  Facial motor strength is symmetric and tongue and uvula move midline.  Neck ROM : rotation, tilt and flexion extension were normal for age and shoulder shrug was symmetrical.    Motor exam:  Symmetric bulk, tone and ROM.   Normal tone without cog wheeling, symmetric grip strength .   Sensory:  Fine touch and vibration were tested  and   absent in knee, ankles and feet , present in upper extremities.  Proprioception tested in the upper extremities was normal.   Coordination: Rapid alternating movements in the fingers/hands were of normal speed.  The Finger-to-nose maneuver was  intact without evidence of ataxia, dysmetria or tremor.   Gait and station: Patient could rise unassisted from a seated position, walked with a 4 prong cane  assistive device.  She attributes her gait to a spinal stenosis, DDD.  Stance is of wider base and the patient turned with 5 steps.  Toe and heel walk  were deferred.  Deep tendon reflexes: in the  upper and lower extremities are symmetric and intact. Patella reflexes intact -  Babinski response was deferred.       After spending a total time of 45  minutes face to face and additional time for physical and neurologic examination, review of laboratory studies,  personal review of imaging studies, reports and results of other testing and review of referral information / records as far as provided in visit, I have established the following assessments:  Dyspnea on exertion, SVT, essential hypertension. Not sure if she snores ornot. Ambulates w/ cane.   1) RLS reported, which can be related to her history of " neuropathy" and she clearly has brisk reflexes but loss of vibration and soft touch in both legs-and feet. Buing sensation, and creepy crawly at night.  2) EDS related to fragmented sleep . 2) non rheumatic aortic stenosis. B 12 deficiency and SVT-  3) High AM BP.    My Plan is to proceed with:  1) Attended sleep study is preferred , given the overlap of cardiac findings, RLS which can translate into PLMs.  2) if no attended sleep study is permitted, will make do with HST.   3) her TSGH and Vit B 12 have normal levels now, she is not anemic. I like to scree for Ferritin, TIBC, and start low dose gapapentin for her . At night only 100 mg po.   I would like to thank [name], NP  [place],  [place] [address] for allowing me to meet with and to take care of this pleasant patient.   In short, Korine Winton is presenting with EDS and Fatigue , RLS and DDD,.  I plan to follow up either personally or through our NP within 2-4 month.   CC: I will share my notes with Dr Terri Skains.   Electronically signed by: Larey Seat, MD 07/25/2020 9:49 AM  [facility] Board certified by The [employer] of Sleep Medicine and Diplomate of the [employer] of Sleep Medicine. Board  certified In Neurology through the [employer], Fellow of the [employer] of Neurology. Medical Director of [facility].

## 2020-07-25 NOTE — Patient Instructions (Signed)
Gabapentin Capsules or Tablets What is this medication? GABAPENTIN (GA ba pen tin) treats nerve pain. It may also be used to prevent and control seizures in people with epilepsy. It works by calming overactivenerves in your body. This medicine may be used for other purposes; ask your health care provider orpharmacist if you have questions. COMMON BRAND NAME(S): Active-PAC with Gabapentin, Gabarone, Gralise, Neurontin What should I tell my care team before I take this medication? They need to know if you have any of these conditions: Alcohol or substance use disorder Kidney disease Lung or breathing disease Suicidal thoughts, plans, or attempt; a previous suicide attempt by you or a family member An unusual or allergic reaction to gabapentin, other medications, foods, dyes, or preservatives Pregnant or trying to get pregnant Breast-feeding How should I use this medication? Take this medication by mouth with a glass of water. Follow the directions on the prescription label. You can take it with or without food. If it upsets your stomach, take it with food. Take your medication at regular intervals. Do not take it more often than directed. Do not stop taking except on your care team'sadvice. If you are directed to break the 600 or 800 mg tablets in half as part of your dose, the extra half tablet should be used for the next dose. If you have notused the extra half tablet within 28 days, it should be thrown away. A special MedGuide will be given to you by the pharmacist with eachprescription and refill. Be sure to read this information carefully each time. Talk to your care team about the use of this medication in children. While this medication may be prescribed for children as young as 3 years for selectedconditions, precautions do apply. Overdosage: If you think you have taken too much of this medicine contact apoison control center or emergency room at once. NOTE: This medicine is only for you.  Do not share this medicine with others. What if I miss a dose? If you miss a dose, take it as soon as you can. If it is almost time for yournext dose, take only that dose. Do not take double or extra doses. What may interact with this medication? Alcohol Antihistamines for allergy, cough, and cold Certain medications for anxiety or sleep Certain medications for depression like amitriptyline, fluoxetine, sertraline Certain medications for seizures like phenobarbital, primidone Certain medications for stomach problems General anesthetics like halothane, isoflurane, methoxyflurane, propofol Local anesthetics like lidocaine, pramoxine, tetracaine Medications that relax muscles for surgery Narcotic medications for pain Phenothiazines like chlorpromazine, mesoridazine, prochlorperazine, thioridazine This list may not describe all possible interactions. Give your health care provider a list of all the medicines, herbs, non-prescription drugs, or dietary supplements you use. Also tell them if you smoke, drink alcohol, or use illegaldrugs. Some items may interact with your medicine. What should I watch for while using this medication? Visit your care team for regular checks on your progress. You may want to keep a record at home of how you feel your condition is responding to treatment. You may want to share this information with your care team at each visit. You should contact your care team if your seizures get worse or if you have any new types of seizures. Do not stop taking this medication or any of your seizure medications unless instructed by your care team. Stopping your medicationsuddenly can increase your seizures or their severity. This medication may cause serious skin reactions. They can happen weeks to months after starting the   medication. Contact your care team right away if you notice fevers or flu-like symptoms with a rash. The rash may be red or purple and then turn into blisters or  peeling of the skin. Or, you might notice a red rash with swelling of the face, lips or lymph nodes in your neck or under yourarms. Wear a medical identification bracelet or chain if you are taking thismedication for seizures. Carry a card that lists all your medications. You may get drowsy, dizzy, or have blurred vision. Do not drive, use machinery, or do anything that needs mental alertness until you know how this medication affects you. To reduce dizzy or fainting spells, do not sit or stand up quickly, especially if you are an older patient. Alcohol can increasedrowsiness and dizziness. Your mouth may get dry. Chewing sugarless gum or sucking hard candy, anddrinking plenty of water may help. Watch for new or worsening thoughts of suicide or depression. This includes sudden changes in mood, behaviors, or thoughts. These changes can happen at any time but are more common in the beginning of treatment or after a change in dose. Call your care team right away if you experience these thoughts orworsening depression. If you become pregnant while using this medication, you may enroll in the [place] American Antiepileptic Drug Pregnancy Registry by calling 1-888-233-2334. This registry collects information about the safety of antiepileptic medication useduring pregnancy. What side effects may I notice from receiving this medication? Side effects that you should report to your care team as soon as possible: Allergic reactions or angioedema-skin rash, itching, hives, swelling of the face, eyes, lips, tongue, arms, or legs, trouble swallowing or breathing Rash, fever, and swollen lymph nodes Thoughts of suicide or self harm, worsening mood, feelings of depression Trouble breathing Unusual changes in mood or behavior in children after use such as difficulty concentrating, hostility, or restlessness Side effects that usually do not require medical attention (report to your careteam if they continue or are  bothersome): Dizziness Drowsiness Nausea Swelling of ankles, feet, or hands Vomiting This list may not describe all possible side effects. Call your doctor for medical advice about side effects. You may report side effects to FDA at1-800-FDA-1088. Where should I keep my medication? Keep out of reach of children and pets. Store at room temperature between 15 and 30 degrees C (59 and 86 degrees F).Get rid of any unused medication after the expiration date. This medication may cause accidental overdose and death if taken by other adults, children, or pets. Mix any unused medication with a substance like cat litter or coffee grounds. Then throw the medication away in a sealed containerlike a sealed bag or a coffee can with a lid. NOTE: This sheet is a summary. It may not cover all possible information. If you have questions about this medicine, talk to your doctor, pharmacist, orhealth care provider.  2022 Elsevier/Gold Standard (2019-11-18 12:16:18)  

## 2020-08-16 ENCOUNTER — Other Ambulatory Visit: Payer: Self-pay | Admitting: Cardiology

## 2020-08-16 DIAGNOSIS — I10 Essential (primary) hypertension: Secondary | ICD-10-CM

## 2020-08-18 ENCOUNTER — Other Ambulatory Visit: Payer: Medicare PPO

## 2020-08-22 ENCOUNTER — Other Ambulatory Visit: Payer: Medicare PPO

## 2020-08-24 ENCOUNTER — Other Ambulatory Visit: Payer: Self-pay

## 2020-08-24 ENCOUNTER — Encounter: Payer: Self-pay | Admitting: Nurse Practitioner

## 2020-08-24 ENCOUNTER — Ambulatory Visit: Payer: Medicare PPO | Admitting: Nurse Practitioner

## 2020-08-24 VITALS — BP 142/70 | HR 78 | Temp 97.9°F | Ht 63.0 in | Wt 150.8 lb

## 2020-08-24 DIAGNOSIS — G8929 Other chronic pain: Secondary | ICD-10-CM

## 2020-08-24 DIAGNOSIS — I10 Essential (primary) hypertension: Secondary | ICD-10-CM

## 2020-08-24 DIAGNOSIS — E039 Hypothyroidism, unspecified: Secondary | ICD-10-CM

## 2020-08-24 DIAGNOSIS — M545 Low back pain, unspecified: Secondary | ICD-10-CM

## 2020-08-24 DIAGNOSIS — R2689 Other abnormalities of gait and mobility: Secondary | ICD-10-CM

## 2020-08-24 DIAGNOSIS — G2581 Restless legs syndrome: Secondary | ICD-10-CM

## 2020-08-24 DIAGNOSIS — R059 Cough, unspecified: Secondary | ICD-10-CM

## 2020-08-24 DIAGNOSIS — R609 Edema, unspecified: Secondary | ICD-10-CM | POA: Diagnosis not present

## 2020-08-24 MED ORDER — AMLODIPINE BESYLATE 2.5 MG PO TABS: 2.5000 mg | ORAL_TABLET | Freq: Every day | ORAL | 0 refills | Status: DC

## 2020-08-24 NOTE — Progress Notes (Signed)
Careteam: Patient Care Team: [name], NP as PCP - General (Geriatric Medicine) [name], OD as Consulting Physician (Optometry) Caffie Pinto., MD as Referring Physician (Orthopedic Surgery)  PLACE OF SERVICE:  [facility] Directive information Does Patient Have a Medical Advance Directive?: Yes, Type of Advance Directive: [place];Out of facility DNR (pink MOST or yellow form), Pre-existing out of facility DNR order (yellow form or pink MOST form): Yellow form placed in chart (order not valid for inpatient use), Does patient want to make changes to medical advance directive?: No - Patient declined  No Known Allergies  Chief Complaint  Patient presents with   Medical Management of Chronic Issues    4 month follow-up and discuss need for flu vaccine (not in stock at [facility]). Observation, patient is with a cough, patient states this is chronic and started when she began taking b/p medications.      HPI: Patient is a 87 y.o. female routine follow up.   RLS- followed by neurology, was started on gabapentin. She states it took about 3 weeks but she has not had any RLS in the last week.   Aortic stenosis- monitored by cardiology, symptoms stable  and no signs of CHF, no chest pain.   Htn- always high, wants to be consistently in the 140s.  Usually in the 150s.  Reports she is only using carvediolol if her HR is over 60.  She monitors HR and often does not take.   Chronic cough- has had a cough for years. Not thought to be due to bp medication. No LE edema. Reports shortness of breath has improved.   Chronic back pain- got injection and had immediate relief. She is scheduled for a follow up in September. Hoping she can cancel back pain has been great.  Reports balance issues. Gotten worse. Using cane.  Review of Systems:  Review of Systems  Constitutional:  Negative for chills, fever and weight loss.  HENT:  Negative for tinnitus.    Respiratory:  Positive for cough. Negative for sputum production and shortness of breath.   Cardiovascular:  Negative for chest pain, palpitations and leg swelling.  Gastrointestinal:  Negative for abdominal pain, constipation, diarrhea and heartburn.  Genitourinary:  Negative for dysuria, frequency and urgency.  Musculoskeletal:  Negative for back pain, falls, joint pain and myalgias.  Skin: Negative.   Neurological:  Negative for dizziness and headaches.  Psychiatric/Behavioral:  Negative for depression and memory loss. The patient does not have insomnia.    Past Medical History:  Diagnosis Date   Aortic stenosis    Hemorrhage of gastrointestinal tract, unspecified    Hypertension    Migraine    Mitral regurgitation    OA (osteoarthritis)    Thyroid cyst    Past Surgical History:  Procedure Laterality Date   CATARACT EXTRACTION  1990   rt   CATARACT EXTRACTION  2005   left   LEFT HEART CATH AND CORONARY ANGIOGRAPHY N/A 10/06/2019   Procedure: LEFT HEART CATH AND CORONARY ANGIOGRAPHY;  Surgeon: Nigel Mormon, MD;  Location: [facility];  Service: Cardiovascular;  Laterality: N/A;   REVISION TOTAL KNEE ARTHROPLASTY  2008   SVT ABLATION N/A 01/18/2020   Procedure: SVT ABLATION;  Surgeon: Evans Lance, MD;  Location: [facility];  Service: Cardiovascular;  Laterality: N/A;   THUMB ARTHROSCOPY     TONSILLECTOMY     Social History:   reports that she quit  smoking about 72 years ago. Her smoking use included cigarettes. She has never used smokeless tobacco. She reports previous alcohol use. She reports that she does not use drugs.  Family History  Problem Relation Age of Onset   CVA Father 71   Pancreatic cancer Mother    CVA Sister 68   Brain cancer Brother    Arthritis Sister    Heart disease Sister    Breast cancer Neg Hx     Medications: Patient's Medications  New Prescriptions   No medications on file  Previous Medications   BLOOD PRESSURE  MONITORING KIT    Essential hypertension check bp daily   CALCIUM CARBONATE-VITAMIN D 600-400 MG-UNIT PER TABLET    Take 1 tablet by mouth in the morning and at bedtime.   CARVEDILOL (COREG) 12.5 MG TABLET    TAKE 1 TABLET(12.5 MG) BY MOUTH TWICE DAILY   GABAPENTIN (NEURONTIN) 100 MG CAPSULE    Take 100 mg gabapentin by mouth before bedtime .   LEVOTHYROXINE (SYNTHROID) 100 MCG TABLET    TAKE 1 TABLET BY MOUTH EVERY DAY 30 MINUTES BEFORE BREAKFAST ON AN EMPTY STOMACH   LIGHT MINERAL OIL-MINERAL OIL (RETAINE MGD) 0.5-0.5 % EMUL    Place 1 drop into both eyes in the morning and at bedtime.   LOSARTAN (COZAAR) 100 MG TABLET    TAKE 1 TABLET(100 MG) BY MOUTH DAILY   MULTIPLE VITAMINS-MINERALS (PRESERVISION AREDS 2) CAPS    Take 1 capsule by mouth 2 (two) times a day.    OVER THE COUNTER MEDICATION    Take 2 capsules by mouth daily. MitoQ   VITAMIN B-12 (CYANOCOBALAMIN) 1000 MCG TABLET    Take 1 tablet (1,000 mcg total) by mouth daily.  Modified Medications   No medications on file  Discontinued Medications   COENZYME Q10 (COQ10) 400 MG CAPS    Take 400 mg by mouth every evening.    Physical Exam:  Vitals:   08/24/20 0954  BP: (!) 142/70  Pulse: 78  Temp: 97.9 F (36.6 C)  TempSrc: Temporal  SpO2: 95%  Weight: 150 lb 12.8 oz (68.4 kg)  Height: 5\' 3"  (1.6 m)   Body mass index is 26.71 kg/m. Wt Readings from Last 3 Encounters:  08/24/20 150 lb 12.8 oz (68.4 kg)  07/25/20 153 lb (69.4 kg)  05/17/20 155 lb 6.4 oz (70.5 kg)    Physical Exam Constitutional:      General: She is not in acute distress.    Appearance: She is well-developed. She is not diaphoretic.  HENT:     Head: Normocephalic and atraumatic.     Mouth/Throat:     Pharynx: No oropharyngeal exudate.  Eyes:     Conjunctiva/sclera: Conjunctivae normal.     Pupils: Pupils are equal, round, and reactive to light.  Cardiovascular:     Rate and Rhythm: Normal rate and regular rhythm.     Heart sounds: Normal heart  sounds.  Pulmonary:     Effort: Pulmonary effort is normal.     Breath sounds: Normal breath sounds.  Abdominal:     General: Bowel sounds are normal.     Palpations: Abdomen is soft.  Musculoskeletal:     Cervical back: Normal range of motion and neck supple.     Right lower leg: No edema.     Left lower leg: No edema.  Skin:    General: Skin is warm and dry.  Neurological:     Mental Status: She is alert.  Psychiatric:        Mood and Affect: Mood normal.    Labs reviewed: Basic Metabolic Panel: Recent Labs    11/12/19 1027 11/12/19 2352 11/19/19 1040 11/19/19 1041 01/11/20 1007 02/26/20 1451 03/22/20 1115 04/22/20 1411  NA  --    < >  --    < > 137 141 137  --   K  --    < >  --    < > 4.6 4.5 5.0  --   CL  --    < >  --    < > 99 102 100  --   CO2  --    < >  --    < > $R'22 23 24  'yG$ --   GLUCOSE  --    < >  --    < > 73 105* 99  --   BUN  --    < >  --    < > $R'20 16 13  'cL$ --   CREATININE  --    < >  --    < > 0.81 0.83 0.77  --   CALCIUM  --    < >  --    < > 9.8 10.2 9.8  --   MG  --    < > 2.1  --   --  2.2 2.1  --   TSH 2.77  --   --   --   --   --   --  3.78   < > = values in this interval not displayed.   Liver Function Tests: Recent Labs    11/12/19 2352 11/13/19 0547  AST 27 22  ALT 64* 52*  ALKPHOS 47 41  BILITOT 0.8 1.1  PROT 6.2* 5.6*  ALBUMIN 4.1 3.7   No results for input(s): LIPASE, AMYLASE in the last 8760 hours. No results for input(s): AMMONIA in the last 8760 hours. CBC: Recent Labs    11/13/19 0547 01/11/20 1007 04/22/20 1411  WBC 9.1 6.2 6.2  NEUTROABS 6.3 3.2 3,825  HGB 11.3* 12.6 13.6  HCT 35.1* 37.0 42.3  MCV 93.1 90 86.7  PLT 159 187 195   Lipid Panel: No results for input(s): CHOL, HDL, LDLCALC, TRIG, CHOLHDL, LDLDIRECT in the last 8760 hours. TSH: Recent Labs    11/12/19 1027 04/22/20 1411  TSH 2.77 3.78   A1C: Lab Results  Component Value Date   HGBA1C 5.1 08/22/2016     Assessment/Plan 1. Edema, unspecified  type -resolved.   2. Hypothyroidism, unspecified type TSH at goal on last labs.   3. Essential hypertension, benign Not at goal. She was previously on higher dose norvasc with LE edema. Will add low dose back at this time to see if bp can not be better controlled. She will continue all other medication with dietary modifications.  - amLODipine (NORVASC) 2.5 MG tablet; Take 1 tablet (2.5 mg total) by mouth daily.  Dispense: 90 tablet; Refill: 0  4. Cough -ongoing, ? Cause of GERD. She does not wish to start medication at this time.   5. RLS (restless legs syndrome) Stable at this time. Has done well with neuropathy.   6. Imbalance Using a cane now. Decrease strength and staminia.  - Ambulatory referral to Physical Therapy  7. Chronic bilateral low back pain without sciatica -improve after injection.  - Ambulatory referral to Physical Therapy  Next appt: 6 months.  Carlos American. Harle Battiest  [facility] [phone]

## 2020-09-22 ENCOUNTER — Ambulatory Visit: Payer: Medicare PPO | Attending: Nurse Practitioner | Admitting: Physical Therapy

## 2020-09-22 ENCOUNTER — Encounter: Payer: Self-pay | Admitting: Physical Therapy

## 2020-09-22 ENCOUNTER — Other Ambulatory Visit: Payer: Self-pay

## 2020-09-22 DIAGNOSIS — R2681 Unsteadiness on feet: Secondary | ICD-10-CM | POA: Diagnosis not present

## 2020-09-22 NOTE — Therapy (Signed)
[facility] [address] [place], [place], [address] Phone: [phone]   Fax:  [phone]  Physical Therapy Evaluation  Patient Details  Name: Donna [name] MRN: [id number] Date of Birth: [birth year] Referring Provider (PT): Abbey ChattersJessica Eubanks, NP   Encounter Date: 09/22/2020   PT End of Session - 09/22/20 1109     Visit Number 1    Number of Visits 12    Date for PT Re-Evaluation 11/03/20    Authorization Type [employer]    Authorization Time Period 09/22/20 to 11/03/20    Progress Note Due on Visit 10    PT Start Time 0935    PT Stop Time 1015    PT Time Calculation (min) 40 min    Activity Tolerance No increased pain;Patient tolerated treatment well    Behavior During Therapy WFL for tasks assessed/performed             Past Medical History:  Diagnosis Date   Aortic stenosis    Hemorrhage of gastrointestinal tract, unspecified    Hypertension    Migraine    Mitral regurgitation    OA (osteoarthritis)    Thyroid cyst     Past Surgical History:  Procedure Laterality Date   CATARACT EXTRACTION  1990   rt   CATARACT EXTRACTION  2005   left   LEFT HEART CATH AND CORONARY ANGIOGRAPHY N/A 10/06/2019   Procedure: LEFT HEART CATH AND CORONARY ANGIOGRAPHY;  Surgeon: Elder NegusPatwardhan, Manish J, MD;  Location: [facility];  Service: Cardiovascular;  Laterality: N/A;   REVISION TOTAL KNEE ARTHROPLASTY  2008   SVT ABLATION N/A 01/18/2020   Procedure: SVT ABLATION;  Surgeon: Marinus Mawaylor, Gregg W, MD;  Location: [facility];  Service: Cardiovascular;  Laterality: N/A;   THUMB ARTHROSCOPY     TONSILLECTOMY      There were no vitals filed for this visit.    Subjective Assessment - 09/22/20 0939     Subjective Pt states that she has had back pain on and off for years, but during the last year it became more debilitating and was causing her trouble walking. She got an injection about 3 months ago which worked until  just this week, her back has started bothering her a little again. She is scheduled for another back injection next week. She feels that her biggest concern is her unsteadiness with daily activity/walking. She does not get dizzy. She is using a cane recently for more stability. She denies any falls.    Pertinent History HTN, migraines, Rt TKA 2008    Limitations Walking;House hold activities    How long can you stand comfortably? sometimes unable to complete fixing a meal without needing intermittent breaks    Patient Stated Goals improve balance    Currently in Pain? No/denies    Pain Location Back    Pain Orientation Lower   sometimes worse on the Rt side   Pain Descriptors / Indicators Aching;Dull    Pain Type Chronic pain    Pain Radiating Towards none down the leg    Pain Onset More than a month ago    Pain Frequency Intermittent    Aggravating Factors  standing, housework, sometimes when laying on her back    Pain Relieving Factors sitting                [facility] PT Assessment - 09/22/20 0001       Assessment   Medical Diagnosis imbalance, chronic low  back pain    Referring Provider (PT) Abbey Chatters, NP    Onset Date/Surgical Date --   balance for the last 2-3 years   Next MD Visit next week for back injection    Prior Therapy none      Precautions   Precautions None      Restrictions   Weight Bearing Restrictions No      Balance Screen   Has the patient fallen in the past 6 months No    Has the patient had a decrease in activity level because of a fear of falling?  No    Is the patient reluctant to leave their home because of a fear of falling?  No      Home Environment   Additional Comments lives alone at home      Prior Function   Level of Independence Independent      Cognition   Overall Cognitive Status Within Functional Limits for tasks assessed      Observation/Other Assessments   Observations pt using SPC recently because she has been feeling  unsteady      Sensation   Additional Comments denies numbness/tingling      ROM / Strength   AROM / PROM / Strength AROM;Strength      AROM   Overall AROM Comments active lumbar rotation Rt/Lt unsteady, limited 75% but no pain reported      Strength   Strength Assessment Site Hip;Knee    Right/Left Hip Right;Left    Right Hip Flexion 3/5    Right Hip ABduction 3/5    Left Hip Flexion 3/5    Left Hip ABduction 3/5    Right/Left Knee Right;Left    Right Knee Flexion 3+/5    Right Knee Extension 3+/5    Left Knee Flexion 3+/5    Left Knee Extension 3+/5      Transfers   Five time sit to stand comments  16 sec    Comments 30 sec sit to stand: x11 reps      Ambulation/Gait   Gait Comments pt ambulating with SPC, unsteady gait, short step length, x2 near LOB when nevigating around obstacles                        Objective measurements completed on examination: See above findings.       [facility] Adult PT Treatment/Exercise - 09/22/20 0001       Standardized Balance Assessment   Standardized Balance Assessment Berg Balance Test;Timed Up and Go Test      Berg Balance Test   Sit to Stand Able to stand without using hands and stabilize independently    Standing Unsupported Able to stand safely 2 minutes    Sitting with Back Unsupported but Feet Supported on Floor or Stool Able to sit safely and securely 2 minutes    Stand to Sit Sits safely with minimal use of hands    Transfers Able to transfer safely, minor use of hands    Standing Unsupported with Eyes Closed Able to stand 10 seconds with supervision    Standing Ubsupported with Feet Together Able to place feet together independently and stand for 1 minute with supervision   more swaying noted last 20 sec   From Standing, Reach Forward with Outstretched Arm Can reach forward >12 cm safely (5")    From Standing Position, Pick up Object from Floor Able to pick up shoe, needs supervision   slowly  From Standing  Position, Turn to Look Behind Over each Shoulder Needs supervision when turning    Turn 360 Degrees Able to turn 360 degrees safely but slowly    Standing Unsupported, Alternately Place Feet on Step/Stool Able to stand independently and complete 8 steps >20 seconds    Standing Unsupported, One Foot in Front Able to take small step independently and hold 30 seconds    Standing on One Leg Able to lift leg independently and hold equal to or more than 3 seconds    Total Score 42                     PT Education - 09/22/20 1109     Education Details eval findings/POC    Person(s) Educated Patient    Methods Explanation    Comprehension Verbalized understanding              PT Short Term Goals - 09/22/20 1127       PT SHORT TERM GOAL #1   Title Pt will be independent with her initial HEP to improve strength and balance.    Time 3    Period Weeks    Status New               PT Long Term Goals - 09/22/20 1127       PT LONG TERM GOAL #1   Title Pt will have greater than 4/5 MMT of the LE which will improve her power and mobility throughout the day.    Time 6    Period Weeks    Status New      PT LONG TERM GOAL #2   Title Pt will be able to complete 5x sit to stand in less 14 sec without the need for UE support on her thighs, to reflect improvements in LE strength and functional stability.    Time 6    Period Weeks    Status New      PT LONG TERM GOAL #3   Title Pt will have atleast 6 point increase on her BERG score which will reflect a clinically significant improvement in her balance and decrease her risk of falls.    Time 6    Period Weeks    Status New      PT LONG TERM GOAL #4   Title Pt will be able to ambulate throughout the clinic without an AD and without the need for PT supervision during an entire session.    Time 6    Period Weeks    Status New      PT LONG TERM GOAL #5   Title Pt will complete TUG with LRAD in less than 14 sec to  reflect improvements in her mobility and safety with short walking distances.    Time 6    Period Weeks    Status New                    Plan - 09/22/20 1112     Clinical Impression Statement Pt is a pleasant 87 y.o F referred to OPPT with complaints of chronic back pain and worsening balance. Pt has had good back pain relief with injections and is due for another one next week. She denies pain today while seated but noted some soreness during some of the balance assessment of today's evaluation. Pt's primary concern is her balance and fear of falling. She perfomed less than ideal on her functional test 5x  sit to stand and TUG. Pt performed within normal limits on 30 sec sit to stand, but she did require use of UE on her thighs for steadiness. Pt scored 42/56 on the BERG balance test, placing her in at a significant risk of falling. Pt demonstrated difficulty navigating around obstacles throughout her session today, requiring close PT supervision. Pt has weakness of the LEs with 3/5 strength in the hip abductors and hip flexors. Pt lives alone and would greatly benefit from skilled PT to address her muscle weakness, and promote improvements in her balance, coordination and stability with gait to improve her safety with home.    Personal Factors and Comorbidities Age;Time since onset of injury/illness/exacerbation;Fitness    Examination-Activity Limitations Locomotion Level;Stand    Examination-Participation Restrictions Cleaning;Community Activity    Stability/Clinical Decision Making Stable/Uncomplicated    Clinical Decision Making Low    Rehab Potential Good    PT Frequency 2x / week    PT Duration 6 weeks    PT Treatment/Interventions ADLs/Self Care Home Management;Therapeutic activities;Therapeutic exercise;Neuromuscular re-education;Manual techniques;Patient/family education;Gait training    PT Next Visit Plan provide HEP for LE strength; do DGI for additional balance goal; balance  activity    PT Home Exercise Plan next visit    Consulted and Agree with Plan of Care Patient             Patient will benefit from skilled therapeutic intervention in order to improve the following deficits and impairments:  Pain, Decreased coordination, Decreased mobility, Decreased strength, Decreased range of motion, Decreased endurance, Decreased activity tolerance, Decreased balance, Difficulty walking, Impaired flexibility  Visit Diagnosis: Unsteadiness on feet     Problem List Patient Active Problem List   Diagnosis Date Noted   Excessive daytime sleepiness 07/25/2020   RLS (restless legs syndrome) 07/25/2020   Intermittent palpitations 07/25/2020   Nonrheumatic aortic valve insufficiency    Bradycardia 11/13/2019   Mitral regurgitation and aortic stenosis 11/12/2019   Severe mitral regurgitation 11/12/2019   SVT (supraventricular tachycardia) (HCC) 11/12/2019   Exertional dyspnea 10/02/2019   Overweight (BMI 25.0-29.9) 10/14/2017   BMI 27.0-27.9,adult 10/14/2017   Balance problem 10/14/2017   Neuropathic pain 09/03/2016   Bilateral nonexudative age-related macular degeneration 09/03/2016   Generalized osteoarthritis of multiple sites 05/07/2013   Hypothyroidism 05/07/2013   Cyst of thyroid 05/07/2013   Hyperhidrosis 05/07/2013   Essential hypertension, benign 05/07/2013   Cough 01/31/2012   11:35 AM,09/22/20 Donita Brooks PT, DPT [facility]  [phone]   [facility] [address] [place], [place], [address] Phone: [phone]   Fax:  [phone]  Name: Sheneka Schrom MRN: [id number] Date of Birth: [birth year]

## 2020-09-27 ENCOUNTER — Encounter: Payer: Self-pay | Admitting: Nurse Practitioner

## 2020-09-27 ENCOUNTER — Telehealth: Payer: Self-pay

## 2020-09-27 ENCOUNTER — Encounter: Payer: Medicare PPO | Admitting: Family

## 2020-09-27 ENCOUNTER — Other Ambulatory Visit: Payer: Self-pay

## 2020-09-27 ENCOUNTER — Ambulatory Visit (INDEPENDENT_AMBULATORY_CARE_PROVIDER_SITE_OTHER): Payer: Medicare PPO | Admitting: Nurse Practitioner

## 2020-09-27 DIAGNOSIS — Z0000 Encounter for general adult medical examination without abnormal findings: Secondary | ICD-10-CM

## 2020-09-27 NOTE — Patient Instructions (Signed)
Donna [name] , Thank you for taking time to come for your Medicare Wellness Visit. I appreciate your ongoing commitment to your health goals. Please review the following plan we discussed and let me know if I can assist you in the future.   Screening recommendations/referrals: Colonoscopy aged out Mammogram aged out Bone Density you have declined Recommended yearly ophthalmology/optometry visit for glaucoma screening and checkup Recommended yearly dental visit for hygiene and checkup  Vaccinations: Influenza vaccine recommended at this time.  Pneumococcal vaccine up to date Tdap vaccine up to date Shingles vaccine up to date    Advanced directives: on file.   Conditions/risks identified: fall risk, advanced age  Next appointment: 1 year for awv   Preventive Care 42 Years and Older, Female Preventive care refers to lifestyle choices and visits with your health care provider that can promote health and wellness. What does preventive care include? A yearly physical exam. This is also called an annual well check. Dental exams once or twice a year. Routine eye exams. Ask your health care provider how often you should have your eyes checked. Personal lifestyle choices, including: Daily care of your teeth and gums. Regular physical activity. Eating a healthy diet. Avoiding tobacco and drug use. Limiting alcohol use. Practicing safe sex. Taking low-dose aspirin every day. Taking vitamin and mineral supplements as recommended by your health care provider. What happens during an annual well check? The services and screenings done by your health care provider during your annual well check will depend on your age, overall health, lifestyle risk factors, and family history of disease. Counseling  Your health care provider may ask you questions about your: Alcohol use. Tobacco use. Drug use. Emotional well-being. Home and relationship well-being. Sexual activity. Eating habits. History  of falls. Memory and ability to understand (cognition). Work and work Astronomer. Reproductive health. Screening  You may have the following tests or measurements: Height, weight, and BMI. Blood pressure. Lipid and cholesterol levels. These may be checked every 5 years, or more frequently if you are over 1 years old. Skin check. Lung cancer screening. You may have this screening every year starting at age 53 if you have a 30-pack-year history of smoking and currently smoke or have quit within the past 15 years. Fecal occult blood test (FOBT) of the stool. You may have this test every year starting at age 29. Flexible sigmoidoscopy or colonoscopy. You may have a sigmoidoscopy every 5 years or a colonoscopy every 10 years starting at age 10. Hepatitis C blood test. Hepatitis B blood test. Sexually transmitted disease (STD) testing. Diabetes screening. This is done by checking your blood sugar (glucose) after you have not eaten for a while (fasting). You may have this done every 1-3 years. Bone density scan. This is done to screen for osteoporosis. You may have this done starting at age 65. Mammogram. This may be done every 1-2 years. Talk to your health care provider about how often you should have regular mammograms. Talk with your health care provider about your test results, treatment options, and if necessary, the need for more tests. Vaccines  Your health care provider may recommend certain vaccines, such as: Influenza vaccine. This is recommended every year. Tetanus, diphtheria, and acellular pertussis (Tdap, Td) vaccine. You may need a Td booster every 10 years. Zoster vaccine. You may need this after age 44. Pneumococcal 13-valent conjugate (PCV13) vaccine. One dose is recommended after age 70. Pneumococcal polysaccharide (PPSV23) vaccine. One dose is recommended after age 32. Talk  to your health care provider about which screenings and vaccines you need and how often you need  them. This information is not intended to replace advice given to you by your health care provider. Make sure you discuss any questions you have with your health care provider. Document Released: 01/28/2015 Document Revised: 09/21/2015 Document Reviewed: 11/02/2014 Elsevier Interactive Patient Education  2017 [place] Prevention in the Home Falls can cause injuries. They can happen to people of all ages. There are many things you can do to make your home safe and to help prevent falls. What can I do on the outside of my home? Regularly fix the edges of walkways and driveways and fix any cracks. Remove anything that might make you trip as you walk through a door, such as a raised step or threshold. Trim any bushes or trees on the path to your home. Use bright outdoor lighting. Clear any walking paths of anything that might make someone trip, such as rocks or tools. Regularly check to see if handrails are loose or broken. Make sure that both sides of any steps have handrails. Any raised decks and porches should have guardrails on the edges. Have any leaves, snow, or ice cleared regularly. Use sand or salt on walking paths during winter. Clean up any spills in your garage right away. This includes oil or grease spills. What can I do in the bathroom? Use night lights. Install grab bars by the toilet and in the tub and shower. Do not use towel bars as grab bars. Use non-skid mats or decals in the tub or shower. If you need to sit down in the shower, use a plastic, non-slip stool. Keep the floor dry. Clean up any water that spills on the floor as soon as it happens. Remove soap buildup in the tub or shower regularly. Attach bath mats securely with double-sided non-slip rug tape. Do not have throw rugs and other things on the floor that can make you trip. What can I do in the bedroom? Use night lights. Make sure that you have a light by your bed that is easy to reach. Do not use  any sheets or blankets that are too big for your bed. They should not hang down onto the floor. Have a firm chair that has side arms. You can use this for support while you get dressed. Do not have throw rugs and other things on the floor that can make you trip. What can I do in the kitchen? Clean up any spills right away. Avoid walking on wet floors. Keep items that you use a lot in easy-to-reach places. If you need to reach something above you, use a strong step stool that has a grab bar. Keep electrical cords out of the way. Do not use floor polish or wax that makes floors slippery. If you must use wax, use non-skid floor wax. Do not have throw rugs and other things on the floor that can make you trip. What can I do with my stairs? Do not leave any items on the stairs. Make sure that there are handrails on both sides of the stairs and use them. Fix handrails that are broken or loose. Make sure that handrails are as long as the stairways. Check any carpeting to make sure that it is firmly attached to the stairs. Fix any carpet that is loose or worn. Avoid having throw rugs at the top or bottom of the stairs. If you do have throw rugs,  attach them to the floor with carpet tape. Make sure that you have a light switch at the top of the stairs and the bottom of the stairs. If you do not have them, ask someone to add them for you. What else can I do to help prevent falls? Wear shoes that: Do not have high heels. Have rubber bottoms. Are comfortable and fit you well. Are closed at the toe. Do not wear sandals. If you use a stepladder: Make sure that it is fully opened. Do not climb a closed stepladder. Make sure that both sides of the stepladder are locked into place. Ask someone to hold it for you, if possible. Clearly mark and make sure that you can see: Any grab bars or handrails. First and last steps. Where the edge of each step is. Use tools that help you move around (mobility aids)  if they are needed. These include: Canes. Walkers. Scooters. Crutches. Turn on the lights when you go into a dark area. Replace any light bulbs as soon as they burn out. Set up your furniture so you have a clear path. Avoid moving your furniture around. If any of your floors are uneven, fix them. If there are any pets around you, be aware of where they are. Review your medicines with your doctor. Some medicines can make you feel dizzy. This can increase your chance of falling. Ask your doctor what other things that you can do to help prevent falls. This information is not intended to replace advice given to you by your health care provider. Make sure you discuss any questions you have with your health care provider. Document Released: 10/28/2008 Document Revised: 06/09/2015 Document Reviewed: 02/05/2014 Elsevier Interactive Patient Education  2017 [employer].

## 2020-09-27 NOTE — Telephone Encounter (Signed)
[name], Donna [name] are scheduled for a virtual visit with your provider today.    Just as we do with appointments in the office, we must obtain your consent to participate.  Your consent will be active for this visit and any virtual visit you may have with one of our providers in the next 365 days.    If you have a MyChart account, I can also send a copy of this consent to you electronically.  All virtual visits are billed to your insurance company just like a traditional visit in the office.  As this is a virtual visit, video technology does not allow for your provider to perform a traditional examination.  This may limit your provider's ability to fully assess your condition.  If your provider identifies any concerns that need to be evaluated in person or the need to arrange testing such as labs, EKG, etc, we will make arrangements to do so.    Although advances in technology are sophisticated, we cannot ensure that it will always work on either your end or our end.  If the connection with a video visit is poor, we may have to switch to a telephone visit.  With either a video or telephone visit, we are not always able to ensure that we have a secure connection.   I need to obtain your verbal consent now.   Are you willing to proceed with your visit today?   Donna [name] has provided verbal consent on 09/27/2020 for a virtual visit (video or telephone).   Elveria Royals, CMA 09/27/2020  4:02 PM

## 2020-09-27 NOTE — Progress Notes (Signed)
This service is provided via telemedicine  No vital signs collected/recorded due to the encounter was a telemedicine visit.   Location of patient (ex: home, work):  Home  Patient consents to a telephone visit:  Yes, see encounter dated 09/27/2020  Location of the provider (ex: office, home):  [facility]  Name of any referring provider:  N/A  Names of all persons participating in the telemedicine service and their role in the encounter:  Abbey Chatters, Nurse Practitioner, Elveria Royals, CMA, and patient.   Time spent on call:  12 minutes with medical assistant

## 2020-09-27 NOTE — Progress Notes (Signed)
Subjective:   Donna [name] is a 87 y.o. female who presents for Medicare Annual (Subsequent) preventive examination.  Review of Systems     Cardiac Risk Factors include: advanced age (>1mn, >>69women);hypertension     Objective:    Today's Vitals   09/27/20 1604  PainSc: 6    There is no height or weight on file to calculate BMI.  Advanced Directives 09/27/2020 09/22/2020 08/24/2020 04/22/2020 01/18/2020 12/21/2019 11/23/2019  Does Patient Have a Medical Advance Directive? _0  Yes Yes  Type of AParamedicof ACorcoranOut of facility DNR (pink MOST or yellow form) HAdrianLiving will HFort MitchellOut of facility DNR (pink MOST or yellow form) Out of facility DNR (pink MOST or yellow form);Healthcare Power of AMount VictoryLiving will HRosaOut of facility DNR (pink MOST or yellow form) HLincoln UniversityOut of facility DNR (pink MOST or yellow form)  Does patient want to make changes to medical advance directive? No - Patient declined - No - Patient declined No - Patient declined - No - Patient declined No - Patient declined  Copy of HHooverin Chart? Yes - validated most recent copy scanned in chart (See row information) No - copy requested Yes - validated most recent copy scanned in chart (See row information) Yes - validated most recent copy scanned in chart (See row information) - Yes - validated most recent copy scanned in chart (See row information) Yes - validated most recent copy scanned in chart (See row information)  Pre-existing out of facility DNR order (yellow form or pink MOST form) Yellow form placed in chart (order not valid for inpatient use) - Yellow form placed in chart (order not valid for inpatient use) Yellow form placed in chart (order not valid for inpatient use) - Pink MOST/Yellow Form most recent copy in chart - Physician  notified to receive inpatient order Pink MOST/Yellow Form most recent copy in chart - Physician notified to receive inpatient order    Current Medications (verified) Outpatient Encounter Medications as of 09/27/2020  Medication Sig   amLODipine (NORVASC) 2.5 MG tablet Take 1 tablet (2.5 mg total) by mouth daily.   Blood Pressure Monitoring KIT Essential hypertension check bp daily   Calcium Carbonate-Vitamin D 600-400 MG-UNIT per tablet Take 1 tablet by mouth in the morning and at bedtime.   carvedilol (COREG) 12.5 MG tablet TAKE 1 TABLET(12.5 MG) BY MOUTH TWICE DAILY   gabapentin (NEURONTIN) 100 MG capsule Take 100 mg gabapentin by mouth before bedtime .   levothyroxine (SYNTHROID) 100 MCG tablet TAKE 1 TABLET BY MOUTH EVERY DAY 30 MINUTES BEFORE BREAKFAST ON AN EMPTY STOMACH   Light Mineral Oil-Mineral Oil (RETAINE MGD) 0.5-0.5 % EMUL Place 1 drop into both eyes in the morning and at bedtime.   losartan (COZAAR) 100 MG tablet TAKE 1 TABLET(100 MG) BY MOUTH DAILY   Multiple Vitamins-Minerals (PRESERVISION AREDS 2) CAPS Take 1 capsule by mouth 2 (two) times a day.    OVER THE COUNTER MEDICATION Take 2 capsules by mouth daily. MitoQ   vitamin B-12 (CYANOCOBALAMIN) 1000 MCG tablet Take 1 tablet (1,000 mcg total) by mouth daily.   No facility-administered encounter medications on file as of 09/27/2020.    Allergies (verified) Patient has no known allergies.   History: Past Medical History:  Diagnosis Date   Aortic stenosis    Hemorrhage of gastrointestinal tract, unspecified  Hypertension    Migraine    Mitral regurgitation    OA (osteoarthritis)    Thyroid cyst    Past Surgical History:  Procedure Laterality Date   CATARACT EXTRACTION  1990   rt   CATARACT EXTRACTION  2005   left   LEFT HEART CATH AND CORONARY ANGIOGRAPHY N/A 10/06/2019   Procedure: LEFT HEART CATH AND CORONARY ANGIOGRAPHY;  Surgeon: Nigel Mormon, MD;  Location: [facility];  Service:  Cardiovascular;  Laterality: N/A;   REVISION TOTAL KNEE ARTHROPLASTY  2008   SVT ABLATION N/A 01/18/2020   Procedure: SVT ABLATION;  Surgeon: Evans Lance, MD;  Location: [place] [facility];  Service: Cardiovascular;  Laterality: N/A;   THUMB ARTHROSCOPY     TONSILLECTOMY     Family History  Problem Relation Age of Onset   CVA Father 9   Pancreatic cancer Mother    CVA Sister 75   Brain cancer Brother    Arthritis Sister    Heart disease Sister    Breast cancer Neg Hx    Social History   Socioeconomic History   Marital status: Widowed    Spouse name: Not on file   Number of children: 2   Years of education: 26   Highest education level: Not on file  Occupational History   Not on file  Tobacco Use   Smoking status: Former    Years: 2.00    Types: Cigarettes    Quit date: 01/16/1948    Years since quitting: 72.7   Smokeless tobacco: Never   Tobacco comments:    smoked on occ when she was a teen  Scientific laboratory technician Use: Never used  Substance and Sexual Activity   Alcohol use: Not Currently    Comment: rarely has a glass of wine   Drug use: No   Sexual activity: Not on file  Other Topics Concern   Not on file  Social History Narrative   Right handed   Decaf coffee, tea sometimes   Lives alone   Social Determinants of Health   Financial Resource Strain: Not on file  Food Insecurity: Not on file  Transportation Needs: Not on file  Physical Activity: Not on file  Stress: Not on file  Social Connections: Not on file    Tobacco Counseling Counseling given: Not Answered Tobacco comments: smoked on occ when she was a teen   Clinical Intake:  Pre-visit preparation completed: Yes  Pain : 0-10 Pain Score: 6  Pain Type: Chronic pain Pain Location: Back Pain Orientation: Lower Pain Radiating Towards: no Pain Descriptors / Indicators: Aching Pain Onset: More than a month ago Pain Frequency: Constant Pain Relieving Factors: resting. Effect of Pain on  Daily Activities: hard to carry things, walking a lot.  Pain Relieving Factors: resting.  BMI - recorded: 26 Nutritional Status: BMI 25 -29 Overweight Nutritional Risks: None Diabetes: No  How often do you need to have someone help you when you read instructions, pamphlets, or other written materials from your doctor or pharmacy?: 1 - Never  [place] of Daily Living In your present state of health, do you have any difficulty performing the following activities: 09/27/2020  Hearing? N  Vision? Y  Difficulty concentrating or making decisions? Y  Walking or climbing stairs? Y  Dressing or bathing? N  Doing errands, shopping? N  Preparing Food and eating ? N  Using the Toilet? N  In the past six months, have you accidently leaked urine? N  Do you have problems with loss of bowel control? N  Managing your Medications? N  Managing your Finances? N  Housekeeping or managing your Housekeeping? N  Some recent data might be hidden    Patient Care Team: [name], NP as PCP - General (Geriatric Medicine) [name], OD as Consulting Physician (Optometry) Caffie Pinto., MD as Referring Physician (Orthopedic Surgery)  Indicate any recent Medical Services you may have received from other than [facility] providers in the past year (date may be approximate).     Assessment:   This is a routine wellness examination for Elizzie.  Hearing/Vision screen Hearing Screening - Comments:: Patient has no hearing problems Vision Screening - Comments:: Patient has macular degeneration. Patient's last eye exam was in June. Patient sees Dr. Bing Plume (use to see Dr. Posey Pronto)  Dietary issues and exercise activities discussed: Current Exercise Habits: The patient does not participate in regular exercise at present   Goals Addressed   None    Depression Screen PHQ 2/9 Scores 09/27/2020 08/24/2020 04/22/2020 12/21/2019 11/23/2019 11/12/2019 09/22/2019  PHQ - 2 Score 0 0 0 0 0 0  0    Fall Risk Fall Risk  09/27/2020 08/24/2020 04/22/2020 12/21/2019 11/23/2019  Falls in the past year? 0 1 0 0 0  Number falls in past yr: 0 0 0 0 0  Injury with Fall? 0 0 0 0 0  Risk for fall due to : No Fall Risks No Fall Risks - - -  Follow up Falls evaluation completed Falls evaluation completed - - -    FALL RISK PREVENTION PERTAINING TO THE HOME:  Any stairs in or around the home? No  If so, are there any without handrails? No  Home free of loose throw rugs in walkways, pet beds, electrical cords, etc? Yes  Adequate lighting in your home to reduce risk of falls? No   ASSISTIVE DEVICES UTILIZED TO PREVENT FALLS:  Life alert? Yes  Use of a cane, walker or w/c? Yes  Grab bars in the bathroom? Yes  Shower chair or bench in shower? Yes  Elevated toilet seat or a handicapped toilet? Yes   TIMED UP AND GO:  Was the test performed? No .    Cognitive Function: MMSE - Mini Mental State Exam 09/16/2018 09/11/2017 08/22/2016 08/19/2015 08/13/2014  Orientation to time _0 Orientation to Place _1 Registration _2 Attention/ Calculation _3 Recall _4 Language- name 2 objects _5 Language- repeat _6 Language- follow 3 step command _7 Language- read & follow direction _8 Write a sentence _9 Copy design _10 Total score _11 6CIT Screen 09/27/2020 09/22/2019  What Year? 0 points 0 points  What month? 0 points 0 points  What time? 0 points 0 points  Count back from 20 0 points 0 points  Months in reverse 0 points 0 points  Repeat phrase 0 points 4 points  Total Score 0 4    Immunizations Immunization History  Administered Date(s) Administered   Fluad Quad(high Dose 65+) 10/08/2018   Influenza Whole 10/16/2011   Influenza, High Dose Seasonal PF  10/01/2017, 10/07/2019   Influenza,inj,Quad PF,6+ Mos 09/21/2015   Influenza-Unspecified 10/22/2013, 10/01/2014, 09/26/2016   PFIZER(Purple  Top)SARS-COV-2 Vaccination 02/22/2019, 03/19/2019, 01/04/2020, 06/22/2020   Pneumococcal Conjugate-13 08/13/2014   Pneumococcal Polysaccharide-23 08/19/2015   Tdap 08/27/2013   Zoster Recombinat (Shingrix) 05/29/2016, 08/23/2016   Zoster, Live 01/16/2011    TDAP status: Up to date  Flu Vaccine status: Due, Education has been provided regarding the importance of this vaccine. Advised may receive this vaccine at local pharmacy or Health Dept. Aware to provide a copy of the vaccination record if obtained from local pharmacy or Health Dept. Verbalized acceptance and understanding.  Pneumococcal vaccine status: Up to date  Covid-19 vaccine status: Completed vaccines  Qualifies for Shingles Vaccine? Yes   Zostavax completed Yes   Shingrix Completed?: Yes  Screening Tests Health Maintenance  Topic Date Due   DEXA SCAN  Never done   INFLUENZA VACCINE  08/15/2020   TETANUS/TDAP  08/28/2023   COVID-19 Vaccine  Completed   PNA vac Low Risk Adult  Completed   Zoster Vaccines- Shingrix  Completed   HPV VACCINES  Aged Out    Health Maintenance  Health Maintenance Due  Topic Date Due   DEXA SCAN  Never done   INFLUENZA VACCINE  08/15/2020    Colorectal cancer screening: No longer required.   Mammogram status: No longer required due to aged out.  Declines bone density  Lung Cancer Screening: (Low Dose CT Chest recommended if Age 73-80 years, 30 pack-year currently smoking OR have quit w/in 15years.) does not qualify.    Additional Screening:  Hepatitis C Screening: does not qualify  Vision Screening: Recommended annual ophthalmology exams for early detection of glaucoma and other disorders of the eye. Is the patient up to date with their annual eye exam?  Yes  Who is the provider or what is the name of the office in which the patient attends annual eye exams? [facility] If pt is not established with a provider, would they like to be referred to a provider to establish care? No .    Dental Screening: Recommended annual dental exams for proper oral hygiene  Community Resource Referral / Chronic Care Management: CRR required this visit?  No   CCM required this visit?  No      Plan:     I have personally reviewed and noted the following in the patient's chart:   Medical and social history Use of alcohol, tobacco or illicit drugs  Current medications and supplements including opioid prescriptions.  Functional ability and status Nutritional status Physical activity Advanced directives List of other physicians Hospitalizations, surgeries, and ER visits in previous 12 months Vitals Screenings to include cognitive, depression, and falls Referrals and appointments  In addition, I have reviewed and discussed with patient certain preventive protocols, quality metrics, and best practice recommendations. A written personalized care plan for preventive services as well as general preventive health recommendations were provided to patient.     [name], NP   09/27/2020    Virtual Visit via Telephone Note  I connected with pt on 09/27/20 at  3:45 PM EDT by telephone and verified that I am speaking with the correct person using two identifiers.  Location: Patient: home Provider: [facility]    I discussed the limitations, risks, security and privacy concerns of performing an evaluation and management service by telephone and the availability of in person appointments. I also discussed with the patient that there may be a patient responsible charge related to  this service. The patient expressed understanding and agreed to proceed.   I discussed the assessment and treatment plan with the patient. The patient was provided an opportunity to ask questions and all were answered. The patient agreed with the plan and demonstrated an understanding of the instructions.   The patient was advised to call back or seek an in-person evaluation if the symptoms worsen or  if the condition fails to improve as anticipated.  I provided 18 minutes of non-face-to-face time during this encounter.  Carlos [employer]. Harle Battiest Avs printed and mailed

## 2020-09-28 ENCOUNTER — Ambulatory Visit (INDEPENDENT_AMBULATORY_CARE_PROVIDER_SITE_OTHER): Payer: Medicare PPO | Admitting: Neurology

## 2020-09-28 DIAGNOSIS — G4733 Obstructive sleep apnea (adult) (pediatric): Secondary | ICD-10-CM | POA: Diagnosis not present

## 2020-09-28 DIAGNOSIS — G4719 Other hypersomnia: Secondary | ICD-10-CM

## 2020-09-28 DIAGNOSIS — M792 Neuralgia and neuritis, unspecified: Secondary | ICD-10-CM

## 2020-09-28 DIAGNOSIS — G2581 Restless legs syndrome: Secondary | ICD-10-CM

## 2020-09-28 DIAGNOSIS — I10 Essential (primary) hypertension: Secondary | ICD-10-CM

## 2020-10-03 ENCOUNTER — Other Ambulatory Visit: Payer: Self-pay

## 2020-10-03 ENCOUNTER — Ambulatory Visit: Payer: Medicare PPO | Admitting: Physical Therapy

## 2020-10-03 ENCOUNTER — Encounter: Payer: Self-pay | Admitting: Physical Therapy

## 2020-10-03 DIAGNOSIS — R2681 Unsteadiness on feet: Secondary | ICD-10-CM

## 2020-10-03 NOTE — Therapy (Signed)
[facility] [address], [address] [place], [place], [address] Phone: [phone]   Fax:  [phone]  Physical Therapy Treatment  Patient Details  Name: Donna [name] MRN: [id number] Date of Birth: Nov 14, 1927 Referring Provider (PT): Abbey Chatters, NP   Encounter Date: 10/03/2020   PT End of Session - 10/03/20 1236     Visit Number 2    Number of Visits 12    Date for PT Re-Evaluation 11/03/20    Authorization Type [employer]    Authorization Time Period 09/22/20 to 11/03/20    Progress Note Due on Visit 10    PT Start Time 1227    PT Stop Time 1307    PT Time Calculation (min) 40 min    Activity Tolerance No increased pain;Patient tolerated treatment well    Behavior During Therapy WFL for tasks assessed/performed             Past Medical History:  Diagnosis Date   Aortic stenosis    Hemorrhage of gastrointestinal tract, unspecified    Hypertension    Migraine    Mitral regurgitation    OA (osteoarthritis)    Thyroid cyst     Past Surgical History:  Procedure Laterality Date   CATARACT EXTRACTION  1990   rt   CATARACT EXTRACTION  2005   left   LEFT HEART CATH AND CORONARY ANGIOGRAPHY N/A 10/06/2019   Procedure: LEFT HEART CATH AND CORONARY ANGIOGRAPHY;  Surgeon: Elder Negus, MD;  [facility]: [facility];  Service: Cardiovascular;  Laterality: N/A;   REVISION TOTAL KNEE ARTHROPLASTY  2008   SVT ABLATION N/A 01/18/2020   Procedure: SVT ABLATION;  Surgeon: Marinus Maw, MD;  Location: [facility];  Service: Cardiovascular;  Laterality: N/A;   THUMB ARTHROSCOPY     TONSILLECTOMY      There were no vitals filed for this visit.   Subjective Assessment - 10/03/20 1237     Subjective Injection in her back has helped her pain.    Pertinent History HTN, migraines, Rt TKA 2008    Limitations Walking;House hold activities    Patient Stated Goals improve balance    Currently in Pain?  No/denies                               [facility] Adult PT Treatment/Exercise - 10/03/20 0001       Knee/Hip Exercises: Aerobic   Nustep L1 x 5 min with PTA present to monitor      Knee/Hip Exercises: Seated   Sit to Sand 2 sets;5 reps;without UE support                     PT Education - 10/03/20 1253     Education Details HEP    Person(s) Educated Patient    Methods Explanation;Demonstration;Verbal cues;Handout    Comprehension Returned demonstration;Verbalized understanding              PT Short Term Goals - 09/22/20 1127       PT SHORT TERM GOAL #1   Title Pt will be independent with her initial HEP to improve strength and balance.    Time 3    Period Weeks    Status New               PT Long Term Goals - 09/22/20 1127       PT LONG TERM  GOAL #1   Title Pt will have greater than 4/5 MMT of the LE which will improve her power and mobility throughout the day.    Time 6    Period Weeks    Status New      PT LONG TERM GOAL #2   Title Pt will be able to complete 5x sit to stand in less 14 sec without the need for UE support on her thighs, to reflect improvements in LE strength and functional stability.    Time 6    Period Weeks    Status New      PT LONG TERM GOAL #3   Title Pt will have atleast 6 point increase on her BERG score which will reflect a clinically significant improvement in her balance and decrease her risk of falls.    Time 6    Period Weeks    Status New      PT LONG TERM GOAL #4   Title Pt will be able to ambulate throughout the clinic without an AD and without the need for PT supervision during an entire session.    Time 6    Period Weeks    Status New      PT LONG TERM GOAL #5   Title Pt will complete TUG with LRAD in less than 14 sec to reflect improvements in her mobility and safety with short walking distances.    Time 6    Period Weeks    Status New                   Plan -  10/03/20 1236     Clinical Impression Statement Pt arrives to first time follow up post initinal evaluation. pt is pain free. Initial HEP was set up including visuals. Pt also did teh Nustep for 5 min, some Lt knee/lower leg discomfort but not significant. Pt ambulated around the clinic 2 laps wihtou AD and light CGA from PTA. 2-3 episodes of foot not clearing the floor.    Personal Factors and Comorbidities Age;Time since onset of injury/illness/exacerbation;Fitness    Examination-Activity Limitations Locomotion Level;Stand    Examination-Participation Restrictions Cleaning;Community Activity    Stability/Clinical Decision Making Stable/Uncomplicated    Rehab Potential Good    PT Frequency 2x / week    PT Duration 6 weeks    PT Treatment/Interventions ADLs/Self Care Home Management;Therapeutic activities;Therapeutic exercise;Neuromuscular re-education;Manual techniques;Patient/family education;Gait training    PT Next Visit Plan Review HEP given today, do DGI ( no time today), contin with balance    PT Home Exercise Plan Access Code [email or web address]    Consulted and Agree with Plan of Care Patient             Patient will benefit from skilled therapeutic intervention in order to improve the following deficits and impairments:  Pain, Decreased coordination, Decreased mobility, Decreased strength, Decreased range of motion, Decreased endurance, Decreased activity tolerance, Decreased balance, Difficulty walking, Impaired flexibility  Visit Diagnosis: Unsteadiness on feet     Problem List Patient Active Problem List   Diagnosis Date Noted   Excessive daytime sleepiness 07/25/2020   RLS (restless legs syndrome) 07/25/2020   Intermittent palpitations 07/25/2020   Nonrheumatic aortic valve insufficiency    Bradycardia 11/13/2019   Mitral regurgitation and aortic stenosis 11/12/2019   Severe mitral regurgitation 11/12/2019   SVT (supraventricular tachycardia) (HCC) 11/12/2019   Exertional  dyspnea 10/02/2019   Overweight (BMI 25.0-29.9) 10/14/2017   BMI 27.0-27.9,adult 10/14/2017   Balance problem 10/14/2017  Neuropathic pain 09/03/2016   Bilateral nonexudative age-related macular degeneration 09/03/2016   Generalized osteoarthritis of multiple sites 05/07/2013   Hypothyroidism 05/07/2013   Cyst of thyroid 05/07/2013   Hyperhidrosis 05/07/2013   Essential hypertension, benign 05/07/2013   Cough 01/31/2012    Aanshi Batchelder, PTA 10/03/2020, 1:09 PM  [facility] [address] [place], [place], [address] Phone: [phone]   Fax:  [phone]  Name: Donna [name] MRN: [id number] Date of Birth: [birth year]   Access Code: [email or web address]: [email or web address]: 09/19/2022Prepared by: Victorino Dike CochranExercises  Seated Long Arc Quad - 1 x daily - 7 x weekly - 10 reps - 5 hold  Seated March - 1 x daily - 7 x weekly - 10 reps  Standing Heel Raise with Support - 1 x daily - 7 x weekly - 10 reps  Seated Ankle Dorsiflexion AROM - 2 x daily - 7 x weekly - 10 reps  Seated Heel Raise - 2 x daily - 7 x weekly - 1 sets - 10 reps  Standing Hip Abduction with Counter Support - 1 x daily - 7 x weekly - 10 reps

## 2020-10-05 ENCOUNTER — Encounter: Payer: Self-pay | Admitting: Physical Therapy

## 2020-10-05 ENCOUNTER — Other Ambulatory Visit: Payer: Self-pay

## 2020-10-05 ENCOUNTER — Ambulatory Visit: Payer: Medicare PPO | Admitting: Physical Therapy

## 2020-10-05 DIAGNOSIS — R2681 Unsteadiness on feet: Secondary | ICD-10-CM

## 2020-10-05 NOTE — Patient Instructions (Signed)
Access Code: [email or web address] URL: [email or web address] Date: 10/05/2020 Prepared by: Loistine Simas Camron Essman  Exercises Seated Long Arc Quad - 1 x daily - 7 x weekly - 10 reps - 5 hold Seated March - 1 x daily - 7 x weekly - 10 reps Standing Heel Raise with Support - 1 x daily - 7 x weekly - 10 reps Seated Ankle Dorsiflexion AROM - 2 x daily - 7 x weekly - 10 reps Seated Heel Raise - 2 x daily - 7 x weekly - 1 sets - 10 reps Standing Hip Abduction with Counter Support - 1 x daily - 7 x weekly - 10 reps Standing High Row with Anchored Resistance - 1 x daily - 7 x weekly - 1 sets - 15 reps Standing Single Leg Stance with Counter Support - 1 x daily - 7 x weekly - 1 sets - 2 reps - 10 hold

## 2020-10-05 NOTE — Therapy (Signed)
[facility] [address] [place], [place], [address] Phone: [phone]   Fax:  [phone]  Physical Therapy Treatment  Patient Details  Name: Donna [name] MRN: [id number] Date of Birth: 11-19-1927 Referring Provider (PT): Abbey Chatters, NP   Encounter Date: 10/05/2020   PT End of Session - 10/05/20 1019     Visit Number 3    Number of Visits 12    Date for PT Re-Evaluation 11/03/20    Authorization Type [employer]    Authorization Time Period 09/22/20 to 11/03/20    Progress Note Due on Visit 10    PT Start Time 1015    PT Stop Time 1055    PT Time Calculation (min) 40 min    Activity Tolerance No increased pain;Patient tolerated treatment well    Behavior During Therapy WFL for tasks assessed/performed             Past Medical History:  Diagnosis Date   Aortic stenosis    Hemorrhage of gastrointestinal tract, unspecified    Hypertension    Migraine    Mitral regurgitation    OA (osteoarthritis)    Thyroid cyst     Past Surgical History:  Procedure Laterality Date   CATARACT EXTRACTION  1990   rt   CATARACT EXTRACTION  2005   left   LEFT HEART CATH AND CORONARY ANGIOGRAPHY N/A 10/06/2019   Procedure: LEFT HEART CATH AND CORONARY ANGIOGRAPHY;  Surgeon: Elder Negus, MD;  [facility];  Service: Cardiovascular;  Laterality: N/A;   REVISION TOTAL KNEE ARTHROPLASTY  2008   SVT ABLATION N/A 01/18/2020   Procedure: SVT ABLATION;  Surgeon: Marinus Maw, MD;  [facility]: [facility];  Service: Cardiovascular;  Laterality: N/A;   THUMB ARTHROSCOPY     TONSILLECTOMY      There were no vitals filed for this visit.   Subjective Assessment - 10/05/20 1018     Subjective I am doing well.  I did the exercises yesterday and they felt good.  No pain this morning.    Pertinent History HTN, migraines, Rt TKA 2008    Limitations Walking;House hold activities    How long can you  stand comfortably? sometimes unable to complete fixing a meal without needing intermittent breaks    Patient Stated Goals improve balance    Currently in Pain? No/denies                               [facility] Adult PT Treatment/Exercise - 10/05/20 0001       Exercises   Exercises Shoulder;Lumbar;Knee/Hip      Knee/Hip Exercises: Aerobic   Nustep L1 x 5 min with PT present to monitor      Knee/Hip Exercises: Standing   Hip Abduction AROM;Both;1 set;10 reps;Knee straight    Gait Training weight shifts at counter for glute squeeze x 1', 2 laps no AD close supervision 160' PT cued glut squeeze and two track foot placement to avoid scissoring foot placement      Knee/Hip Exercises: Seated   Long Arc Quad AROM;Both;2 sets;10 reps    Long Arc Quad Limitations ball between [employer] Squeeze 10x5" holds    Clamshell with TheraBand Yellow   10 reps   Other Seated Knee/Hip Exercises heel toe raises x 10    Marching Both;AROM;20 reps    Sit to [employer] 2 sets;5  reps;without UE support   use glutes to find upright posture each rep     Shoulder Exercises: Standing   Row Strengthening;Both;15 reps;Theraband    Theraband Level (Shoulder Row) Level 2 (Red)    Row Limitations high anchor                 Balance Exercises - 10/05/20 0001       Balance Exercises: Standing   Standing, One Foot on a Step 10 secs;4 inch;Eyes open;2 reps   bil UE support               PT Education - 10/05/20 1052     Education Details added standing red row and SLS at counter 2x10" holds bil UE support    Person(s) Educated Patient    Methods Explanation;Demonstration;Handout    Comprehension Verbalized understanding;Returned demonstration              PT Short Term Goals - 10/05/20 1102       PT SHORT TERM GOAL #1   Title Pt will be independent with her initial HEP to improve strength and balance.    Status On-going               PT Long Term Goals -  09/22/20 1127       PT LONG TERM GOAL #1   Title Pt will have greater than 4/5 MMT of the LE which will improve her power and mobility throughout the day.    Time 6    Period Weeks    Status New      PT LONG TERM GOAL #2   Title Pt will be able to complete 5x sit to stand in less 14 sec without the need for UE support on her thighs, to reflect improvements in LE strength and functional stability.    Time 6    Period Weeks    Status New      PT LONG TERM GOAL #3   Title Pt will have atleast 6 point increase on her BERG score which will reflect a clinically significant improvement in her balance and decrease her risk of falls.    Time 6    Period Weeks    Status New      PT LONG TERM GOAL #4   Title Pt will be able to ambulate throughout the clinic without an AD and without the need for PT supervision during an entire session.    Time 6    Period Weeks    Status New      PT LONG TERM GOAL #5   Title Pt will complete TUG with LRAD in less than 14 sec to reflect improvements in her mobility and safety with short walking distances.    Time 6    Period Weeks    Status New                   Plan - 10/05/20 1059     Clinical Impression Statement Pt arrived without pain and reported good tolerance of initial HEP.  She has brief LOB with gait x 160' without AD secondary to weakness and intermittent midline foot placement.  Close supervision with gait.  PT introduced weight shifts and gait with closed chain glute med activation today, followed by SLS foot on step.  PT added standing row red band and SLS at counter to HEP.  Pt did report slight lumbar soreness end of session.    PT Frequency 2x /  week    PT Duration 6 weeks    PT Treatment/Interventions ADLs/Self Care Home Management;Therapeutic activities;Therapeutic exercise;Neuromuscular re-education;Manual techniques;Patient/family education;Gait training    PT Next Visit Plan did soreness subside after last visit, do DGI,  continue with strength and balance, gait training as needed    PT Home Exercise Plan Access Code [email or web address]    Consulted and Agree with Plan of Care Patient             Patient will benefit from skilled therapeutic intervention in order to improve the following deficits and impairments:     Visit Diagnosis: Unsteadiness on feet     Problem List Patient Active Problem List   Diagnosis Date Noted   Excessive daytime sleepiness 07/25/2020   RLS (restless legs syndrome) 07/25/2020   Intermittent palpitations 07/25/2020   Nonrheumatic aortic valve insufficiency    Bradycardia 11/13/2019   Mitral regurgitation and aortic stenosis 11/12/2019   Severe mitral regurgitation 11/12/2019   SVT (supraventricular tachycardia) (HCC) 11/12/2019   Exertional dyspnea 10/02/2019   Overweight (BMI 25.0-29.9) 10/14/2017   BMI 27.0-27.9,adult 10/14/2017   Balance problem 10/14/2017   Neuropathic pain 09/03/2016   Bilateral nonexudative age-related macular degeneration 09/03/2016   Generalized osteoarthritis of multiple sites 05/07/2013   Hypothyroidism 05/07/2013   Cyst of thyroid 05/07/2013   Hyperhidrosis 05/07/2013   Essential hypertension, benign 05/07/2013   Cough 01/31/2012    Byrdie Miyazaki, PT 10/05/20 11:03 AM   [facility] [address] [place], [place], [address] Phone: [phone]   Fax:  [phone]  Name: Kaveri Perras MRN: [id number] Date of Birth: Jun 04, 1927

## 2020-10-10 ENCOUNTER — Encounter: Payer: Self-pay | Admitting: Physical Therapy

## 2020-10-10 ENCOUNTER — Other Ambulatory Visit: Payer: Self-pay

## 2020-10-10 ENCOUNTER — Ambulatory Visit: Payer: Medicare PPO | Admitting: Physical Therapy

## 2020-10-10 DIAGNOSIS — R2681 Unsteadiness on feet: Secondary | ICD-10-CM | POA: Diagnosis not present

## 2020-10-10 NOTE — Progress Notes (Signed)
[facility]   HOME SLEEP TEST REPORT ( by Watch PAT)   STUDY DEVICE provided 09-28-2020, data loaded 10-10-2020.   DOB:  04/27/1927   ORDERING CLINICIAN: Melvyn Novas, MD  REFERRING CLINICIAN: Dr. Odis Hollingshead, DO, [facility]    CLINICAL INFORMATION/HISTORY: Donna [name] is a 87 year old female patient was referred for a sleep consultation by her cardiologist, Dr. Billy Coast.  The patient reportedly has high blood pressures in the morning and irregular heartbeats she easily gets short of breath.  Her health problems started with her husband's death in 2021-07-11after a long battle with dementia.  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  She herself has a history of aortic stenosis, GI hemorrhage, hypertension, hearing loss, migraine, mitral valve regurgitation, osteoarthritis and spinal stenosis, vitamin B12 related deficiency anemia, SVTs, a thyroid cyst and she has undergone tonsillectomy, and in childhood had broken her nose.   She reports not sleeping well at night due to neck restlessness and being sleepy in daytime.  She is severely vision impaired due to macular degeneration.  She sleeps in intervals of 1 or 2 hours duration and wakes for 1 bathroom break at night only but restless legs make it hard for her to return to sleep.  Naps are taken frequently.     Epworth sleepiness score: 11/24. FSS at 48/63 points   BMI: 27kg/m   Neck Circumference: 15"   FINDINGS:   Sleep Summary:   Total Recording Time (hours, min): Total recording time for this home sleep test amounted to 8 hours 17 minutes of which 7 hours and 16 minutes were total sleep time and with REM sleep at 13.5%                                   Respiratory Indices:   The calculated AHI was 11.3/h which is a mild form of apnea, during REM sleep the AHI exacerbated to 22.6/h in non-REM sleep to 9.6/h.    There was a strong supine positional component noted with his supine AHI being 17.3/h  and the nonsupine AHI of 4.9/h.  Snoring was mild to moderate with a mean volume of 40 dB and snoring was recorded for only about 11% of the total sleep time.                                                    Oxygen Saturation Statistics:  O2 Saturation Range (%): The oxygen saturation varied between a nadir of 76% and a maximum of 97% with a mean saturation of 93%.  Hypoxia was present at O2 Saturation (minutes) <89% for 5.5 minutes, the equivalent of 1.2% of the total sleep time.  This was clinically irrelevant.           Pulse Rate Statistics: Heart rate varied between 49 bpm and a maximum of 84 bpm, mean heart rate was 62 bpm.    Please note that a home sleep test cannot give evidence of periodic limb movements, cardiac rhythm abnormalities and can only calculate the possible percentage of central apneas.               IMPRESSION:  This HST confirms the presence of mild obstructive sleep apnea with an  11.3/h.  This apnea however is REM sleep dependent and positional dependent.   The attached hypnogram shows that the patient had several arousals during the last third of her sleep time. Sleep did not appear severely fragmented.   RECOMMENDATION: Donna [name] should avoid sleeping on her back.  Her apnea is mild and does not cause significant hypoxia yet it is strongly REM sleep dependent.  I would recommend a trial of CPAP with a nasal pillow, and a setting between 5 and 12 cmH2O to centimeter EPR and heated humidification.  This will hopefully allow her to also sleep on her side and not rely on supine sleep only.    As to her restless leg concern I will start the patient on a very low-dose of gabapentin and see if this will help her to get more quality sleep.  She described creepy sensations or creepy crawly sensations at night sometimes also a burning sensation and she has lost the sense of vibration in both feet.  She also clearly has brisk reflexes so I have wondered if there is still  some degenerative disc disease or spinal stenosis present on top of a neuropathy.    INTERPRETING PHYSICIAN:   Carmen Dohmeier, MD   Medical Director of [facility].                   

## 2020-10-10 NOTE — Therapy (Signed)
[facility] [address], [address] [place], [place], [address] Phone: [phone]   Fax:  [phone]  Physical Therapy Treatment  Patient Details  Name: Donna [name] MRN: [id number] Date of Birth: 02/07/1927 Referring Provider (PT): Abbey Chatters, NP   Encounter Date: 10/10/2020   PT End of Session - 10/10/20 1148     Visit Number 4    Number of Visits 12    Date for PT Re-Evaluation 11/03/20    Authorization Type [employer]    Authorization Time Period 09/22/20 to 11/03/20    Progress Note Due on Visit 10    PT Start Time 1146    PT Stop Time 1220    PT Time Calculation (min) 34 min    Activity Tolerance No increased pain;Patient tolerated treatment well    Behavior During Therapy WFL for tasks assessed/performed             Past Medical History:  Diagnosis Date   Aortic stenosis    Hemorrhage of gastrointestinal tract, unspecified    Hypertension    Migraine    Mitral regurgitation    OA (osteoarthritis)    Thyroid cyst     Past Surgical History:  Procedure Laterality Date   CATARACT EXTRACTION  1990   rt   CATARACT EXTRACTION  2005   left   LEFT HEART CATH AND CORONARY ANGIOGRAPHY N/A 10/06/2019   Procedure: LEFT HEART CATH AND CORONARY ANGIOGRAPHY;  Surgeon: Elder Negus, MD;  [facility]: [facility];  Service: Cardiovascular;  Laterality: N/A;   REVISION TOTAL KNEE ARTHROPLASTY  2008   SVT ABLATION N/A 01/18/2020   Procedure: SVT ABLATION;  Surgeon: Marinus Maw, MD;  Location: [facility];  Service: Cardiovascular;  Laterality: N/A;   THUMB ARTHROSCOPY     TONSILLECTOMY      There were no vitals filed for this visit.   Subjective Assessment - 10/10/20 1150     Subjective Did fine after last session. I am stiff today.    Pertinent History HTN, migraines, Rt TKA 2008    Currently in Pain? No/denies    Multiple Pain Sites No                                [facility] Adult PT Treatment/Exercise - 10/10/20 0001       Knee/Hip Exercises: Aerobic   Nustep L2 x 8 min with PTa present      Knee/Hip Exercises: Standing   Heel Raises Both;1 set;10 reps    Knee Flexion AROM;Both;1 set;10 reps    Hip Abduction AROM;Stengthening;Both;1 set   12 reps   Hip Extension AROM;Both;1 set;10 reps;Knee straight    Forward Step Up Both;1 set;10 reps;Hand Hold: 2;Step Height: 6"   light UE     Knee/Hip Exercises: Seated   Long Arc Quad Strengthening;Both;1 set;10 reps;Weights    Long Arc Quad Weight 2 lbs.    Marching Strengthening;Both;1 set;20 reps;Weights    Marching Limitations 2#    Sit to Sand 1 set;10 reps;without UE support   holding 5#KB                      PT Short Term Goals - 10/10/20 1208       PT SHORT TERM GOAL #1   Title Pt will be independent with her initial HEP to improve strength and balance.    Time  3    Period Weeks    Status Achieved               PT Long Term Goals - 09/22/20 1127       PT LONG TERM GOAL #1   Title Pt will have greater than 4/5 MMT of the LE which will improve her power and mobility throughout the day.    Time 6    Period Weeks    Status New      PT LONG TERM GOAL #2   Title Pt will be able to complete 5x sit to stand in less 14 sec without the need for UE support on her thighs, to reflect improvements in LE strength and functional stability.    Time 6    Period Weeks    Status New      PT LONG TERM GOAL #3   Title Pt will have atleast 6 point increase on her BERG score which will reflect a clinically significant improvement in her balance and decrease her risk of falls.    Time 6    Period Weeks    Status New      PT LONG TERM GOAL #4   Title Pt will be able to ambulate throughout the clinic without an AD and without the need for PT supervision during an entire session.    Time 6    Period Weeks    Status New      PT LONG TERM GOAL #5    Title Pt will complete TUG with LRAD in less than 14 sec to reflect improvements in her mobility and safety with short walking distances.    Time 6    Period Weeks    Status New                   Plan - 10/10/20 1208     Clinical Impression Statement Pt arrives with no new complaints today, only a feeling of joint stiffness. Pt is compliant with HEP, meeting STGs. pt was able to progress pretty much her whole routine whether it was adding resisatnce or sets/reps. Pt did get fatigued with todays routine. treatment was stopped a little early due to fatigue and the goal not making her too sore. Pt will be here on Wednesday of this week to let us know how she felt after todays workout incase we need to modify.    Personal Factors and Comorbidities Age;Time since onset of injury/illness/exacerbation;Fitness    Examination-Activity Limitations Locomotion Level;Stand    Examination-Participation Restrictions Cleaning;Community Activity    Stability/Clinical Decision Making Stable/Uncomplicated    Rehab Potential Good    PT Frequency 2x / week    PT Duration 6 weeks    PT Treatment/Interventions ADLs/Self Care Home Management;Therapeutic activities;Therapeutic exercise;Neuromuscular re-education;Manual techniques;Patient/family education;Gait training    PT Next Visit Plan Do TUG and 5x sit to stand  for LTG check, see how pt tolerated todays session adding the resistance etc..make any kind of modifications if needed.    PT Home Exercise Plan Access Code [email or web address]    Consulted and Agree with Plan of Care Patient             Patient will benefit from skilled therapeutic intervention in order to improve the following deficits and impairments:  Pain, Decreased coordination, Decreased mobility, Decreased strength, Decreased range of motion, Decreased endurance, Decreased activity tolerance, Decreased balance, Difficulty walking, Impaired flexibility  Visit Diagnosis: Unsteadiness on  feet     Problem  List Patient Active Problem List   Diagnosis Date Noted   Excessive daytime sleepiness 07/25/2020   RLS (restless legs syndrome) 07/25/2020   Intermittent palpitations 07/25/2020   Nonrheumatic aortic valve insufficiency    Bradycardia 11/13/2019   Mitral regurgitation and aortic stenosis 11/12/2019   Severe mitral regurgitation 11/12/2019   SVT (supraventricular tachycardia) (HCC) 11/12/2019   Exertional dyspnea 10/02/2019   Overweight (BMI 25.0-29.9) 10/14/2017   BMI 27.0-27.9,adult 10/14/2017   Balance problem 10/14/2017   Neuropathic pain 09/03/2016   Bilateral nonexudative age-related macular degeneration 09/03/2016   Generalized osteoarthritis of multiple sites 05/07/2013   Hypothyroidism 05/07/2013   Cyst of thyroid 05/07/2013   Hyperhidrosis 05/07/2013   Essential hypertension, benign 05/07/2013   Cough 01/31/2012    Edyn Popoca, PTA 10/10/2020, 12:23 PM  [facility] [address] [place], [place], [address] Phone: [phone]   Fax:  [phone]  Name: Janavia Rottman MRN: [id number] Date of Birth: May 18, 1927

## 2020-10-12 ENCOUNTER — Encounter: Payer: Self-pay | Admitting: Physical Therapy

## 2020-10-12 ENCOUNTER — Ambulatory Visit: Payer: Medicare PPO | Admitting: Physical Therapy

## 2020-10-12 ENCOUNTER — Other Ambulatory Visit: Payer: Self-pay

## 2020-10-12 DIAGNOSIS — R2681 Unsteadiness on feet: Secondary | ICD-10-CM

## 2020-10-12 NOTE — Therapy (Signed)
[facility] [address], [address] [place], [place], [address] Phone: [phone]   Fax:  [phone]  Physical Therapy Treatment  Patient Details  Name: Donna [name] MRN: [id number] Date of Birth: Nov 26, 1927 Referring Provider (PT): Abbey Chatters, NP   Encounter Date: 10/12/2020   PT End of Session - 10/12/20 1017     Visit Number 5    Number of Visits 12    Date for PT Re-Evaluation 11/03/20    Authorization Type [employer]    Authorization Time Period 09/22/20 to 11/03/20    Authorization - Visit Number 5    Authorization - Number of Visits 12    Progress Note Due on Visit 10    PT Start Time 1015    PT Stop Time 1053    PT Time Calculation (min) 38 min    Activity Tolerance No increased pain;Patient tolerated treatment well    Behavior During Therapy WFL for tasks assessed/performed             Past Medical History:  Diagnosis Date   Aortic stenosis    Hemorrhage of gastrointestinal tract, unspecified    Hypertension    Migraine    Mitral regurgitation    OA (osteoarthritis)    Thyroid cyst     Past Surgical History:  Procedure Laterality Date   CATARACT EXTRACTION  1990   rt   CATARACT EXTRACTION  2005   left   LEFT HEART CATH AND CORONARY ANGIOGRAPHY N/A 10/06/2019   Procedure: LEFT HEART CATH AND CORONARY ANGIOGRAPHY;  Surgeon: Elder Negus, MD;  [facility];  Service: Cardiovascular;  Laterality: N/A;   REVISION TOTAL KNEE ARTHROPLASTY  2008   SVT ABLATION N/A 01/18/2020   Procedure: SVT ABLATION;  Surgeon: Marinus Maw, MD;  [facility];  Service: Cardiovascular;  Laterality: N/A;   THUMB ARTHROSCOPY     TONSILLECTOMY      There were no vitals filed for this visit.   Subjective Assessment - 10/12/20 1017     Subjective I was just tired after last session but no increased pain.    Pertinent History HTN, migraines, Rt TKA 2008    Limitations  Walking;House hold activities    How long can you stand comfortably? sometimes unable to complete fixing a meal without needing intermittent breaks    Patient Stated Goals improve balance    Currently in Pain? No/denies                               [facility] Adult PT Treatment/Exercise - 10/12/20 0001       Exercises   Exercises Shoulder;Lumbar;Knee/Hip      Lumbar Exercises: Aerobic   Nustep L2x8' seat 7 arms 11, PT present to monitor pain and discuss progress      Lumbar Exercises: Standing   Heel Raises 10 reps      Knee/Hip Exercises: Standing   Knee Flexion AROM;20 reps    Knee Flexion Limitations taps to 6" step, bil UE support    Hip Abduction AROM;Stengthening;Both;1 set   12 reps   Hip Extension AROM;Both;1 set;10 reps;Knee straight    Forward Step Up Both;1 set;10 reps;Hand Hold: 2;Step Height: 6"   light UE   Rocker Board 2 minutes      Knee/Hip Exercises: Seated   Long Arc Quad Strengthening;Both;Weights;10 reps    Long Arc Quad Weight 2 lbs.  Marching Strengthening;20 reps;Weights;Both    Marching Limitations 2    Sit to Sand 1 set;10 reps;without UE support   holding 5#KB                Balance Exercises - 10/12/20 0001       Balance Exercises: Standing   Retro Gait 1 rep   across carpet, gait belt close supervision   Sidestepping 2 reps   across carpet, gait belt close supervision   Other Standing Exercises box stepping x 5 boxes each direction    Other Standing Exercises Comments 90 deg footwork turns for 360 each way                  PT Short Term Goals - 10/10/20 1208       PT SHORT TERM GOAL #1   Title Pt will be independent with her initial HEP to improve strength and balance.    Time 3    Period Weeks    Status Achieved               PT Long Term Goals - 09/22/20 1127       PT LONG TERM GOAL #1   Title Pt will have greater than 4/5 MMT of the LE which will improve her power and mobility  throughout the day.    Time 6    Period Weeks    Status New      PT LONG TERM GOAL #2   Title Pt will be able to complete 5x sit to stand in less 14 sec without the need for UE support on her thighs, to reflect improvements in LE strength and functional stability.    Time 6    Period Weeks    Status New      PT LONG TERM GOAL #3   Title Pt will have atleast 6 point increase on her BERG score which will reflect a clinically significant improvement in her balance and decrease her risk of falls.    Time 6    Period Weeks    Status New      PT LONG TERM GOAL #4   Title Pt will be able to ambulate throughout the clinic without an AD and without the need for PT supervision during an entire session.    Time 6    Period Weeks    Status New      PT LONG TERM GOAL #5   Title Pt will complete TUG with LRAD in less than 14 sec to reflect improvements in her mobility and safety with short walking distances.    Time 6    Period Weeks    Status New                   Plan - 10/12/20 1053     Clinical Impression Statement Pt reported good tolerance of last session but with fatigue x several hours afterwards.  PT did not advance weights or current standing and seated ther ex but did add standing rockerboard and gait training for functional balance and footwork for stepping and turning strategies.  Pt encouraged Pt to respect her fatigue after today's session for safety and to let us know at her next appointment how long she felt it took her energy to return to determine tolerance of current level of ther ex.  PT used gait belt and close supervision for gait training today without use of Pt's cane.  Pt with good activaiton of glut  med in standing hip ther ex today.  Continue along POC with close supervision.    Rehab Potential Good    PT Frequency 2x / week    PT Duration 6 weeks    PT Treatment/Interventions ADLs/Self Care Home Management;Therapeutic activities;Therapeutic  exercise;Neuromuscular re-education;Manual techniques;Patient/family education;Gait training    PT Next Visit Plan Do TUG and 5x sit to stand  for LTG check, how did Pt recover after last session, continue gait training/stepping strategies, advance weights to 2 sets if ready    PT Home Exercise Plan Access Code [email or web address]    Consulted and Agree with Plan of Care Patient             Patient will benefit from skilled therapeutic intervention in order to improve the following deficits and impairments:     Visit Diagnosis: Unsteadiness on feet     Problem List Patient Active Problem List   Diagnosis Date Noted   Excessive daytime sleepiness 07/25/2020   RLS (restless legs syndrome) 07/25/2020   Intermittent palpitations 07/25/2020   Nonrheumatic aortic valve insufficiency    Bradycardia 11/13/2019   Mitral regurgitation and aortic stenosis 11/12/2019   Severe mitral regurgitation 11/12/2019   SVT (supraventricular tachycardia) (HCC) 11/12/2019   Exertional dyspnea 10/02/2019   Overweight (BMI 25.0-29.9) 10/14/2017   BMI 27.0-27.9,adult 10/14/2017   Balance problem 10/14/2017   Neuropathic pain 09/03/2016   Bilateral nonexudative age-related macular degeneration 09/03/2016   Generalized osteoarthritis of multiple sites 05/07/2013   Hypothyroidism 05/07/2013   Cyst of thyroid 05/07/2013   Hyperhidrosis 05/07/2013   Essential hypertension, benign 05/07/2013   Cough 01/31/2012    Keian Odriscoll, PT 10/12/20 10:58 AM   [facility] [address] [place], [place], [address] Phone: [phone]   Fax:  [phone]  Name: Marion Seese MRN: [id number] Date of Birth: [birth year]

## 2020-10-18 ENCOUNTER — Other Ambulatory Visit: Payer: Self-pay

## 2020-10-18 ENCOUNTER — Ambulatory Visit: Payer: Medicare PPO | Attending: Nurse Practitioner | Admitting: Physical Therapy

## 2020-10-18 ENCOUNTER — Encounter: Payer: Self-pay | Admitting: Physical Therapy

## 2020-10-18 DIAGNOSIS — R2681 Unsteadiness on feet: Secondary | ICD-10-CM | POA: Insufficient documentation

## 2020-10-18 DIAGNOSIS — M6281 Muscle weakness (generalized): Secondary | ICD-10-CM | POA: Insufficient documentation

## 2020-10-18 NOTE — Therapy (Signed)
[facility] [place], [place], [address] Phone:     Fax:     Physical Therapy Treatment  Patient Details  Name: Donna [name] MRN: [id number] Date of Birth: 04-11-1927 Referring Provider (PT): Sherrie Mustache, NP   Encounter Date: 10/18/2020   PT End of Session - 10/18/20 1237     Visit Number 6    Number of Visits 12    Date for PT Re-Evaluation 11/03/20    Authorization Type [employer]    Authorization Time Period 09/22/20 to 11/03/20    Authorization - Visit Number 6    Authorization - Number of Visits 12    Progress Note Due on Visit 10    PT Start Time 1232    PT Stop Time 5638    PT Time Calculation (min) 41 min    Activity Tolerance No increased pain;Patient tolerated treatment well    Behavior During Therapy WFL for tasks assessed/performed             Past Medical History:  Diagnosis Date   Aortic stenosis    Hemorrhage of gastrointestinal tract, unspecified    Hypertension    Migraine    Mitral regurgitation    OA (osteoarthritis)    Thyroid cyst     Past Surgical History:  Procedure Laterality Date   CATARACT EXTRACTION  1990   rt   CATARACT EXTRACTION  2005   left   LEFT HEART CATH AND CORONARY ANGIOGRAPHY N/A 10/06/2019   Procedure: LEFT HEART CATH AND CORONARY ANGIOGRAPHY;  Surgeon: Nigel Mormon, MD;  Location: [facility];  Service: Cardiovascular;  Laterality: N/A;   REVISION TOTAL KNEE ARTHROPLASTY  2008   SVT ABLATION N/A 01/18/2020   Procedure: SVT ABLATION;  Surgeon: Evans Lance, MD;  Location: [facility];  Service: Cardiovascular;  Laterality: N/A;   THUMB ARTHROSCOPY     TONSILLECTOMY      There were no vitals filed for this visit.   Subjective Assessment - 10/18/20 1234     Subjective My back is a little sore today from a bad night with restless leg.  I did fine after last time and wasn't too tired.  I feel like I lose my balance more at  home than when walking around here at PT.  I lost my balance while brushing my hair and when getting dressed.    Pertinent History HTN, migraines, Rt TKA 2008    Limitations Walking;House hold activities    How long can you stand comfortably? sometimes unable to complete fixing a meal without needing intermittent breaks    Patient Stated Goals improve balance    Currently in Pain? No/denies                               [facility] Adult PT Treatment/Exercise - 10/18/20 0001       Exercises   Exercises Shoulder;Lumbar;Knee/Hip      Lumbar Exercises: Aerobic   Nustep L3 x 8' seat 7 arms 11, PT present to discuss progress and balance confidence at home      Knee/Hip Exercises: Standing   Hip Extension AROM;Both;1 set;10 reps;Knee straight    Extension Limitations bil UE support    Other Standing Knee Exercises marching on foam pad taps to first step x 20      Knee/Hip Exercises: Seated   Long Arc [employer]  Strengthening;Both;2 sets;10 reps;Weights    Long Arc Quad Weight 2 lbs.    Marching Strengthening;Both;20 reps;Weights    Marching Limitations 2    Sit to Sand 1 set;10 reps;without UE support   holding 5#KB     Shoulder Exercises: Standing   Other Standing Exercises yellow tband bil pullbacks and pullforwards small range for trunk stabilization 10x5" holds each way                 Balance Exercises - 10/18/20 0001       Balance Exercises: Standing   Retro Gait 3 reps   min guard   Sidestepping 2 reps   min guard   Other Standing Exercises forward step with contralateral reach 5x5" holds, diag step and reach ipsilateral UE/LE 3x5" holds bil    Other Standing Exercises Comments forward gait with focus on longer stride length 3x15' with gait belt and min guard                  PT Short Term Goals - 10/10/20 1208       PT SHORT TERM GOAL #1   Title Pt will be independent with her initial HEP to improve strength and balance.    Time 3    Period  Weeks    Status Achieved               PT Long Term Goals - 10/18/20 1248       PT LONG TERM GOAL #2   Title Pt will be able to complete 5x sit to stand in less 14 sec without the need for UE support on her thighs, to reflect improvements in LE strength and functional stability.    Baseline 13 sec    Status Achieved      PT LONG TERM GOAL #5   Title Pt will complete TUG with LRAD in less than 14 sec to reflect improvements in her mobility and safety with short walking distances.    Baseline 15 sec without AD    Status On-going                   Plan - 10/18/20 1241     Clinical Impression Statement Pt arrived tired and with lumbar soreness due to bad night with restless leg syndrome.  She loosened up throughout session and was able to fully participate.  She did report good tolerance after last visit without too much fatigue so PT increased seated LE ther ex to 2 sets today.  Pt met 5x sit to stand goal today performing in 13 sec (goal 14 sec).  She nearly met LTG for TUG today in 15 sec without AD (goal 15 sec).  She reports she loses her balance at home with tasks requiring UEs such as brushing hair and dressing upper body.  PT added some UE ROM tasks with close supervision to balance challenges today.  PT continues to use gait belt and min guard with higher level gait and balance challenges.  Continue along POC.    PT Frequency 2x / week    PT Duration 6 weeks    PT Next Visit Plan continue gait/balance training incorporating ankle and trunk stabilization strategies, see how Pt did with advanced weight sets    PT Home Exercise Plan Access Code [email or web address]    Consulted and Agree with Plan of Care Patient             Patient will benefit from skilled therapeutic intervention in  order to improve the following deficits and impairments:     Visit Diagnosis: Unsteadiness on feet     Problem List Patient Active Problem List   Diagnosis Date Noted   Excessive  daytime sleepiness 07/25/2020   RLS (restless legs syndrome) 07/25/2020   Intermittent palpitations 07/25/2020   Nonrheumatic aortic valve insufficiency    Bradycardia 11/13/2019   Mitral regurgitation and aortic stenosis 11/12/2019   Severe mitral regurgitation 11/12/2019   SVT (supraventricular tachycardia) ([place]) 11/12/2019   Exertional dyspnea 10/02/2019   Overweight (BMI 25.0-29.9) 10/14/2017   BMI 27.0-27.9,adult 10/14/2017   Balance problem 10/14/2017   Neuropathic pain 09/03/2016   Bilateral nonexudative age-related macular degeneration 09/03/2016   Generalized osteoarthritis of multiple sites 05/07/2013   Hypothyroidism 05/07/2013   Cyst of thyroid 05/07/2013   Hyperhidrosis 05/07/2013   Essential hypertension, benign 05/07/2013   Cough 01/31/2012    Sydny Schnitzler, PT 10/18/20 1:17 PM  [facility] [place], [place], [address] Phone:     Fax:     Name: Donna [name] MRN: [id number] Date of Birth: 08-28-1927

## 2020-10-19 ENCOUNTER — Other Ambulatory Visit: Payer: Self-pay | Admitting: Neurology

## 2020-10-19 ENCOUNTER — Telehealth: Payer: Self-pay | Admitting: Neurology

## 2020-10-19 MED ORDER — GABAPENTIN 25 MG PO TABS: 2.0000 | ORAL_TABLET | Freq: Every day | ORAL | Status: DC

## 2020-10-19 MED ORDER — GABAPENTIN 25 MG PO TABS: 2.0000 | ORAL_TABLET | Freq: Every day | ORAL | 5 refills | Status: DC

## 2020-10-19 MED ORDER — GABAPENTIN 250 MG/5ML PO SOLN: 50.0000 mg | Freq: Every day | ORAL | 5 refills | Status: DC

## 2020-10-19 NOTE — Telephone Encounter (Signed)
Called pt back. She has been taking gabapentin 100mg  po qhs since she last saw Dr. . Having memory issues since starting on medication. Examples: went to church yesterday to take care of a couple things. Forgot to do one thing she went there for. This is not typical of her to do. Also, around the house, she forgets if she has done things when she just did something a few min before. Has not started any other new meds since. Denies any signs of infection. Advised we will discuss w/ MD and call back w/ recommendation. Spoke w/ MD who recommended decreasing dose to 25mg , take 2 tabs po qhs. #60, 5 refills. E-scribed to pharmacy. Called pt and she was agreeable to try this instead. She will call if she continues to have memory issues on lower dose.

## 2020-10-19 NOTE — Telephone Encounter (Signed)
Pt called stating that the gabapentin (NEURONTIN) 100 MG capsule has helped with her RLS but has noticed that her memory is not the same since she started using the medication and memory loss is not a normal thing for her. Please advise.

## 2020-10-20 ENCOUNTER — Other Ambulatory Visit: Payer: Self-pay

## 2020-10-20 ENCOUNTER — Encounter: Payer: Self-pay | Admitting: Physical Therapy

## 2020-10-20 ENCOUNTER — Ambulatory Visit: Payer: Medicare PPO | Admitting: Physical Therapy

## 2020-10-20 DIAGNOSIS — R2681 Unsteadiness on feet: Secondary | ICD-10-CM

## 2020-10-20 NOTE — Patient Instructions (Signed)
Access Code: [email or web address] URL: [email or web address] Date: 10/20/2020 Prepared by: [facility]  Exercises Standing Heel Raise with Support - 1 x daily - 7 x weekly - 20 reps Standing Hip Abduction with Counter Support - 1 x daily - 7 x weekly - 10 reps Standing High Row with Anchored Resistance - 1 x daily - 7 x weekly - 1 sets - 15 reps Standing Single Leg Stance with Counter Support - 1 x daily - 7 x weekly - 1 sets - 2 reps - 10 hold Toe Raises with Unilateral Counter Support - 1 x daily - 7 x weekly - 1 sets - 20 reps Sit to Stand Without Arm Support - 1 x daily - 7 x weekly - 3 sets - 10 reps   [facility] [address], [address] [place], [place] [address] Phone # [phone] Fax [phone]

## 2020-10-20 NOTE — Therapy (Signed)
[facility] @ [facility] [address] [place], [place], [address] Phone:     Fax:     Physical Therapy Treatment  Patient Details  Name: Donna [name] MRN: [id number] Date of Birth: Oct 31, 1927 Referring Provider (PT): Abbey Chatters, NP   Encounter Date: 10/20/2020   PT End of Session - 10/20/20 1100     Visit Number 7    Number of Visits 12    Date for PT Re-Evaluation 11/03/20    Authorization Type [employer]    Authorization Time Period 09/22/20 to 11/03/20    Authorization - Visit Number 7    Authorization - Number of Visits 12    Progress Note Due on Visit 10    PT Start Time 1024   pt arrived late   PT Stop Time 1100    PT Time Calculation (min) 36 min    Activity Tolerance No increased pain;Patient tolerated treatment well    Behavior During Therapy WFL for tasks assessed/performed             Past Medical History:  Diagnosis Date   Aortic stenosis    Hemorrhage of gastrointestinal tract, unspecified    Hypertension    Migraine    Mitral regurgitation    OA (osteoarthritis)    Thyroid cyst     Past Surgical History:  Procedure Laterality Date   CATARACT EXTRACTION  1990   rt   CATARACT EXTRACTION  2005   left   LEFT HEART CATH AND CORONARY ANGIOGRAPHY N/A 10/06/2019   Procedure: LEFT HEART CATH AND CORONARY ANGIOGRAPHY;  Surgeon: Elder Negus, MD;  [facility];  Service: Cardiovascular;  Laterality: N/A;   REVISION TOTAL KNEE ARTHROPLASTY  2008   SVT ABLATION N/A 01/18/2020   Procedure: SVT ABLATION;  Surgeon: Marinus Maw, MD;  Location: [facility] INV[facility];  Service: Cardiovascular;  Laterality: N/A;   THUMB ARTHROSCOPY     TONSILLECTOMY      There were no vitals filed for this visit.   Subjective Assessment - 10/20/20 1026     Subjective Pt states that things are going ok.    Pertinent History HTN, migraines, Rt TKA 2008    Limitations Walking;House hold activities    How  long can you stand comfortably? sometimes unable to complete fixing a meal without needing intermittent breaks    Patient Stated Goals improve balance    Currently in Pain? No/denies                               [facility] Adult PT Treatment/Exercise - 10/20/20 0001       Lumbar Exercises: Aerobic   Nustep L3 x5 min, seat 7 PT present to discuss balance concerns at home      Knee/Hip Exercises: Standing   Forward Step Up 1 set;Both;10 reps    Forward Step Up Limitations opposite leg skipping a step to encourage more hip extension on stance leg   BUE support hands resting top of handrail                Balance Exercises - 10/20/20 0001       Balance Exercises: Standing   Tandem Stance Eyes open;Foam/compliant surface   cones in upper cabinet dual task listing items   Tandem Gait 2 reps   x12ft CGA   Other Standing Exercises NBOS on foam pad pt placing 3  cones in a row based on color combo provided by PT                PT Education - 10/20/20 1100     Education Details updates to [employer] Educated Patient    Methods Explanation;Handout    Comprehension Verbalized understanding              PT Short Term Goals - 10/10/20 1208       PT SHORT TERM GOAL #1   Title Pt will be independent with her initial HEP to improve strength and balance.    Time 3    Period Weeks    Status Achieved               PT Long Term Goals - 10/18/20 1248       PT LONG TERM GOAL #2   Title Pt will be able to complete 5x sit to stand in less 14 sec without the need for UE support on her thighs, to reflect improvements in LE strength and functional stability.    Baseline 13 sec    Status Achieved      PT LONG TERM GOAL #5   Title Pt will complete TUG with LRAD in less than 14 sec to reflect improvements in her mobility and safety with short walking distances.    Baseline 15 sec without AD    Status On-going                   Plan -  10/20/20 1101     Clinical Impression Statement Today's session focused on increasing the level of difficulty of pt's HEP and progressing to standing position. Pt was able to complete all exercise progressions and required intitial verbal cuing from PT to improve her posture with standing toe raises. Pt feels she has the most difficulty with balance during random activity at home when she is not focused on steadying herself. PT provided close supervision and contact guard assist with dual task balance activity. Pt had several near LOB with PT assisting correction while on the foam pad. Pt verbalized understanding of HEP updates.    PT Frequency 2x / week    PT Duration 6 weeks    PT Next Visit Plan continue gait/balance training-dual task, include ankle/hip reactions, continue to progress HEP and LE strength    PT Home Exercise Plan Access Code [email or web address]    Consulted and Agree with Plan of Care Patient             Patient will benefit from skilled therapeutic intervention in order to improve the following deficits and impairments:     Visit Diagnosis: Unsteadiness on feet     Problem List Patient Active Problem List   Diagnosis Date Noted   Excessive daytime sleepiness 07/25/2020   RLS (restless legs syndrome) 07/25/2020   Intermittent palpitations 07/25/2020   Nonrheumatic aortic valve insufficiency    Bradycardia 11/13/2019   Mitral regurgitation and aortic stenosis 11/12/2019   Severe mitral regurgitation 11/12/2019   SVT (supraventricular tachycardia) (HCC) 11/12/2019   Exertional dyspnea 10/02/2019   Overweight (BMI 25.0-29.9) 10/14/2017   BMI 27.0-27.9,adult 10/14/2017   Balance problem 10/14/2017   Neuropathic pain 09/03/2016   Bilateral nonexudative age-related macular degeneration 09/03/2016   Generalized osteoarthritis of multiple sites 05/07/2013   Hypothyroidism 05/07/2013   Cyst of thyroid 05/07/2013   Hyperhidrosis 05/07/2013   Essential hypertension, benign  05/07/2013   Cough 01/31/2012   11:46  AM,10/20/20 Donita Brooks PT, DPT [facility] [place]  [phone]   [facility] @ [facility] [address] [place], [place], [address] Phone:     Fax:     Name: Donna [name] MRN: [id number] Date of Birth: [birth year]

## 2020-10-23 DIAGNOSIS — G4733 Obstructive sleep apnea (adult) (pediatric): Secondary | ICD-10-CM | POA: Insufficient documentation

## 2020-10-23 NOTE — Addendum Note (Signed)
Addended by: Melvyn Novas on: 10/23/2020 08:10 PM   Modules accepted: Orders

## 2020-10-23 NOTE — Procedures (Signed)
[facility]   HOME SLEEP TEST REPORT ( by Watch PAT)   STUDY DEVICE provided 09-28-2020, data loaded 10-10-2020.   DOB:  1927-09-26   ORDERING CLINICIAN: Melvyn Novas, MD  REFERRING CLINICIAN: Dr. Odis Hollingshead, DO, [facility]    CLINICAL INFORMATION/HISTORY: Donna [name] is a 87 year old female patient was referred for a sleep consultation by her cardiologist, Dr. Billy Coast.  The patient reportedly has high blood pressures in the morning and irregular heartbeats she easily gets short of breath.  Her health problems started with her husband's death in 2021/07/08after a long battle with dementia.  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  She herself has a history of aortic stenosis, GI hemorrhage, hypertension, hearing loss, migraine, mitral valve regurgitation, osteoarthritis and spinal stenosis, vitamin B12 related deficiency anemia, SVTs, a thyroid cyst and she has undergone tonsillectomy, and in childhood had broken her nose.   She reports not sleeping well at night due to neck restlessness and being sleepy in daytime.  She is severely vision impaired due to macular degeneration.  She sleeps in intervals of 1 or 2 hours duration and wakes for 1 bathroom break at night only but restless legs make it hard for her to return to sleep.  Naps are taken frequently.     Epworth sleepiness score: 11/24. FSS at 48/63 points   BMI: 27kg/m   Neck Circumference: 15"   FINDINGS:   Sleep Summary:   Total Recording Time (hours, min): Total recording time for this home sleep test amounted to 8 hours 17 minutes of which 7 hours and 16 minutes were total sleep time and with REM sleep at 13.5%                                   Respiratory Indices:   The calculated AHI was 11.3/h which is a mild form of apnea, during REM sleep the AHI exacerbated to 22.6/h in non-REM sleep to 9.6/h.    There was a strong supine positional component noted with his supine AHI being 17.3/h and the  nonsupine AHI of 4.9/h.  Snoring was mild to moderate with a mean volume of 40 dB and snoring was recorded for only about 11% of the total sleep time.                                                    Oxygen Saturation Statistics:  O2 Saturation Range (%): The oxygen saturation varied between a nadir of 76% and a maximum of 97% with a mean saturation of 93%.  Hypoxia was present at O2 Saturation (minutes) <89% for 5.5 minutes, the equivalent of 1.2% of the total sleep time.  This was clinically irrelevant.           Pulse Rate Statistics: Heart rate varied between 49 bpm and a maximum of 84 bpm, mean heart rate was 62 bpm.    Please note that a home sleep test cannot give evidence of periodic limb movements, cardiac rhythm abnormalities and can only calculate the possible percentage of central apneas.               IMPRESSION:  This HST confirms the presence of mild obstructive sleep apnea with an AHI of  11.3/h.  This apnea however is REM sleep dependent and positional dependent.   The attached hypnogram shows that the patient had several arousals during the last third of her sleep time. Sleep did not appear severely fragmented.   RECOMMENDATION: Donna [name] should avoid sleeping on her back.  Her apnea is mild and does not cause significant hypoxia yet it is strongly REM sleep dependent.  I would recommend a trial of CPAP with a nasal pillow, and a setting between 5 and 12 cmH2O to centimeter EPR and heated humidification.  This will hopefully allow her to also sleep on her side and not rely on supine sleep only.    As to her restless leg concern I will start the patient on a very low-dose of gabapentin and see if this will help her to get more quality sleep.  She described creepy sensations or creepy crawly sensations at night sometimes also a burning sensation and she has lost the sense of vibration in both feet.  She also clearly has brisk reflexes so I have wondered if there is still  some degenerative disc disease or spinal stenosis present on top of a neuropathy.    INTERPRETING PHYSICIAN:   Melvyn Novas, MD   Medical Director of [facility] at [facility].

## 2020-10-23 NOTE — Progress Notes (Signed)
This HST confirms the presence of mild obstructive sleep apnea with an AHI of 11.3/h.  This apnea however is REM sleep dependent and positional dependent.   The attached hypnogram shows that the patient had several arousals during the last third of her sleep time. Sleep did not appear severely fragmented.  RECOMMENDATION: Mrs. Donna [name] should avoid sleeping on her back.  Her apnea is mild and does not cause significant hypoxia yet it is strongly REM sleep dependent and supine dependent.  I would recommend a trial of CPAP with a nasal pillow, and a setting between 5 and 12 cmH2O to centimeter EPR and heated humidification.  This will hopefully allow her to also sleep on her side.

## 2020-10-23 NOTE — Progress Notes (Signed)
IMPRESSION:  This HST confirms the presence of mild obstructive sleep apnea with an AHI of 11.3/h.  This apnea however is REM sleep dependent and positional dependent.   The attached hypnogram shows that the patient had several arousals during the last third of her sleep time. Sleep did not appear severely fragmented.  RECOMMENDATION: Mrs. Donna [name] should avoid sleeping on her back.  Her apnea is mild and does not cause significant hypoxia yet it is strongly REM sleep dependent and supine dependent.  I would recommend a trial of CPAP with a nasal pillow, and a setting between 5 and 12 cmH2O to centimeter EPR and heated humidification.  This will hopefully allow her to also sleep on her side.   Rv with Np in our sleep clinic.

## 2020-10-24 ENCOUNTER — Telehealth: Payer: Self-pay | Admitting: Neurology

## 2020-10-24 NOTE — Telephone Encounter (Signed)
Called the patient and discussed the SSR with her. Advised of the findings. Informed her that overall the apnea was mild. Reviewed the recommendations by Dr. Vickey Huger and at this time the patient declined CPAP. She would like to try the different position changes and see if notes benefit through that as well as continue the gabapentin. Pt will follow up jan 5 at 9:30 am

## 2020-10-24 NOTE — Telephone Encounter (Signed)
-----   Message from Melvyn Novas, MD sent at 10/23/2020  8:11 PM EDT -----  This HST confirms the presence of mild obstructive sleep apnea with an AHI of 11.3/h.  This apnea however is REM sleep dependent and positional dependent.   The attached hypnogram shows that the patient had several arousals during the last third of her sleep time. Sleep did not appear severely fragmented.  RECOMMENDATION: Donna [name] should avoid sleeping on her back.  Her apnea is mild and does not cause significant hypoxia yet it is strongly REM sleep dependent and supine dependent.  I would recommend a trial of CPAP with a nasal pillow, and a setting between 5 and 12 cmH2O to centimeter EPR and heated humidification.  This will hopefully allow her to also sleep on her side.

## 2020-10-25 ENCOUNTER — Encounter: Payer: Self-pay | Admitting: Physical Therapy

## 2020-10-25 ENCOUNTER — Other Ambulatory Visit: Payer: Self-pay

## 2020-10-25 ENCOUNTER — Ambulatory Visit: Payer: Medicare PPO | Admitting: Physical Therapy

## 2020-10-25 DIAGNOSIS — R2681 Unsteadiness on feet: Secondary | ICD-10-CM | POA: Diagnosis not present

## 2020-10-25 NOTE — Therapy (Signed)
[facility] @ [facility] [address] [place], [place], [address] Phone: [phone]   Fax:  [phone]  Physical Therapy Treatment  Patient Details  Name: Donna [name] MRN: [id number] Date of Birth: 03/18/1927 Referring Provider (PT): Abbey Chatters, NP   Encounter Date: 10/25/2020   PT End of Session - 10/25/20 1141     Visit Number 8    Number of Visits 12    Date for PT Re-Evaluation 11/03/20    Authorization Type [employer]    Authorization Time Period 09/22/20 to 11/03/20    Authorization - Visit Number 8    Authorization - Number of Visits 12    Progress Note Due on Visit 10    PT Start Time 1017    PT Stop Time 1058    PT Time Calculation (min) 41 min    Activity Tolerance No increased pain;Patient tolerated treatment well    Behavior During Therapy WFL for tasks assessed/performed             Past Medical History:  Diagnosis Date   Aortic stenosis    Hemorrhage of gastrointestinal tract, unspecified    Hypertension    Migraine    Mitral regurgitation    OA (osteoarthritis)    Thyroid cyst     Past Surgical History:  Procedure Laterality Date   CATARACT EXTRACTION  1990   rt   CATARACT EXTRACTION  2005   left   LEFT HEART CATH AND CORONARY ANGIOGRAPHY N/A 10/06/2019   Procedure: LEFT HEART CATH AND CORONARY ANGIOGRAPHY;  Surgeon: Elder Negus, MD;  [facility]: [facility];  Service: Cardiovascular;  Laterality: N/A;   REVISION TOTAL KNEE ARTHROPLASTY  2008   SVT ABLATION N/A 01/18/2020   Procedure: SVT ABLATION;  Surgeon: Marinus Maw, MD;  Location: [facility];  Service: Cardiovascular;  Laterality: N/A;   THUMB ARTHROSCOPY     TONSILLECTOMY      There were no vitals filed for this visit.   Subjective Assessment - 10/25/20 1021     Subjective Pt states that things are going ok, but her back is bothering her this morning. She didn't have any issues following last session.     Pertinent History HTN, migraines, Rt TKA 2008    Limitations Walking;House hold activities    How long can you stand comfortably? sometimes unable to complete fixing a meal without needing intermittent breaks    Patient Stated Goals improve balance    Currently in Pain? No/denies                               [facility] Adult PT Treatment/Exercise - 10/25/20 0001       Knee/Hip Exercises: Seated   Long Arc Quad Strengthening;Both;2 sets;10 reps    Long Arc Quad Weight 5 lbs.    Marching Strengthening;Both;2 sets;10 reps    Marching Limitations 2.5    Hamstring Curl Both;Strengthening;1 set;10 reps    Hamstring Limitations red TB      Modalities   Modalities Moist Heat      Moist Heat Therapy   Number Minutes Moist Heat --   during seated exercises   Moist Heat Location Lumbar Spine                 Balance Exercises - 10/25/20 0001       Balance Exercises: Standing   Standing Eyes Opened Foam/compliant  surface;Head turns   2x10 reps CGA   Standing Eyes Closed Foam/compliant surface;Narrow base of support (BOS);10 secs;3 reps   Eyes closed   Other Standing Exercises 6" step tap x5 reps, each with 2 HHA, 1 HHA, 2 finger, no UE    Other Standing Exercises Comments floor ladder down/back one foot each, weaving Lt/Rt CGA;balance stepping reactions to 4 colors 2x15 reps CGA                PT Education - 10/25/20 1059     Education Details technique with therex              PT Short Term Goals - 10/10/20 1208       PT SHORT TERM GOAL #1   Title Pt will be independent with her initial HEP to improve strength and balance.    Time 3    Period Weeks    Status Achieved               PT Long Term Goals - 10/18/20 1248       PT LONG TERM GOAL #2   Title Pt will be able to complete 5x sit to stand in less 14 sec without the need for UE support on her thighs, to reflect improvements in LE strength and functional stability.    Baseline  13 sec    Status Achieved      PT LONG TERM GOAL #5   Title Pt will complete TUG with LRAD in less than 14 sec to reflect improvements in her mobility and safety with short walking distances.    Baseline 15 sec without AD    Status On-going                   Plan - 10/25/20 1142     Clinical Impression Statement Pt had some low back soreness upon arrival, so PT monitored this during balance activity and other exercise. Pt was able to complete dual task activity with stepping and cone reaching at varied surfaces with only CGA. Pt has the most difficulty with head turns on uneven surface and had multiple LOB with PT MinA during this. PT applied moist heat to pt's low back during seated portion of LE strengthening exercises end of session and she reported improvement in her pain following this.    PT Frequency 2x / week    PT Duration 6 weeks    PT Next Visit Plan continue gait/balance training-dual task, include ankle/hip reactions, continue to progress HEP and LE strength    PT Home Exercise Plan Access Code [email or web address]    Consulted and Agree with Plan of Care Patient             Patient will benefit from skilled therapeutic intervention in order to improve the following deficits and impairments:     Visit Diagnosis: Unsteadiness on feet     Problem List Patient Active Problem List   Diagnosis Date Noted   Mild obstructive sleep apnea 10/23/2020   Excessive daytime sleepiness 07/25/2020   RLS (restless legs syndrome) 07/25/2020   Intermittent palpitations 07/25/2020   Nonrheumatic aortic valve insufficiency    Bradycardia 11/13/2019   Mitral regurgitation and aortic stenosis 11/12/2019   Severe mitral regurgitation 11/12/2019   SVT (supraventricular tachycardia) (HCC) 11/12/2019   Exertional dyspnea 10/02/2019   Overweight (BMI 25.0-29.9) 10/14/2017   BMI 27.0-27.9,adult 10/14/2017   Balance problem 10/14/2017   Neuropathic pain 09/03/2016   Bilateral  nonexudative age-related macular degeneration  09/03/2016   Generalized osteoarthritis of multiple sites 05/07/2013   Hypothyroidism 05/07/2013   Cyst of thyroid 05/07/2013   Hyperhidrosis 05/07/2013   Essential hypertension, benign 05/07/2013   Cough 01/31/2012   11:44 AM,10/25/20 Donita Brooks PT, DPT [facility]  [phone]   [facility] @ [facility] [address] [place], [place], [address] Phone: [phone]   Fax:  [phone]  Name: Donna [name] MRN: [id number] Date of Birth: 26-Oct-1927

## 2020-10-27 ENCOUNTER — Encounter: Payer: Self-pay | Admitting: Physical Therapy

## 2020-10-27 ENCOUNTER — Ambulatory Visit: Payer: Medicare PPO | Admitting: Physical Therapy

## 2020-10-27 ENCOUNTER — Other Ambulatory Visit: Payer: Self-pay

## 2020-10-27 DIAGNOSIS — R2681 Unsteadiness on feet: Secondary | ICD-10-CM | POA: Diagnosis not present

## 2020-10-27 NOTE — Therapy (Signed)
[facility] @ [facility] [address] [place], [place], [address] Phone: [phone]   Fax:  [phone]  Physical Therapy Treatment  Patient Details  Name: Donna [name] MRN: [id number] Date of Birth: July 19, 1927 Referring Provider (PT): Abbey Chatters, NP   Encounter Date: 10/27/2020   PT End of Session - 10/27/20 1233     Visit Number 9    Number of Visits 12    Date for PT Re-Evaluation 11/03/20    Authorization Type [employer]    Authorization Time Period 09/22/20 to 11/03/20    Authorization - Visit Number 9    Authorization - Number of Visits 12    Progress Note Due on Visit 10    PT Start Time 1145    PT Stop Time 1227    PT Time Calculation (min) 42 min    Activity Tolerance No increased pain;Patient tolerated treatment well    Behavior During Therapy WFL for tasks assessed/performed             Past Medical History:  Diagnosis Date   Aortic stenosis    Hemorrhage of gastrointestinal tract, unspecified    Hypertension    Migraine    Mitral regurgitation    OA (osteoarthritis)    Thyroid cyst     Past Surgical History:  Procedure Laterality Date   CATARACT EXTRACTION  1990   rt   CATARACT EXTRACTION  2005   left   LEFT HEART CATH AND CORONARY ANGIOGRAPHY N/A 10/06/2019   Procedure: LEFT HEART CATH AND CORONARY ANGIOGRAPHY;  Surgeon: Elder Negus, MD;  Location: [facility];  Service: Cardiovascular;  Laterality: N/A;   REVISION TOTAL KNEE ARTHROPLASTY  2008   SVT ABLATION N/A 01/18/2020   Procedure: SVT ABLATION;  Surgeon: Marinus Maw, MD;  Location: [facility];  Service: Cardiovascular;  Laterality: N/A;   THUMB ARTHROSCOPY     TONSILLECTOMY      There were no vitals filed for this visit.   Subjective Assessment - 10/27/20 1151     Subjective Pt is doing well. Her legs are a little tired.    Pertinent History HTN, migraines, Rt TKA 2008    Limitations Walking;House hold  activities    How long can you stand comfortably? sometimes unable to complete fixing a meal without needing intermittent breaks    Patient Stated Goals improve balance    Currently in Pain? No/denies                               [facility] Adult PT Treatment/Exercise - 10/27/20 0001       Transfers   Five time sit to stand comments     Comments      Ambulation/Gait   Gait Comments      Standardized Balance Assessment   Standardized Balance Assessment Berg Balance Test;Timed Up and Go Test      Berg Balance Test   Sit to Stand Able to stand without using hands and stabilize independently    Standing Unsupported Able to stand safely 2 minutes    Sitting with Back Unsupported but Feet Supported on Floor or Stool Able to sit safely and securely 2 minutes    Stand to Sit Sits safely with minimal use of hands    Transfers Able to transfer safely, minor use of hands    Standing Unsupported with Eyes Closed Able to stand  10 seconds safely    Standing Ubsupported with Feet Together Able to place feet together independently and stand 1 minute safely    From Standing, Reach Forward with Outstretched Arm Can reach confidently >25 cm (10")    From Standing Position, Pick up Object from Floor Able to pick up shoe, needs supervision   slowly   From Standing Position, Turn to Look Behind Over each Shoulder Turn sideways only but maintains balance    Turn 360 Degrees Able to turn 360 degrees safely but slowly    Standing Unsupported, Alternately Place Feet on Step/Stool Able to stand independently and safely and complete 8 steps in 20 seconds    Standing Unsupported, One Foot in Front Able to plae foot ahead of the other independently and hold 30 seconds    Standing on One Leg Able to lift leg independently and hold 5-10 seconds   5 sec   Total Score 49      Knee/Hip Exercises: Standing   Knee Flexion Both;2 sets;Strengthening;10 reps    Knee Flexion Limitations #2 ankle  weights    Hip Abduction Stengthening;Both;2 sets;10 reps;Knee straight    Abduction Limitations #2 ankle weights    Hip Extension Right;Both;1 set;10 reps    Extension Limitations #2 ankle weights                 Balance Exercises - 10/27/20 0001       Balance Exercises: Standing   Standing Eyes Opened Narrow base of support (BOS)   trunk rotation holding #5 kettlebell x10 reps   Other Standing Exercises hurdle step over back: forward x5 reps each LE, sidestep over/back x10 reps                  PT Short Term Goals - 10/10/20 1208       PT SHORT TERM GOAL #1   Title Pt will be independent with her initial HEP to improve strength and balance.    Time 3    Period Weeks    Status Achieved               PT Long Term Goals - 10/27/20 1235       PT LONG TERM GOAL #1   Title Pt will have greater than 4/5 MMT of the LE which will improve her power and mobility throughout the day.    Time 6    Period Weeks    Status New      PT LONG TERM GOAL #2   Title Pt will be able to complete 5x sit to stand in less 14 sec without the need for UE support on her thighs, to reflect improvements in LE strength and functional stability.    Baseline 13 sec previously    Time 6    Period Weeks    Status New      PT LONG TERM GOAL #3   Title Pt will have atleast 6 point increase on her BERG score which will reflect a clinically significant improvement in her balance and decrease her risk of falls.    Baseline 7 point improvement    Time 6    Period Weeks    Status On-going      PT LONG TERM GOAL #4   Title Pt will be able to ambulate throughout the clinic without an AD and without the need for PT supervision during an entire session.    Time 6    Period Weeks  Status New      PT LONG TERM GOAL #5   Title Pt will complete TUG with LRAD in less than 14 sec to reflect improvements in her mobility and safety with short walking distances.    Time 6    Period Weeks     Status New                   Plan - 10/27/20 1234     Clinical Impression Statement Pt continues to make steady progress towards her goals. Pt's BERG balance test was repeated today. Her score has improved significantly by 7 points, placing her in the moderate fall risk category. Remainder of the pt's session focused on therex to increase LE strength. Pt was provided several rest breaks throughout the strength portion of her session. Will continue with current POC to further increase her balance and safety with daily activity.    PT Frequency 2x / week    PT Duration 6 weeks    PT Next Visit Plan 10th visit progress note; continue gait/balance training-dual task, include ankle/hip reactions, continue to progress HEP and LE strength    PT Home Exercise Plan Access Code [email or web address]    Consulted and Agree with Plan of Care Patient             Patient will benefit from skilled therapeutic intervention in order to improve the following deficits and impairments:     Visit Diagnosis: Unsteadiness on feet     Problem List Patient Active Problem List   Diagnosis Date Noted   Mild obstructive sleep apnea 10/23/2020   Excessive daytime sleepiness 07/25/2020   RLS (restless legs syndrome) 07/25/2020   Intermittent palpitations 07/25/2020   Nonrheumatic aortic valve insufficiency    Bradycardia 11/13/2019   Mitral regurgitation and aortic stenosis 11/12/2019   Severe mitral regurgitation 11/12/2019   SVT (supraventricular tachycardia) (HCC) 11/12/2019   Exertional dyspnea 10/02/2019   Overweight (BMI 25.0-29.9) 10/14/2017   BMI 27.0-27.9,adult 10/14/2017   Balance problem 10/14/2017   Neuropathic pain 09/03/2016   Bilateral nonexudative age-related macular degeneration 09/03/2016   Generalized osteoarthritis of multiple sites 05/07/2013   Hypothyroidism 05/07/2013   Cyst of thyroid 05/07/2013   Hyperhidrosis 05/07/2013   Essential hypertension, benign 05/07/2013   Cough  01/31/2012   12:37 PM,10/27/20 Donita Brooks PT, DPT [facility] [place]  [phone]   [facility] @ [facility] [address] [place], [place], [address] Phone: [phone]   Fax:  [phone]  Name: Donna [name] MRN: [id number] Date of Birth: 11/23/1927

## 2020-10-31 ENCOUNTER — Other Ambulatory Visit: Payer: Self-pay

## 2020-10-31 ENCOUNTER — Ambulatory Visit: Payer: Medicare PPO | Admitting: Physical Therapy

## 2020-10-31 ENCOUNTER — Encounter: Payer: Self-pay | Admitting: Physical Therapy

## 2020-10-31 DIAGNOSIS — R2681 Unsteadiness on feet: Secondary | ICD-10-CM | POA: Diagnosis not present

## 2020-10-31 NOTE — Therapy (Addendum)
[facility] @ [facility] [address] [place], [place], [address] Phone: [phone]   Fax:  [phone]  Physical Therapy Treatment  Patient Details  Name: Donna [name] MRN: [id number] Date of Birth: Dec 23, 1927 Referring Provider (PT): Abbey Chatters, NP  Progress Note Reporting Period 09/22/20 to 10/31/20  See note below for Objective Data and Assessment of Progress/Goals.      Encounter Date: 10/31/2020   PT End of Session - 10/31/20 1016     Visit Number 10    Number of Visits 12    Date for PT Re-Evaluation 11/03/20    Authorization Type [employer]    Authorization Time Period 09/22/20 to 11/03/20    Authorization - Visit Number 10    Authorization - Number of Visits 12    Progress Note Due on Visit 20    PT Start Time 1013    PT Stop Time 1053    PT Time Calculation (min) 40 min    Activity Tolerance No increased pain;Patient tolerated treatment well    Behavior During Therapy WFL for tasks assessed/performed             Past Medical History:  Diagnosis Date   Aortic stenosis    Hemorrhage of gastrointestinal tract, unspecified    Hypertension    Migraine    Mitral regurgitation    OA (osteoarthritis)    Thyroid cyst     Past Surgical History:  Procedure Laterality Date   CATARACT EXTRACTION  1990   rt   CATARACT EXTRACTION  2005   left   LEFT HEART CATH AND CORONARY ANGIOGRAPHY N/A 10/06/2019   Procedure: LEFT HEART CATH AND CORONARY ANGIOGRAPHY;  Surgeon: Elder Negus, MD;  [facility]: [facility];  Service: Cardiovascular;  Laterality: N/A;   REVISION TOTAL KNEE ARTHROPLASTY  2008   SVT ABLATION N/A 01/18/2020   Procedure: SVT ABLATION;  Surgeon: Marinus Maw, MD;  Location: [facility];  Service: Cardiovascular;  Laterality: N/A;   THUMB ARTHROSCOPY     TONSILLECTOMY      There were no vitals filed for this visit.   Subjective Assessment - 10/31/20 1019     Subjective Had  both flu & COVID shot Friday AM and had a lot of joint pain and fatigue over the weekend. Mostly resolved as of today, some lingering fatigue remains.    Pertinent History HTN, migraines, Rt TKA 2008    Limitations Walking;House hold activities    Currently in Pain? No/denies    Multiple Pain Sites No                [facility] PT Assessment - 10/31/20 0001       Strength   Right Hip Flexion 3+/5    Right Hip ABduction 3+/5    Left Hip Flexion 3+/5    Left Hip ABduction 3+/5    Right Knee Flexion 3+/5    Right Knee Extension 4-/5    Left Knee Flexion 3+/5    Left Knee Extension 4/5      Transfers   Five time sit to stand comments  16 sec    Comments 30 sec sit to stand: x11 reps                           [facility] Adult PT Treatment/Exercise - 10/31/20 0001       Standardized Balance Assessment   Standardized Balance Assessment  Berg Balance Test;Timed Up and Go Test      [employer] Test   Sit to Stand Able to stand without using hands and stabilize independently    Standing Unsupported Able to stand safely 2 minutes    Sitting with Back Unsupported but Feet Supported on Floor or Stool Able to sit safely and securely 2 minutes    Stand to Sit Sits safely with minimal use of hands    Transfers Able to transfer safely, minor use of hands    Standing Unsupported with Eyes Closed Able to stand 10 seconds safely    Standing Ubsupported with Feet Together Able to place feet together independently and stand 1 minute safely    From Standing, Reach Forward with Outstretched Arm Can reach confidently >25 cm (10")    From Standing Position, Pick up Object from Floor Able to pick up shoe, needs supervision   slowly   From Standing Position, Turn to Look Behind Over each Shoulder Turn sideways only but maintains balance    Turn 360 Degrees Able to turn 360 degrees safely but slowly    Standing Unsupported, Alternately Place Feet on Step/Stool Able to stand independently  and safely and complete 8 steps in 20 seconds    Standing Unsupported, One Foot in Front Able to plae foot ahead of the other independently and hold 30 seconds    Standing on One Leg Able to lift leg independently and hold 5-10 seconds   5 sec   Total Score 49      Lumbar Exercises: Aerobic   Nustep L2 x 6 min with PTA present to discuss status      Knee/Hip Exercises: Standing   Heel Raises Both;1 set;15 reps   2 finger support   Forward Step Up Both;1 set;10 reps;Hand Hold: 1;Step Height: 6"   VC to fully extend knee   Other Standing Knee Exercises 1 lap of gym no AD, then repeated holding 3# wt no AD: mild scissoring      Knee/Hip Exercises: Seated   Long Arc Quad Strengthening;Both;1 set;10 reps;Weights    Long Arc Quad Weight 3 lbs.   ball bt knees   Marching Strengthening;Both;2 sets;10 reps    Marching Limitations 3    Hamstring Curl Both;Strengthening;1 set;15 reps    Hamstring Limitations red TB    Sit to Sand without UE support;2 sets;5 reps   holding 5# KB                      PT Short Term Goals - 10/10/20 1208       PT SHORT TERM GOAL #1   Title Pt will be independent with her initial HEP to improve strength and balance.    Time 3    Period Weeks    Status Achieved               PT Long Term Goals - 10/31/20 1033       PT LONG TERM GOAL #1   Title Pt will have greater than 4/5 MMT of the LE which will improve her power and mobility throughout the day.    Time 6    Period Weeks    Status On-going      PT LONG TERM GOAL #2   Title Pt will be able to complete 5x sit to stand in less 14 sec without the need for UE support on her thighs, to reflect improvements in LE strength and functional stability.  Baseline 13 sec previously    Time 6    Period Weeks    Status On-going      PT LONG TERM GOAL #3   Title Pt will have atleast 6 point increase on her BERG score which will reflect a clinically significant improvement in her balance and  decrease her risk of falls.    Baseline 7 point improvement    Period Weeks    Status Achieved      PT LONG TERM GOAL #5   Title Pt will complete TUG with LRAD in less than 14 sec to reflect improvements in her mobility and safety with short walking distances.    Baseline 15 sec without AD    Period Weeks    Status On-going                   Plan - 10/31/20 1016     Clinical Impression Statement Pt was unable to exercise overe the weekend due to adverse reaction to both flu/COVID shot on last Friday. Today pt had some lingering fatigue but not terrible. Quad MMT improved since last test, sit to stand and other dynamic assessments same as last measurement. Pt increased her BERG score by 7 points, meeting initial goal. PT may consider making new goal if pt decides to continue with PT.    Personal Factors and Comorbidities Age;Time since onset of injury/illness/exacerbation;Fitness    Examination-Activity Limitations Locomotion Level;Stand    Examination-Participation Restrictions Cleaning;Community Activity    Stability/Clinical Decision Making Stable/Uncomplicated    Rehab Potential Good    PT Frequency 2x / week    PT Duration 6 weeks    PT Treatment/Interventions ADLs/Self Care Home Management;Therapeutic activities;Therapeutic exercise;Neuromuscular re-education;Manual techniques;Patient/family education;Gait training    PT Next Visit Plan ERO visit next session    PT Home Exercise Plan Access Code [email or web address]    Consulted and Agree with Plan of Care Patient             Patient will benefit from skilled therapeutic intervention in order to improve the following deficits and impairments:  Pain, Decreased coordination, Decreased mobility, Decreased strength, Decreased range of motion, Decreased endurance, Decreased activity tolerance, Decreased balance, Difficulty walking, Impaired flexibility  Visit Diagnosis: Unsteadiness on feet     Problem List Patient Active  Problem List   Diagnosis Date Noted   Mild obstructive sleep apnea 10/23/2020   Excessive daytime sleepiness 07/25/2020   RLS (restless legs syndrome) 07/25/2020   Intermittent palpitations 07/25/2020   Nonrheumatic aortic valve insufficiency    Bradycardia 11/13/2019   Mitral regurgitation and aortic stenosis 11/12/2019   Severe mitral regurgitation 11/12/2019   SVT (supraventricular tachycardia) (HCC) 11/12/2019   Exertional dyspnea 10/02/2019   Overweight (BMI 25.0-29.9) 10/14/2017   BMI 27.0-27.9,adult 10/14/2017   Balance problem 10/14/2017   Neuropathic pain 09/03/2016   Bilateral nonexudative age-related macular degeneration 09/03/2016   Generalized osteoarthritis of multiple sites 05/07/2013   Hypothyroidism 05/07/2013   Cyst of thyroid 05/07/2013   Hyperhidrosis 05/07/2013   Essential hypertension, benign 05/07/2013   Cough 01/31/2012    Alfredia Desanctis, PTA 10/31/2020, 10:55 AM  Morton Peters, PT 11/01/20 7:43 AM  [facility] @ [facility] [address] [place], [place], [address] Phone: [phone]   Fax:  [phone]  Name: Jammy Stlouis MRN: [id number] Date of Birth: [birth year]

## 2020-11-01 ENCOUNTER — Other Ambulatory Visit: Payer: Self-pay | Admitting: Nurse Practitioner

## 2020-11-01 ENCOUNTER — Other Ambulatory Visit: Payer: Self-pay | Admitting: *Deleted

## 2020-11-01 DIAGNOSIS — I10 Essential (primary) hypertension: Secondary | ICD-10-CM

## 2020-11-01 MED ORDER — AMLODIPINE BESYLATE 2.5 MG PO TABS: 2.5000 mg | ORAL_TABLET | Freq: Every day | ORAL | 1 refills | Status: DC

## 2020-11-01 NOTE — Telephone Encounter (Signed)
Patient requested refill

## 2020-11-02 ENCOUNTER — Ambulatory Visit: Payer: Medicare PPO | Admitting: Physical Therapy

## 2020-11-02 ENCOUNTER — Other Ambulatory Visit: Payer: Self-pay

## 2020-11-02 ENCOUNTER — Encounter: Payer: Self-pay | Admitting: Physical Therapy

## 2020-11-02 DIAGNOSIS — R2681 Unsteadiness on feet: Secondary | ICD-10-CM

## 2020-11-02 NOTE — Therapy (Signed)
[facility] @ [place], [place], [address] Phone: [phone]   Fax:  [phone]  Physical Therapy Treatment  Patient Details  Name: Donna [name] MRN: [id number] Date of Birth: Dec 07, 1927 Referring Provider (PT): Sherrie Mustache, NP   Encounter Date: 11/02/2020   PT End of Session - 11/02/20 1028     Visit Number 11    Number of Visits 12    Date for PT Re-Evaluation 12/14/20    Authorization Type [employer]    Authorization Time Period Requesting 2 x per week x 6 weeks    Authorization - Visit Number 11    Authorization - Number of Visits 12    Progress Note Due on Visit 20    PT Start Time 1018    PT Stop Time 1100    PT Time Calculation (min) 42 min    Equipment Utilized During Treatment Gait belt    Activity Tolerance No increased pain;Patient tolerated treatment well    Behavior During Therapy WFL for tasks assessed/performed             Past Medical History:  Diagnosis Date   Aortic stenosis    Hemorrhage of gastrointestinal tract, unspecified    Hypertension    Migraine    Mitral regurgitation    OA (osteoarthritis)    Thyroid cyst     Past Surgical History:  Procedure Laterality Date   CATARACT EXTRACTION  1990   rt   CATARACT EXTRACTION  2005   left   LEFT HEART CATH AND CORONARY ANGIOGRAPHY N/A 10/06/2019   Procedure: LEFT HEART CATH AND CORONARY ANGIOGRAPHY;  Surgeon: Nigel Mormon, MD;  Location: [facility];  Service: Cardiovascular;  Laterality: N/A;   REVISION TOTAL KNEE ARTHROPLASTY  2008   SVT ABLATION N/A 01/18/2020   Procedure: SVT ABLATION;  Surgeon: Evans Lance, MD;  Location: [facility];  Service: Cardiovascular;  Laterality: N/A;   THUMB ARTHROSCOPY     TONSILLECTOMY      There were no vitals filed for this visit.   Subjective Assessment - 11/02/20 1020     Subjective Patient states "I definitely feel an improvement.  I'm stronger." Sit  to stand is much improved and walking is better.  Patient states she feels more confident with going out into the community.    Pertinent History HTN, migraines, Rt TKA 2008    Limitations Walking;House hold activities    How long can you stand comfortably? sometimes unable to complete fixing a meal without needing intermittent breaks    Patient Stated Goals improve balance                [facility] PT Assessment - 11/02/20 0001       Assessment   Medical Diagnosis imabalance, chronic low back pain    Referring Provider (PT) Sherrie Mustache, NP    Onset Date/Surgical Date --   balance for the last 2-3 years   Next MD Visit --    Prior Therapy none      Strength   Right Hip Flexion 3+/5    Right Hip ABduction 3+/5    Left Hip Flexion 3+/5    Left Hip ABduction 3+/5    Right Knee Flexion 3+/5    Right Knee Extension 4-/5    Left Knee Flexion 3+/5    Left Knee Extension 4/5      Transfers   Five time sit to stand comments  11 sec      Ambulation/Gait   Gait Comments pt ambulating with SPC, stability improved, short step length, 1  LOB when nevigating turns      Timed Up and Go Test   TUG Normal TUG    Normal TUG (seconds) 11    TUG Comments minor loss of balance                           [facility] Adult PT Treatment/Exercise - 11/02/20 0001       Berg Balance Test   Standing Unsupported with Eyes Closed Able to stand 10 seconds safely    Standing Ubsupported with Feet Together Able to place feet together independently and stand 1 minute safely    From Standing, Reach Forward with Outstretched Arm Can reach confidently >25 cm (10")    From Standing Position, Pick up Object from Floor Able to pick up shoe, needs supervision   slowly   From Standing Position, Turn to Look Behind Over each Shoulder Turn sideways only but maintains balance    Turn 360 Degrees Able to turn 360 degrees safely but slowly    Standing Unsupported, Alternately Place Feet on Step/Stool  Able to stand independently and safely and complete 8 steps in 20 seconds    Standing Unsupported, One Foot in Front Able to plae foot ahead of the other independently and hold 30 seconds    Standing on One Leg Able to lift leg independently and hold 5-10 seconds   5 sec     Timed Up and Go Test   TUG Normal TUG    Normal TUG (seconds) 12   minor loss of balance     Exercises   Exercises Shoulder;Lumbar;Knee/Hip      Lumbar Exercises: Aerobic   Nustep L3 x 6 min with PTA present to discuss status      Knee/Hip Exercises: Standing   Hip Abduction Both;1 set;10 reps    Lateral Step Up Both;1 set;10 reps    Other Standing Knee Exercises 10 feet, 2 rounds with turns, no AD with cg assist      Knee/Hip Exercises: Seated   Long Arc Quad Strengthening;Both;1 set;10 reps;Weights;2 sets    Marching Strengthening;Both;2 sets;10 reps   5# Forensic scientist Exercises - 11/02/20 0001       Balance Exercises: Standing   Other Standing Exercises hurdle step over back: forward x5 reps each LE, sidestep over/back x10 reps                  PT Short Term Goals - 10/10/20 1208       PT SHORT TERM GOAL #1   Title Pt will be independent with her initial HEP to improve strength and balance.    Time 3    Period Weeks    Status Achieved               PT Long Term Goals - 11/02/20 1106       PT LONG TERM GOAL #1   Title Pt will have greater than 4/5 MMT of the LE which will improve her power and mobility throughout the day.    Status On-going      PT LONG TERM GOAL #2   Title Pt will be able to complete 5x sit to stand in less 14 sec without the need for UE support  on her thighs, to reflect improvements in LE strength and functional stability.    Baseline 11    Status Achieved      PT LONG TERM GOAL #3   Title Pt will have atleast 6 point increase on her BERG score which will reflect a clinically significant improvement in her balance and decrease her  risk of falls.    Baseline 7 point improvement    Status Achieved      PT LONG TERM GOAL #4   Title Pt will be able to ambulate throughout the clinic without an AD and without the need for PT supervision during an entire session.    Status On-going      PT LONG TERM GOAL #5   Title Pt will complete TUG with LRAD in less than 14 sec to reflect improvements in her mobility and safety with short walking distances.    Baseline 12 sec without AD    Status Achieved                   Plan - 11/02/20 1030     Clinical Impression Statement Patient is making great progress for LTG.  She met her goals for TUG and sit to stand.  She continues to demonstrate hip weakness and loss of balance with 180 degree turns.  She continues to use her cane outside of the home.  She would benefit from continued skilled therapy for LE strengthening, balance and gait training to address her hip weakness and gait instability.    Personal Factors and Comorbidities Age;Time since onset of injury/illness/exacerbation;Fitness    Examination-Activity Limitations Locomotion Level;Stand    Examination-Participation Restrictions Cleaning;Community Activity    Stability/Clinical Decision Making Stable/Uncomplicated    Rehab Potential Good    PT Frequency 2x / week    PT Duration 6 weeks    PT Treatment/Interventions ADLs/Self Care Home Management;Therapeutic activities;Therapeutic exercise;Neuromuscular re-education;Manual techniques;Patient/family education;Gait training    PT Next Visit Plan Progress LE and functional strengthening, balance    PT Home Exercise Plan Access Code [email or web address]    Consulted and Agree with Plan of Care Patient             Patient will benefit from skilled therapeutic intervention in order to improve the following deficits and impairments:  Pain, Decreased coordination, Decreased mobility, Decreased strength, Decreased range of motion, Decreased endurance, Decreased activity tolerance,  Decreased balance, Difficulty walking, Impaired flexibility  Visit Diagnosis: Unsteadiness on feet - Plan: PT plan of care cert/re-cert     Problem List Patient Active Problem List   Diagnosis Date Noted   Mild obstructive sleep apnea 10/23/2020   Excessive daytime sleepiness 07/25/2020   RLS (restless legs syndrome) 07/25/2020   Intermittent palpitations 07/25/2020   Nonrheumatic aortic valve insufficiency    Bradycardia 11/13/2019   Mitral regurgitation and aortic stenosis 11/12/2019   Severe mitral regurgitation 11/12/2019   SVT (supraventricular tachycardia) ([facility]) 11/12/2019   Exertional dyspnea 10/02/2019   Overweight (BMI 25.0-29.9) 10/14/2017   BMI 27.0-27.9,adult 10/14/2017   Balance problem 10/14/2017   Neuropathic pain 09/03/2016   Bilateral nonexudative age-related macular degeneration 09/03/2016   Generalized osteoarthritis of multiple sites 05/07/2013   Hypothyroidism 05/07/2013   Cyst of thyroid 05/07/2013   Hyperhidrosis 05/07/2013   Essential hypertension, benign 05/07/2013   Cough 01/31/2012    Perri Aragones B. Colleen Donahoe, PT 10/19/221:13 PM   [facility] @ [place], [place], [address] Phone: [phone]   Fax:  [phone]  Name: Corianne Buccellato MRN: [id number] Date of Birth: 08/25/1927

## 2020-11-04 ENCOUNTER — Other Ambulatory Visit: Payer: Self-pay | Admitting: Orthopedic Surgery

## 2020-11-04 DIAGNOSIS — E038 Other specified hypothyroidism: Secondary | ICD-10-CM

## 2020-11-04 DIAGNOSIS — E041 Nontoxic single thyroid nodule: Secondary | ICD-10-CM

## 2020-11-07 ENCOUNTER — Ambulatory Visit: Payer: Medicare PPO | Admitting: Rehabilitative and Restorative Service Providers"

## 2020-11-07 ENCOUNTER — Encounter: Payer: Self-pay | Admitting: Rehabilitative and Restorative Service Providers"

## 2020-11-07 ENCOUNTER — Other Ambulatory Visit: Payer: Self-pay

## 2020-11-07 DIAGNOSIS — M6281 Muscle weakness (generalized): Secondary | ICD-10-CM

## 2020-11-07 DIAGNOSIS — R2681 Unsteadiness on feet: Secondary | ICD-10-CM | POA: Diagnosis not present

## 2020-11-07 NOTE — Therapy (Signed)
[facility] @ [facility] [address] [place], [place], [address] Phone: [phone]   Fax:  [phone]  Physical Therapy Treatment  Patient Details  Name: Donna [name] MRN: [id number] Date of Birth: Apr 03, 1927 Referring Provider (PT): Abbey Chatters, NP   Encounter Date: 11/07/2020   PT End of Session - 11/07/20 1020     Visit Number 12    Number of Visits 24    Date for PT Re-Evaluation 12/14/20    Authorization Type Hum[employer]    Authorization Time Period [employer] approved 12 visits 11/02/20-12/14/20    Authorization - Visit Number 2    Authorization - Number of Visits 12    Progress Note Due on Visit 20    PT Start Time 1015    PT Stop Time 1055    PT Time Calculation (min) 40 min    Activity Tolerance No increased pain;Patient tolerated treatment well    Behavior During Therapy WFL for tasks assessed/performed             Past Medical History:  Diagnosis Date   Aortic stenosis    Hemorrhage of gastrointestinal tract, unspecified    Hypertension    Migraine    Mitral regurgitation    OA (osteoarthritis)    Thyroid cyst     Past Surgical History:  Procedure Laterality Date   CATARACT EXTRACTION  1990   rt   CATARACT EXTRACTION  2005   left   LEFT HEART CATH AND CORONARY ANGIOGRAPHY N/A 10/06/2019   Procedure: LEFT HEART CATH AND CORONARY ANGIOGRAPHY;  Surgeon: Elder Negus, MD;  Location: [facility];  Service: Cardiovascular;  Laterality: N/A;   REVISION TOTAL KNEE ARTHROPLASTY  2008   SVT ABLATION N/A 01/18/2020   Procedure: SVT ABLATION;  Surgeon: Marinus Maw, MD;  Location: [facility];  Service: Cardiovascular;  Laterality: N/A;   THUMB ARTHROSCOPY     TONSILLECTOMY      There were no vitals filed for this visit.   Subjective Assessment - 11/07/20 1019     Subjective Pt reports no falls and no back pain today.    Patient Stated Goals improve balance    Currently in Pain?  No/denies                               [facility] Adult PT Treatment/Exercise - 11/07/20 0001       Ambulation/Gait   Gait Comments Ambulation down hallway in PT gym x2 laps without assistive device.      High Level Balance   High Level Balance Activities Side stepping;Tandem walking;Backward walking   x2 laps each in PT gym     Lumbar Exercises: Aerobic   Nustep L3 x 6 min with PT present to discuss status      Knee/Hip Exercises: Seated   Long Arc Quad Strengthening;Both;1 set;10 reps;Weights;2 sets    [place] Weight 3 lbs.    Clamshell with TheraBand Red   2x10   Marching Strengthening;Both;2 sets;10 reps    Marching Limitations 3#    Hamstring Curl Strengthening;Both;1 set;10 reps    Hamstring Limitations red tband    Sit to Sand 2 sets;10 reps   holding 5# kettlebell                      PT Short Term Goals - 10/10/20 1208  PT SHORT TERM GOAL #1   Title Pt will be independent with her initial HEP to improve strength and balance.    Time 3    Period Weeks    Status Achieved               PT Long Term Goals - 11/07/20 1114       PT LONG TERM GOAL #1   Title Pt will have greater than 4/5 MMT of the LE which will improve her power and mobility throughout the day.    Status On-going                   Plan - 11/07/20 1108     Clinical Impression Statement Pt continues to make progress towards goal related activities.  She had increased difficulty with seated HS curls and unable to achieve full ROM with exercise. Pt requires UE support with tandem, side stepping, and retrogait throughout session. During ambulation without assistive device down hallway, pt had many instances of instability where she grabbed onto the wall for improved safety/stability.    PT Treatment/Interventions ADLs/Self Care Home Management;Therapeutic activities;Therapeutic exercise;Neuromuscular re-education;Manual techniques;Patient/family  education;Gait training    PT Next Visit Plan Progress LE and functional strengthening, balance    Consulted and Agree with Plan of Care Patient             Patient will benefit from skilled therapeutic intervention in order to improve the following deficits and impairments:  Pain, Decreased coordination, Decreased mobility, Decreased strength, Decreased range of motion, Decreased endurance, Decreased activity tolerance, Decreased balance, Difficulty walking, Impaired flexibility  Visit Diagnosis: Unsteadiness on feet  Muscle weakness (generalized)     Problem List Patient Active Problem List   Diagnosis Date Noted   Mild obstructive sleep apnea 10/23/2020   Excessive daytime sleepiness 07/25/2020   RLS (restless legs syndrome) 07/25/2020   Intermittent palpitations 07/25/2020   Nonrheumatic aortic valve insufficiency    Bradycardia 11/13/2019   Mitral regurgitation and aortic stenosis 11/12/2019   Severe mitral regurgitation 11/12/2019   SVT (supraventricular tachycardia) ([facility]) 11/12/2019   Exertional dyspnea 10/02/2019   Overweight (BMI 25.0-29.9) 10/14/2017   BMI 27.0-27.9,adult 10/14/2017   Balance problem 10/14/2017   Neuropathic pain 09/03/2016   Bilateral nonexudative age-related macular degeneration 09/03/2016   Generalized osteoarthritis of multiple sites 05/07/2013   Hypothyroidism 05/07/2013   Cyst of thyroid 05/07/2013   Hyperhidrosis 05/07/2013   Essential hypertension, benign 05/07/2013   Cough 01/31/2012    Reather Laurence, PT, DPT 11/07/2020, 11:16 AM  [facility] @ [facility] [address] [place], [place], [address] Phone: [phone]   Fax:  [phone]  Name: Donna [name] MRN: [id number] Date of Birth: 09-08-1927

## 2020-11-09 ENCOUNTER — Ambulatory Visit: Payer: Medicare PPO | Admitting: Physical Therapy

## 2020-11-09 ENCOUNTER — Other Ambulatory Visit: Payer: Self-pay

## 2020-11-09 ENCOUNTER — Encounter: Payer: Self-pay | Admitting: Physical Therapy

## 2020-11-09 DIAGNOSIS — R2681 Unsteadiness on feet: Secondary | ICD-10-CM | POA: Diagnosis not present

## 2020-11-09 DIAGNOSIS — M6281 Muscle weakness (generalized): Secondary | ICD-10-CM

## 2020-11-09 NOTE — Therapy (Signed)
[facility] @ [facility] [address] [place], [place], [address] Phone: [phone]   Fax:  [phone]  Physical Therapy Treatment  Patient Details  Name: Donna [name] MRN: [id number] Date of Birth: 07-12-1927 Referring Provider (PT): Abbey Chatters, NP   Encounter Date: 11/09/2020   PT End of Session - 11/09/20 1229     Visit Number 13    Number of Visits 24    Date for PT Re-Evaluation 12/14/20    Authorization Type [employer]    Authorization Time Period [employer] approved 12 visits 11/02/20-12/14/20    Authorization - Visit Number 3    Authorization - Number of Visits 12    Progress Note Due on Visit 20    PT Start Time 1230    PT Stop Time 1313    PT Time Calculation (min) 43 min    Equipment Utilized During Treatment Gait belt    Activity Tolerance No increased pain;Patient tolerated treatment well    Behavior During Therapy WFL for tasks assessed/performed             Past Medical History:  Diagnosis Date   Aortic stenosis    Hemorrhage of gastrointestinal tract, unspecified    Hypertension    Migraine    Mitral regurgitation    OA (osteoarthritis)    Thyroid cyst     Past Surgical History:  Procedure Laterality Date   CATARACT EXTRACTION  1990   rt   CATARACT EXTRACTION  2005   left   LEFT HEART CATH AND CORONARY ANGIOGRAPHY N/A 10/06/2019   Procedure: LEFT HEART CATH AND CORONARY ANGIOGRAPHY;  Surgeon: Elder Negus, MD;  [facility]: [facility];  Service: Cardiovascular;  Laterality: N/A;   REVISION TOTAL KNEE ARTHROPLASTY  2008   SVT ABLATION N/A 01/18/2020   Procedure: SVT ABLATION;  Surgeon: Marinus Maw, MD;  [facility]: [facility];  Service: Cardiovascular;  Laterality: N/A;   THUMB ARTHROSCOPY     TONSILLECTOMY      There were no vitals filed for this visit.   Subjective Assessment - 11/09/20 1228     Subjective We did some new exercises with bands last time and my legs were  unreliable for a few hours afterwards.  No back pain today.    Pertinent History HTN, migraines, Rt TKA 2008    Limitations Walking;House hold activities    How long can you stand comfortably? sometimes unable to complete fixing a meal without needing intermittent breaks    Patient Stated Goals improve balance    Currently in Pain? No/denies    Pain Frequency Intermittent    Aggravating Factors  standing, housework, balance difficulty with community navigation    Pain Relieving Factors sitting                               [facility] Adult PT Treatment/Exercise - 11/09/20 0001       Ambulation/Gait   Gait Comments Ambulation down hallway in PT gym x2 laps without assistive device, close CGA and VC to turn 180 deg to change directions x 8 reps during laps      Exercises   Exercises Shoulder;Lumbar;Knee/Hip      Lumbar Exercises: Aerobic   Nustep L3 x 6 min with PT present to discuss status and plan for session      Knee/Hip Exercises: Standing   Lateral Step Up Left;Right;1 set;5 reps;Step Height: 6";Hand  Hold: 1      Knee/Hip Exercises: Seated   Long Arc Quad Strengthening;2 sets;10 reps;Weights    Long Arc Quad Weight 3 lbs.    Marching Strengthening;1 set;10 reps;Weights    Marching Limitations 5lb on thigh, VC to avoid trunk lean    Hamstring Curl Strengthening;Both;1 set;5 reps    Hamstring Limitations yellow loop    Sit to Sand 2 sets;10 reps   holding 5# kettlebell                Balance Exercises - 11/09/20 0001       Balance Exercises: Standing   Standing Eyes Closed Narrow base of support (BOS);2 reps;20 secs;Solid surface    Tandem Stance Eyes open;Intermittent upper extremity support;2 reps;20 secs;Foam/compliant surface    SLS with Vectors 2 reps;20 secs;Cognitive challenge   4 colored disc on floor with PT VC 1-3 colors at a time to step to   Rockerboard Anterior/posterior;UE support;EO   1'   Sidestepping 2 reps;Theraband;Upper  extremity support   10 steps each way   Other Standing Exercises cone weaving x 4 rounds                  PT Short Term Goals - 10/10/20 1208       PT SHORT TERM GOAL #1   Title Pt will be independent with her initial HEP to improve strength and balance.    Time 3    Period Weeks    Status Achieved               PT Long Term Goals - 11/07/20 1114       PT LONG TERM GOAL #1   Title Pt will have greater than 4/5 MMT of the LE which will improve her power and mobility throughout the day.    Status On-going                   Plan - 11/09/20 1323     Clinical Impression Statement Pt reported distrust in her legs after hamstring curls with red loop band x several hours after last session so decreased from red to yellow band and reduced reps to 5 from 10.  Pt with occassional LOB with gait without AD when she intermittently scissors Rt over Lt foot, but Pt only demo'd this 2x throughout session.  Pt with improving 180 deg turns and cone weaving stability today.  She was fatigued end of session but no onset of LBP.    PT Frequency 2x / week    PT Duration 6 weeks    PT Next Visit Plan Progress LE and functional strengthening, balance    PT Home Exercise Plan Access Code [email or web address]    Consulted and Agree with Plan of Care Patient             Patient will benefit from skilled therapeutic intervention in order to improve the following deficits and impairments:     Visit Diagnosis: Unsteadiness on feet  Muscle weakness (generalized)     Problem List Patient Active Problem List   Diagnosis Date Noted   Mild obstructive sleep apnea 10/23/2020   Excessive daytime sleepiness 07/25/2020   RLS (restless legs syndrome) 07/25/2020   Intermittent palpitations 07/25/2020   Nonrheumatic aortic valve insufficiency    Bradycardia 11/13/2019   Mitral regurgitation and aortic stenosis 11/12/2019   Severe mitral regurgitation 11/12/2019   SVT (supraventricular  tachycardia) ([facility]) 11/12/2019   Exertional dyspnea 10/02/2019   Overweight (BMI  25.0-29.9) 10/14/2017   BMI 27.0-27.9,adult 10/14/2017   Balance problem 10/14/2017   Neuropathic pain 09/03/2016   Bilateral nonexudative age-related macular degeneration 09/03/2016   Generalized osteoarthritis of multiple sites 05/07/2013   Hypothyroidism 05/07/2013   Cyst of thyroid 05/07/2013   Hyperhidrosis 05/07/2013   Essential hypertension, benign 05/07/2013   Cough 01/31/2012    Yvanna Vidas, PT 11/09/20 1:27 PM   [facility] @ [facility] [address] [place], [place], [address] Phone: [phone]   Fax:  [phone]  Name: Donna [name] MRN: [id number] Date of Birth: 1927/10/15

## 2020-11-15 ENCOUNTER — Encounter: Payer: Self-pay | Admitting: Rehabilitative and Restorative Service Providers"

## 2020-11-15 ENCOUNTER — Other Ambulatory Visit: Payer: Self-pay

## 2020-11-15 ENCOUNTER — Ambulatory Visit: Payer: Medicare PPO | Attending: Nurse Practitioner | Admitting: Rehabilitative and Restorative Service Providers"

## 2020-11-15 DIAGNOSIS — R2681 Unsteadiness on feet: Secondary | ICD-10-CM | POA: Diagnosis present

## 2020-11-15 DIAGNOSIS — M6281 Muscle weakness (generalized): Secondary | ICD-10-CM | POA: Insufficient documentation

## 2020-11-15 NOTE — Therapy (Signed)
[facility] @ [facility] [address] [place], [place], [address] Phone: [phone]   Fax:  [phone]  Physical Therapy Treatment  Patient Details  Name: Donna [name] MRN: [id number] Date of Birth: 06-13-1927 Referring Provider (PT): Abbey Chatters, NP   Encounter Date: 11/15/2020   PT End of Session - 11/15/20 0934     Visit Number 14    Number of Visits 24    Date for PT Re-Evaluation 12/14/20    Authorization Type [employer]    Authorization Time Period [employer] approved 12 visits 11/02/20-12/14/20    Authorization - Visit Number 4    Authorization - Number of Visits 12    Progress Note Due on Visit 20    PT Start Time 0929    PT Stop Time 1010    PT Time Calculation (min) 41 min    Activity Tolerance No increased pain;Patient tolerated treatment well    Behavior During Therapy WFL for tasks assessed/performed             Past Medical History:  Diagnosis Date   Aortic stenosis    Hemorrhage of gastrointestinal tract, unspecified    Hypertension    Migraine    Mitral regurgitation    OA (osteoarthritis)    Thyroid cyst     Past Surgical History:  Procedure Laterality Date   CATARACT EXTRACTION  1990   rt   CATARACT EXTRACTION  2005   left   LEFT HEART CATH AND CORONARY ANGIOGRAPHY N/A 10/06/2019   Procedure: LEFT HEART CATH AND CORONARY ANGIOGRAPHY;  Surgeon: Elder Negus, MD;  Location: [facility];  Service: Cardiovascular;  Laterality: N/A;   REVISION TOTAL KNEE ARTHROPLASTY  2008   SVT ABLATION N/A 01/18/2020   Procedure: SVT ABLATION;  Surgeon: Marinus Maw, MD;  Location: [facility];  Service: Cardiovascular;  Laterality: N/A;   THUMB ARTHROSCOPY     TONSILLECTOMY      There were no vitals filed for this visit.   Subjective Assessment - 11/15/20 0933     Subjective My back is okay, but my legs are tired    Patient Stated Goals improve balance    Currently in Pain? No/denies                                [facility] Adult PT Treatment/Exercise - 11/15/20 0001       Ambulation/Gait   Gait Comments Amb down long hallway down and back x2 laps without assistive device and SBA.  Pt with occasional misstep requiring light CGA to assist with steadying patient.      High Level Balance   High Level Balance Activities Tandem walking   x2 laps down half of PT gym and back with CGA     Lumbar Exercises: Aerobic   Nustep L4 x 6 min with PT present to discuss status and plan for session   seat at 7     Knee/Hip Exercises: Standing   Lateral Step Up Both;1 set;5 reps;Hand Hold: 2;Step Height: 6"    Forward Step Up Both;1 set;10 reps;Hand Hold: 2;Step Height: 6"      Knee/Hip Exercises: Seated   Long Arc Quad Strengthening;2 sets;10 reps;Weights    Long Arc Quad Weight 3 lbs.    Clamshell with TheraBand Red   2x10   Marching Strengthening;1 set;10 reps;Weights    Marching Limitations 3#  Hamstring Curl Strengthening;Both;2 sets;10 reps    Hamstring Limitations red tband to pt tolerance for resistance    Sit to Sand 2 sets;10 reps   holding 5# kettlebell                      PT Short Term Goals - 10/10/20 1208       PT SHORT TERM GOAL #1   Title Pt will be independent with her initial HEP to improve strength and balance.    Time 3    Period Weeks    Status Achieved               PT Long Term Goals - 11/15/20 1026       PT LONG TERM GOAL #1   Title Pt will have greater than 4/5 MMT of the LE which will improve her power and mobility throughout the day.    Status On-going      PT LONG TERM GOAL #4   Title Pt will be able to ambulate throughout the clinic without an AD and without the need for PT supervision during an entire session.    Status On-going                   Plan - 11/15/20 1016     Clinical Impression Statement Pt is progressing with increased strength and improved balance. Pt reports that she  can tell that walking has gotten easier at home, and most often she does not use her cane inside her home, but always uses it for walking outside. Pt is progressing towards improved balance, but still with difficulty with tandem gait. Pt continues with weakness with hamstring curl and PT maintained looser grip on red theraband to allow pt to complete more complete motion. Pt tolerated session well.    PT Treatment/Interventions ADLs/Self Care Home Management;Therapeutic activities;Therapeutic exercise;Neuromuscular re-education;Manual techniques;Patient/family education;Gait training    PT Next Visit Plan Progress LE and functional strengthening, balance    Consulted and Agree with Plan of Care Patient             Patient will benefit from skilled therapeutic intervention in order to improve the following deficits and impairments:  Pain, Decreased coordination, Decreased mobility, Decreased strength, Decreased range of motion, Decreased endurance, Decreased activity tolerance, Decreased balance, Difficulty walking, Impaired flexibility  Visit Diagnosis: Unsteadiness on feet  Muscle weakness (generalized)     Problem List Patient Active Problem List   Diagnosis Date Noted   Mild obstructive sleep apnea 10/23/2020   Excessive daytime sleepiness 07/25/2020   RLS (restless legs syndrome) 07/25/2020   Intermittent palpitations 07/25/2020   Nonrheumatic aortic valve insufficiency    Bradycardia 11/13/2019   Mitral regurgitation and aortic stenosis 11/12/2019   Severe mitral regurgitation 11/12/2019   SVT (supraventricular tachycardia) ([place]) 11/12/2019   Exertional dyspnea 10/02/2019   Overweight (BMI 25.0-29.9) 10/14/2017   BMI 27.0-27.9,adult 10/14/2017   Balance problem 10/14/2017   Neuropathic pain 09/03/2016   Bilateral nonexudative age-related macular degeneration 09/03/2016   Generalized osteoarthritis of multiple sites 05/07/2013   Hypothyroidism 05/07/2013   Cyst of thyroid  05/07/2013   Hyperhidrosis 05/07/2013   Essential hypertension, benign 05/07/2013   Cough 01/31/2012    Juel Burrow, PT, DPT 11/15/2020, 10:27 AM  [facility] @ [facility] [place], [place], [address] Phone: [phone]   Fax:  [phone]  Name: Donna [name] MRN: [id number] Date of Birth: 1927-01-29

## 2020-11-17 ENCOUNTER — Other Ambulatory Visit: Payer: Self-pay | Admitting: Cardiology

## 2020-11-17 ENCOUNTER — Encounter: Payer: Self-pay | Admitting: Physical Therapy

## 2020-11-17 ENCOUNTER — Ambulatory Visit: Payer: Medicare PPO | Admitting: Physical Therapy

## 2020-11-17 ENCOUNTER — Other Ambulatory Visit: Payer: Self-pay

## 2020-11-17 ENCOUNTER — Encounter: Payer: Self-pay | Admitting: Cardiology

## 2020-11-17 ENCOUNTER — Ambulatory Visit: Payer: Medicare PPO | Admitting: Cardiology

## 2020-11-17 VITALS — BP 146/61 | HR 60 | Resp 16 | Ht 63.0 in | Wt 150.2 lb

## 2020-11-17 DIAGNOSIS — R2681 Unsteadiness on feet: Secondary | ICD-10-CM

## 2020-11-17 DIAGNOSIS — Z87891 Personal history of nicotine dependence: Secondary | ICD-10-CM

## 2020-11-17 DIAGNOSIS — I10 Essential (primary) hypertension: Secondary | ICD-10-CM

## 2020-11-17 DIAGNOSIS — R0609 Other forms of dyspnea: Secondary | ICD-10-CM

## 2020-11-17 DIAGNOSIS — I350 Nonrheumatic aortic (valve) stenosis: Secondary | ICD-10-CM

## 2020-11-17 DIAGNOSIS — M6281 Muscle weakness (generalized): Secondary | ICD-10-CM

## 2020-11-17 DIAGNOSIS — I471 Supraventricular tachycardia: Secondary | ICD-10-CM

## 2020-11-17 MED ORDER — METOPROLOL SUCCINATE ER 25 MG PO TB24: 25.0000 mg | ORAL_TABLET | Freq: Every morning | ORAL | 0 refills | Status: DC

## 2020-11-17 MED ORDER — HYDRALAZINE HCL 10 MG PO TABS: 10.0000 mg | ORAL_TABLET | Freq: Three times a day (TID) | ORAL | 0 refills | Status: DC

## 2020-11-17 NOTE — Therapy (Signed)
[facility] @ [facility] [address] [place], [place], [address] Phone: [phone]   Fax:  [phone]  Physical Therapy Treatment  Patient Details  Name: Donna [name] MRN: [id number] Date of Birth: 1927-02-09 Referring Provider (PT): Abbey Chatters, NP   Encounter Date: 11/17/2020   PT End of Session - 11/17/20 1231     Visit Number 15    Number of Visits 24    Date for PT Re-Evaluation 12/14/20    Authorization Time Period [employer] approved 12 visits 11/02/20-12/14/20    Authorization - Visit Number 5    Authorization - Number of Visits 12    Progress Note Due on Visit 20    PT Start Time 1230    PT Stop Time 1313    PT Time Calculation (min) 43 min    Activity Tolerance No increased pain;Patient tolerated treatment well    Behavior During Therapy WFL for tasks assessed/performed             Past Medical History:  Diagnosis Date   Aortic stenosis    Hemorrhage of gastrointestinal tract, unspecified    Hypertension    Migraine    Mitral regurgitation    OA (osteoarthritis)    Thyroid cyst     Past Surgical History:  Procedure Laterality Date   CATARACT EXTRACTION  1990   rt   CATARACT EXTRACTION  2005   left   LEFT HEART CATH AND CORONARY ANGIOGRAPHY N/A 10/06/2019   Procedure: LEFT HEART CATH AND CORONARY ANGIOGRAPHY;  Surgeon: Elder Negus, MD;  [facility]: [facility];  Service: Cardiovascular;  Laterality: N/A;   REVISION TOTAL KNEE ARTHROPLASTY  2008   SVT ABLATION N/A 01/18/2020   Procedure: SVT ABLATION;  Surgeon: Marinus Maw, MD;  Location: [facility];  Service: Cardiovascular;  Laterality: N/A;   THUMB ARTHROSCOPY     TONSILLECTOMY      There were no vitals filed for this visit.   Subjective Assessment - 11/17/20 1230     Subjective I am doing so much better than when I started but I'm not there yet.  I need to keep working on my walking.    Pertinent History HTN, migraines,  Rt TKA 2008    Limitations Walking;House hold activities    How long can you stand comfortably? sometimes unable to complete fixing a meal without needing intermittent breaks    Patient Stated Goals improve balance    Currently in Pain? No/denies                               [facility] Adult PT Treatment/Exercise - 11/17/20 0001       Neuro Re-ed    Neuro Re-ed Details  clock LE taps to discs lateral and anterolateral 4 rounds, step lunge to discs lateral and anterolateral x 4 rounds, close supervision      Exercises   Exercises Shoulder;Lumbar;Knee/Hip      Lumbar Exercises: Aerobic   Nustep L4 x 6 min with PT present to discuss status and plan for session   seat at 7     Lumbar Exercises: Seated   Other Seated Lumbar Exercises blue plyo ball hip to hip x 10, hip to shoulder x 5 each    Other Seated Lumbar Exercises blue plyo ball chair sit up x 6      Knee/Hip Exercises: Stretches   Other Knee/Hip Stretches  supine butterfly 3x10 sec    Other Knee/Hip Stretches windshield wipers, small range x 10      Knee/Hip Exercises: Standing   Lateral Step Up Both;10 reps;Hand Hold: 2    Lateral Step Up Limitations up and over blue foam mat with light UE support    Forward Step Up Both;1 set;10 reps;Hand Hold: 2;Step Height: 6"   march tap to 2nd step   Functional Squat 5 reps;1 set    Functional Squat Limitations focus on eccentric control for sit to stands    Rocker Board 2 minutes    Rocker Board Limitations light UE support    Gait Training 3x200' close supervision, pre and post disc stepping strategy, PT noted no LOB 2nd gait set      Knee/Hip Exercises: Seated   Sit to Sand 1 set;10 reps;without UE support   hold 5lb                Balance Exercises - 11/17/20 0001       Balance Exercises: Standing   Other Standing Exercises disc weaving figure 8s x 4, box stepping with discs spread 5 steps apart x 2 ways each                  PT Short Term  Goals - 10/10/20 1208       PT SHORT TERM GOAL #1   Title Pt will be independent with her initial HEP to improve strength and balance.    Time 3    Period Weeks    Status Achieved               PT Long Term Goals - 11/15/20 1026       PT LONG TERM GOAL #1   Title Pt will have greater than 4/5 MMT of the LE which will improve her power and mobility throughout the day.    Status On-going      PT LONG TERM GOAL #4   Title Pt will be able to ambulate throughout the clinic without an AD and without the need for PT supervision during an entire session.    Status On-going                   Plan - 11/17/20 1314     Clinical Impression Statement Pt was less fatigued in LEs today on arrival.  She was challenged by multi-directional stepping to discs, box stepping with discs 5 steps apart to work on multi-directional gait, and clock taps side to side and to front today.  She performed 3x200' gait today and demo'd no LOB or scissoring for 2nd two sets, which followed the stepping strategies which may have helped with her coordination and balance for gait with direct carry over . PT also trialed supine hip IR/ER and gentle butterfly stretch today which may have helped loosen up hips for less scissoring with gait.  Continue along POC.    PT Frequency 2x / week    PT Duration 6 weeks    PT Treatment/Interventions ADLs/Self Care Home Management;Therapeutic activities;Therapeutic exercise;Neuromuscular re-education;Manual techniques;Patient/family education;Gait training    PT Next Visit Plan Progress LE and functional strengthening, balance, continue balance disc challenges prior to gait    PT Home Exercise Plan Access Code [email or web address]    Consulted and Agree with Plan of Care Patient             Patient will benefit from skilled therapeutic intervention in order to improve the following deficits  and impairments:     Visit Diagnosis: Unsteadiness on feet  Muscle weakness  (generalized)     Problem List Patient Active Problem List   Diagnosis Date Noted   Mild obstructive sleep apnea 10/23/2020   Excessive daytime sleepiness 07/25/2020   RLS (restless legs syndrome) 07/25/2020   Intermittent palpitations 07/25/2020   Nonrheumatic aortic valve insufficiency    Bradycardia 11/13/2019   Mitral regurgitation and aortic stenosis 11/12/2019   Severe mitral regurgitation 11/12/2019   SVT (supraventricular tachycardia) ([place]) 11/12/2019   Exertional dyspnea 10/02/2019   Overweight (BMI 25.0-29.9) 10/14/2017   BMI 27.0-27.9,adult 10/14/2017   Balance problem 10/14/2017   Neuropathic pain 09/03/2016   Bilateral nonexudative age-related macular degeneration 09/03/2016   Generalized osteoarthritis of multiple sites 05/07/2013   Hypothyroidism 05/07/2013   Cyst of thyroid 05/07/2013   Hyperhidrosis 05/07/2013   Essential hypertension, benign 05/07/2013   Cough 01/31/2012    Milany Geck, PT 11/17/20 1:18 PM   [facility] @ [facility] [place], [place], [address] Phone: [phone]   Fax:  [phone]  Name: Zina Fernald MRN: [id number] Date of Birth: [birth year]

## 2020-11-17 NOTE — Progress Notes (Signed)
ID:  Donna [name], DOB January 26, 1927, MRN [id number]  PCP:  [name], NP  Cardiologist:  Rex Kras, DO, [facility] (established care 08/26/2019)   Date: 11/17/20 Last Office Visit: 08/24/2020  Chief Complaint  Patient presents with   Hypertension   supraventricular tachycardia   Follow-up    HPI  Donna [name] is a 87 y.o. female who presents to the office with a chief complaint of " 3 month follow up for BP and hx of SVT." Patient's past medical history and cardiovascular risk factors include: hypertension, mild OSA, supraventricular tachycardia, moderate aortic stenosis, mild MR, migraine, GI bleeding, postmenopausal female, advanced age.  Patient was referred to the office for evaluation of palpitations.  After an extensive cardiovascular work-up with the patient was noted to have SVT and was seen by Dr. Lovena Le from cardiac electrophysiology and underwent an EP study as well as radiofrequency ablation of her slow pathway.  Since then symptomatically the patient has improved.  During initial work-up she was also noted to have aortic stenosis per surface echocardiogram; however, angiography there is no significant gradient between the left ventricle and aorta on pullback.  Clinically she has remained stable and has not experienced any anginal discomfort, syncope, or heart failure symptoms.  At the last office visit uptitrated carvedilol to 12.5 mg p.o. twice daily.  However, due to the patient's pulse that she has been holding majority of these doses and therefore her blood pressure has been trending above baseline.  Reviewed her blood pressure log and SBP ranges between 209-470 mmHg and diastolic blood pressure ranges between 70-95 mmHg.  She was also referred to sleep medicine since last visit she was noted to have mild OSA during REM sleep and it is positionally dependent.  Currently not on CPAP.  Patient denies any chest pain or anginal discomfort.  Patient is concerned and would  like her blood pressure medications further titrated.  Overall euvolemic and not in congestive heart failure.  No hospitalizations or urgent care visits.  FUNCTIONAL STATUS: No structured exercise program or daily routine. 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    ALLERGIES: No Known Allergies  MEDICATION LIST PRIOR TO VISIT: Current Meds  Medication Sig   Blood Pressure Monitoring KIT Essential hypertension check bp daily   Calcium Carbonate-Vitamin D 600-400 MG-UNIT per tablet Take 1 tablet by mouth in the morning and at bedtime.   gabapentin (NEURONTIN) 250 MG/5ML solution Take 1 mL (50 mg total) by mouth at bedtime.   hydrALAZINE (APRESOLINE) 10 MG tablet Take 1 tablet (10 mg total) by mouth 3 (three) times daily.   levothyroxine (SYNTHROID) 100 MCG tablet TAKE 1 TABLET BY MOUTH EVERY DAY 30 MINUTES BEFORE BREAKFAST ON AN EMPTY STOMACH   Light Mineral Oil-Mineral Oil (RETAINE MGD) 0.5-0.5 % EMUL Place 1 drop into both eyes in the morning and at bedtime.   losartan (COZAAR) 100 MG tablet TAKE 1 TABLET(100 MG) BY MOUTH DAILY   metoprolol succinate (TOPROL XL) 25 MG 24 hr tablet Take 1 tablet (25 mg total) by mouth every morning. Hold if systolic blood pressure (top number) less than 100 mmHg or pulse less than 55 bpm.   Multiple Vitamins-Minerals (PRESERVISION AREDS 2) CAPS Take 1 capsule by mouth 2 (two) times a day.    OVER THE COUNTER MEDICATION Take 2 capsules by mouth daily. MitoQ   vitamin B-12 (CYANOCOBALAMIN) 1000 MCG tablet Take 1 tablet (1,000 mcg total) by mouth daily.   [  DISCONTINUED] amLODipine (NORVASC) 2.5 MG tablet Take 2.5 mg by mouth daily at 10 pm.   [DISCONTINUED] carvedilol (COREG) 12.5 MG tablet TAKE 1 TABLET(12.5 MG) BY MOUTH TWICE DAILY     PAST MEDICAL HISTORY: Past Medical History:  Diagnosis Date   Aortic stenosis    Hemorrhage of gastrointestinal tract, unspecified    Hypertension    Migraine    Mitral regurgitation    OA  (osteoarthritis)    Thyroid cyst     PAST SURGICAL HISTORY: Past Surgical History:  Procedure Laterality Date   CATARACT EXTRACTION  1990   rt   CATARACT EXTRACTION  2005   left   LEFT HEART CATH AND CORONARY ANGIOGRAPHY N/A 10/06/2019   Procedure: LEFT HEART CATH AND CORONARY ANGIOGRAPHY;  Surgeon: Patwardhan, Manish J, MD;  Location: [facility];  Service: Cardiovascular;  Laterality: N/A;   REVISION TOTAL KNEE ARTHROPLASTY  2008   SVT ABLATION N/A 01/18/2020   Procedure: SVT ABLATION;  Surgeon: Taylor, Gregg W, MD;  Location: [facility];  Service: Cardiovascular;  Laterality: N/A;   THUMB ARTHROSCOPY     TONSILLECTOMY      FAMILY HISTORY: The patient family history includes Arthritis in her sister; Brain cancer in her brother; CVA (age of onset: 65) in her sister; CVA (age of onset: 79) in her father; Heart disease in her sister; Pancreatic cancer in her mother.  SOCIAL HISTORY:  The patient  reports that she quit smoking about 72 years ago. Her smoking use included cigarettes. She has never used smokeless tobacco. She reports that she does not currently use alcohol. She reports that she does not use drugs.  REVIEW OF SYSTEMS: Review of Systems  Constitutional: Negative for chills and fever.  HENT:  Negative for hoarse voice and nosebleeds.   Eyes:  Negative for discharge, double vision and pain.  Cardiovascular:  Positive for dyspnea on exertion (improved). Negative for chest pain, claudication, leg swelling, near-syncope, orthopnea, palpitations, paroxysmal nocturnal dyspnea and syncope.  Respiratory:  Negative for hemoptysis and shortness of breath.   Musculoskeletal:  Negative for muscle cramps and myalgias.  Gastrointestinal:  Negative for abdominal pain, constipation, diarrhea, hematemesis, hematochezia, melena, nausea and vomiting.  Neurological:  Negative for dizziness, light-headedness and vertigo.   PHYSICAL EXAM: Vitals with BMI 11/17/2020 11/17/2020  08/24/2020  Height - 5' 3" 5' 3"  Weight - 150 lbs 3 oz 150 lbs 13 oz  BMI - 26.61 26.72  Systolic 146 179 142  Diastolic 61 63 70  Pulse 60 58 78   CONSTITUTIONAL: Well-developed and well-nourished. No acute distress.  SKIN: Skin is warm and dry. No rash noted. No cyanosis. No pallor. No jaundice HEAD: Normocephalic and atraumatic.  EYES: No scleral icterus MOUTH/THROAT: Moist oral membranes.  NECK: No JVD present. No thyromegaly noted.  Bilateral carotid bruits most likely secondary to delayed carotid upstrokes LYMPHATIC: No visible cervical adenopathy.  CHEST Normal respiratory effort. No intercostal retractions  LUNGS: Clear to auscultation bilaterally.  No stridor. No wheezes. No rales.  CARDIOVASCULAR: Regular, positive S1 delayed S2, 3 out of 6 crescendo decrescendo murmur heard at the second right intercostal space, with a component of holosystolic murmur at the apex. ABDOMINAL: Soft, nontender, nondistended, no apparent ascites.  EXTREMITIES: Trace bilateral peripheral edema.  HEMATOLOGIC: No significant bruising NEUROLOGIC: Oriented to person, place, and time. Nonfocal. Normal muscle tone.  PSYCHIATRIC: Normal mood and affect. Normal behavior. Cooperative  CARDIAC DATABASE: EKG: 11/17/2020: Sinus bradycardia, 57 bpm,  nonspecific ST   depressions.  Echocardiogram: 03/24/2020: 1. Left ventricular ejection fraction, by estimation, is 60 to 65%. The left ventricle has normal function. The left ventricle has no regional wall motion abnormalities. There is mild left ventricular hypertrophy. Left ventricular diastolic parameters  were normal. 2. Right ventricular systolic function is normal. The right ventricular size is normal. There is normal pulmonary artery systolic pressure. 3. The mitral valve is degenerative. Mild mitral valve regurgitation. No evidence of mitral stenosis. 4. The aortic valve is calcified. Aortic valve regurgitation is mild. Moderate aortic valve stenosis.  Aortic valve mean gradient measures 25.0 mmHg. Aortic valve Vmax measures 3.12 m/s. 5. The inferior vena cava is normal in size with greater than 50% respiratory variability, suggesting right atrial pressure of 3 mmHg.  Stress Testing: None  Heart Catheterization: 10/06/2019: LM: Normal LAD: Mild luminal irregularities LCx: Normal RCA: Normal   LVEDP 14 mmHg 60 mmHg mid-cavitary gradient on pullback No significant LV-Ao gradient on pullback  Carotid artery duplex  09/09/2019:  Minimal stenosis in the right internal carotid artery (minimal).  Minimal stenosis in the left internal carotid artery (minimal).  Minimal plaque noted in the CCA bilaterally without significant stenosis.  Antegrade right vertebral artery flow. Antegrade left vertebral artery flow.  Follow up studies if clinically indicated.  7 day extended Holter monitor: Predominantly normal sinus rhythm (NSR).  HR 40-210 bpm.  Avg HR 79 bpm. Minimum HR 40 bpm on 08/29/19 at 1:02pm sinus with junctional escape. No atrial fibrillation/NSVT/pause (3 secs or longer). Total ventricular ectopic burden <1%. Total supraventricular ectopic burden <1%. 42 episodes of SVT.  Fastest and longest run was 1hr 17 mins with max HR 210 bpm (avg 155 bpm). Patient triggered events: 6.  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   LABORATORY DATA: CBC Latest Ref Rng & Units 04/22/2020 01/11/2020 11/13/2019  WBC 3.8 - 10.8 Thousand/uL 6.2 6.2 9.1  Hemoglobin 11.7 - 15.5 g/dL 13.6 12.6 11.3(L)  Hematocrit 35.0 - 45.0 % 42.3 37.0 35.1(L)  Platelets 140 - 400 Thousand/uL 195 187 159    CMP Latest Ref Rng & Units 03/22/2020 02/26/2020 01/11/2020  Glucose 65 - 99 mg/dL 99 105(H) 73  BUN 10 - 36 mg/dL _0 Creatinine 0.57 - 1.00 mg/dL 0.77 0.83 0.81  Sodium 134 - 144 mmol/L 137 141 137  Potassium 3.5 - 5.2 mmol/L 5.0 4.5 4.6  Chloride 96 - 106 mmol/L 100 102 99  CO2 20 - 29  mmol/L _1 Calcium 8.7 - 10.3 mg/dL 9.8 10.2 9.8  Total Protein 6.5 - 8.1 g/dL - - -  Total Bilirubin 0.3 - 1.2 mg/dL - - -  Alkaline Phos 38 - 126 U/L - - -  AST 15 - 41 U/L - - -  ALT 0 - 44 U/L - - -    Lipid Panel  Lab Results  Component Value Date   CHOL 112 12/08/2018   HDL 34 (L) 12/08/2018   LDLCALC 62 12/08/2018   TRIG 80 12/08/2018   CHOLHDL 3.3 12/08/2018    No components found for: NTPROBNP No results for input(s): PROBNP in the last 8760 hours. Recent Labs    04/22/20 1411  TSH 3.78    BMP Recent Labs    11/19/19 1041 11/23/19 1533 01/11/20 1007 02/26/20 1451 03/22/20 1115  NA 137   < > 137 141 137  K 5.3*   < > 4.6 4.5 5.0  CL 98   < >  99 102 100  CO2 20   < > 22 23 24  GLUCOSE 87   < > 73 105* 99  BUN 20   < > 20 16 13  CREATININE 0.95   < > 0.81 0.83 0.77  CALCIUM 10.5*   < > 9.8 10.2 9.8  GFRNONAA 53*  --  64 62  --   GFRAA 61  --  74 72  --    < > = values in this interval not displayed.    HEMOGLOBIN A1C Lab Results  Component Value Date   HGBA1C 5.1 08/22/2016   MPG 100 08/22/2016    IMPRESSION:    ICD-10-CM   1. SVT (supraventricular tachycardia) (HCC)  I47.1 EKG 12-Lead    metoprolol succinate (TOPROL XL) 25 MG 24 hr tablet    2. Nonrheumatic aortic valve stenosis  I35.0 metoprolol succinate (TOPROL XL) 25 MG 24 hr tablet    3. Essential hypertension, benign  I10 hydrALAZINE (APRESOLINE) 10 MG tablet    4. Dyspnea on exertion  R06.09     5. Former smoker  Z87.891         RECOMMENDATIONS: Donna [name] is a 87 y.o. female whose past medical history and cardiac risk factors include: hypertension, mild OSA, supraventricular tachycardia, moderate aortic stenosis, mild MR, migraine, GI bleeding, postmenopausal female, advanced age.  SVT (supraventricular tachycardia) (HCC) Underwent EP study and radiofrequency ablation of the slow pathway by Dr. Taylor. Has remained stable since ablation. Discontinue  carvedilol. Start Toprol-XL 25 mg p.o. daily. Monitor for now  Nonrheumatic aortic valve stenosis Echo notes the severity of aortic stenosis to be moderate. No significant gradient on pullback between the LV/AO during invasive angiography. Denies chest pain, syncope, or heart failure symptoms.  If such symptoms arise patient is asked to seek medical attention by going to the closest ED via EMS. Will continue to monitor.  Essential hypertension, benign Office blood pressures are within acceptable range. Home blood pressures are not always at goal. Medications reconciled. Discontinue amlodipine 2.5 mg p.o. daily.  Unable to uptitrate this medication due to lower extremity swelling. Discontinue carvedilol 12.5 mg p.o. twice daily.  Unable to uptitrate as the patient is already holding doses due to bradycardia.   We will transition her to Toprol-XL 25 mg p.o. daily and hydralazine 10 mg p.o. 3 times daily Will reevaluate  Dyspnea on exertion Multifactorial: Underlying valvular heart disease, allergies, and hypertension. Valvular heart disease remains relatively stable. Blood pressure management as discussed above. Overall euvolemic and not in congestive heart failure. Continue to monitor from a cardiovascular standpoint  Former smoker Educated on the importance of continued smoking cessation.    FINAL MEDICATION LIST END OF ENCOUNTER: Meds ordered this encounter  Medications   metoprolol succinate (TOPROL XL) 25 MG 24 hr tablet    Sig: Take 1 tablet (25 mg total) by mouth every morning. Hold if systolic blood pressure (top number) less than 100 mmHg or pulse less than 55 bpm.    Dispense:  30 tablet    Refill:  0   hydrALAZINE (APRESOLINE) 10 MG tablet    Sig: Take 1 tablet (10 mg total) by mouth 3 (three) times daily.    Dispense:  90 tablet    Refill:  0     Medications Discontinued During This Encounter  Medication Reason   amLODipine (NORVASC) 2.5 MG tablet Error    amLODipine (NORVASC) 2.5 MG tablet Discontinued by provider   carvedilol (COREG) 12.5 MG tablet   Change in therapy      Current Outpatient Medications:    Blood Pressure Monitoring KIT, Essential hypertension check bp daily, Disp: 1 kit, Rfl: 0   Calcium Carbonate-Vitamin D 600-400 MG-UNIT per tablet, Take 1 tablet by mouth in the morning and at bedtime., Disp: , Rfl:    gabapentin (NEURONTIN) 250 MG/5ML solution, Take 1 mL (50 mg total) by mouth at bedtime., Disp: 30 mL, Rfl: 5   hydrALAZINE (APRESOLINE) 10 MG tablet, Take 1 tablet (10 mg total) by mouth 3 (three) times daily., Disp: 90 tablet, Rfl: 0   levothyroxine (SYNTHROID) 100 MCG tablet, TAKE 1 TABLET BY MOUTH EVERY DAY 30 MINUTES BEFORE BREAKFAST ON AN EMPTY STOMACH, Disp: 90 tablet, Rfl: 1   Light Mineral Oil-Mineral Oil (RETAINE MGD) 0.5-0.5 % EMUL, Place 1 drop into both eyes in the morning and at bedtime., Disp: , Rfl:    losartan (COZAAR) 100 MG tablet, TAKE 1 TABLET(100 MG) BY MOUTH DAILY, Disp: 90 tablet, Rfl: 2   metoprolol succinate (TOPROL XL) 25 MG 24 hr tablet, Take 1 tablet (25 mg total) by mouth every morning. Hold if systolic blood pressure (top number) less than 100 mmHg or pulse less than 55 bpm., Disp: 30 tablet, Rfl: 0   Multiple Vitamins-Minerals (PRESERVISION AREDS 2) CAPS, Take 1 capsule by mouth 2 (two) times a day. , Disp: , Rfl:    OVER THE COUNTER MEDICATION, Take 2 capsules by mouth daily. MitoQ, Disp: , Rfl:    vitamin B-12 (CYANOCOBALAMIN) 1000 MCG tablet, Take 1 tablet (1,000 mcg total) by mouth daily., Disp: 30 tablet, Rfl: 5  Orders Placed This Encounter  Procedures   EKG 12-Lead    There are no Patient Instructions on file for this visit.   --Continue cardiac medications as reconciled in final medication list. --Return in about 4 weeks (around 12/15/2020) for Follow up, BP. Or sooner if needed. --Continue follow-up with your primary care physician regarding the management of your other chronic  comorbid conditions.  Patient's questions and concerns were addressed to her satisfaction. She voices understanding of the instructions provided during this encounter.   This note was created using a voice recognition software as a result there may be grammatical errors inadvertently enclosed that do not reflect the nature of this encounter. Every attempt is made to correct such errors.  Sunit Tolia, DO, FACC  Pager: [phone] Office: [phone]    

## 2020-11-21 ENCOUNTER — Encounter: Payer: Self-pay | Admitting: Rehabilitative and Restorative Service Providers"

## 2020-11-21 ENCOUNTER — Other Ambulatory Visit: Payer: Self-pay

## 2020-11-21 ENCOUNTER — Ambulatory Visit: Payer: Medicare PPO | Admitting: Rehabilitative and Restorative Service Providers"

## 2020-11-21 ENCOUNTER — Other Ambulatory Visit: Payer: Self-pay | Admitting: Cardiology

## 2020-11-21 DIAGNOSIS — M6281 Muscle weakness (generalized): Secondary | ICD-10-CM

## 2020-11-21 DIAGNOSIS — R2681 Unsteadiness on feet: Secondary | ICD-10-CM | POA: Diagnosis not present

## 2020-11-21 DIAGNOSIS — I10 Essential (primary) hypertension: Secondary | ICD-10-CM

## 2020-11-21 NOTE — Therapy (Signed)
[facility] @ [facility] [place], [place], [address] Phone: [phone]   Fax:  [phone]  Physical Therapy Treatment  Patient Details  Name: Donna [name] MRN: [id number] Date of Birth: 31-Oct-1927 Referring Provider (PT): Sherrie Mustache, NP   Encounter Date: 11/21/2020   PT End of Session - 11/21/20 1020     Visit Number 16    Number of Visits 24    Date for PT Re-Evaluation 12/14/20    Authorization Type [place] approved 12 visits 11/02/20-12/14/20    Authorization - Visit Number 6    Authorization - Number of Visits 12    Progress Note Due on Visit 20    PT Start Time 3016    PT Stop Time 1055    PT Time Calculation (min) 40 min    Activity Tolerance No increased pain;Patient tolerated treatment well    Behavior During Therapy WFL for tasks assessed/performed             Past Medical History:  Diagnosis Date   Aortic stenosis    Hemorrhage of gastrointestinal tract, unspecified    Hypertension    Migraine    Mitral regurgitation    OA (osteoarthritis)    Thyroid cyst     Past Surgical History:  Procedure Laterality Date   CATARACT EXTRACTION  1990   rt   CATARACT EXTRACTION  2005   left   LEFT HEART CATH AND CORONARY ANGIOGRAPHY N/A 10/06/2019   Procedure: LEFT HEART CATH AND CORONARY ANGIOGRAPHY;  Surgeon: Nigel Mormon, MD;  Location: [facility];  Service: Cardiovascular;  Laterality: N/A;   REVISION TOTAL KNEE ARTHROPLASTY  2008   SVT ABLATION N/A 01/18/2020   Procedure: SVT ABLATION;  Surgeon: Evans Lance, MD;  Location: [facility];  Service: Cardiovascular;  Laterality: N/A;   THUMB ARTHROSCOPY     TONSILLECTOMY      There were no vitals filed for this visit.   Subjective Assessment - 11/21/20 1019     Subjective My back was hurting this morning, but it is not now.    Patient Stated Goals improve balance    Currently in Pain?  No/denies                [facility] PT Assessment - 11/21/20 0001       Observation/Other Assessments   Focus on Therapeutic Outcomes (FOTO)  55%                           [facility] Adult PT Treatment/Exercise - 11/21/20 0001       Ambulation/Gait   Gait Comments Amb down long hallway down and back x2 laps without assistive device and SBA.  No loss of balance noted, pt with improved safety during 180 degree turns.      Neuro Re-ed    Neuro Re-ed Details  Standing toe tap to 2 cones and back 2x10B      Lumbar Exercises: Aerobic   Nustep L4 x 6 min with PT present to discuss status and plan for session   seat at 7     Lumbar Exercises: Seated   Other Seated Lumbar Exercises blue plyo ball hip to hip x 10, hip to shoulder x10 each shoulder    Other Seated Lumbar Exercises blue plyo ball chair sit up 2x5      Knee/Hip Exercises: Standing  Lateral Step Up Both;10 reps;Hand Hold: 2    Lateral Step Up Limitations up and over blue foam mat with light UE support    Forward Step Up Both;1 set;10 reps;Hand Hold: 2;Step Height: 6"    Rocker Board 2 minutes    Rocker Board Limitations light UE support, fwd/back and side/side                       PT Short Term Goals - 10/10/20 1208       PT SHORT TERM GOAL #1   Title Pt will be independent with her initial HEP to improve strength and balance.    Time 3    Period Weeks    Status Achieved               PT Long Term Goals - 11/21/20 1108       PT LONG TERM GOAL #1   Title Pt will have greater than 4/5 MMT of the LE which will improve her power and mobility throughout the day.    Status On-going      PT LONG TERM GOAL #4   Title Pt will be able to ambulate throughout the clinic without an AD and without the need for PT supervision during an entire session.    Status Partially Met                   Plan - 11/21/20 1104     Clinical Impression Statement Ms Stampley reports that the new  supine stretches seem to be helping and she has been performing. Pt requires CGA/min A with foot tapping onto 2 cones and back to starting point secondary to decreased balance.  Pt tolerated step over blue foam and back without difficulty with gentle UE support required. Pt continues to demonstrate improvement with ambulation without assistive device.  FOTO assessed today and was 55%.    PT Treatment/Interventions ADLs/Self Care Home Management;Therapeutic activities;Therapeutic exercise;Neuromuscular re-education;Manual techniques;Patient/family education;Gait training    PT Next Visit Plan Progress LE and functional strengthening, balance, continue balance disc challenges prior to gait    Consulted and Agree with Plan of Care Patient             Patient will benefit from skilled therapeutic intervention in order to improve the following deficits and impairments:  Pain, Decreased coordination, Decreased mobility, Decreased strength, Decreased range of motion, Decreased endurance, Decreased activity tolerance, Decreased balance, Difficulty walking, Impaired flexibility  Visit Diagnosis: Unsteadiness on feet  Muscle weakness (generalized)     Problem List Patient Active Problem List   Diagnosis Date Noted   Mild obstructive sleep apnea 10/23/2020   Excessive daytime sleepiness 07/25/2020   RLS (restless legs syndrome) 07/25/2020   Intermittent palpitations 07/25/2020   Nonrheumatic aortic valve insufficiency    Bradycardia 11/13/2019   Mitral regurgitation and aortic stenosis 11/12/2019   Severe mitral regurgitation 11/12/2019   SVT (supraventricular tachycardia) ([facility]) 11/12/2019   Exertional dyspnea 10/02/2019   Overweight (BMI 25.0-29.9) 10/14/2017   BMI 27.0-27.9,adult 10/14/2017   Balance problem 10/14/2017   Neuropathic pain 09/03/2016   Bilateral nonexudative age-related macular degeneration 09/03/2016   Generalized osteoarthritis of multiple sites 05/07/2013    Hypothyroidism 05/07/2013   Cyst of thyroid 05/07/2013   Hyperhidrosis 05/07/2013   Essential hypertension, benign 05/07/2013   Cough 01/31/2012    Donna [name], PT, DPT 11/21/2020, 11:12 AM  [facility] @ [facility] [address] [place], [place],  [address] Phone: [phone]   Fax:  [phone]  Name: Donna [name] MRN: [id number] Date of Birth: 11-Oct-1927

## 2020-11-24 ENCOUNTER — Encounter: Payer: Self-pay | Admitting: Physical Therapy

## 2020-11-24 ENCOUNTER — Other Ambulatory Visit: Payer: Self-pay

## 2020-11-24 ENCOUNTER — Ambulatory Visit: Payer: Medicare PPO | Admitting: Physical Therapy

## 2020-11-24 DIAGNOSIS — M6281 Muscle weakness (generalized): Secondary | ICD-10-CM

## 2020-11-24 DIAGNOSIS — R2681 Unsteadiness on feet: Secondary | ICD-10-CM | POA: Diagnosis not present

## 2020-11-24 NOTE — Therapy (Signed)
[facility] @ [place], [place], [address] Phone: [phone]   Fax:  [phone]  Physical Therapy Treatment  Patient Details  Name: Donna [name] MRN: [id number] Date of Birth: [birth year] Referring Provider (PT): Sherrie Mustache, NP   Encounter Date: 11/24/2020   PT End of Session - 11/24/20 1109     Visit Number 17    Number of Visits 24    Date for PT Re-Evaluation 12/14/20    Authorization Type [place] approved 12 visits 11/02/20-12/14/20    Authorization - Visit Number 7    Authorization - Number of Visits 12    Progress Note Due on Visit 20    PT Start Time 1104    PT Stop Time 1144    PT Time Calculation (min) 40 min    Activity Tolerance No increased pain;Patient tolerated treatment well    Behavior During Therapy WFL for tasks assessed/performed             Past Medical History:  Diagnosis Date   Aortic stenosis    Hemorrhage of gastrointestinal tract, unspecified    Hypertension    Migraine    Mitral regurgitation    OA (osteoarthritis)    Thyroid cyst     Past Surgical History:  Procedure Laterality Date   CATARACT EXTRACTION  1990   rt   CATARACT EXTRACTION  2005   left   LEFT HEART CATH AND CORONARY ANGIOGRAPHY N/A 10/06/2019   Procedure: LEFT HEART CATH AND CORONARY ANGIOGRAPHY;  Surgeon: Nigel Mormon, MD;  Location: [facility];  Service: Cardiovascular;  Laterality: N/A;   REVISION TOTAL KNEE ARTHROPLASTY  2008   SVT ABLATION N/A 01/18/2020   Procedure: SVT ABLATION;  Surgeon: Evans Lance, MD;  Location: [facility];  Service: Cardiovascular;  Laterality: N/A;   THUMB ARTHROSCOPY     TONSILLECTOMY      There were no vitals filed for this visit.   Subjective Assessment - 11/24/20 1106     Subjective I am getting good carry over with my steadiness with walking, even my daughter noticed.    Pertinent History HTN,  migraines, Rt TKA 2008    Limitations Walking;House hold activities    How long can you stand comfortably? sometimes unable to complete fixing a meal without needing intermittent breaks    Patient Stated Goals improve balance    Currently in Pain? No/denies                               [facility] Adult PT Treatment/Exercise - 11/24/20 0001       Exercises   Exercises Shoulder;Lumbar;Knee/Hip      Lumbar Exercises: Aerobic   Nustep L5 x 6' seat 7 PT present to monitor and update status      Lumbar Exercises: Seated   Sit to Stand 10 reps   hold blue plyo ball   Other Seated Lumbar Exercises blue plyo ball hip to hip x 10, hip to shoulder x10 each shoulder    Other Seated Lumbar Exercises blue plyo ball chair sit up 1x10      Knee/Hip Exercises: Stretches   Other Knee/Hip Stretches supine butterfly x 20 sec    Other Knee/Hip Stretches windshield wipers x 10, 3" hold each way      Knee/Hip Exercises: Standing   Rocker Board 2 minutes  Rocker Board Limitations light UE support, fwd/back and side/side                 Balance Exercises - 11/24/20 0001       Balance Exercises: Standing   Step Over Hurdles / Cones with UE support, over and back x 5 Rt/Lt fwd and lateral in parallel bars    Other Standing Exercises disc stepping strategies: SLS vector clock taps side, fwd, x 5 each, then double tap for longer SLS, close supervision no UE support, bwd step and return with weight shift x 5 each close supervision                  PT Short Term Goals - 10/10/20 1208       PT SHORT TERM GOAL #1   Title Pt will be independent with her initial HEP to improve strength and balance.    Time 3    Period Weeks    Status Achieved               PT Long Term Goals - 11/21/20 1108       PT LONG TERM GOAL #1   Title Pt will have greater than 4/5 MMT of the LE which will improve her power and mobility throughout the day.    Status On-going      PT  LONG TERM GOAL #4   Title Pt will be able to ambulate throughout the clinic without an AD and without the need for PT supervision during an entire session.    Status Partially Met                   Plan - 11/24/20 1129     Clinical Impression Statement Pt continues to display improved gait pattern without LOB and signif reduced scissoring noted.  She ambulated 2x200' today without LOB and improved 2-track foot placement. She is compliant with new hip stretches in bed daily.  She has most challenge with backward stepping strategy with weight shift.  She demos improving SLS stability with multi-direcitonal double tap success lat and fwd.  Close supervision during disc balance challenges due to minor LOB with this.  Continue along POC.             Patient will benefit from skilled therapeutic intervention in order to improve the following deficits and impairments:     Visit Diagnosis: Unsteadiness on feet  Muscle weakness (generalized)     Problem List Patient Active Problem List   Diagnosis Date Noted   Mild obstructive sleep apnea 10/23/2020   Excessive daytime sleepiness 07/25/2020   RLS (restless legs syndrome) 07/25/2020   Intermittent palpitations 07/25/2020   Nonrheumatic aortic valve insufficiency    Bradycardia 11/13/2019   Mitral regurgitation and aortic stenosis 11/12/2019   Severe mitral regurgitation 11/12/2019   SVT (supraventricular tachycardia) (HCC) 11/12/2019   Exertional dyspnea 10/02/2019   Overweight (BMI 25.0-29.9) 10/14/2017   BMI 27.0-27.9,adult 10/14/2017   Balance problem 10/14/2017   Neuropathic pain 09/03/2016   Bilateral nonexudative age-related macular degeneration 09/03/2016   Generalized osteoarthritis of multiple sites 05/07/2013   Hypothyroidism 05/07/2013   Cyst of thyroid 05/07/2013   Hyperhidrosis 05/07/2013   Essential hypertension, benign 05/07/2013   Cough 01/31/2012    Johanna Beuhring, PT 11/24/20 11:43 AM   [facility] @ [place] [address] [place], [place], [address] Phone: [phone]   Fax:  [phone]  Name: Suan Bell Toppin MRN: [id number]   Date of Birth: 09/12/1927    

## 2020-11-29 ENCOUNTER — Ambulatory Visit: Payer: Medicare PPO | Admitting: Rehabilitative and Restorative Service Providers"

## 2020-11-29 ENCOUNTER — Telehealth: Payer: Self-pay | Admitting: *Deleted

## 2020-11-29 ENCOUNTER — Other Ambulatory Visit: Payer: Self-pay

## 2020-11-29 ENCOUNTER — Encounter: Payer: Self-pay | Admitting: Rehabilitative and Restorative Service Providers"

## 2020-11-29 DIAGNOSIS — R2681 Unsteadiness on feet: Secondary | ICD-10-CM | POA: Diagnosis not present

## 2020-11-29 DIAGNOSIS — M6281 Muscle weakness (generalized): Secondary | ICD-10-CM

## 2020-11-29 NOTE — Therapy (Signed)
[facility] @ [place], [place], [address] Phone: [phone]   Fax:  [phone]  Physical Therapy Treatment  Patient Details  Name: Donna [name] MRN: [id number] Date of Birth: Dec 14, 1927 Referring Provider (PT): Sherrie Mustache, NP   Encounter Date: 11/29/2020   PT End of Session - 11/29/20 1019     Visit Number 18    Date for PT Re-Evaluation 12/14/20    Authorization Type [place] approved 12 visits 11/02/20-12/14/20    Authorization - Visit Number 8    Authorization - Number of Visits 12    Progress Note Due on Visit 20    PT Start Time 1013    PT Stop Time 1055    PT Time Calculation (min) 42 min    Activity Tolerance No increased pain;Patient tolerated treatment well    Behavior During Therapy WFL for tasks assessed/performed             Past Medical History:  Diagnosis Date   Aortic stenosis    Hemorrhage of gastrointestinal tract, unspecified    Hypertension    Migraine    Mitral regurgitation    OA (osteoarthritis)    Thyroid cyst     Past Surgical History:  Procedure Laterality Date   CATARACT EXTRACTION  1990   rt   CATARACT EXTRACTION  2005   left   LEFT HEART CATH AND CORONARY ANGIOGRAPHY N/A 10/06/2019   Procedure: LEFT HEART CATH AND CORONARY ANGIOGRAPHY;  Surgeon: Nigel Mormon, MD;  Location: [facility];  Service: Cardiovascular;  Laterality: N/A;   REVISION TOTAL KNEE ARTHROPLASTY  2008   SVT ABLATION N/A 01/18/2020   Procedure: SVT ABLATION;  Surgeon: Evans Lance, MD;  Location: [facility];  Service: Cardiovascular;  Laterality: N/A;   THUMB ARTHROSCOPY     TONSILLECTOMY      There were no vitals filed for this visit.   Subjective Assessment - 11/29/20 1019     Subjective I think this is helping my back.    Patient Stated Goals improve balance    Currently in Pain? No/denies                [facility] PT  Assessment - 11/29/20 0001       6 Minute walk- Post Test   6 Minute Walk Post Test yes    HR (bpm) 73    02 Sat (%RA) 99 %    Modified Borg Scale for Dyspnea 3- Moderate shortness of breath or breathing difficulty      6 minute walk test results    Aerobic Endurance Distance Walked 682    Endurance additional comments One brief standing recovery period at half-way point.                           [facility] Adult PT Treatment/Exercise - 11/29/20 0001       High Level Balance   High Level Balance Comments Ball toss standing on blue foam.      Neuro Re-ed    Neuro Re-ed Details  Standing toe tap to 2 cones and back 2x10B      Lumbar Exercises: Aerobic   Nustep L5 x 6' seat 7 PT present to monitor and update status      Lumbar Exercises: Seated   Other Seated Lumbar Exercises blue plyo ball hip to hip x 10,  hip to shoulder x10 each shoulder    Other Seated Lumbar Exercises blue plyo ball chair sit up 1x10      Knee/Hip Exercises: Standing   Rocker Board 2 minutes    Rocker Board Limitations light UE support, fwd/back and side/side      Knee/Hip Exercises: Seated   Sit to Sand 1 set;10 reps   holding blue plyoball                      PT Short Term Goals - 10/10/20 1208       PT SHORT TERM GOAL #1   Title Pt will be independent with her initial HEP to improve strength and balance.    Time 3    Period Weeks    Status Achieved               PT Long Term Goals - 11/29/20 1057       PT LONG TERM GOAL #4   Title Pt will be able to ambulate throughout the clinic without an AD and without the need for PT supervision during an entire session.    Status Partially Met                   Plan - 11/29/20 1054     Clinical Impression Statement Ms Lartigue continues to progress towards improved balance and activity tolerance.  She was able to tolerate 6 minute walk test today with only one brief standing recovery period. Pt continues to  have most difficulty with double cone tap exercise and require CGA to maintain steadying.  During ball toss on blue foam, pt only had one loss of balance requiring pt to return to sitting position.    Personal Factors and Comorbidities Age;Time since onset of injury/illness/exacerbation;Fitness    PT Treatment/Interventions ADLs/Self Care Home Management;Therapeutic activities;Therapeutic exercise;Neuromuscular re-education;Manual techniques;Patient/family education;Gait training    PT Next Visit Plan Progress LE and functional strengthening, balance, continue balance disc challenges prior to gait    Consulted and Agree with Plan of Care Patient             Patient will benefit from skilled therapeutic intervention in order to improve the following deficits and impairments:  Pain, Decreased coordination, Decreased mobility, Decreased strength, Decreased range of motion, Decreased endurance, Decreased activity tolerance, Decreased balance, Difficulty walking, Impaired flexibility  Visit Diagnosis: Unsteadiness on feet  Muscle weakness (generalized)     Problem List Patient Active Problem List   Diagnosis Date Noted   Mild obstructive sleep apnea 10/23/2020   Excessive daytime sleepiness 07/25/2020   RLS (restless legs syndrome) 07/25/2020   Intermittent palpitations 07/25/2020   Nonrheumatic aortic valve insufficiency    Bradycardia 11/13/2019   Mitral regurgitation and aortic stenosis 11/12/2019   Severe mitral regurgitation 11/12/2019   SVT (supraventricular tachycardia) ([facility]) 11/12/2019   Exertional dyspnea 10/02/2019   Overweight (BMI 25.0-29.9) 10/14/2017   BMI 27.0-27.9,adult 10/14/2017   Balance problem 10/14/2017   Neuropathic pain 09/03/2016   Bilateral nonexudative age-related macular degeneration 09/03/2016   Generalized osteoarthritis of multiple sites 05/07/2013   Hypothyroidism 05/07/2013   Cyst of thyroid 05/07/2013   Hyperhidrosis 05/07/2013   Essential  hypertension, benign 05/07/2013   Cough 01/31/2012    Juel Burrow, PT, DPT 11/29/2020, 10:59 AM  [facility] @ [place], [place], [address] Phone: [phone]   Fax:  [phone]  Name: Ernesha Ramone MRN: [id number] Date of Birth:  03/18/1927    

## 2020-11-29 NOTE — Telephone Encounter (Signed)
Patient dropped off Handicap Placard to be filled out.  Placed in Jessica's folder to review and sign.  Call #[phone] once ready to pick up.

## 2020-12-01 ENCOUNTER — Encounter: Payer: Self-pay | Admitting: Physical Therapy

## 2020-12-01 ENCOUNTER — Ambulatory Visit: Payer: Medicare PPO | Admitting: Physical Therapy

## 2020-12-01 ENCOUNTER — Other Ambulatory Visit: Payer: Self-pay

## 2020-12-01 DIAGNOSIS — M6281 Muscle weakness (generalized): Secondary | ICD-10-CM

## 2020-12-01 DIAGNOSIS — R2681 Unsteadiness on feet: Secondary | ICD-10-CM

## 2020-12-01 NOTE — Therapy (Signed)
[facility] @ [place], [place], [address] Phone: [phone]   Fax:  [phone]  Physical Therapy Treatment  Patient Details  Name: Donna [name] MRN: [id number] Date of Birth: 1927/03/10 Referring Provider (PT): Sherrie Mustache, NP   Encounter Date: 12/01/2020   PT End of Session - 12/01/20 1105     Visit Number 19    Number of Visits 24    Date for PT Re-Evaluation 12/14/20    Authorization Type [place] approved 12 visits 11/02/20-12/14/20    Authorization - Visit Number 9    Authorization - Number of Visits 12    Progress Note Due on Visit 20    PT Start Time 1100    PT Stop Time 1138    PT Time Calculation (min) 38 min    Activity Tolerance No increased pain;Patient tolerated treatment well    Behavior During Therapy WFL for tasks assessed/performed             Past Medical History:  Diagnosis Date   Aortic stenosis    Hemorrhage of gastrointestinal tract, unspecified    Hypertension    Migraine    Mitral regurgitation    OA (osteoarthritis)    Thyroid cyst     Past Surgical History:  Procedure Laterality Date   CATARACT EXTRACTION  1990   rt   CATARACT EXTRACTION  2005   left   LEFT HEART CATH AND CORONARY ANGIOGRAPHY N/A 10/06/2019   Procedure: LEFT HEART CATH AND CORONARY ANGIOGRAPHY;  Surgeon: Nigel Mormon, MD;  Location: [facility];  Service: Cardiovascular;  Laterality: N/A;   REVISION TOTAL KNEE ARTHROPLASTY  2008   SVT ABLATION N/A 01/18/2020   Procedure: SVT ABLATION;  Surgeon: Evans Lance, MD;  Location: [facility];  Service: Cardiovascular;  Laterality: N/A;   THUMB ARTHROSCOPY     TONSILLECTOMY      There were no vitals filed for this visit.   Subjective Assessment - 12/01/20 1104     Subjective I needed to rest after last session but it was good.  PT has really helped me.    Pertinent History HTN,  migraines, Rt TKA 2008    Limitations Walking;House hold activities    How long can you stand comfortably? sometimes unable to complete fixing a meal without needing intermittent breaks    Patient Stated Goals improve balance    Currently in Pain? No/denies                               [facility] Adult PT Treatment/Exercise - 12/01/20 0001       Ambulation/Gait   Gait Comments 6 min walk test 704', BORG level 3      Exercises   Exercises Shoulder;Lumbar;Knee/Hip      Lumbar Exercises: Stretches   Lower Trunk Rotation 3 reps;10 seconds    Other Lumbar Stretch Exercise butterfly stretch 1x30, supine      Lumbar Exercises: Seated   Sit to Stand 10 reps    Sit to Stand Limitations hold 5lb, from mat table    Other Seated Lumbar Exercises blue plyo ball hip to hip x 10, hip to shoulder x10 each shoulder    Other Seated Lumbar Exercises blue plyo ball chair sit up 1x10      Knee/Hip Exercises: Standing   Rocker Board 2 minutes  Rocker Board Limitations light UE support, fwd/back and side/side    Other Standing Knee Exercises step up and over 2 stacked blue foam pads with bil UE support x 10      Shoulder Exercises: Power [employer] 10 reps    Row Limitations 1x5 in each stagger stance position (standing)                 Balance Exercises - 12/01/20 0001       Balance Exercises: Standing   SLS with Vectors 3 reps   double taps to disc fwd/lat/back x 3 rounds, Rt/Lt                 PT Short Term Goals - 10/10/20 1208       PT SHORT TERM GOAL #1   Title Pt will be independent with her initial HEP to improve strength and balance.    Time 3    Period Weeks    Status Achieved               PT Long Term Goals - 11/29/20 1057       PT LONG TERM GOAL #4   Title Pt will be able to ambulate throughout the clinic without an AD and without the need for PT supervision during an entire session.    Status Partially Met                    Plan - 12/01/20 1144     Clinical Impression Statement Pt reported fatigue after last session's 6 min walk test but wanted to perform another one again today.  She demo'd no LOB and only requires supervision at this time for safety with gait.  She covered 704' during 6 min walk test with BORG rating of 3 afterwards.  PT progressed cable pulley row today to standing in stagger stance which Pt reported she felt work her back more but did not cause pain.  Pt continues to be challenged with high level balance requiring balance in SLS with multi-directional stepping and cone taps.  She is nearing D/C and will be ready at the end of her cert period.    Rehab Potential Good    PT Frequency 2x / week    PT Duration 6 weeks    PT Next Visit Plan 20th visit PN next time, Progress LE and functional strengthening, balance, continue balance disc challenges prior to gait, continue 6 MWT practice    PT Home Exercise Plan Access Code [email or web address]    Consulted and Agree with Plan of Care Patient             Patient will benefit from skilled therapeutic intervention in order to improve the following deficits and impairments:     Visit Diagnosis: Unsteadiness on feet  Muscle weakness (generalized)     Problem List Patient Active Problem List   Diagnosis Date Noted   Mild obstructive sleep apnea 10/23/2020   Excessive daytime sleepiness 07/25/2020   RLS (restless legs syndrome) 07/25/2020   Intermittent palpitations 07/25/2020   Nonrheumatic aortic valve insufficiency    Bradycardia 11/13/2019   Mitral regurgitation and aortic stenosis 11/12/2019   Severe mitral regurgitation 11/12/2019   SVT (supraventricular tachycardia) ([facility]) 11/12/2019   Exertional dyspnea 10/02/2019   Overweight (BMI 25.0-29.9) 10/14/2017   BMI 27.0-27.9,adult 10/14/2017   Balance problem 10/14/2017   Neuropathic pain 09/03/2016   Bilateral nonexudative age-related macular degeneration 09/03/2016    Generalized osteoarthritis of multiple  sites 05/07/2013   Hypothyroidism 05/07/2013   Cyst of thyroid 05/07/2013   Hyperhidrosis 05/07/2013   Essential hypertension, benign 05/07/2013   Cough 01/31/2012    Baruch Merl, PT 12/01/20 12:36 PM   [facility] @ [place], [place], [address] Phone: [phone]   Fax:  [phone]  Name: Donna [name] MRN: [id number] Date of Birth: 1927/06/15

## 2020-12-02 ENCOUNTER — Telehealth: Payer: Self-pay | Admitting: Nurse Practitioner

## 2020-12-02 NOTE — Telephone Encounter (Signed)
I called and left a vm for the patient that her handicap placard was complete and ready to be picked up.

## 2020-12-05 ENCOUNTER — Other Ambulatory Visit: Payer: Self-pay

## 2020-12-05 ENCOUNTER — Ambulatory Visit: Payer: Medicare PPO | Admitting: Physical Therapy

## 2020-12-05 ENCOUNTER — Encounter: Payer: Self-pay | Admitting: Physical Therapy

## 2020-12-05 ENCOUNTER — Encounter: Payer: Medicare PPO | Admitting: Rehabilitative and Restorative Service Providers"

## 2020-12-05 DIAGNOSIS — R2681 Unsteadiness on feet: Secondary | ICD-10-CM | POA: Diagnosis not present

## 2020-12-05 DIAGNOSIS — M6281 Muscle weakness (generalized): Secondary | ICD-10-CM

## 2020-12-05 NOTE — Therapy (Signed)
[facility] @ [address] [place], [place], [address] Phone: [phone]   Fax:  [phone]  Physical Therapy Treatment  Patient Details  Name: Donna [name] MRN: [id number] Date of Birth: 1927/12/28 Referring Provider (PT): Sherrie Mustache, NP Progress Note Reporting Period 11/02/2020 to 12/05/2020  See note below for Objective Data and Assessment of Progress/Goals.     Encounter Date: 12/05/2020   PT End of Session - 12/05/20 1250     Visit Number 20    Date for PT Re-Evaluation 12/14/20    Authorization Type [place] approved 12 visits 11/02/20-12/14/20    Authorization - Visit Number 10    Authorization - Number of Visits 12    Progress Note Due on Visit 20    PT Start Time 1230    PT Stop Time 1308    PT Time Calculation (min) 38 min    Activity Tolerance No increased pain;Patient tolerated treatment well    Behavior During Therapy WFL for tasks assessed/performed             Past Medical History:  Diagnosis Date   Aortic stenosis    Hemorrhage of gastrointestinal tract, unspecified    Hypertension    Migraine    Mitral regurgitation    OA (osteoarthritis)    Thyroid cyst     Past Surgical History:  Procedure Laterality Date   CATARACT EXTRACTION  1990   rt   CATARACT EXTRACTION  2005   left   LEFT HEART CATH AND CORONARY ANGIOGRAPHY N/A 10/06/2019   Procedure: LEFT HEART CATH AND CORONARY ANGIOGRAPHY;  Surgeon: Nigel Mormon, MD;  Location: [facility];  Service: Cardiovascular;  Laterality: N/A;   REVISION TOTAL KNEE ARTHROPLASTY  2008   SVT ABLATION N/A 01/18/2020   Procedure: SVT ABLATION;  Surgeon: Evans Lance, MD;  Location: [facility];  Service: Cardiovascular;  Laterality: N/A;   THUMB ARTHROSCOPY     TONSILLECTOMY      There were no vitals filed for this visit.   Subjective Assessment - 12/05/20 1233     Subjective I am  doing well.    Pertinent History HTN, migraines, Rt TKA 2008    How long can you stand comfortably? sometimes unable to complete fixing a meal without needing intermittent breaks    Patient Stated Goals improve balance    Currently in Pain? No/denies                [facility] PT Assessment - 12/05/20 0001       Assessment   Medical Diagnosis imabalance, chronic low back pain    Referring Provider (PT) Sherrie Mustache, NP    Prior Therapy none      Precautions   Precautions None      Restrictions   Weight Bearing Restrictions No      Strength   Right Hip Flexion 4+/5    Right Hip ABduction 3+/5    Left Hip Flexion 4/5    Left Hip ABduction 3+/5    Right Knee Flexion 4-/5    Right Knee Extension 4/5    Left Knee Flexion 4-/5    Left Knee Extension 4/5                           [facility] Adult PT Treatment/Exercise - 12/05/20 0001       Ambulation/Gait  Gait Comments 6 min walk test 704', BORG level 3      Lumbar Exercises: Stretches   Lower Trunk Rotation 3 reps;10 seconds    Other Lumbar Stretch Exercise butterfly stretch 1x30, supine      Lumbar Exercises: Aerobic   Nustep L5 x 6' seat 7 PT present to monitor and update status      Lumbar Exercises: Seated   Sit to Stand 10 reps    Sit to Stand Limitations hold 5lb, from mat table    Other Seated Lumbar Exercises blue plyo ball hip to hip x 10, hip to shoulder x10 each shoulder      Knee/Hip Exercises: Standing   Rocker Board 2 minutes    Rocker Board Limitations light UE support, fwd/back and side/side    Other Standing Knee Exercises step up and over 2 stacked blue foam pads with bil UE support x 10      Shoulder Exercises: Power [employer] 10 reps    Row Limitations 1x5 in each stagger stance position (standing)                       PT Short Term Goals - 12/05/20 1234       PT SHORT TERM GOAL #1   Title Pt will be independent with her initial HEP to improve strength and  balance.    Time 3    Period Weeks    Status Achieved               PT Long Term Goals - 12/05/20 1234       PT LONG TERM GOAL #1   Title Pt will have greater than 4/5 MMT of the LE which will improve her power and mobility throughout the day.    Time 6    Period Weeks    Status On-going      PT LONG TERM GOAL #2   Title Pt will be able to complete 5x sit to stand in less 14 sec without the need for UE support on her thighs, to reflect improvements in LE strength and functional stability.    Baseline 11    Time 6    Period Weeks    Status Achieved      PT LONG TERM GOAL #3   Title Pt will have atleast 6 point increase on her BERG score which will reflect a clinically significant improvement in her balance and decrease her risk of falls.    Baseline 7 point improvement    Time 6    Period Weeks    Status Achieved      PT LONG TERM GOAL #4   Title Pt will be able to ambulate throughout the clinic without an AD and without the need for PT supervision during an entire session.    Time 6    Period Weeks    Status Partially Met      PT LONG TERM GOAL #5   Title Pt will complete TUG with LRAD in less than 14 sec to reflect improvements in her mobility and safety with short walking distances.    Baseline 12 sec without AD    Time 6    Period Weeks    Status Achieved                   Plan - 12/05/20 1251     Clinical Impression Statement Patient is able to do a 6 minute walk  test with 704 feet, BORG level 3. Patient has increased strength in bilateral legs at strength 4/5 with exception of abduction 3+/5. Patient is able to walk in the gym without her cane. Patient feels she is walking at home in the house without the cane but when outside she uses her cane. Patient feels she can stand longer than when she started therapy. Patient had to rest after she did the nustep due to fatique and got a little out of breath.    Personal Factors and Comorbidities Age;Time  since onset of injury/illness/exacerbation;Fitness    Examination-Activity Limitations Locomotion Level;Stand    Examination-Participation Restrictions Cleaning;Community Activity    Stability/Clinical Decision Making Stable/Uncomplicated    Rehab Potential Good    PT Frequency 2x / week    PT Duration 2 weeks    PT Treatment/Interventions ADLs/Self Care Home Management;Therapeutic activities;Therapeutic exercise;Neuromuscular re-education;Manual techniques;Patient/family education;Gait training    PT Next Visit Plan Progress LE and functional strengthening, balance, continue balance disc challenges prior to gait, continue 6 MWT practice; discharge next visit.    PT Home Exercise Plan Access Code [email or web address]    Recommended Other Services MD signed her notes    Consulted and Agree with Plan of Care Patient             Patient will benefit from skilled therapeutic intervention in order to improve the following deficits and impairments:  Pain, Decreased coordination, Decreased mobility, Decreased strength, Decreased range of motion, Decreased endurance, Decreased activity tolerance, Decreased balance, Difficulty walking, Impaired flexibility  Visit Diagnosis: Unsteadiness on feet  Muscle weakness (generalized)     Problem List Patient Active Problem List   Diagnosis Date Noted   Mild obstructive sleep apnea 10/23/2020   Excessive daytime sleepiness 07/25/2020   RLS (restless legs syndrome) 07/25/2020   Intermittent palpitations 07/25/2020   Nonrheumatic aortic valve insufficiency    Bradycardia 11/13/2019   Mitral regurgitation and aortic stenosis 11/12/2019   Severe mitral regurgitation 11/12/2019   SVT (supraventricular tachycardia) ([place]) 11/12/2019   Exertional dyspnea 10/02/2019   Overweight (BMI 25.0-29.9) 10/14/2017   BMI 27.0-27.9,adult 10/14/2017   Balance problem 10/14/2017   Neuropathic pain 09/03/2016   Bilateral nonexudative age-related macular degeneration  09/03/2016   Generalized osteoarthritis of multiple sites 05/07/2013   Hypothyroidism 05/07/2013   Cyst of thyroid 05/07/2013   Hyperhidrosis 05/07/2013   Essential hypertension, benign 05/07/2013   Cough 01/31/2012    Earlie Counts, PT 12/05/20 1:15 PM  [facility] @ [address] [place], [place], [address] Phone: [phone]   Fax:  [phone]  Name: Enis Leatherwood MRN: [id number] Date of Birth: 01/30/1927

## 2020-12-07 ENCOUNTER — Other Ambulatory Visit: Payer: Self-pay

## 2020-12-07 ENCOUNTER — Ambulatory Visit: Payer: Medicare PPO

## 2020-12-07 DIAGNOSIS — R2681 Unsteadiness on feet: Secondary | ICD-10-CM

## 2020-12-07 DIAGNOSIS — M6281 Muscle weakness (generalized): Secondary | ICD-10-CM

## 2020-12-07 NOTE — Patient Instructions (Signed)
Continue current HEP 

## 2020-12-07 NOTE — Therapy (Signed)
[facility] @ [address] [place], [place], [address] Phone: [phone]   Fax:  [phone]  Physical Therapy Discharge Summary  Patient Details  Name: Donna [name] MRN: [id number] Date of Birth: May 07, 1927 Referring Provider (PT): Sherrie Mustache, NP   Encounter Date: 12/07/2020   PT End of Session - 12/07/20 1026     Visit Number 21    Number of Visits 24    Date for PT Re-Evaluation 12/14/20    Authorization Type [place] approved 12 visits 11/02/20-12/14/20    Authorization - Visit Number 21    Authorization - Number of Visits 24    Progress Note Due on Visit 20    PT Start Time 1017    PT Stop Time 1050    PT Time Calculation (min) 33 min    Equipment Utilized During Treatment Gait belt    Activity Tolerance No increased pain;Patient tolerated treatment well    Behavior During Therapy WFL for tasks assessed/performed             Past Medical History:  Diagnosis Date   Aortic stenosis    Hemorrhage of gastrointestinal tract, unspecified    Hypertension    Migraine    Mitral regurgitation    OA (osteoarthritis)    Thyroid cyst     Past Surgical History:  Procedure Laterality Date   CATARACT EXTRACTION  1990   rt   CATARACT EXTRACTION  2005   left   LEFT HEART CATH AND CORONARY ANGIOGRAPHY N/A 10/06/2019   Procedure: LEFT HEART CATH AND CORONARY ANGIOGRAPHY;  Surgeon: Nigel Mormon, MD;  Location: [facility];  Service: Cardiovascular;  Laterality: N/A;   REVISION TOTAL KNEE ARTHROPLASTY  2008   SVT ABLATION N/A 01/18/2020   Procedure: SVT ABLATION;  Surgeon: Evans Lance, MD;  Location: [facility];  Service: Cardiovascular;  Laterality: N/A;   THUMB ARTHROSCOPY     TONSILLECTOMY      There were no vitals filed for this visit.   Subjective Assessment - 12/07/20 1022     Subjective Patient states "I am fine today".  She denies any pain.     Pertinent History HTN, migraines, Rt TKA 2008    Limitations Walking;House hold activities    How long can you stand comfortably? Patient states she can complete simple meals now.  But having to fix a lot as with holidays.    Patient Stated Goals improve balance    Currently in Pain? No/denies    Pain Onset More than a month ago                [facility] PT Assessment - 12/07/20 0001       Assessment   Medical Diagnosis imabalance, chronic low back pain    Referring Provider (PT) Sherrie Mustache, NP    Prior Therapy none      Precautions   Precautions None      Restrictions   Weight Bearing Restrictions No      Strength   Right Hip Flexion 4+/5    Right Hip ABduction 3+/5    Left Hip Flexion 4/5    Left Hip ABduction 4-/5    Right Knee Flexion 4-/5    Right Knee Extension 4/5    Left Knee Flexion 4-/5    Left Knee Extension 4/5      Ambulation/Gait   Gait Comments 6 min walk test  704', BORG level 3                           [facility] Adult PT Treatment/Exercise - 12/07/20 0001       Lumbar Exercises: Stretches   Lower Trunk Rotation 3 reps;10 seconds    Other Lumbar Stretch Exercise butterfly stretch 1x30, supine      Lumbar Exercises: Aerobic   Nustep L5 x 6' seat 7 PT present to monitor and update status      Lumbar Exercises: Seated   Sit to Stand 10 reps    Sit to Stand Limitations hold 5lb, from mat table    Other Seated Lumbar Exercises blue plyo ball hip to hip x 10, hip to shoulder x10 each shoulder      Knee/Hip Exercises: Standing   Rocker Board 2 minutes    Rocker Board Limitations light UE support, fwd/back and side/side    Other Standing Knee Exercises step up and over 2 stacked blue foam pads with bil UE support x 10      Shoulder Exercises: Power [employer] 10 reps    Row Limitations 1x5 in each stagger stance position (standing)                     PT Education - 12/07/20 1025     Education Details verbal  cues for posture throughout seated exercises              PT Short Term Goals - 12/07/20 1054       PT SHORT TERM GOAL #1   Title Pt will be independent with her initial HEP to improve strength and balance.    Time 3    Period Weeks    Status Achieved               PT Long Term Goals - 12/07/20 1054       PT LONG TERM GOAL #1   Title Pt will have greater than 4/5 MMT of the LE which will improve her power and mobility throughout the day.    Time 6    Period Weeks    Status On-going      PT LONG TERM GOAL #2   Title Pt will be able to complete 5x sit to stand in less 14 sec without the need for UE support on her thighs, to reflect improvements in LE strength and functional stability.    Baseline 11    Time 6    Period Weeks    Status Achieved      PT LONG TERM GOAL #3   Title Pt will have atleast 6 point increase on her BERG score which will reflect a clinically significant improvement in her balance and decrease her risk of falls.    Baseline 7 point improvement    Time 6    Period Weeks    Status Achieved      PT LONG TERM GOAL #4   Title Pt will be able to ambulate throughout the clinic without an AD and without the need for PT supervision during an entire session.    Time 6    Period Weeks    Status Achieved      PT LONG TERM GOAL #5   Title Pt will complete TUG with LRAD in less than 14 sec to reflect improvements in her mobility and safety with short walking distances.    Baseline 12  sec without AD    Time 6    Period Weeks    Status Achieved                   Plan - 12/07/20 1048     Clinical Impression Statement Patient will be discharged at this time.             Patient will benefit from skilled therapeutic intervention in order to improve the following deficits and impairments:     Visit Diagnosis: Unsteadiness on feet  Muscle weakness (generalized)  PHYSICAL THERAPY DISCHARGE SUMMARY  Visits from Start of Care:  21  Current functional level related to goals / functional outcomes: See above   Remaining deficits: See above   Education / Equipment: See plan   Patient agrees to discharge. Patient goals were met. Patient is being discharged due to being pleased with the current functional level.    Problem List Patient Active Problem List   Diagnosis Date Noted   Mild obstructive sleep apnea 10/23/2020   Excessive daytime sleepiness 07/25/2020   RLS (restless legs syndrome) 07/25/2020   Intermittent palpitations 07/25/2020   Nonrheumatic aortic valve insufficiency    Bradycardia 11/13/2019   Mitral regurgitation and aortic stenosis 11/12/2019   Severe mitral regurgitation 11/12/2019   SVT (supraventricular tachycardia) ([place]) 11/12/2019   Exertional dyspnea 10/02/2019   Overweight (BMI 25.0-29.9) 10/14/2017   BMI 27.0-27.9,adult 10/14/2017   Balance problem 10/14/2017   Neuropathic pain 09/03/2016   Bilateral nonexudative age-related macular degeneration 09/03/2016   Generalized osteoarthritis of multiple sites 05/07/2013   Hypothyroidism 05/07/2013   Cyst of thyroid 05/07/2013   Hyperhidrosis 05/07/2013   Essential hypertension, benign 05/07/2013   Cough 01/31/2012    Isabel Caprice, PT 12/07/2020, 10:59 AM  [facility] @ [address] [place], [place], [address] Phone: [phone]   Fax:  [phone]  Name: Christie Viscomi MRN: [id number] Date of Birth: [birth year]

## 2020-12-15 ENCOUNTER — Other Ambulatory Visit: Payer: Self-pay

## 2020-12-15 ENCOUNTER — Ambulatory Visit: Payer: Medicare PPO | Admitting: Cardiology

## 2020-12-15 ENCOUNTER — Encounter: Payer: Self-pay | Admitting: Cardiology

## 2020-12-15 VITALS — BP 157/64 | HR 66 | Resp 16 | Ht 63.0 in | Wt 150.2 lb

## 2020-12-15 DIAGNOSIS — I10 Essential (primary) hypertension: Secondary | ICD-10-CM

## 2020-12-15 DIAGNOSIS — I471 Supraventricular tachycardia: Secondary | ICD-10-CM

## 2020-12-15 DIAGNOSIS — Z87891 Personal history of nicotine dependence: Secondary | ICD-10-CM

## 2020-12-15 DIAGNOSIS — I350 Nonrheumatic aortic (valve) stenosis: Secondary | ICD-10-CM

## 2020-12-15 DIAGNOSIS — R0609 Other forms of dyspnea: Secondary | ICD-10-CM

## 2020-12-15 MED ORDER — METOPROLOL SUCCINATE ER 25 MG PO TB24: ORAL_TABLET | ORAL | 1 refills | Status: DC

## 2020-12-15 MED ORDER — LOSARTAN POTASSIUM 100 MG PO TABS: 100.0000 mg | ORAL_TABLET | Freq: Every morning | ORAL | 1 refills | Status: DC

## 2020-12-15 NOTE — Progress Notes (Signed)
ID:  Donna [name], DOB [birth year], MRN [id number]  PCP:  [name], NP  Cardiologist:  Rex Kras, DO, [facility] (established care 08/26/2019)  Date: 12/15/20 Last Office Visit: 11/17/2020   Chief Complaint  Patient presents with   Hypertension   Follow-up    HPI  Donna [name] is a 87 y.o. female who presents to the office with a chief complaint of " hypertension." Patient's past medical history and cardiovascular risk factors include: hypertension, mild OSA, supraventricular tachycardia, moderate aortic stenosis, mild MR, migraine, GI bleeding, postmenopausal female, advanced age.  Patient was referred to the office for evaluation of palpitations.  After an extensive cardiovascular work-up with the patient was noted to have SVT and was seen by Dr. Lovena Le from cardiac electrophysiology and underwent an EP study as well as radiofrequency ablation of her slow pathway.  Since then symptomatically the patient has improved.  During initial work-up she was also noted to have aortic stenosis per surface echocardiogram; however, angiography there is no significant gradient between the left ventricle and aorta on pullback.  Clinically she has remained stable and has not experienced any anginal discomfort, syncope, or heart failure symptoms.  During prior office visits patient was having difficulty with managing blood pressures as her heart rate would be less than 60 bpm and would hold carvedilol.  At the last office visit her carvedilol was changed to metoprolol succinate and patient was also started on hydralazine.  Since then patient states that her home blood pressures are very well controlled.  Systolic blood pressures now range between 130-140 mmHg.  She has also noticed improvement in her shortness of breath with effort related activities.  Morning blood pressures are still higher than evening blood pressures. She has mild OSA during REM sleep and it is positionally dependent.  Currently  not on CPAP.  Patient denies any chest pain or anginal discomfort.  Overall euvolemic and not in congestive heart failure.  No hospitalizations or urgent care visits.  FUNCTIONAL STATUS: No structured exercise program or daily routine. 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    ALLERGIES: No Known Allergies  MEDICATION LIST PRIOR TO VISIT: Current Meds  Medication Sig   Blood Pressure Monitoring KIT Essential hypertension check bp daily   Calcium Carbonate-Vitamin D 600-400 MG-UNIT per tablet Take 1 tablet by mouth in the morning and at bedtime.   gabapentin (NEURONTIN) 250 MG/5ML solution Take 1 mL (50 mg total) by mouth at bedtime.   hydrALAZINE (APRESOLINE) 10 MG tablet TAKE 1 TABLET(10 MG) BY MOUTH THREE TIMES DAILY   levothyroxine (SYNTHROID) 100 MCG tablet TAKE 1 TABLET BY MOUTH EVERY DAY 30 MINUTES BEFORE BREAKFAST ON AN EMPTY STOMACH   Light Mineral Oil-Mineral Oil (RETAINE MGD) 0.5-0.5 % EMUL Place 1 drop into both eyes in the morning and at bedtime.   Multiple Vitamins-Minerals (PRESERVISION AREDS 2) CAPS Take 1 capsule by mouth 2 (two) times a day.    OVER THE COUNTER MEDICATION Take 2 capsules by mouth daily. MitoQ   vitamin B-12 (CYANOCOBALAMIN) 1000 MCG tablet Take 1 tablet (1,000 mcg total) by mouth daily.   [DISCONTINUED] losartan (COZAAR) 100 MG tablet TAKE 1 TABLET(100 MG) BY MOUTH DAILY   [DISCONTINUED] metoprolol succinate (TOPROL-XL) 25 MG 24 hr tablet TAKE 1 TABLET BY MOUTH EVERY MORNING. HOLD IF SYSTOLIC BLOOD PRESSURE(TOP NUMBER) LESS THAN 100 OR PULSE LESS THAN 55 BPM     PAST MEDICAL HISTORY: Past Medical History:  Diagnosis Date   Aortic stenosis    Hemorrhage of gastrointestinal tract, unspecified    Hypertension    Migraine    Mitral regurgitation    OA (osteoarthritis)    Thyroid cyst     PAST SURGICAL HISTORY: Past Surgical History:  Procedure Laterality Date   CATARACT EXTRACTION  1990   rt   CATARACT EXTRACTION   2005   left   LEFT HEART CATH AND CORONARY ANGIOGRAPHY N/A 10/06/2019   Procedure: LEFT HEART CATH AND CORONARY ANGIOGRAPHY;  Surgeon: Nigel Mormon, MD;  Location: [place] [facility];  Service: Cardiovascular;  Laterality: N/A;   REVISION TOTAL KNEE ARTHROPLASTY  2008   SVT ABLATION N/A 01/18/2020   Procedure: SVT ABLATION;  Surgeon: Evans Lance, MD;  Location: [place] [facility];  Service: Cardiovascular;  Laterality: N/A;   THUMB ARTHROSCOPY     TONSILLECTOMY      FAMILY HISTORY: The patient family history includes Arthritis in her sister; Brain cancer in her brother; CVA (age of onset: 104) in her sister; CVA (age of onset: 60) in her father; Heart disease in her sister; Pancreatic cancer in her mother.  SOCIAL HISTORY:  The patient  reports that she quit smoking about 72 years ago. Her smoking use included cigarettes. She has never used smokeless tobacco. She reports that she does not currently use alcohol. She reports that she does not use drugs.  REVIEW OF SYSTEMS: Review of Systems  Constitutional: Negative for chills and fever.  HENT:  Negative for hoarse voice and nosebleeds.   Eyes:  Negative for discharge, double vision and pain.  Cardiovascular:  Positive for dyspnea on exertion (improved). Negative for chest pain, claudication, leg swelling, near-syncope, orthopnea, palpitations, paroxysmal nocturnal dyspnea and syncope.  Respiratory:  Negative for hemoptysis and shortness of breath.   Musculoskeletal:  Negative for muscle cramps and myalgias.  Gastrointestinal:  Negative for abdominal pain, constipation, diarrhea, hematemesis, hematochezia, melena, nausea and vomiting.  Neurological:  Negative for dizziness, light-headedness and vertigo.   PHYSICAL EXAM: Vitals with BMI 12/15/2020 11/17/2020 11/17/2020  Height 5' 3"  - 5' 3"   Weight 150 lbs 3 oz - 150 lbs 3 oz  BMI 44.81 - 85.63  Systolic 149 702 637  Diastolic 64 61 63  Pulse 66 60 58   CONSTITUTIONAL:  Well-developed and well-nourished. No acute distress.  SKIN: Skin is warm and dry. No rash noted. No cyanosis. No pallor. No jaundice HEAD: Normocephalic and atraumatic.  EYES: No scleral icterus MOUTH/THROAT: Moist oral membranes.  NECK: No JVD present. No thyromegaly noted.  Bilateral carotid bruits most likely secondary to delayed carotid upstrokes LYMPHATIC: No visible cervical adenopathy.  CHEST Normal respiratory effort. No intercostal retractions  LUNGS: Clear to auscultation bilaterally.  No stridor. No wheezes. No rales.  CARDIOVASCULAR: Regular, positive S1 delayed S2, 3 out of 6 crescendo decrescendo murmur heard at the second right intercostal space, with a component of holosystolic murmur at the apex. ABDOMINAL: Soft, nontender, nondistended, no apparent ascites.  EXTREMITIES: Trace bilateral peripheral edema.  HEMATOLOGIC: No significant bruising NEUROLOGIC: Oriented to person, place, and time. Nonfocal. Normal muscle tone.  PSYCHIATRIC: Normal mood and affect. Normal behavior. Cooperative  CARDIAC DATABASE: EKG: 11/17/2020: Sinus bradycardia, 57 bpm,  nonspecific ST depressions.  Echocardiogram: 03/24/2020: 1. Left ventricular ejection fraction, by estimation, is 60 to 65%. The left ventricle has normal function. The left ventricle has no regional wall motion abnormalities. There is mild left ventricular hypertrophy. Left ventricular diastolic parameters  were  normal. 2. Right ventricular systolic function is normal. The right ventricular size is normal. There is normal pulmonary artery systolic pressure. 3. The mitral valve is degenerative. Mild mitral valve regurgitation. No evidence of mitral stenosis. 4. The aortic valve is calcified. Aortic valve regurgitation is mild. Moderate aortic valve stenosis. Aortic valve mean gradient measures 25.0 mmHg. Aortic valve Vmax measures 3.12 m/s. 5. The inferior vena cava is normal in size with greater than 50% respiratory variability,  suggesting right atrial pressure of 3 mmHg.  Stress Testing: None  Heart Catheterization: 10/06/2019: LM: Normal LAD: Mild luminal irregularities LCx: Normal RCA: Normal   LVEDP 14 mmHg 60 mmHg mid-cavitary gradient on pullback No significant LV-Ao gradient on pullback  Carotid artery duplex  09/09/2019:  Minimal stenosis in the right internal carotid artery (minimal).  Minimal stenosis in the left internal carotid artery (minimal).  Minimal plaque noted in the CCA bilaterally without significant stenosis.  Antegrade right vertebral artery flow. Antegrade left vertebral artery flow.  Follow up studies if clinically indicated.  7 day extended Holter monitor: Predominantly normal sinus rhythm (NSR).  HR 40-210 bpm.  Avg HR 79 bpm. Minimum HR 40 bpm on 08/29/19 at 1:02pm sinus with junctional escape. No atrial fibrillation/NSVT/pause (3 secs or longer). Total ventricular ectopic burden <1%. Total supraventricular ectopic burden <1%. 42 episodes of SVT.  Fastest and longest run was 1hr 17 mins with max HR 210 bpm (avg 155 bpm). Patient triggered events: 6.  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   LABORATORY DATA: CBC Latest Ref Rng & Units 04/22/2020 01/11/2020 11/13/2019  WBC 3.8 - 10.8 Thousand/uL 6.2 6.2 9.1  Hemoglobin 11.7 - 15.5 g/dL 13.6 12.6 11.3(L)  Hematocrit 35.0 - 45.0 % 42.3 37.0 35.1(L)  Platelets 140 - 400 Thousand/uL 195 187 159    CMP Latest Ref Rng & Units 03/22/2020 02/26/2020 01/11/2020  Glucose 65 - 99 mg/dL 99 105(H) 73  BUN 10 - 36 mg/dL 13 16 20   Creatinine 0.57 - 1.00 mg/dL 0.77 0.83 0.81  Sodium 134 - 144 mmol/L 137 141 137  Potassium 3.5 - 5.2 mmol/L 5.0 4.5 4.6  Chloride 96 - 106 mmol/L 100 102 99  CO2 20 - 29 mmol/L 24 23 22   Calcium 8.7 - 10.3 mg/dL 9.8 10.2 9.8  Total Protein 6.5 - 8.1 g/dL - - -  Total Bilirubin 0.3 - 1.2 mg/dL - - -  Alkaline Phos 38 - 126 U/L - - -  AST 15 - 41  U/L - - -  ALT 0 - 44 U/L - - -    Lipid Panel  Lab Results  Component Value Date   CHOL 112 12/08/2018   HDL 34 (L) 12/08/2018   LDLCALC 62 12/08/2018   TRIG 80 12/08/2018   CHOLHDL 3.3 12/08/2018    No components found for: NTPROBNP No results for input(s): PROBNP in the last 8760 hours. Recent Labs    04/22/20 1411  TSH 3.78    BMP Recent Labs    01/11/20 1007 02/26/20 1451 03/22/20 1115  NA 137 141 137  K 4.6 4.5 5.0  CL 99 102 100  CO2 22 23 24   GLUCOSE 73 105* 99  BUN 20 16 13   CREATININE 0.81 0.83 0.77  CALCIUM 9.8 10.2 9.8  GFRNONAA 64 62  --   GFRAA 74 72  --     HEMOGLOBIN A1C Lab Results  Component Value Date   HGBA1C 5.1 08/22/2016  MPG 100 08/22/2016    IMPRESSION:    ICD-10-CM   1. Essential hypertension, benign  I10 losartan (COZAAR) 100 MG tablet    2. Paroxysmal SVT (supraventricular tachycardia) (HCC)  I47.1 metoprolol succinate (TOPROL-XL) 25 MG 24 hr tablet    3. Nonrheumatic aortic valve stenosis  I35.0 metoprolol succinate (TOPROL-XL) 25 MG 24 hr tablet    ECHOCARDIOGRAM COMPLETE    4. Dyspnea on exertion  R06.09     5. Former smoker  Z87.891          RECOMMENDATIONS: Analie Katzman is a 87 y.o. female whose past medical history and cardiac risk factors include: hypertension, mild OSA, supraventricular tachycardia, moderate aortic stenosis, mild MR, migraine, GI bleeding, postmenopausal female, advanced age.  Essential hypertension, benign Patient requested assistance with blood pressure management in the past. Office blood pressures are not within acceptable range. Home blood pressures have improved per patient.  Medications reconciled. Norvasc - d/c in the past due to LE swelling  Tolerated metoprolol and hydralazine well since last office visit.  Patient requesting refills. Monitor for now  SVT (supraventricular tachycardia) (HCC) Underwent EP study and radiofrequency ablation of the slow pathway by Dr. Lovena Le. Has  remained stable since ablation. Refill Toprol-XL 25 mg p.o. daily. Monitor for now  Nonrheumatic aortic valve stenosis Echo notes the severity of aortic stenosis to be moderate. No significant gradient on pullback between the LV/AO during invasive angiography. Denies chest pain, syncope, or heart failure symptoms.  If such symptoms arise patient is asked to seek medical attention by going to the closest ED via EMS. Echo May 2023 Will continue to monitor.  Dyspnea on exertion Improving Multifactorial: Underlying valvular heart disease, allergies, and hypertension. Valvular heart disease remains relatively stable. Blood pressure management as discussed above. Overall euvolemic and not in congestive heart failure. Continue to monitor from a cardiovascular standpoint  Former smoker Educated on the importance of continued smoking cessation.    FINAL MEDICATION LIST END OF ENCOUNTER: Meds ordered this encounter  Medications   metoprolol succinate (TOPROL-XL) 25 MG 24 hr tablet    Sig: TAKE 1 TABLET BY MOUTH EVERY MORNING. HOLD IF SYSTOLIC BLOOD PRESSURE(TOP NUMBER) LESS THAN 100 OR PULSE LESS THAN 55 BPM    Dispense:  90 tablet    Refill:  1    **Patient requests 90 days supply**   losartan (COZAAR) 100 MG tablet    Sig: Take 1 tablet (100 mg total) by mouth every morning.    Dispense:  90 tablet    Refill:  1    **Patient requests 90 days supply**      Medications Discontinued During This Encounter  Medication Reason   losartan (COZAAR) 100 MG tablet Reorder   metoprolol succinate (TOPROL-XL) 25 MG 24 hr tablet Reorder    Current Outpatient Medications:    Blood Pressure Monitoring KIT, Essential hypertension check bp daily, Disp: 1 kit, Rfl: 0   Calcium Carbonate-Vitamin D 600-400 MG-UNIT per tablet, Take 1 tablet by mouth in the morning and at bedtime., Disp: , Rfl:    gabapentin (NEURONTIN) 250 MG/5ML solution, Take 1 mL (50 mg total) by mouth at bedtime., Disp: 30 mL,  Rfl: 5   hydrALAZINE (APRESOLINE) 10 MG tablet, TAKE 1 TABLET(10 MG) BY MOUTH THREE TIMES DAILY, Disp: 270 tablet, Rfl: 0   levothyroxine (SYNTHROID) 100 MCG tablet, TAKE 1 TABLET BY MOUTH EVERY DAY 30 MINUTES BEFORE BREAKFAST ON AN EMPTY STOMACH, Disp: 90 tablet, Rfl: 1   Light Mineral  Oil-Mineral Oil (RETAINE MGD) 0.5-0.5 % EMUL, Place 1 drop into both eyes in the morning and at bedtime., Disp: , Rfl:    Multiple Vitamins-Minerals (PRESERVISION AREDS 2) CAPS, Take 1 capsule by mouth 2 (two) times a day. , Disp: , Rfl:    OVER THE COUNTER MEDICATION, Take 2 capsules by mouth daily. MitoQ, Disp: , Rfl:    vitamin B-12 (CYANOCOBALAMIN) 1000 MCG tablet, Take 1 tablet (1,000 mcg total) by mouth daily., Disp: 30 tablet, Rfl: 5   losartan (COZAAR) 100 MG tablet, Take 1 tablet (100 mg total) by mouth every morning., Disp: 90 tablet, Rfl: 1   metoprolol succinate (TOPROL-XL) 25 MG 24 hr tablet, TAKE 1 TABLET BY MOUTH EVERY MORNING. HOLD IF SYSTOLIC BLOOD PRESSURE(TOP NUMBER) LESS THAN 100 OR PULSE LESS THAN 55 BPM, Disp: 90 tablet, Rfl: 1  Orders Placed This Encounter  Procedures   ECHOCARDIOGRAM COMPLETE   There are no Patient Instructions on file for this visit.   --Continue cardiac medications as reconciled in final medication list. --Return in about 6 months (around 06/15/2021) for Follow up hx of SVT and HTN. Or sooner if needed. --Continue follow-up with your primary care physician regarding the management of your other chronic comorbid conditions.  Patient's questions and concerns were addressed to her satisfaction. She voices understanding of the instructions provided during this encounter.   This note was created using a voice recognition software as a result there may be grammatical errors inadvertently enclosed that do not reflect the nature of this encounter. Every attempt is made to correct such errors.  Rex Kras, [place], [facility]  Pager: [phone] Office: [phone]

## 2020-12-18 ENCOUNTER — Encounter: Payer: Self-pay | Admitting: Cardiology

## 2021-01-19 ENCOUNTER — Ambulatory Visit: Payer: Medicare PPO | Admitting: Neurology

## 2021-01-19 ENCOUNTER — Encounter: Payer: Self-pay | Admitting: Neurology

## 2021-01-19 ENCOUNTER — Other Ambulatory Visit: Payer: Self-pay

## 2021-01-19 VITALS — BP 138/61 | HR 65 | Ht 63.0 in | Wt 150.0 lb

## 2021-01-19 DIAGNOSIS — I340 Nonrheumatic mitral (valve) insufficiency: Secondary | ICD-10-CM

## 2021-01-19 DIAGNOSIS — M48061 Spinal stenosis, lumbar region without neurogenic claudication: Secondary | ICD-10-CM | POA: Diagnosis not present

## 2021-01-19 DIAGNOSIS — G2581 Restless legs syndrome: Secondary | ICD-10-CM

## 2021-01-19 DIAGNOSIS — G4733 Obstructive sleep apnea (adult) (pediatric): Secondary | ICD-10-CM | POA: Diagnosis not present

## 2021-01-19 DIAGNOSIS — I080 Rheumatic disorders of both mitral and aortic valves: Secondary | ICD-10-CM

## 2021-01-19 DIAGNOSIS — I351 Nonrheumatic aortic (valve) insufficiency: Secondary | ICD-10-CM

## 2021-01-19 DIAGNOSIS — I471 Supraventricular tachycardia: Secondary | ICD-10-CM

## 2021-01-19 DIAGNOSIS — R208 Other disturbances of skin sensation: Secondary | ICD-10-CM | POA: Diagnosis not present

## 2021-01-19 MED ORDER — MAGNESIUM 30 MG PO TABS: ORAL_TABLET | ORAL | 5 refills | Status: DC

## 2021-01-19 NOTE — Progress Notes (Signed)
SLEEP MEDICINE CLINIC    Provider:  Larey Seat, MD  Primary Care Physician:  [name], NP [place] [address]     Referring Provider: Terri Skains, DO          Chief Complaint according to patient   Patient presents with:     New Patient (Initial Visit)           HISTORY OF PRESENT ILLNESS:  Donna [name] is a 88 -year old Caucasian female patient who is seen here in a RV on 01-19-2021. She underwent a home sleep test on 09-28-2020 after initial referral by Dr. Linwood Dibbles of [facility] cardiovascular services.  The patient had endorsed the Epworth sleepiness score at 11 points fatigue severity was high at 48 points she has a body mass index of 26 her home sleep test showed mild obstructive sleep apnea the overall AHI of the night being only 11.3/h there was a strong REM sleep dependency but also positional dependency.  Interesting is that when the patient had 1 REM sleep phase while sleeping on her right side no significant apnea resulted so I think that positional therapy is indicated.  I offered the option of CPAP but she would prefer rather not to use it and since she does not have hypoxemia that should be checked just fine.  I would like for her to sleep on her right or left side better not prone as this can cause also airway obstructions and we can also discuss the tennis ball method here today, there is a device also available the [facility] sleep balance belt which will wake patients that have rolled onto the back.   However it 's cost of several $100 is not covered by insurance and I do not think that it helps in any way more than a tennis ball.  when restless leg concern was addressed during last visit we looked through different metabolic panels, she has lost the sense of vibration in both feet so there is clearly some neuropathy, but what I am more concerned about is that 50 mg of liquid gabapentin which helped her to sleep have still a grogginess affect  the following morning so she is definitely sensitive to this medication and would rather get off. She had tried and failed, Lyrica under the guidance of her PCP.  She had tried 100 mg 2 caps at night and that was of course not well tolerated either, She has not experienced anticipation, the symptoms start at bedtime.  Alternatives are magnesium and tonic water for quinine effect.   She has noted severe  neuropathy , burning in her whole Body to be stronger since last epidural injection. This affected her balance , too. Numbness to vibration in patella and feet and all toes.       SEEN 07-25-2020 upon a referral by her cardiologist .  01/19/2021 .  Chief concern according to patient :  I have high BP in the morning and irregular heart beats, get short of breath- my heart doctor wants this evaluated. My health problems started with my husbands death in 07-22-19 with dementia , worsening over a course of 12 years.    I have the pleasure of seeing Donna [name] today, a right -handed Caucasian female with a possible sleep disorder.  She  has a past medical history of Aortic stenosis, Hemorrhage of gastrointestinal tract, unspecified, Hypertension, Hearing loss, Insomnia, Migraine, Mitral regurgitation, OA (osteoarthritis), spinal stenosis ,  DDD, Vit B 12 deficiency, Anemia, SVT, and Thyroid cyst.   Sleep relevant medical history: Nocturia/ Enuresis were denied, Tonsillectomy in childhood- she broke her nose  , history of cervical spine disorder / deviated septum. She reports not sleeping well at night due to leg restlessness, and is sleepy in daytime, excessive daytime sleepiness.     Family medical /sleep history: No other family member on CPAP with OSA, insomnia, sleep walkers.    Social history:  Patient is retired from Emergency planning/management officer in lab medicine, [place]-  and lives in a household alone. Family status is widowed , with adult children, and  grandchildren. 2 cats are present. Tobacco use: I  smoked in College for about  2 packs total- .  ETOH use - 1 glass of wine once or twice a year-, Caffeine intake in form of Coffee( /) Soda( /) Tea ( sometimes ) or energy drinks. Regular exercise in form of  walking - but I don't have the energy and  I am afraid of falling. .   Hobbies : reading - but I have macular degeneration- reading large print       Sleep habits are as follows: The patient's dinner time is between 6 PM. The patient goes to bed at 10.30 PM and has RLS- continues to sleep for intervals of 1-2 hours, wakes for 1 bathroom break, and RLS makes return to sleep difficult. the first time at 1 AM.   The preferred sleep position is sideways or supine, with the support of an adjustable bed, but raising it makes the neck pain worse. -1 pillow.  Dreams are reportedly frequently.  7 AM is the usual rise time. The patient wakes up spontaneously at 5 AM- She reports not feeling refreshed or restored in AM, with symptoms such as dry mouth, no morning headaches, and residual fatigue.  Naps are taken frequently when she spends time in her recliner- especially in front of the Tv-  lasting from 30 to 60 minutes and are not more refreshing than nocturnal sleep.    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    How likely are you to doze in the following situations: 0 = not likely, 1 = slight chance, 2 = moderate chance, 3 = high chance   Sitting and Reading? Watching Television? Sitting inactive in a public place (theater or meeting)? As a passenger in a car for an hour without a break? Lying down in the afternoon when circumstances permit? Sitting and talking to someone? Sitting quietly after lunch without alcohol? In a car, while stopped for a  few minutes in traffic?   Total = 11 / 24 points   FSS endorsed at 48/ 63 points. Highly fatigued.   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   Vaping Use: Never used  Substance and Sexual Activity   Alcohol use: Not Currently    Comment: rarely has a glass of wine   Drug use: No   Sexual activity: Not on file  Other Topics Concern   Not on file  Social History Narrative   Right handed   Decaf coffee, tea sometimes   Lives alone   Social Determinants of Health   Financial Resource Strain: Not on file  Food Insecurity: Not on file  Transportation Needs: Not on file  Physical Activity: Not on file  Stress: Not on file  Social Connections: Not on file    Family History  Problem Relation Age of Onset   CVA Father 26   Pancreatic cancer Mother    CVA Sister 7   Brain cancer Brother    Arthritis Sister    Heart disease Sister    Breast cancer Neg Hx     Past Medical History:  Diagnosis Date   Aortic stenosis    Hemorrhage of gastrointestinal tract, unspecified    Hypertension    Migraine    Mitral regurgitation    OA (osteoarthritis)    Thyroid cyst     Past Surgical History:  Procedure Laterality Date   CATARACT EXTRACTION  1990   rt   CATARACT EXTRACTION  2005   left   LEFT HEART CATH AND CORONARY ANGIOGRAPHY N/A 10/06/2019   Procedure: LEFT HEART CATH AND CORONARY ANGIOGRAPHY;  Surgeon: Nigel Mormon, MD;  Location: [facility];  Service: Cardiovascular;  Laterality: N/A;   REVISION TOTAL KNEE ARTHROPLASTY  2008   SVT ABLATION N/A  01/18/2020   Procedure: SVT ABLATION;  Surgeon: Evans Lance, MD;  Location: [facility];  Service: Cardiovascular;  Laterality: N/A;   THUMB ARTHROSCOPY     TONSILLECTOMY       Current Outpatient Medications on File Prior to Visit  Medication Sig Dispense Refill   Blood Pressure Monitoring KIT Essential hypertension check bp daily 1 kit 0   Calcium Carbonate-Vitamin D 600-400 MG-UNIT per tablet Take 1 tablet by mouth in the morning and at bedtime.     gabapentin (NEURONTIN) 250 MG/5ML solution Take 1 mL (50 mg total) by mouth at bedtime. 30 mL 5   hydrALAZINE (APRESOLINE) 10 MG tablet TAKE 1 TABLET(10 MG) BY MOUTH THREE TIMES DAILY 270 tablet 0   levothyroxine (SYNTHROID) 100 MCG tablet TAKE 1 TABLET BY MOUTH EVERY DAY 30 MINUTES BEFORE BREAKFAST ON AN EMPTY STOMACH 90 tablet 1   Light Mineral Oil-Mineral Oil (RETAINE MGD) 0.5-0.5 % EMUL Place 1 drop into both eyes in the morning and at bedtime.     losartan (COZAAR) 100 MG tablet Take 1 tablet (100 mg total) by mouth every morning. 90 tablet 1   metoprolol succinate (TOPROL-XL) 25 MG 24 hr tablet TAKE 1 TABLET BY MOUTH EVERY MORNING. HOLD IF SYSTOLIC BLOOD PRESSURE(TOP NUMBER) LESS THAN 100 OR PULSE LESS THAN 55 BPM 90 tablet 1   Multiple Vitamins-Minerals (PRESERVISION AREDS 2) CAPS Take 1 capsule by mouth 2 (two) times a day.      OVER THE COUNTER MEDICATION Take 2 capsules by mouth daily. MitoQ     vitamin B-12 (CYANOCOBALAMIN) 1000 MCG tablet Take 1 tablet (1,000 mcg total) by mouth daily. 30 tablet 5   No current facility-administered medications on file prior to visit.    No Known Allergies  Physical exam:  Today's Vitals   01/19/21 0924  BP: 138/61  Pulse: 65  Weight: 150 lb (68 kg)  Height: _0  (1.6 m)   Body mass index is 26.57 kg/m.   Wt Readings from Last 3 Encounters:  01/19/21 150 lb (68 kg)  12/15/20 150 lb 3.2 oz (68.1 kg)  11/17/20 150 lb 3.2 oz (68.1 kg)     Ht Readings from Last 3 Encounters:   01/19/21 _1  (1.6 m)  12/15/20 _2  (1.6 m)  11/17/20 _3  (1.6 m)   The patient gave me insight into her BP log book- she often wakes with HTN, 088P systolic in AM>    General: The patient is awake, alert and appears not in acute distress. The patient is well groomed. Head: Normocephalic, atraumatic. Neck is supple. Mallampati 2,  neck circumference: inches . Nasal airflow restricted patent.  Retrognathia is  seen.  Dental status: biological teeth.  Cardiovascular:  Regular rate and cardiac rhythm by pulse,  without distended neck veins. Respiratory: Lungs are clear to auscultation.  Skin:  Without evidence of ankle edema, or rash. Trunk: The patient's posture is erect.   Neurologic exam : The patient is awake and alert, oriented to place and time.   Memory subjective described as intact.  Attention span & concentration ability appears normal.  Speech is fluent,  without  dysarthria, dysphonia or aphasia.  Mood and affect are appropriate.   Cranial nerves: no loss of smell or taste reported  Pupils are equal and briskly reactive to light. Funduscopic exam - macular degeneration- dry form. .  Extraocular movements in vertical and horizontal planes were intact and without nystagmus. No Diplopia. Visual fields by finger perimetry are intact, but there is a central blind spot. Hearing was impaired.     Facial sensation intact to fine touch.  Facial motor strength is symmetric and tongue and uvula move midline.  Neck ROM : rotation, tilt and flexion extension were normal for age and shoulder shrug was symmetrical.    Motor exam:  Symmetric bulk, tone and ROM.   Normal tone without cog wheeling, symmetric grip strength .   Sensory:  Fine touch and vibration were tested  and  absent in knee, ankles and feet , present in upper extremities.  Severe, profound absence of fine touch and vibration.  Feet hurt. Burning sensation can affect the whole body Proprioception tested in the upper  extremities was normal.   Coordination: Rapid alternating movements in the fingers/hands were of normal speed.  The Finger-to-nose maneuver was intact without evidence of ataxia, dysmetria or tremor.    Gait and station: Patient could rise unassisted from a seated position, walked with a 4 prong cane  assistive device.  She attributes her gait to a spinal stenosis, DDD.  Stance is of wider base and the patient turned with 5 steps.  Toe and heel walk were deferred. She reports claudication, neurogenic. She has to rest after a short walk Deep tendon reflexes: in the  upper and lower extremities are symmetric and intact. Patella reflexes intact - remarkably , since NP is so profound. Must be spinal stenosis. She reports feeling unable to hold herself up. Babinski response was deferred.       After spending a total time of 40  minutes face to face and additional time for physical and neurologic examination, review of laboratory studies,  personal review of imaging studies, reports and results of other testing and review of referral information /  records as far as provided in visit, I have established the following assessments:  10 Dyspnea on exertion, SVT, essential hypertension. Not sure if she snores ornot. Ambulates w/ cane. 2) Neurogenic claudication is likely present as well.  3 RLS reported, which can be related to her history of " neuropathy" and she clearly has brisk reflexes but loss of vibration and soft touch in both legs-and feet. Burning sensation, and creepy crawly at night.  4) EDS not related to fragmented sleep - she had a fairly continuous sleep pattern on gabapentin while tested by HST, mild OSA, REM and positional dependent. 5) non rheumatic aortic stenosis. B 12 deficiency and SVT-  6) High AM BP.    My Plan is to proceed with:  1) positional therapy instead f CPAP preferred by patient, we discussed sleep hygiene , tennis ball, sleep balance device. AVOIDING SUPINE SLEEP.   2) her dyseasthesias may be related to injections for spinal stenosis, whole body burning. DR[facility]ette [facility], not on [facility]>  3) neuropathy. her TSGH and Vit B 12 have normal levels now, she is not anemic. I screened e for Ferritin, TIBC, I d/c  low dose Gapapentin. Trial of quinine in form of tonic water, one 10 ounces , can be diet form.  Magnesium at dinner time. 30 mg OTC.    I would like to thank [name], NP  [place],  [place] [address] for allowing me to meet with and to take care of this pleasant patient.   In short, Donna [name] is presenting with Fatigue , RLS and  Neuropathy - and is open to the above named OTC approach, quinine and magnesium.   I plan to follow up either personally or through our NP within 6-8 month.   CC: I will share my notes with Dr Terri Skains. This patient chose positional therapy for treatment of mild OSA.   Electronically signed by: Larey Seat, MD 01/19/2021 9:41 AM  [facility] Board certified by The [employer] of Sleep Medicine and Diplomate of the [employer] of Sleep Medicine. Board certified In Neurology through the [employer], Fellow of the [employer] of Neurology. Medical Director of [employer].

## 2021-01-19 NOTE — Patient Instructions (Signed)
Magnesium Hydroxide chewable tablets °What is this medication? °MAGNESIUM HYDROXIDE (mag NEE zhum  hye DROX ide) is a laxative and an antacid. It is used to treat constipation. It is also used to treat acid indigestion, sour stomach, and heartburn. °This medicine may be used for other purposes; ask your health care provider or pharmacist if you have questions. °COMMON BRAND NAME(S): Dulcolax, Fleet, Phillips Laxative, Phillips Milk of Magnesia °What should I tell my care team before I take this medication? °They need to know if you have any of these conditions: °bowel, intestinal, or stomach disease °change in bowel habits for more than 14 days °kidney disease °low magnesium diet °nausea, vomiting °stomach pain or blockage °an unusual or allergic reaction to magnesium hydroxide, other medicines, foods, dyes, or preservatives °pregnant or trying to get pregnant °breast-feeding °How should I use this medication? °Take this medicine by mouth. Chew it completely before swallowing. Follow the directions on the package or prescription label. After taking this medicine, drink a full glass of water. Take your medicine at regular intervals. Do not take your medicine more often than directed. °Talk to your pediatrician regarding the use of this medicine in children. While this medicine may be used in children for selected conditions, precautions do apply. °Overdosage: If you think you have taken too much of this medicine contact a poison control center or emergency room at once. °NOTE: This medicine is only for you. Do not share this medicine with others. °What if I miss a dose? °If you miss a dose, take it as soon as you can. If it is almost time for your next dose, take only that dose. Do not take double or extra doses. °What may interact with this medication? °antibiotics °delavirdine °gabapentin °lactulose °medicines for fungal infections like itraconazole and ketoconazole °medicines for osteoporosis like alendronate,  etidronate, risedronate and tiludronate °medicines for seizures like ethotoin and phenytoin °methenamine °other magnesium-containing antacids, laxatives or supplements °phenothiazines like chlorpromazine, mesoridazine, prochlorperazine, thioridazine °quinidine °rosuvastatin °sodium polystyrene sulfonate °sotalol °vitamin D °This list may not describe all possible interactions. Give your health care provider a list of all the medicines, herbs, non-prescription drugs, or dietary supplements you use. Also tell them if you smoke, drink alcohol, or use illegal drugs. Some items may interact with your medicine. °What should I watch for while using this medication? °Tell your doctor or healthcare professional if your symptoms do not start to get better or if they get worse. Do not treat yourself for constipation with this medicine for more than 1 week. See a doctor if you have black tarry stools, rectal bleeding, or if you feel unusually tired. Do not change to another laxative product without advice. °If you are taking other medicines, leave an interval of at least 2 hours before or after taking this medicine. °To help reduce constipation, drink several glasses of water a day. °What side effects may I notice from receiving this medication? °Side effects that you should report to your doctor or health care professional as soon as possible: °allergic reactions like skin rash, itching or hives, swelling of the face, lips, or tongue °confusion °feeling faint or lightheaded, falls °loss of appetite °nausea, vomiting °rectal bleeding °unusually weak or tired °Side effects that usually do not require medical attention (report to your doctor or health care professional if they continue or are bothersome): °chalky taste °diarrhea °stomach cramps °This list may not describe all possible side effects. Call your doctor for medical advice about side effects. You may report   side effects to FDA at 1-800-FDA-1088. °Where should I keep my  medication? °Keep out of the reach of children. °Store at room temperature between 15 and 30 degrees C (59 and 86 degrees F). Do not freeze. Protect from light and moisture. Throw away any unused medicine after the expiration date. °NOTE: This sheet is a summary. It may not cover all possible information. If you have questions about this medicine, talk to your doctor, pharmacist, or health care provider. °© 2022 [employer] Standard (2007-08-24 00:00:00) ° °

## 2021-01-26 ENCOUNTER — Telehealth: Payer: Self-pay | Admitting: Neurology

## 2021-01-26 NOTE — Telephone Encounter (Signed)
Called and spoke with pt. Relayed Dr. Oliva Bustard recommendation. She verbalized understanding.

## 2021-01-26 NOTE — Telephone Encounter (Signed)
Called pt back. Per OV note from Dr. Vickey Huger, she is recommending 30mg  Mg. However, lowest dose she can find is 200mg . Wondering if it has to be chewable tabs? Aware we will speak w MD and call back latest next week.   Per Dr. , recommending 200mg  (she can take 1/2 tablet of chewable to equal 100mg ). There should be no concerns of this affecting her HR (pt was concerned about this)

## 2021-01-26 NOTE — Telephone Encounter (Signed)
Pt is calling re: the Magnesium tablets suggested for pt to take by Dr Vickey Huger.  Pt has not found any with mg lower than 200.  Pt would like a call to know what Dr Vickey Huger would like for her to do since she has not founf any near 30mg 

## 2021-02-07 ENCOUNTER — Other Ambulatory Visit: Payer: Self-pay | Admitting: Cardiology

## 2021-02-07 DIAGNOSIS — I350 Nonrheumatic aortic (valve) stenosis: Secondary | ICD-10-CM

## 2021-02-07 DIAGNOSIS — I471 Supraventricular tachycardia: Secondary | ICD-10-CM

## 2021-02-12 ENCOUNTER — Other Ambulatory Visit: Payer: Self-pay | Admitting: Cardiology

## 2021-02-12 DIAGNOSIS — I10 Essential (primary) hypertension: Secondary | ICD-10-CM

## 2021-02-20 ENCOUNTER — Ambulatory Visit
Admission: RE | Admit: 2021-02-20 | Discharge: 2021-02-20 | Disposition: A | Discharge status: Home / Self Care | Payer: Medicare PPO | Source: Ambulatory Visit | Attending: Nurse Practitioner | Admitting: Nurse Practitioner

## 2021-02-20 ENCOUNTER — Ambulatory Visit: Payer: Medicare PPO | Admitting: Nurse Practitioner

## 2021-02-20 ENCOUNTER — Other Ambulatory Visit: Payer: Self-pay

## 2021-02-20 ENCOUNTER — Encounter: Payer: Self-pay | Admitting: Nurse Practitioner

## 2021-02-20 VITALS — BP 130/68 | HR 59 | Temp 97.0°F | Ht 63.0 in | Wt 153.0 lb

## 2021-02-20 DIAGNOSIS — R609 Edema, unspecified: Secondary | ICD-10-CM | POA: Diagnosis not present

## 2021-02-20 DIAGNOSIS — R053 Chronic cough: Secondary | ICD-10-CM

## 2021-02-20 DIAGNOSIS — M792 Neuralgia and neuritis, unspecified: Secondary | ICD-10-CM

## 2021-02-20 DIAGNOSIS — E039 Hypothyroidism, unspecified: Secondary | ICD-10-CM | POA: Diagnosis not present

## 2021-02-20 DIAGNOSIS — I10 Essential (primary) hypertension: Secondary | ICD-10-CM

## 2021-02-20 DIAGNOSIS — I351 Nonrheumatic aortic (valve) insufficiency: Secondary | ICD-10-CM

## 2021-02-20 DIAGNOSIS — G2581 Restless legs syndrome: Secondary | ICD-10-CM

## 2021-02-20 DIAGNOSIS — G4733 Obstructive sleep apnea (adult) (pediatric): Secondary | ICD-10-CM

## 2021-02-20 DIAGNOSIS — M159 Polyosteoarthritis, unspecified: Secondary | ICD-10-CM

## 2021-02-20 LAB — COMPLETE METABOLIC PANEL WITH GFR
AG Ratio: 2.4 (calc) (ref 1.0–2.5)
ALT: 15 U/L (ref 6–29)
AST: 13 U/L (ref 10–35)
Albumin: 4 g/dL (ref 3.6–5.1)
Alkaline phosphatase (APISO): 40 U/L (ref 37–153)
BUN: 20 mg/dL (ref 7–25)
CO2: 30 mmol/L (ref 20–32)
Calcium: 10.3 mg/dL (ref 8.6–10.4)
Chloride: 97 mmol/L — ABNORMAL LOW (ref 98–110)
Creat: 0.84 mg/dL (ref 0.60–0.95)
Globulin: 1.7 g/dL (calc) — ABNORMAL LOW (ref 1.9–3.7)
Glucose, Bld: 92 mg/dL (ref 65–139)
Potassium: 5.2 mmol/L (ref 3.5–5.3)
Sodium: 130 mmol/L — ABNORMAL LOW (ref 135–146)
Total Bilirubin: 0.6 mg/dL (ref 0.2–1.2)
Total Protein: 5.7 g/dL — ABNORMAL LOW (ref 6.1–8.1)
eGFR: 66 mL/min/{1.73_m2} (ref >=60)

## 2021-02-20 LAB — CBC WITH DIFFERENTIAL/PLATELET
Absolute Monocytes: 659 cells/uL (ref 200–950)
Basophils Absolute: 49 cells/uL (ref 0–200)
Basophils Relative: 0.9 %
Eosinophils Absolute: 227 cells/uL (ref 15–500)
Eosinophils Relative: 4.2 %
HCT: 38.4 % (ref 35.0–45.0)
Hemoglobin: 13.1 g/dL (ref 11.7–15.5)
Lymphs Abs: 1274 cells/uL (ref 850–3900)
MCH: 30.6 pg (ref 27.0–33.0)
MCHC: 34.1 g/dL (ref 32.0–36.0)
MCV: 89.7 fL (ref 80.0–100.0)
MPV: 10.1 fL (ref 7.5–12.5)
Monocytes Relative: 12.2 %
Neutro Abs: 3191 cells/uL (ref 1500–7800)
Neutrophils Relative %: 59.1 %
Platelets: 172 10*3/uL (ref 140–400)
RBC: 4.28 10*6/uL (ref 3.80–5.10)
RDW: 12.2 % (ref 11.0–15.0)
Total Lymphocyte: 23.6 %
WBC: 5.4 10*3/uL (ref 3.8–10.8)

## 2021-02-20 LAB — TSH: TSH: 2.79 mIU/L (ref 0.40–4.50)

## 2021-02-20 IMAGING — CR DG CHEST 2V
2 series · 2 of 2 positions shown · non-contrast
Comparison: Chest x-ray [DATE].

CLINICAL DATA: Worsening cough.

EXAM:
CHEST - 2 VIEW

[w chest pa]
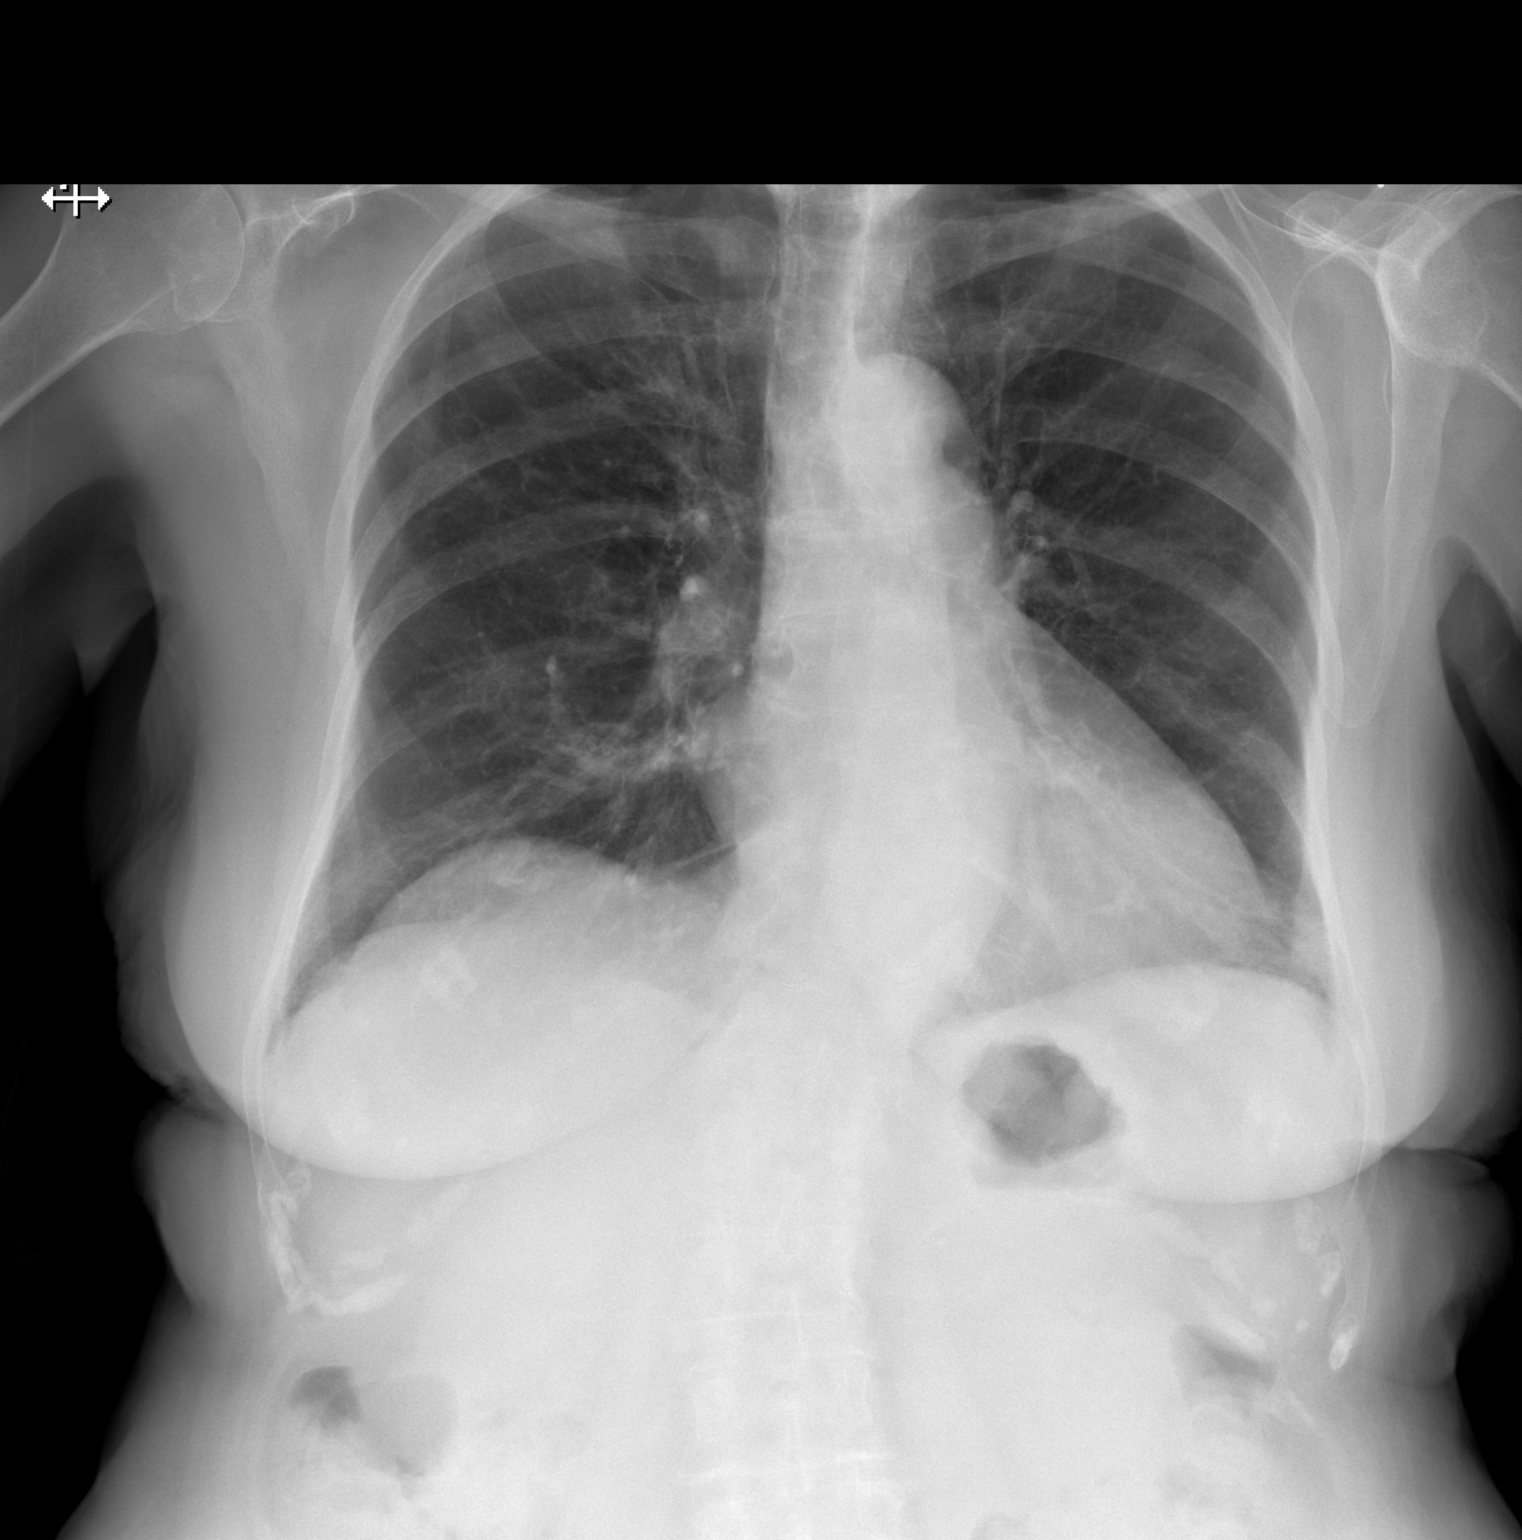

[w chest lat]
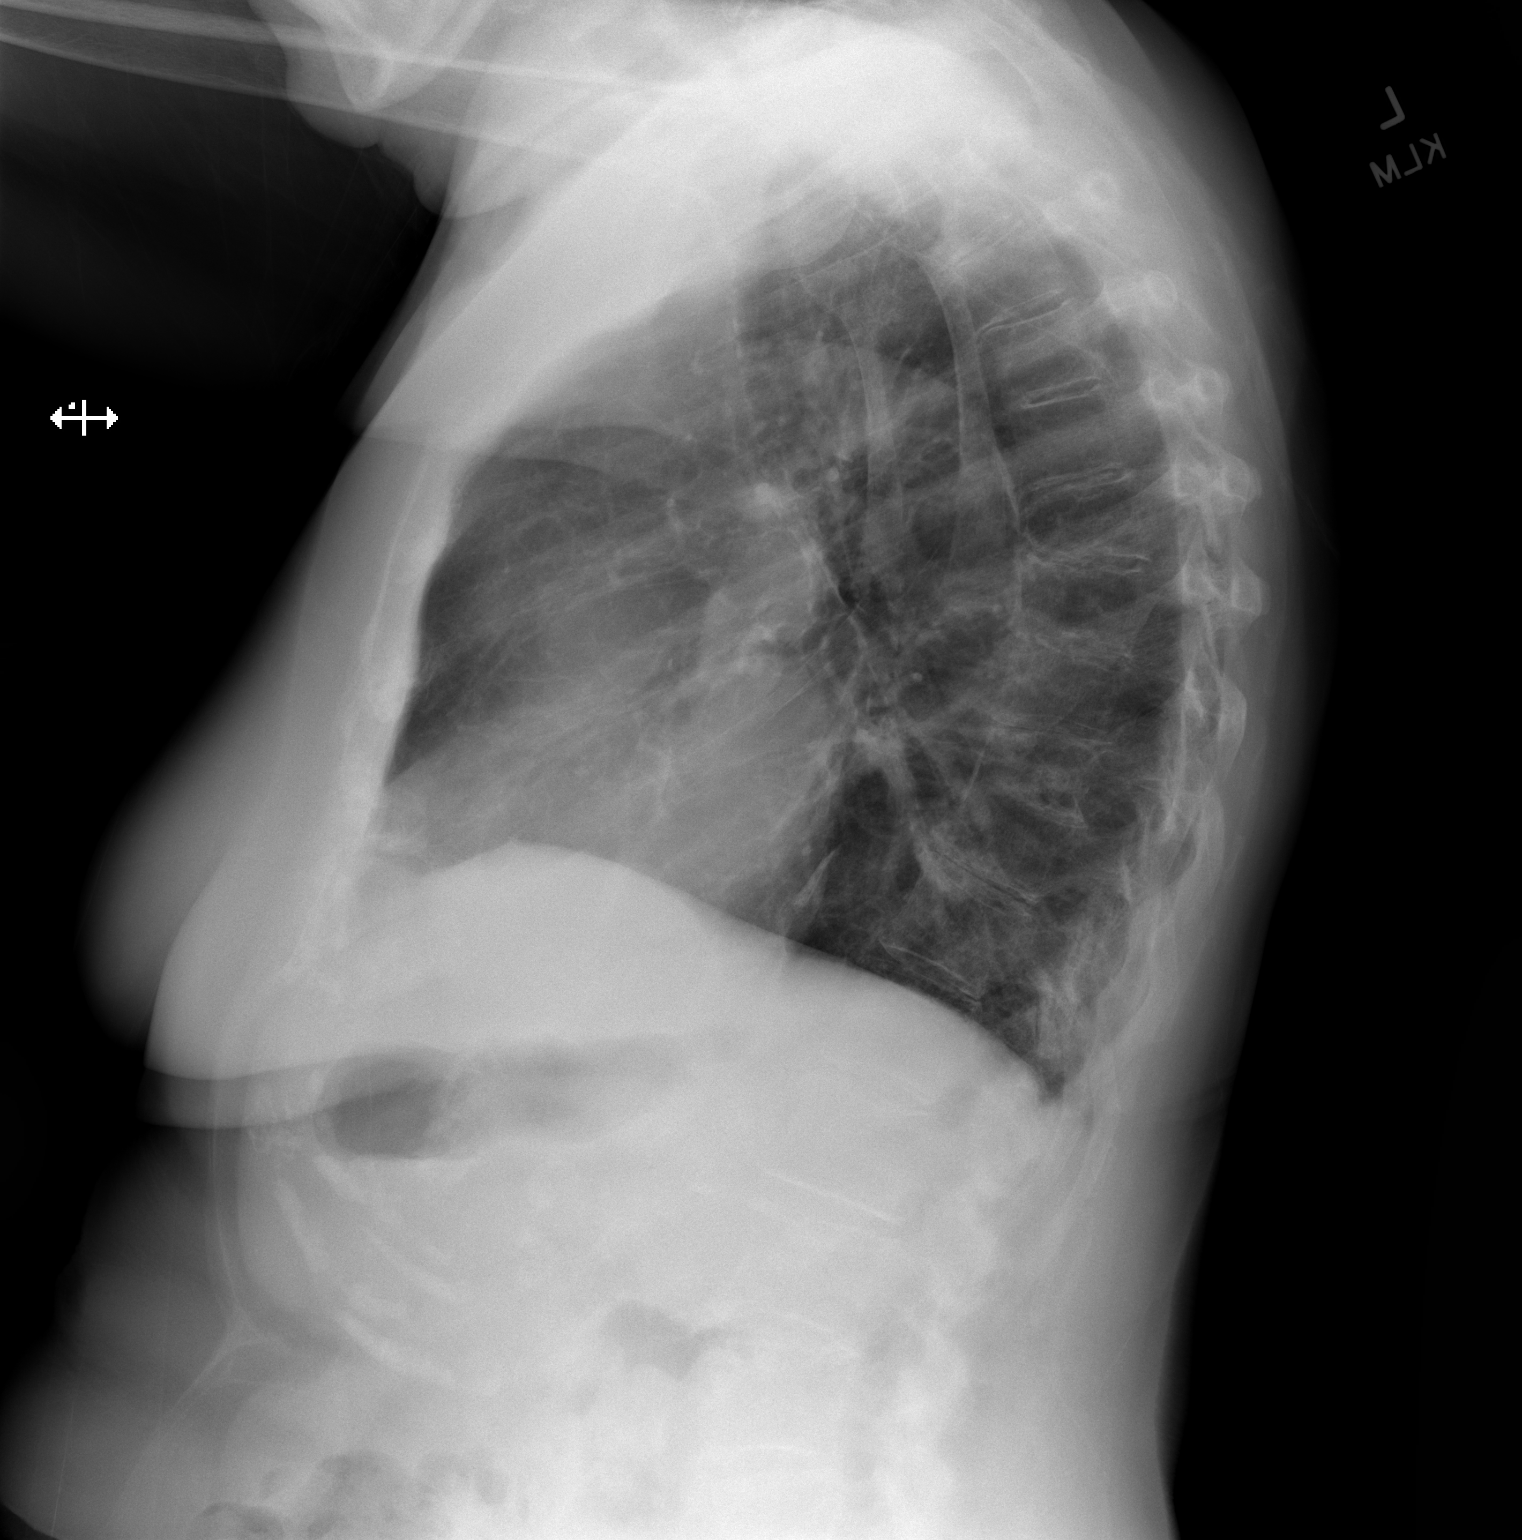

[2 of 2 positions shown; findings below may reference images not displayed]

FINDINGS: There are minimal patchy opacities in left costophrenic angle. The
lungs are otherwise clear. Cardiomediastinal silhouette is within
normal limits. No pleural effusion or pneumothorax. No acute
fractures.
IMPRESSION: 1. Minimal patchy opacities in the left costophrenic angle may
represent atelectasis or infection.

## 2021-02-20 NOTE — Progress Notes (Signed)
Careteam: Patient Care Team: [name], NP as PCP - General (Geriatric Medicine) [name], OD as Consulting Physician (Optometry) Caffie Pinto., MD as Referring Physician (Orthopedic Surgery) Rex Kras, DO as Referring Physician (Cardiology)  PLACE OF SERVICE:  [place] Directive information Does Patient Have a Medical Advance Directive?: Yes, Type of Advance Directive: [place];Out of facility DNR (pink MOST or yellow form), Pre-existing out of facility DNR order (yellow form or pink MOST form): Yellow form placed in chart (order not valid for inpatient use), Does patient want to make changes to medical advance directive?: No - Patient declined  No Known Allergies  Chief Complaint  Patient presents with   Medical Management of Chronic Issues    6 month follow up. Patient would like to discuss cough. Patient has had cough for years. Cough has gotten a lot worse lately.Thinks it may be blood pressure medication she is on.Patient states she had flu vaccine September 2022. No SOB with cough   Health Maintenance    Dexa scan, Flu vaccine     HPI: Patient is a 88 y.o. female for routine follow up.   Went to neurology for evaluation of OSA, recommended sleep hygiene   Neuropathy- stopped gabapentin- was making brain fuzzy. Taking mag, which does not work well but does not make her brain foggy. Drinking quinie water as well which feels like it is helping.   Ongoing cough- has gotten worse- same cough but worse, feels over the last month cough has gotten worse. Dry cough. Feels like a tickle in her throat.  No shortness of breath.  No palpitations since her ablation last year.   Htn- her norvasc was stopped due to LE edema, she was placed on metoprolol (previously on carvedilol) and hydralazine. Cough has been worse since she has changed these medication.   RLS- not having every night but still bothered by this, gabapentin helped.    Declines bone density Review of Systems:  Review of Systems  Constitutional:  Negative for chills, fever and weight loss.  HENT:  Negative for tinnitus.   Respiratory:  Positive for cough. Negative for hemoptysis, sputum production, shortness of breath and wheezing.   Cardiovascular:  Negative for chest pain, palpitations and leg swelling.  Gastrointestinal:  Negative for abdominal pain, constipation, diarrhea and heartburn.  Genitourinary:  Negative for dysuria, frequency and urgency.  Musculoskeletal:  Negative for back pain, falls, joint pain and myalgias.  Skin: Negative.   Neurological:  Negative for dizziness and headaches.  Psychiatric/Behavioral:  Negative for depression and memory loss. The patient does not have insomnia.    Past Medical History:  Diagnosis Date   Aortic stenosis    Hemorrhage of gastrointestinal tract, unspecified    Hypertension    Migraine    Mitral regurgitation    OA (osteoarthritis)    Thyroid cyst    Past Surgical History:  Procedure Laterality Date   CATARACT EXTRACTION  1990   rt   CATARACT EXTRACTION  2005   left   LEFT HEART CATH AND CORONARY ANGIOGRAPHY N/A 10/06/2019   Procedure: LEFT HEART CATH AND CORONARY ANGIOGRAPHY;  Surgeon: Nigel Mormon, MD;  Location: [facility];  Service: Cardiovascular;  Laterality: N/A;   REVISION TOTAL KNEE ARTHROPLASTY  2008   SVT ABLATION N/A 01/18/2020   Procedure: SVT ABLATION;  Surgeon: Evans Lance, MD;  Location: [facility];  Service: Cardiovascular;  Laterality: N/A;  THUMB ARTHROSCOPY     TONSILLECTOMY     Social History:   reports that she quit smoking about 73 years ago. Her smoking use included cigarettes. She has never used smokeless tobacco. She reports that she does not currently use alcohol. She reports that she does not use drugs.  Family History  Problem Relation Age of Onset   CVA Father 25   Pancreatic cancer Mother    CVA Sister 48   Brain cancer Brother     Arthritis Sister    Heart disease Sister    Breast cancer Neg Hx     Medications: Patient's Medications  New Prescriptions   No medications on file  Previous Medications   BLOOD PRESSURE MONITORING KIT    Essential hypertension check bp daily   CALCIUM CARBONATE-VITAMIN D 600-400 MG-UNIT PER TABLET    Take 1 tablet by mouth in the morning and at bedtime.   HYDRALAZINE (APRESOLINE) 10 MG TABLET    TAKE 1 TABLET(10 MG) BY MOUTH THREE TIMES DAILY   LEVOTHYROXINE (SYNTHROID) 100 MCG TABLET    TAKE 1 TABLET BY MOUTH EVERY DAY 30 MINUTES BEFORE BREAKFAST ON AN EMPTY STOMACH   LIGHT MINERAL OIL-MINERAL OIL (RETAINE MGD) 0.5-0.5 % EMUL    Place 1 drop into both eyes in the morning and at bedtime.   LOSARTAN (COZAAR) 100 MG TABLET    Take 1 tablet (100 mg total) by mouth every morning.   MAGNESIUM 30 MG TABLET    Take one tab with dinner and prn at night  (if needed) for RLS   METOPROLOL SUCCINATE (TOPROL-XL) 25 MG 24 HR TABLET    TAKE 1 TABLET BY MOUTH EVERY MORNING, HOLD IF SYSTOLIC BLOOD PRESSURE(TOP NUMBER) LESS THAN 100 OR PULSE LES THAN 55 BPM   MULTIPLE VITAMINS-MINERALS (PRESERVISION AREDS 2) CAPS    Take 1 capsule by mouth 2 (two) times a day.    OVER THE COUNTER MEDICATION    Take 2 capsules by mouth daily. MitoQ   VITAMIN B-12 (CYANOCOBALAMIN) 1000 MCG TABLET    Take 1 tablet (1,000 mcg total) by mouth daily.  Modified Medications   No medications on file  Discontinued Medications   No medications on file    Physical Exam:  Vitals:   02/20/21 1006  BP: 130/68  Pulse: (!) 59  Temp: (!) 97 F (36.1 C)  SpO2: 96%  Weight: 153 lb (69.4 kg)  Height: 5' 3" (1.6 m)   Body mass index is 27.1 kg/m. Wt Readings from Last 3 Encounters:  02/20/21 153 lb (69.4 kg)  01/19/21 150 lb (68 kg)  12/15/20 150 lb 3.2 oz (68.1 kg)    Physical Exam Constitutional:      General: She is not in acute distress.    Appearance: She is well-developed. She is not diaphoretic.  HENT:      Head: Normocephalic and atraumatic.     Mouth/Throat:     Pharynx: No oropharyngeal exudate.  Eyes:     Conjunctiva/sclera: Conjunctivae normal.     Pupils: Pupils are equal, round, and reactive to light.  Cardiovascular:     Rate and Rhythm: Normal rate and regular rhythm.     Heart sounds: Murmur heard.  Pulmonary:     Effort: Pulmonary effort is normal.     Breath sounds: Normal breath sounds.     Comments: Dry cough throughout visit. Abdominal:     General: Bowel sounds are normal.     Palpations: Abdomen is soft.  Musculoskeletal:     Cervical back: Normal range of motion and neck supple.     Right lower leg: No edema.     Left lower leg: No edema.  Skin:    General: Skin is warm and dry.  Neurological:     Mental Status: She is alert and oriented to person, place, and time.  Psychiatric:        Mood and Affect: Mood normal.    Labs reviewed: Basic Metabolic Panel: Recent Labs    02/26/20 1451 03/22/20 1115 04/22/20 1411  NA 141 137  --   K 4.5 5.0  --   CL 102 100  --   CO2 23 24  --   GLUCOSE 105* 99  --   BUN 16 13  --   CREATININE 0.83 0.77  --   CALCIUM 10.2 9.8  --   MG 2.2 2.1  --   TSH  --   --  3.78   Liver Function Tests: No results for input(s): AST, ALT, ALKPHOS, BILITOT, PROT, ALBUMIN in the last 8760 hours. No results for input(s): LIPASE, AMYLASE in the last 8760 hours. No results for input(s): AMMONIA in the last 8760 hours. CBC: Recent Labs    04/22/20 1411  WBC 6.2  NEUTROABS 3,825  HGB 13.6  HCT 42.3  MCV 86.7  PLT 195   Lipid Panel: No results for input(s): CHOL, HDL, LDLCALC, TRIG, CHOLHDL, LDLDIRECT in the last 8760 hours. TSH: Recent Labs    04/22/20 1411  TSH 3.78   A1C: Lab Results  Component Value Date   HGBA1C 5.1 08/22/2016     Assessment/Plan 1. Edema, unspecified type Resolved since stopping norvasc  2. Essential hypertension, benign -Blood pressure well controlled Continue current  medications Recheck metabolic panel - CBC with Differential/Platelet - CMP with eGFR([facility])  3. Chronic cough -ongoing, worse, follow up chest xray at this time, medication not likely cause (cardiology told her same thing) -possible r/t GERD. If chest xray normal will start PPI  -consider pulmonary referral as well.  - DG Chest 2 View; Future  4. Hypothyroidism, unspecified type -continues on synthroid  - TSH  5. Nonrheumatic aortic valve insufficiency -stable, continues with follow up by cardiology. No symptoms at this time.   6. Mild obstructive sleep apnea -lifestyle modifications at this time.   7. Generalized osteoarthritis of multiple sites Stable.   8. Neuropathic pain -off gabapentin, pain is tolerable.   9. RLS (restless legs syndrome) -ongoing but stable, continues off gabapentin.    Return in about 6 months (around 08/20/2021) for routine follow up. Carlos American. [place], [facility] [phone]

## 2021-02-21 ENCOUNTER — Other Ambulatory Visit: Payer: Self-pay

## 2021-02-21 DIAGNOSIS — I10 Essential (primary) hypertension: Secondary | ICD-10-CM

## 2021-02-21 DIAGNOSIS — I471 Supraventricular tachycardia: Secondary | ICD-10-CM

## 2021-02-28 ENCOUNTER — Other Ambulatory Visit: Payer: Medicare PPO

## 2021-02-28 ENCOUNTER — Other Ambulatory Visit: Payer: Self-pay

## 2021-02-28 DIAGNOSIS — I471 Supraventricular tachycardia: Secondary | ICD-10-CM

## 2021-02-28 DIAGNOSIS — I10 Essential (primary) hypertension: Secondary | ICD-10-CM

## 2021-02-28 LAB — BASIC METABOLIC PANEL WITH GFR
BUN: 19 mg/dL (ref 7–25)
CO2: 30 mmol/L (ref 20–32)
Calcium: 9.8 mg/dL (ref 8.6–10.4)
Chloride: 97 mmol/L — ABNORMAL LOW (ref 98–110)
Creat: 0.81 mg/dL (ref 0.60–0.95)
Glucose, Bld: 79 mg/dL (ref 65–139)
Potassium: 4.8 mmol/L (ref 3.5–5.3)
Sodium: 130 mmol/L — ABNORMAL LOW (ref 135–146)
eGFR: 68 mL/min/{1.73_m2} (ref >=60)

## 2021-03-07 ENCOUNTER — Other Ambulatory Visit: Payer: Self-pay | Admitting: Nurse Practitioner

## 2021-03-07 DIAGNOSIS — E871 Hypo-osmolality and hyponatremia: Secondary | ICD-10-CM

## 2021-03-14 ENCOUNTER — Other Ambulatory Visit: Payer: Medicare PPO

## 2021-03-14 ENCOUNTER — Other Ambulatory Visit: Payer: Self-pay

## 2021-03-14 DIAGNOSIS — E871 Hypo-osmolality and hyponatremia: Secondary | ICD-10-CM

## 2021-03-16 LAB — SODIUM, URINE, RANDOM: Sodium, Ur: 40 mmol/L (ref 28–272)

## 2021-03-16 LAB — OSMOLALITY, URINE: Osmolality, Ur: 316 mOsm/kg (ref 50–1200)

## 2021-04-26 ENCOUNTER — Ambulatory Visit
Admission: RE | Admit: 2021-04-26 | Discharge: 2021-04-26 | Disposition: A | Discharge status: Home / Self Care | Payer: Medicare PPO | Source: Ambulatory Visit | Attending: Family | Admitting: Family

## 2021-04-26 ENCOUNTER — Ambulatory Visit: Payer: Medicare PPO | Admitting: Family

## 2021-04-26 ENCOUNTER — Encounter: Payer: Self-pay | Admitting: Family

## 2021-04-26 ENCOUNTER — Ambulatory Visit (INDEPENDENT_AMBULATORY_CARE_PROVIDER_SITE_OTHER): Payer: Medicare PPO | Admitting: Family

## 2021-04-26 VITALS — BP 160/70 | HR 76 | Temp 97.4°F | Resp 16 | Ht 63.0 in | Wt 157.8 lb

## 2021-04-26 DIAGNOSIS — M25551 Pain in right hip: Secondary | ICD-10-CM

## 2021-04-26 IMAGING — CR DG HIP (WITH OR WITHOUT PELVIS) 2-3V*R*
2 series · 2 of 2 positions shown · non-contrast
Comparison: No prior.

CLINICAL DATA: Hip pain.  No known injury.

EXAM:
DG HIP (WITH OR WITHOUT PELVIS) 2-3V RIGHT

[w hip ap right]
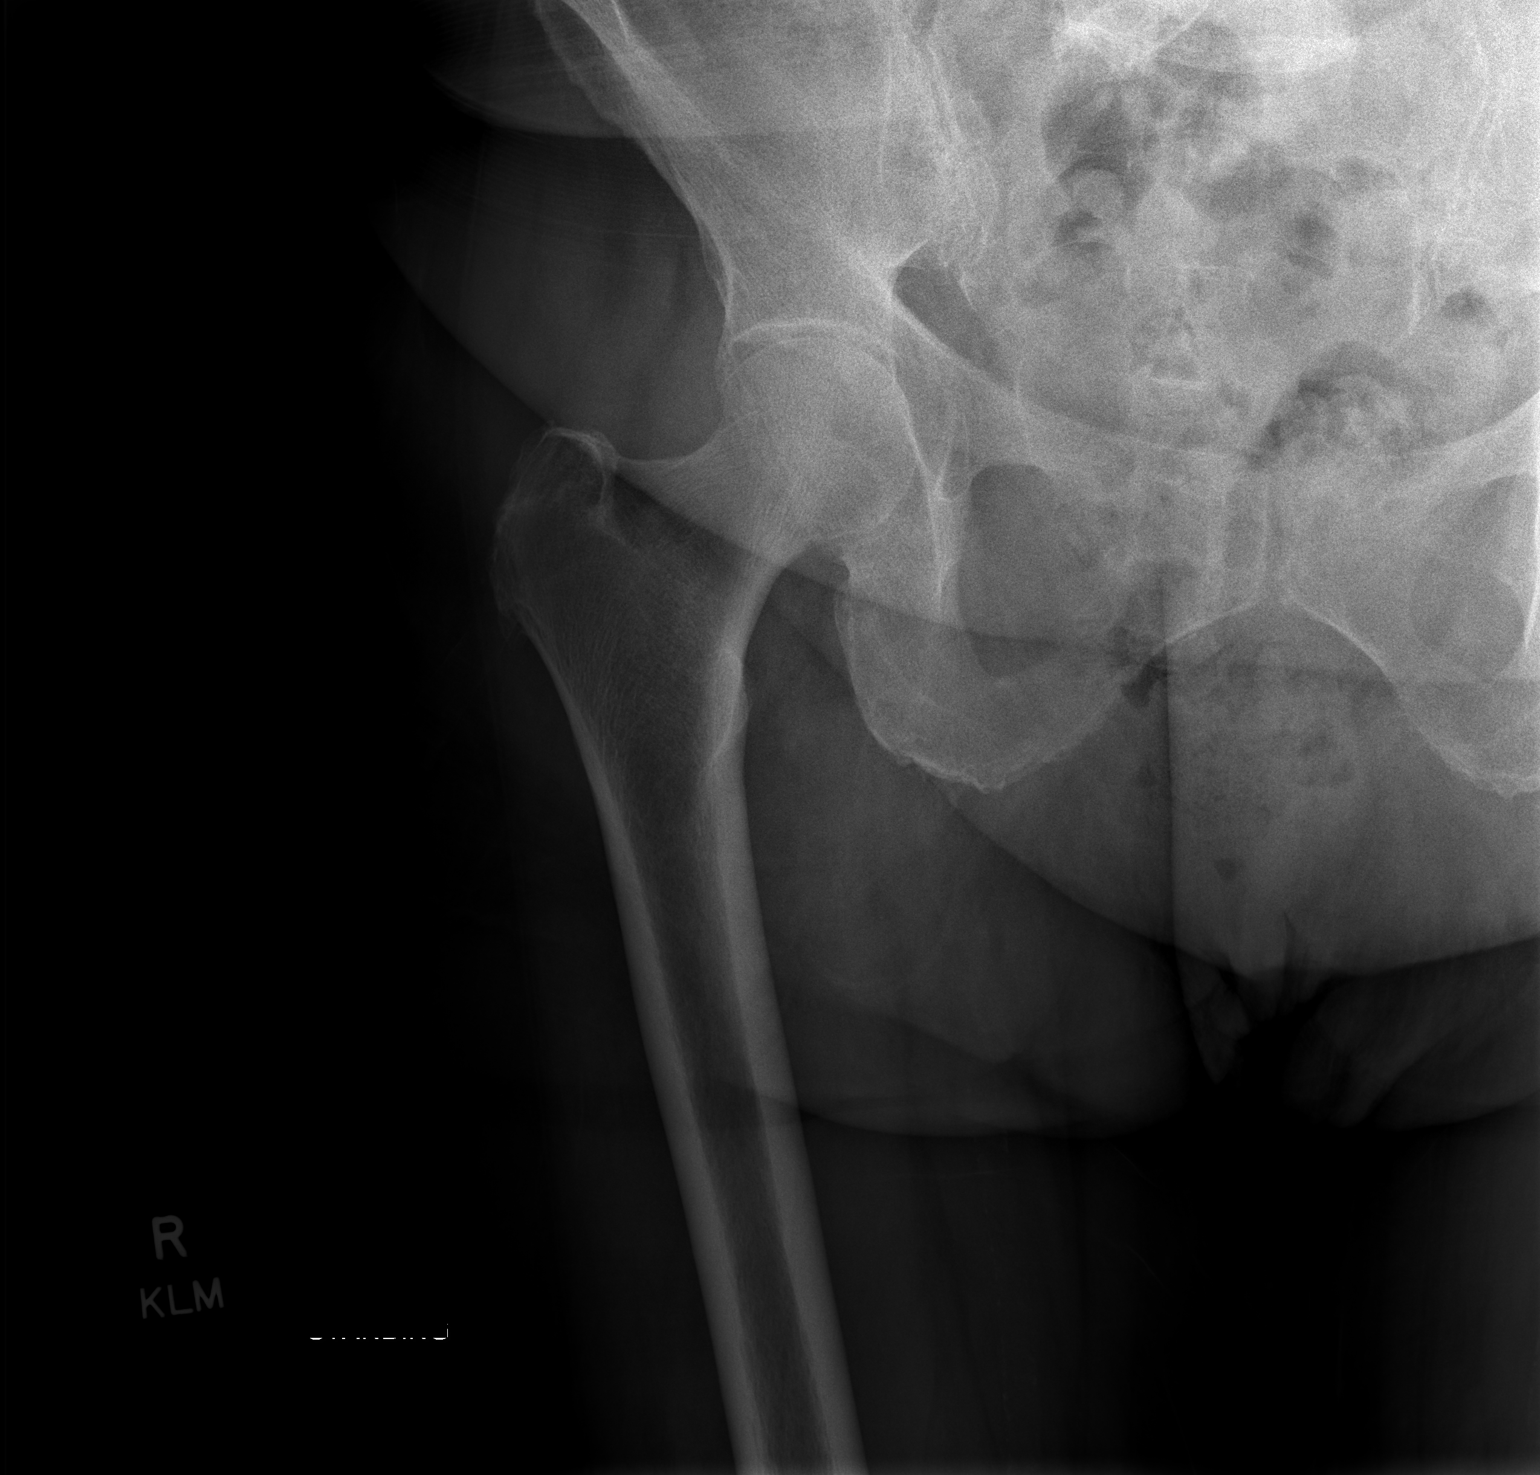

[w hip frog right]
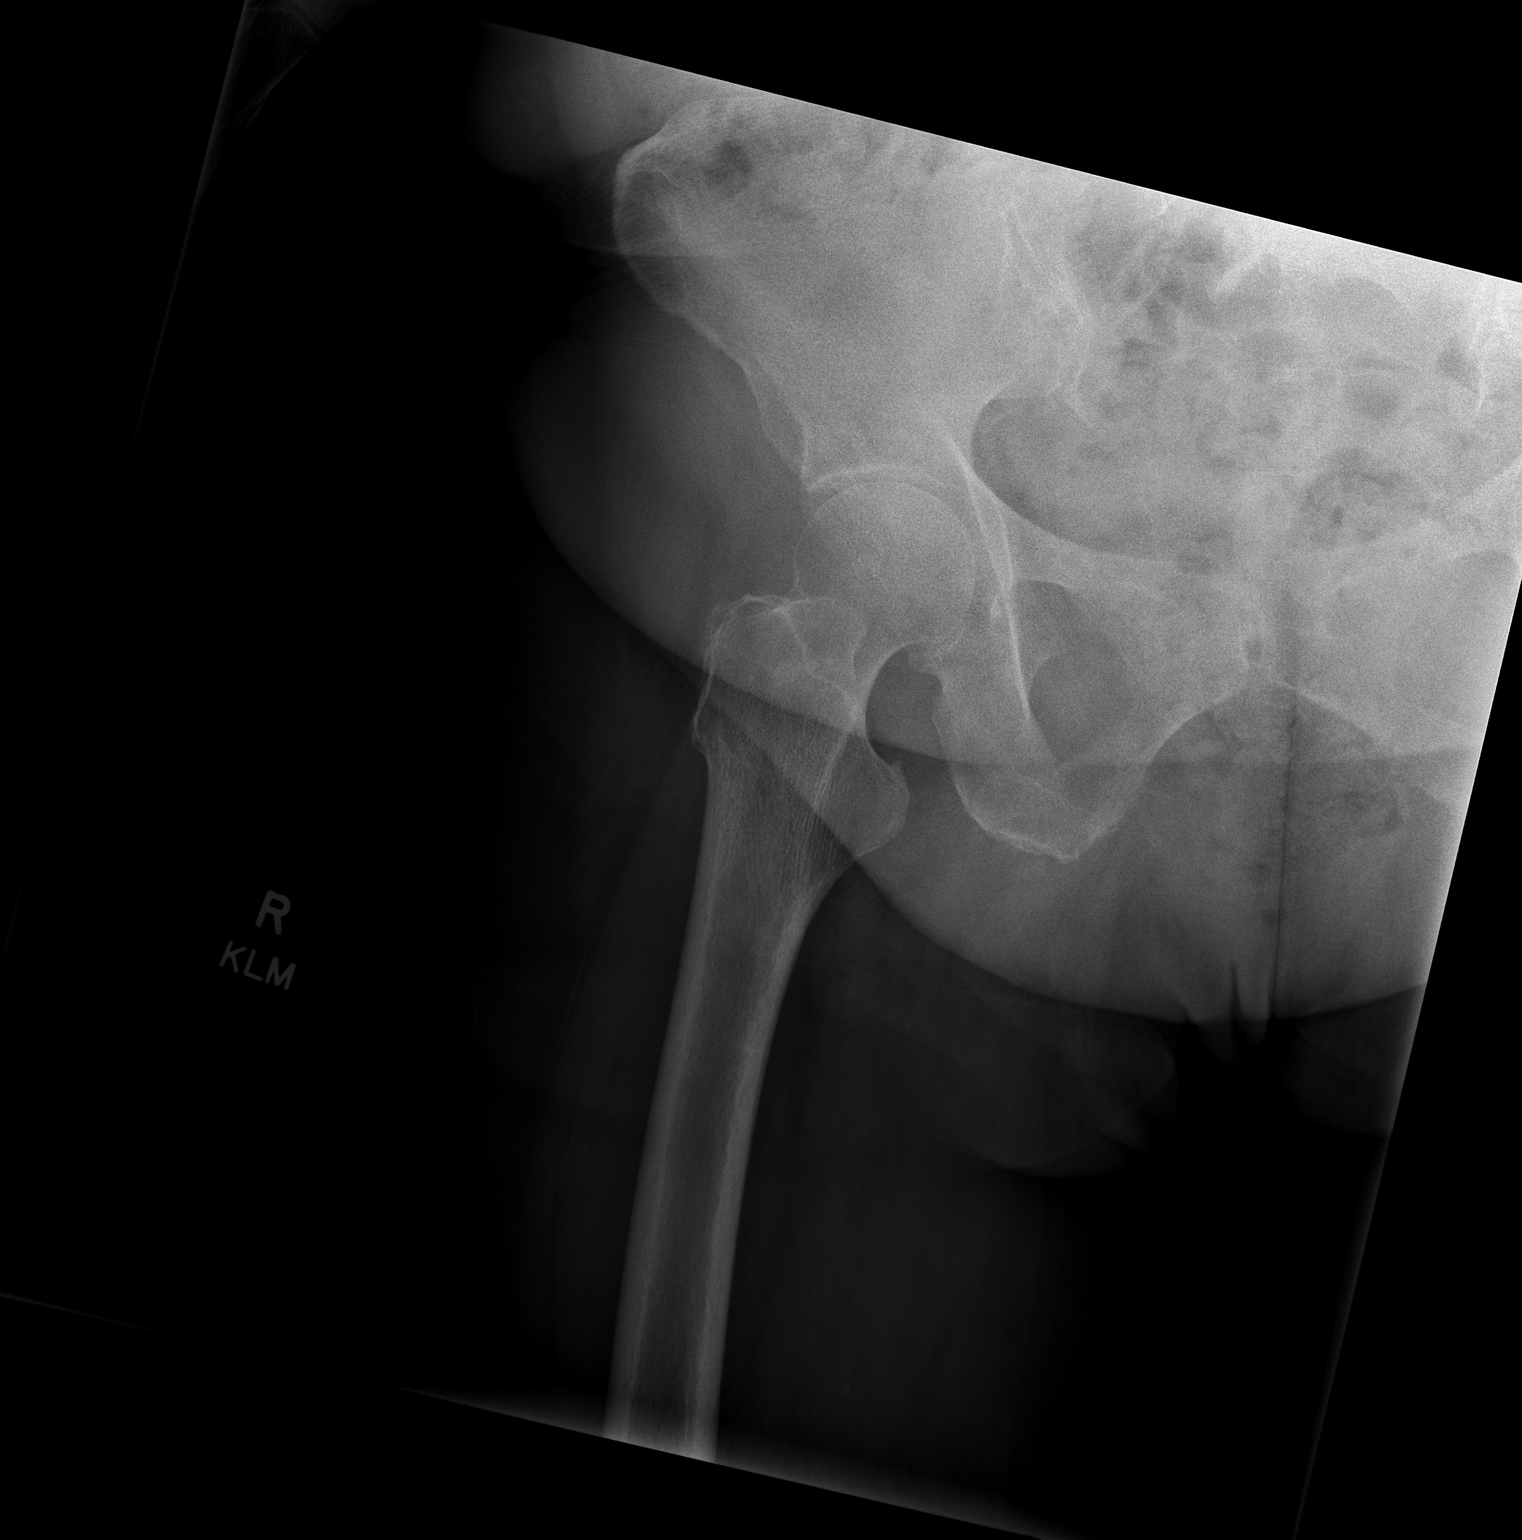

[2 of 2 positions shown; findings below may reference images not displayed]

FINDINGS: Degenerative changes right hip. Small corticated bony densities
noted adjacent to the right ischium, this may be from old injury. No
evidence of acute fracture or dislocation. A very questionable small
lucent lesion is noted the base of right femoral neck. MRI of the
right hip suggested for further evaluation.
IMPRESSION: 1. Very questionable small lucent lesion is noted in the base of the
right femoral neck. MRI of the right hip suggested for further
evaluation.

2. Degenerative changes right hip. Small corticated bony densities
noted adjacent to the right ischium, this may be from old injury. No
evidence of acute fracture or dislocation.

## 2021-04-26 MED ORDER — MELOXICAM 7.5 MG PO TABS: 7.5000 mg | ORAL_TABLET | Freq: Every day | ORAL | 0 refills | Status: DC

## 2021-04-26 NOTE — Patient Instructions (Signed)
-   Please get right hip X-ray at [facility] at [address] then will call you with results.  

## 2021-04-26 NOTE — Progress Notes (Signed)
? ?Provider: Marlowe Sax FNP-C ? ?[name], NP ? ?Patient Care Team: ?[name], NP as PCP - General (Geriatric Medicine) ?[name], OD as Consulting Physician (Optometry) ?Caffie Pinto., MD as Referring Physician (Orthopedic Surgery) ?Rex Kras, DO as Referring Physician (Cardiology) ? ?Extended Emergency Contact Information ?Primary Emergency Contact: Skipper,Susan ? [place] ?Home Phone: [phone] ?Relation: Daughter ?Secondary Emergency Contact: Speaks,Amy ?Mobile Phone: [phone] ?Relation: Daughter ? ?Code Status:  DNR ?Goals of care: Advanced Directive information ? ?  04/26/2021  ?  1:37 PM  ?Advanced Directives  ?Does Patient Have a Medical Advance Directive? Yes  ?Type of Paramedic of [place];Out of facility DNR (pink MOST or yellow form)  ?Does patient want to make changes to medical advance directive? No - Patient declined  ?Copy of [place] in Chart? Yes - validated most recent copy scanned in chart (See row information)  ? ? ? ?Chief Complaint  ?Patient presents with  ? Acute Visit  ?  Patient is here for Right Hip Pain X started Friday 04/21/2021  ? ? ?HPI:  ?Donna [name] is a 88 y.o. female seen today for an acute visit for evaluation of right hip pain x 6 days.pain worst with walking and depends on what she sits on.pain does not radiate down to the leg.she denies any injuries,fall,numbness,tingling or weakness of right leg. Has a significant medical history of Osteoarthritis.  ? ? ?Past Medical History:  ?Diagnosis Date  ? Aortic stenosis   ? Hemorrhage of gastrointestinal tract, unspecified   ? Hypertension   ? Migraine   ? Mitral regurgitation   ? OA (osteoarthritis)   ? Thyroid cyst   ? ?Past Surgical History:  ?Procedure Laterality Date  ? CATARACT EXTRACTION  1990  ? rt  ? CATARACT EXTRACTION  2005  ? left  ? LEFT HEART CATH AND CORONARY ANGIOGRAPHY N/A 10/06/2019  ? Procedure: LEFT HEART CATH AND CORONARY  ANGIOGRAPHY;  Surgeon: Nigel Mormon, MD;  Location: [facility];  Service: Cardiovascular;  Laterality: N/A;  ? REVISION TOTAL KNEE ARTHROPLASTY  2008  ? SVT ABLATION N/A 01/18/2020  ? Procedure: SVT ABLATION;  Surgeon: Evans Lance, MD;  Location: [facility];  Service: Cardiovascular;  Laterality: N/A;  ? THUMB ARTHROSCOPY    ? TONSILLECTOMY    ? ? ?No Known Allergies ? ?Outpatient Encounter Medications as of 04/26/2021  ?Medication Sig  ? Blood Pressure Monitoring KIT Essential hypertension check bp daily  ? Calcium Carbonate-Vitamin D 600-400 MG-UNIT per tablet Take 1 tablet by mouth in the morning and at bedtime.  ? hydrALAZINE (APRESOLINE) 10 MG tablet TAKE 1 TABLET(10 MG) BY MOUTH THREE TIMES DAILY  ? levothyroxine (SYNTHROID) 100 MCG tablet TAKE 1 TABLET BY MOUTH EVERY DAY 30 MINUTES BEFORE BREAKFAST ON AN EMPTY STOMACH  ? Light Mineral Oil-Mineral Oil (RETAINE MGD) 0.5-0.5 % EMUL Place 1 drop into both eyes in the morning and at bedtime.  ? losartan (COZAAR) 100 MG tablet Take 1 tablet (100 mg total) by mouth every morning.  ? magnesium 30 MG tablet Take one tab with dinner and prn at night  (if needed) for RLS  ? metoprolol succinate (TOPROL-XL) 25 MG 24 hr tablet TAKE 1 TABLET BY MOUTH EVERY MORNING, HOLD IF SYSTOLIC BLOOD PRESSURE(TOP NUMBER) LESS THAN 100 OR PULSE LES THAN 55 BPM  ? Multiple Vitamins-Minerals (PRESERVISION AREDS 2) CAPS Take 1 capsule by mouth 2 (two) times a day.   ?  OVER THE COUNTER MEDICATION Take 2 capsules by mouth daily. MitoQ  ? vitamin B-12 (CYANOCOBALAMIN) 1000 MCG tablet Take 1 tablet (1,000 mcg total) by mouth daily.  ? ?No facility-administered encounter medications on file as of 04/26/2021.  ? ? ?Review of Systems  ?Constitutional:  Negative for appetite change, chills, fatigue, fever and unexpected weight change.  ?Respiratory:  Negative for cough, chest tightness, shortness of breath and wheezing.   ?Cardiovascular:  Negative for chest pain,  palpitations and leg swelling.  ?Gastrointestinal:  Negative for nausea.  ?Musculoskeletal:  Positive for arthralgias and gait problem. Negative for back pain, joint swelling, myalgias, neck pain and neck stiffness.  ?Skin:  Negative for color change, pallor, rash and wound.  ?Neurological:  Negative for dizziness, weakness and numbness.  ? ?Immunization History  ?Administered Date(s) Administered  ? Fluad Quad(high Dose 65+) 10/08/2018  ? Influenza Whole 10/16/2011  ? Influenza, High Dose Seasonal PF 10/01/2017, 10/07/2019  ? Influenza,inj,Quad PF,6+ Mos 09/21/2015  ? Influenza-Unspecified 10/22/2013, 10/01/2014, 09/26/2016, 10/28/2020  ? PFIZER(Purple Top)SARS-COV-2 Vaccination 02/22/2019, 03/19/2019, 01/04/2020, 06/22/2020  ? Pension scheme manager 67yr & up 10/28/2020  ? Pneumococcal Conjugate-13 08/13/2014  ? Pneumococcal Polysaccharide-23 08/19/2015  ? Tdap 08/27/2013  ? Zoster Recombinat (Shingrix) 05/29/2016, 08/23/2016  ? Zoster, Live 01/16/2011  ? ?Pertinent  Health Maintenance Due  ?Topic Date Due  ? DEXA SCAN  02/20/2022 (Originally 01/04/1995)  ? INFLUENZA VACCINE  08/15/2021  ? ? ?  04/22/2020  ?  1:38 PM 08/24/2020  ?  9:59 AM 09/27/2020  ?  3:54 PM 02/20/2021  ? 10:05 AM 04/26/2021  ?  1:37 PM  ?Fall Risk  ?Falls in the past year? 0 1 0 0 0  ?Was there an injury with Fall? 0 0 0 0 0  ?Fall Risk Category Calculator 0 1 0 0 0  ?Fall Risk Category Low Low Low Low Low  ?Patient Fall Risk Level Low fall risk Low fall risk Low fall risk Low fall risk Low fall risk  ?Patient at Risk for Falls Due to  No Fall Risks No Fall Risks No Fall Risks No Fall Risks  ?Fall risk Follow up  Falls evaluation completed Falls evaluation completed Falls evaluation completed Falls evaluation completed  ? ?Functional Status Survey: ?  ? ?Vitals:  ? 04/26/21 1332  ?BP: (!) 160/70  ?Pulse: 76  ?Resp: 16  ?Temp: (!) 97.4 ?F (36.3 ?C)  ?SpO2: 96%  ?Weight: 157 lb 12.8 oz (71.6 kg)  ?Height: 5' 3"  (1.6 m)  ? ?Body mass  index is 27.95 kg/m?.Marland Kitchen?Physical Exam ?Vitals reviewed.  ?Constitutional:   ?   General: She is not in acute distress. ?   Appearance: Normal appearance. She is normal weight. She is not ill-appearing or diaphoretic.  ?Eyes:  ?   General: No scleral icterus.    ?   Right eye: No discharge.     ?   Left eye: No discharge.  ?   Conjunctiva/sclera: Conjunctivae normal.  ?   Pupils: Pupils are equal, round, and reactive to light.  ?Cardiovascular:  ?   Rate and Rhythm: Normal rate and regular rhythm.  ?   Pulses: Normal pulses.  ?   Heart sounds: Murmur heard.  ?  No friction rub. No gallop.  ?Pulmonary:  ?   Effort: Pulmonary effort is normal. No respiratory distress.  ?   Breath sounds: Normal breath sounds. No wheezing, rhonchi or rales.  ?Chest:  ?   Chest wall: No tenderness.  ?  Abdominal:  ?   General: Bowel sounds are normal. There is no distension.  ?   Palpations: Abdomen is soft. There is no mass.  ?   Tenderness: There is no abdominal tenderness. There is no right CVA tenderness, left CVA tenderness, guarding or rebound.  ?Musculoskeletal:     ?   General: No swelling. Normal range of motion.  ?   Right hip: Tenderness present. No crepitus. Normal range of motion. Normal strength.  ?   Left hip: Normal.  ?   Right lower leg: No edema.  ?   Left lower leg: No edema.  ?   Comments: Unsteady gait walks with a cane   ?Skin: ?   General: Skin is warm and dry.  ?   Coloration: Skin is not pale.  ?   Findings: No erythema.  ?Neurological:  ?   Mental Status: She is alert and oriented to person, place, and time.  ?   Motor: No weakness.  ?   Coordination: Coordination normal.  ?   Gait: Gait abnormal.  ?Psychiatric:     ?   Mood and Affect: Mood normal.     ?   Speech: Speech normal.     ?   Behavior: Behavior normal.  ? ? ?Labs reviewed: ?Recent Labs  ?  02/20/21 ?1038 02/28/21 ?1026  ?NA 130* 130*  ?K 5.2 4.8  ?CL 97* 97*  ?CO2 30 30  ?GLUCOSE 92 79  ?BUN 20 19  ?CREATININE 0.84 0.81  ?CALCIUM 10.3 9.8  ? ?Recent  Labs  ?  02/20/21 ?1038  ?AST 13  ?ALT 15  ?BILITOT 0.6  ?PROT 5.7*  ? ?Recent Labs  ?  02/20/21 ?1038  ?WBC 5.4  ?NEUTROABS 3,191  ?HGB 13.1  ?HCT 38.4  ?MCV 89.7  ?PLT 172  ? ?Lab Results  ?Component Value Date

## 2021-04-27 ENCOUNTER — Other Ambulatory Visit: Payer: Self-pay | Admitting: Family

## 2021-04-27 DIAGNOSIS — M999 Biomechanical lesion, unspecified: Secondary | ICD-10-CM

## 2021-04-27 DIAGNOSIS — M25551 Pain in right hip: Secondary | ICD-10-CM

## 2021-04-27 NOTE — Progress Notes (Signed)
Orthopedic referral and MRI ordered. ?

## 2021-05-01 ENCOUNTER — Ambulatory Visit: Payer: Medicare PPO | Admitting: Orthopedic Surgery

## 2021-05-01 DIAGNOSIS — M25551 Pain in right hip: Secondary | ICD-10-CM | POA: Diagnosis not present

## 2021-05-02 ENCOUNTER — Other Ambulatory Visit: Payer: Self-pay | Admitting: Nurse Practitioner

## 2021-05-02 DIAGNOSIS — E041 Nontoxic single thyroid nodule: Secondary | ICD-10-CM

## 2021-05-02 DIAGNOSIS — E038 Other specified hypothyroidism: Secondary | ICD-10-CM

## 2021-05-03 ENCOUNTER — Encounter: Payer: Self-pay | Admitting: Orthopedic Surgery

## 2021-05-03 NOTE — Progress Notes (Signed)
? ?Office Visit Note ?  ?Patient: Donna [name]           ?Date of Birth: March 14, 1927           ?MRN: [id number] ?Visit Date: 05/01/2021 ?Requested by: Sandrea Hughs, NP ?[address] ?[place],  [place] [id number] ?PCP: [name], NP ? ?Subjective: ?Chief Complaint  ?Patient presents with  ? Right Hip - Pain  ? ? ?HPI: [name] is an active 88 year old patient with right hip pain of 2 weeks duration.  She is here with her daughter.  They both do live together.  Denies any history of injury.  She is walking with a cane.  Prior to 2 weeks ago she was using no assistive devices.  Does not report any radiating leg pain or back pain or numbness and tingling.  The pain does wake her from sleep at night.  Reports some tenderness around the trochanteric region.  Has tried Tylenol without relief.  Mobic also has minimal effect.  Weightbearing is becoming more difficult.  Does have a history of epidural steroid injections x3 last December.  Denies any fevers or chills and does not report a history of osteoporosis.  Radiographs done by her primary care provider show a questionable lytic lesion in the femoral neck which is only seen on one view.  Not too much in the way of hip arthritis present.  Back radiographs from a year ago of the lumbar spine show moderate lumbar spondylosis as well as grade 2 anterolisthesis L4 on L5.  Note MRI scan of the lumbar spine has been performed recently. ?             ?ROS: All systems reviewed are negative as they relate to the chief complaint within the history of present illness.  Patient denies  fevers or chills. ? ? ?Assessment & Plan: ?Visit Diagnoses:  ?1. Pain in right hip   ? ? ?Plan: Impression is right hip pain without discrete tenderness over the trochanteric region.  Strength is also pretty reasonable and symmetric between the right and left hip.  She is having weightbearing pain.  No acute injury or fracture.  Unclear etiology of the pain at this time.  Could be stress reaction or  stress fracture of the femoral neck or intertrochanteric region.  Could be referred pain from the back which I think is also high on the differential.  The lytic lesion may or may not be a true finding.  Nonetheless her ambulatory status is quickly decreasing and she is having difficulty weightbearing.  Recommended that she use a walker which she does have at home and we will obtain stat MRI of the pelvis to evaluate for stress fracture versus some type of atypical bony lesion in the femoral neck.  See her back after that study.  Encouraged her to do the minimal amount of weightbearing necessary to get around until we have a better idea about the cause of her symptoms.  Hip is not particularly irritable today with rotation. ? ?Follow-Up Instructions: Return for after MRI.  ? ?Orders:  ?Orders Placed This Encounter  ?Procedures  ? MR Pelvis w/o contrast  ? ?No orders of the defined types were placed in this encounter. ? ? ? ? Procedures: ?No procedures performed ? ? ?Clinical Data: ?No additional findings. ? ?Objective: ?Vital Signs: There were no vitals taken for this visit. ? ?Physical Exam:  ? ?Constitutional: Patient appears well-developed ?HEENT:  ?Head: Normocephalic ?Eyes:EOM are normal ?Neck: Normal  range of motion ?Cardiovascular: Normal rate ?Pulmonary/chest: Effort normal ?Neurologic: Patient is alert ?Skin: Skin is warm ?Psychiatric: Patient has normal mood and affect ? ? ?Ortho Exam: Ortho exam demonstrates palpable pedal pulses.  No nerve root tension signs.  No paresthesias L1 S1 bilaterally.  5 out of 5 ankle dorsiflexion plantarflexion quad hamstring strength.  Also hip flexion strength abduction adduction strength 5+ out of 5 bilaterally and symmetric.  No groin pain with internal/external Tatian of the leg.  Not too much pain with forward lateral bending.  No discrete tenderness over the trochanteric region.  She does have an antalgic gait to the right which is non-Trendelenburg. ? ?Specialty  Comments:  ?No specialty comments available. ? ?Imaging: ?No results found. ? ? ?PMFS History: ?Patient Active Problem List  ? Diagnosis Date Noted  ? Dysesthesia affecting both sides of body 01/19/2021  ? Mild obstructive sleep apnea 10/23/2020  ? Excessive daytime sleepiness 07/25/2020  ? RLS (restless legs syndrome) 07/25/2020  ? Intermittent palpitations 07/25/2020  ? Nonrheumatic aortic valve insufficiency   ? Bradycardia 11/13/2019  ? Mitral regurgitation and aortic stenosis 11/12/2019  ? Severe mitral regurgitation 11/12/2019  ? SVT (supraventricular tachycardia) ([facility]) 11/12/2019  ? Exertional dyspnea 10/02/2019  ? Overweight (BMI 25.0-29.9) 10/14/2017  ? BMI 27.0-27.9,adult 10/14/2017  ? Balance problem 10/14/2017  ? Neuropathic pain 09/03/2016  ? Bilateral nonexudative age-related macular degeneration 09/03/2016  ? Generalized osteoarthritis of multiple sites 05/07/2013  ? Hypothyroidism 05/07/2013  ? Cyst of thyroid 05/07/2013  ? Hyperhidrosis 05/07/2013  ? Essential hypertension, benign 05/07/2013  ? Cough 01/31/2012  ? ?Past Medical History:  ?Diagnosis Date  ? Aortic stenosis   ? Hemorrhage of gastrointestinal tract, unspecified   ? Hypertension   ? Migraine   ? Mitral regurgitation   ? OA (osteoarthritis)   ? Thyroid cyst   ?  ?Family History  ?Problem Relation Age of Onset  ? CVA Father 43  ? Pancreatic cancer Mother   ? CVA Sister 63  ? Brain cancer Brother   ? Arthritis Sister   ? Heart disease Sister   ? Breast cancer Neg Hx   ?  ?Past Surgical History:  ?Procedure Laterality Date  ? CATARACT EXTRACTION  1990  ? rt  ? CATARACT EXTRACTION  2005  ? left  ? LEFT HEART CATH AND CORONARY ANGIOGRAPHY N/A 10/06/2019  ? Procedure: LEFT HEART CATH AND CORONARY ANGIOGRAPHY;  Surgeon: Nigel Mormon, MD;  Location: [facility];  Service: Cardiovascular;  Laterality: N/A;  ? REVISION TOTAL KNEE ARTHROPLASTY  2008  ? SVT ABLATION N/A 01/18/2020  ? Procedure: SVT ABLATION;  Surgeon: Evans Lance,  MD;  Location: [facility];  Service: Cardiovascular;  Laterality: N/A;  ? THUMB ARTHROSCOPY    ? TONSILLECTOMY    ? ?Social History  ? ?Occupational History  ? Not on file  ?Tobacco Use  ? Smoking status: Former  ?  Years: 2.00  ?  Types: Cigarettes  ?  Quit date: 01/16/1948  ?  Years since quitting: 73.3  ? Smokeless tobacco: Never  ? Tobacco comments:  ?  smoked on occ when she was a teen  ?Vaping Use  ? Vaping Use: Never used  ?Substance and Sexual Activity  ? Alcohol use: Not Currently  ?  Comment: rarely has a glass of wine  ? Drug use: No  ? Sexual activity: Not on file  ? ? ? ? ? ?

## 2021-05-08 ENCOUNTER — Ambulatory Visit
Admission: RE | Admit: 2021-05-08 | Discharge: 2021-05-08 | Disposition: A | Discharge status: Home / Self Care | Payer: Medicare PPO | Source: Ambulatory Visit | Attending: Family | Admitting: Family

## 2021-05-08 ENCOUNTER — Ambulatory Visit
Admission: RE | Admit: 2021-05-08 | Discharge: 2021-05-08 | Disposition: A | Discharge status: Home / Self Care | Payer: Medicare PPO | Source: Ambulatory Visit | Attending: Orthopedic Surgery | Admitting: Orthopedic Surgery

## 2021-05-08 DIAGNOSIS — M25551 Pain in right hip: Secondary | ICD-10-CM

## 2021-05-08 DIAGNOSIS — M999 Biomechanical lesion, unspecified: Secondary | ICD-10-CM

## 2021-05-08 IMAGING — MR MR HIP*R* WO/W CM
8 series · 36 of 40 positions shown · IV contrast (multihance)
Comparison: MRI pelvis [DATE] and hip radiographs [DATE]

CLINICAL DATA: Possible right hip lucent lesion, anterolateral
right hip pain

EXAM:
MRI OF THE RIGHT HIP WITHOUT AND WITH CONTRAST
TECHNIQUE: Multiplanar, multisequence MR imaging was performed both before and
after administration of intravenous contrast.
CONTRAST:  15mL MULTIHANCE GADOBENATE DIMEGLUMINE 529 MG/ML IV SOLN

[Series 17: T2 fat-sat · coronal · right · 3.0mm · 0.89mm/px · 5 of 32 slices shown (1 of 2)]
[im 1/32]
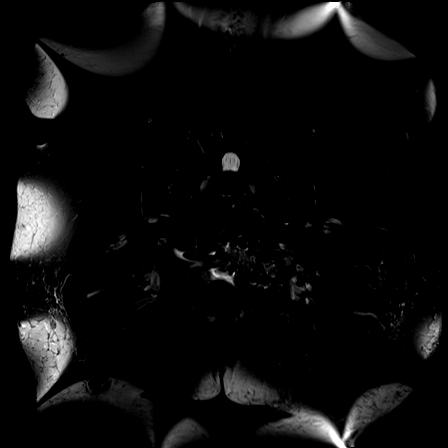
[im 8/32]
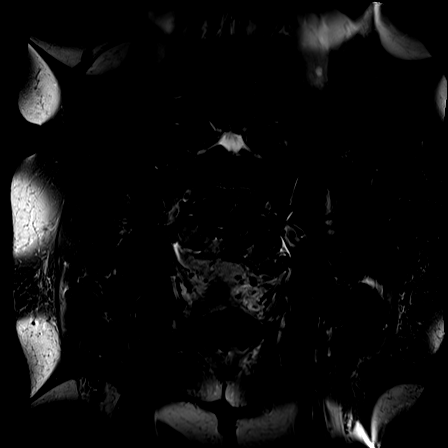
[im 16/32]
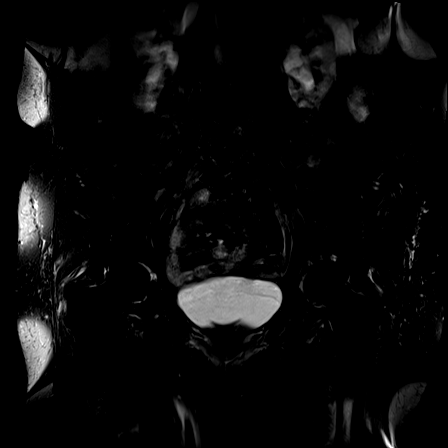
[im 24/32]
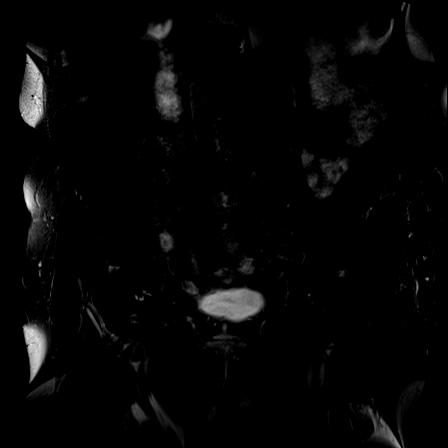
[im 32/32]
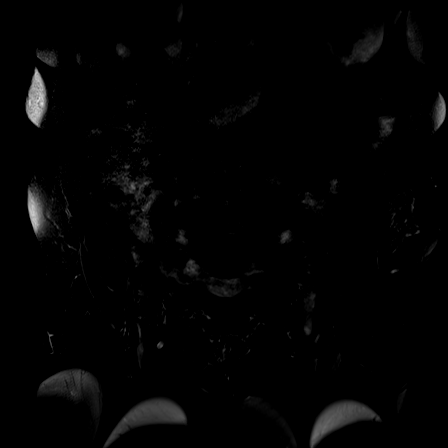

[Series 18: T1 · coronal · right · 3.0mm · 0.89mm/px · 6 of 38 slices shown]
[im 1/38]
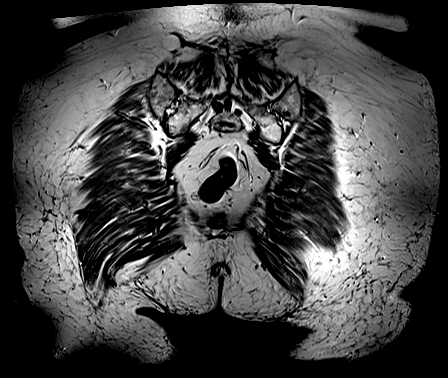
[im 8/38]
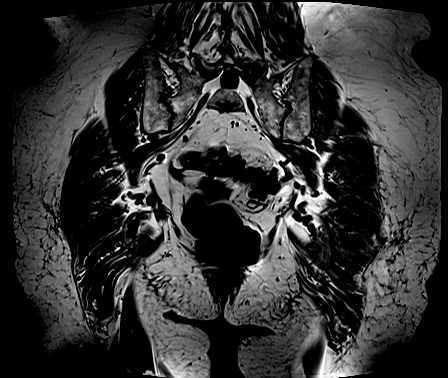
[im 15/38]
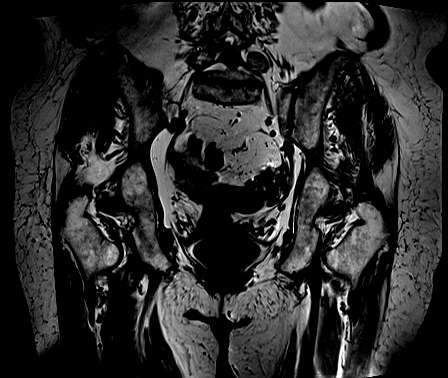
[im 23/38]
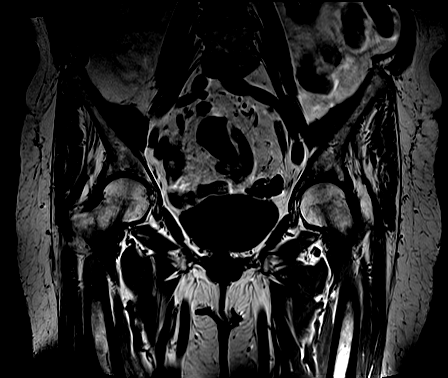
[im 30/38]
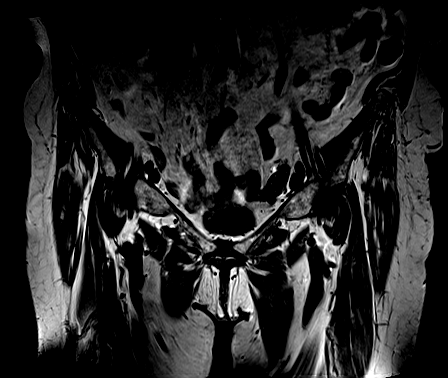
[im 38/38]
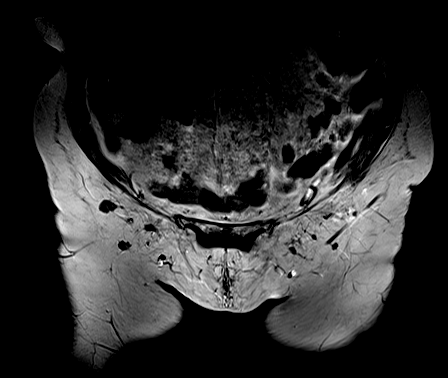

[Series 19: T2 fat-sat · axial · right · 3.0mm · 1.19mm/px · z∈[+63,+175]mm · 5 of 32 slices shown (2 of 2)]
[im 1/32]
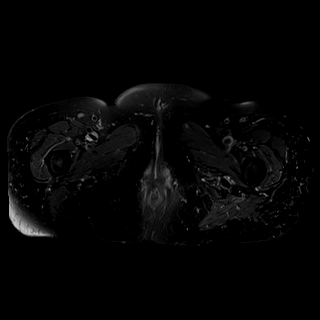
[im 8/32]
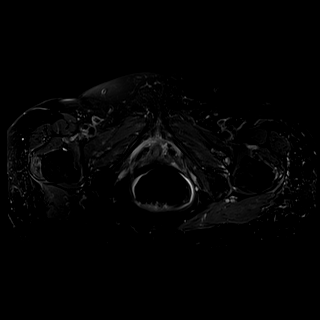
[im 16/32]
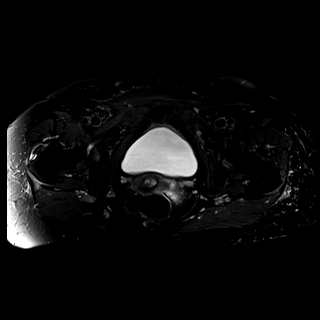
[im 24/32]
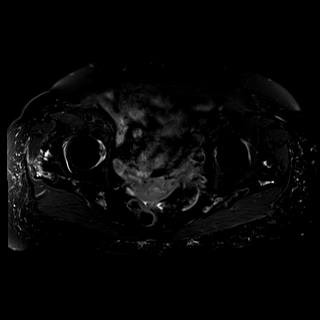
[im 32/32]
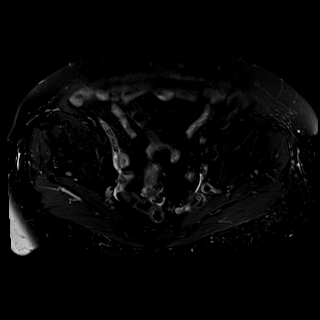

[Series 20: PD fat-sat · coronal · right · 3.0mm · 0.56mm/px · 5 of 28 slices shown (1 of 2)]
[im 1/28]
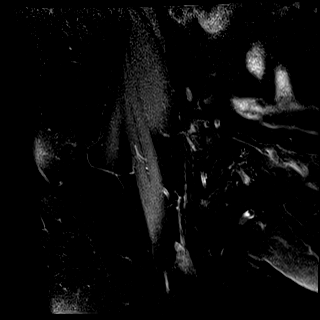
[im 7/28]
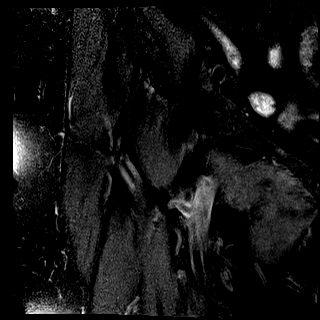
[im 14/28]
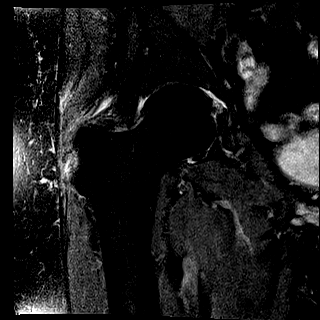
[im 21/28]
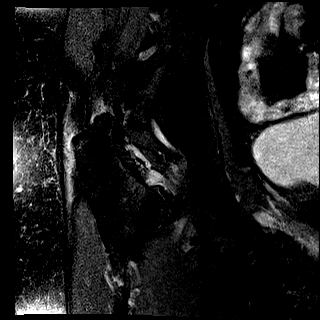
[im 28/28]
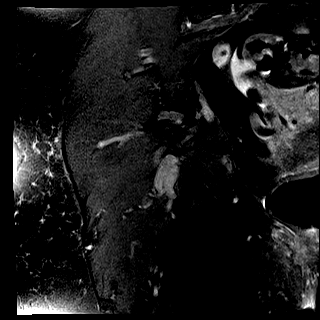

[Series 21: PD fat-sat · sagittal · right · 3.0mm · 0.56mm/px · 6 of 35 slices shown (2 of 2)]
[im 1/35]
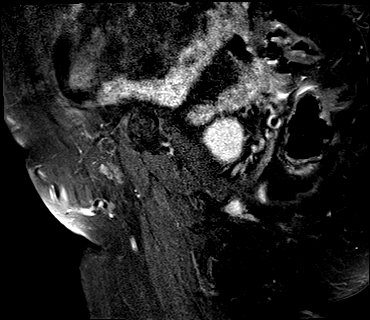
[im 7/35]
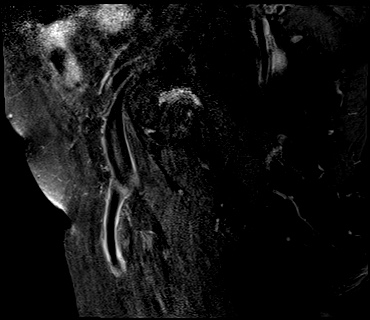
[im 14/35]
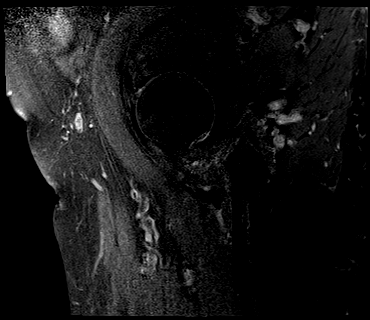
[im 21/35]
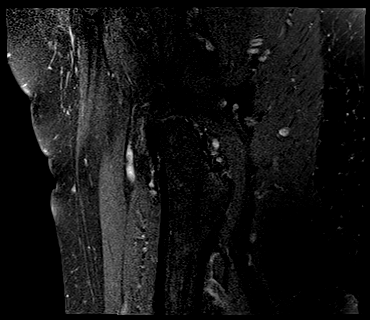
[im 28/35]
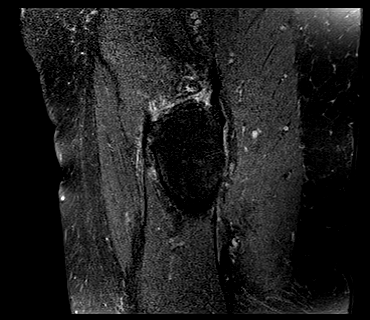
[im 35/35]
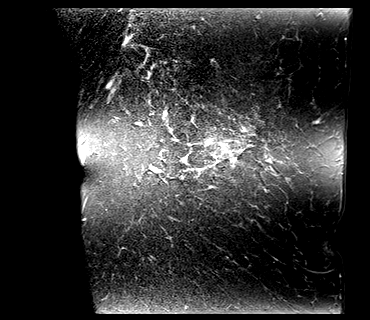

[Series 22: T1 fat-sat · axial · non-contrast · right · 3.0mm · 0.56mm/px · z∈[+79,+173]mm · 4 of 27 slices shown]
[im 1/27]
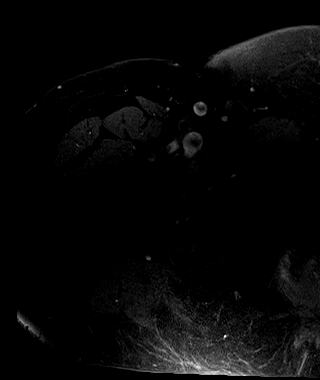
[im 9/27]
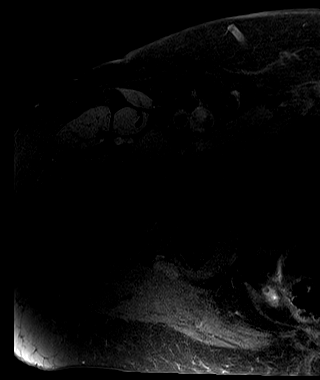
[im 18/27]
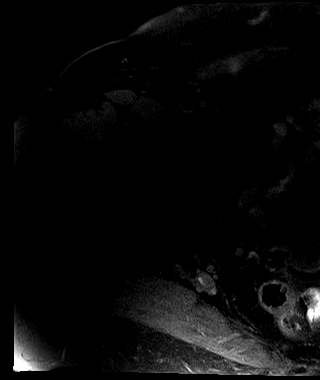
[im 27/27]
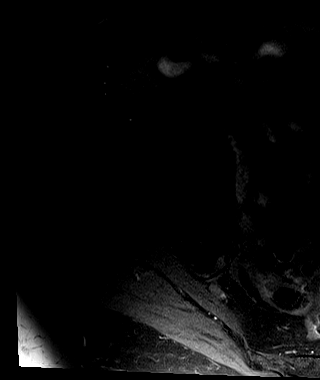

[Series 23: T1 fat-sat post-contrast · axial · right · 3.0mm · 0.56mm/px · z∈[+79,+173]mm · 4 of 27 slices shown (1 of 2)]
[im 1/27]
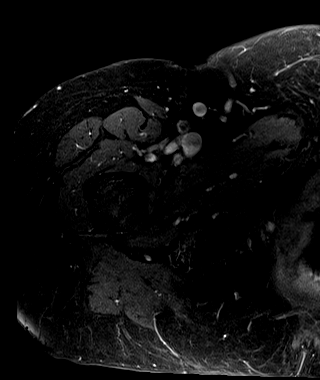
[im 9/27]
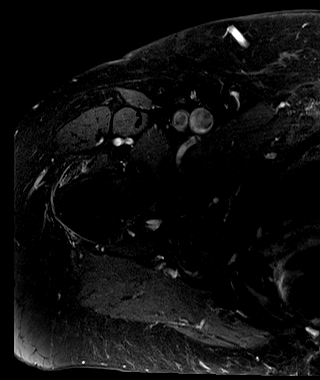
[im 18/27]
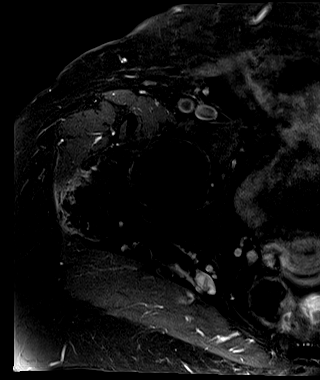
[im 27/27]
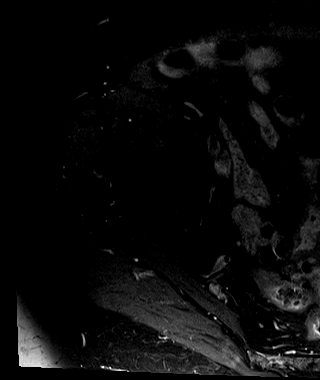

[Series 24: T1 fat-sat post-contrast · coronal · right · 3.0mm · 0.56mm/px · 1 of 28 slices shown (2 of 2)]
[im 1/28]
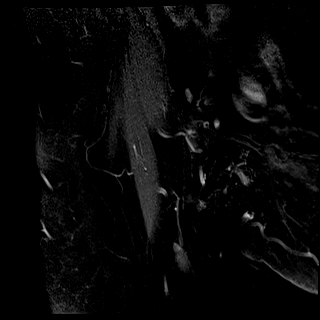

[36 of 40 positions shown; findings below may reference images not displayed]

FINDINGS: Bones: Moderate degenerative hip arthropathy bilaterally.
Degenerative subcortical cysts in the left acetabulum. There is
evidence of lumbar spondylosis and degenerative disc disease
particularly at L4-5. No significant lesion noted in the right
femoral neck to correspond with the questionable radiographic
finding.

Articular cartilage and labrum

Articular cartilage:  Moderate thinning bilaterally.

Labrum: Blunted or truncated appearance of the right anterior labrum
making it difficult to exclude a chronic tear.

Joint or bursal effusion

Joint effusion:  Absent

Bursae: Mild bilateral trochanteric bursitis.

Muscles and tendons

Muscles and tendons: Distal right gluteus medius and gluteus minimus
peritendinitis and tendinopathy. Partial tearing of the right
gluteus medius tendon is suspected. There is also potentially
partial tearing of the left gluteus medius tendon.

Other findings

Miscellaneous:   Trace free pelvic fluid.
IMPRESSION: 1. Moderate degenerative hip arthropathy bilaterally, with mild
bilateral trochanteric bursitis and right greater than left distal
gluteus medius and minimus peritendinitis and tendinopathy.
Potential partial tearing of the gluteus medius tendons bilaterally.
2. Blunted are truncated appearance of the right anterior labrum
making it difficult to exclude a chronic tear.
3. Trace free pelvic fluid, etiology uncertain.

## 2021-05-08 IMAGING — MR MR PELVIS W/O CM
4 of 5 series · 26 of 48 positions shown · non-contrast
Comparison: None.

CLINICAL DATA: Right hip pain starting 2 weeks ago while walking.
Questionable lucent lesion in the right femoral neck on radiography.

EXAM:
MRI PELVIS WITHOUT CONTRAST
TECHNIQUE: Multiplanar multisequence MR imaging of the pelvis was performed. No
intravenous contrast was administered.

[Series 3: STIR · coronal · right · 3.0mm · 1.25mm/px · 9 of 38 slices shown]
[im 1/38]
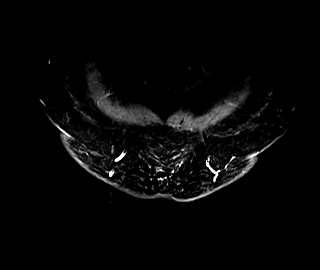
[im 5/38]
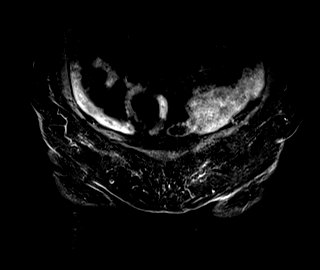
[im 10/38]
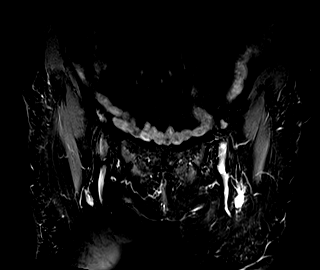
[im 14/38]
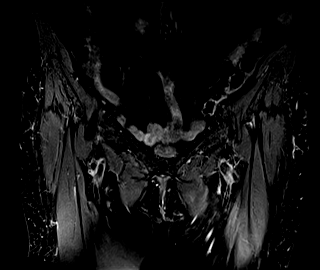
[im 19/38]
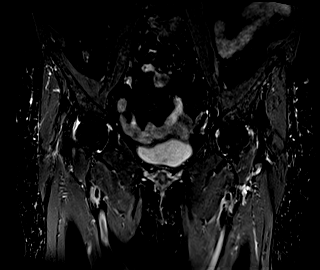
[im 24/38]
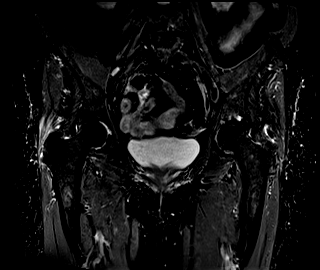
[im 28/38]
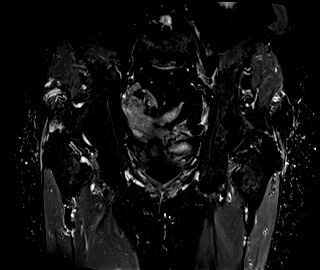
[im 33/38]
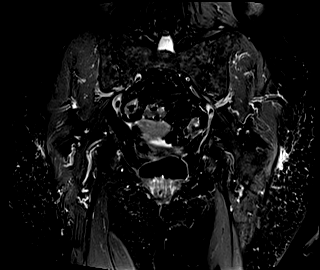
[im 38/38]
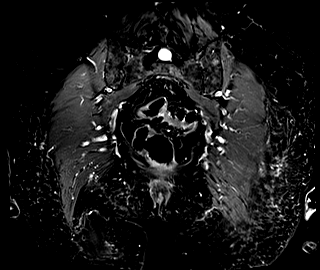

[Series 4: T1 · coronal · right · 3.0mm · 0.78mm/px · 8 of 38 slices shown (1 of 2)]
[im 1/38]
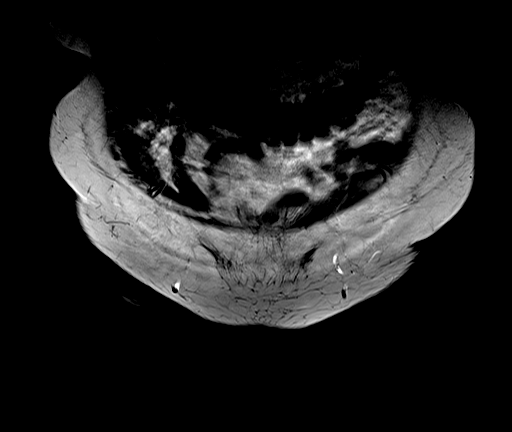
[im 5/38]
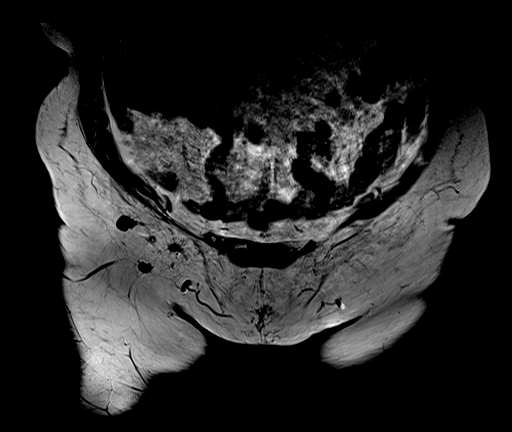
[im 13/38]
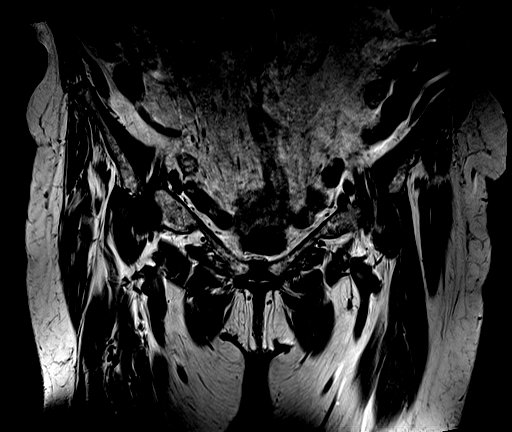
[im 17/38]
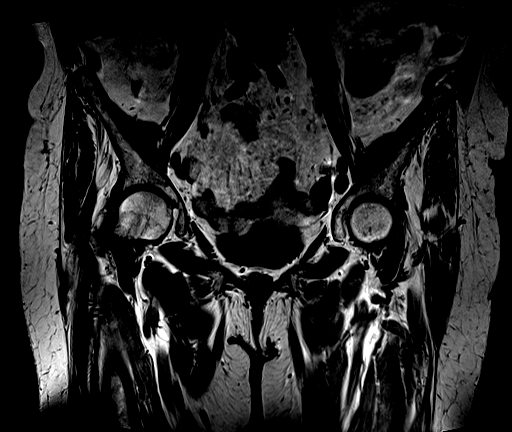
[im 21/38]
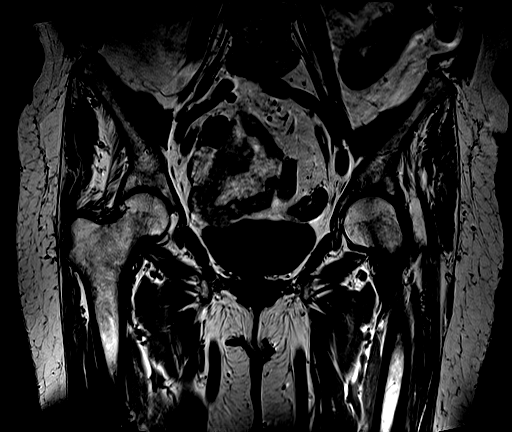
[im 25/38]
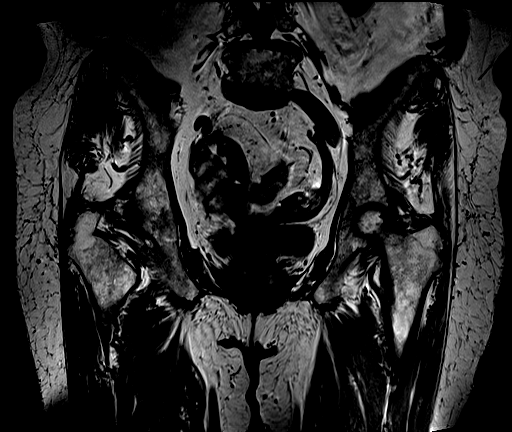
[im 33/38]
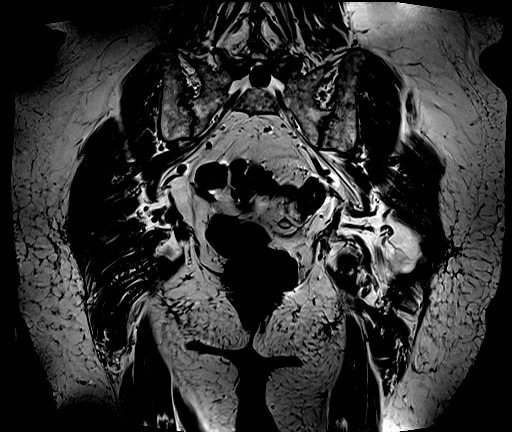
[im 38/38]
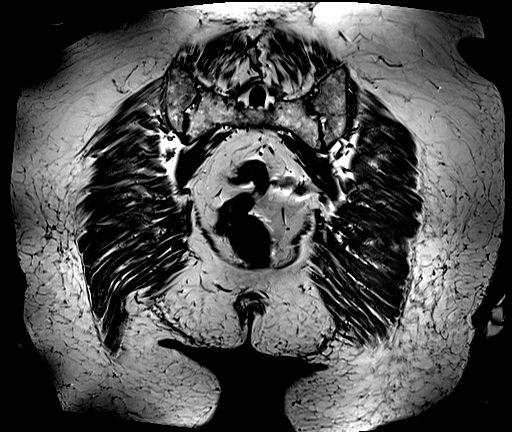

[Series 5: T1 · axial · right · 5.0mm · 0.78mm/px · z∈[-35,+133]mm · 6 of 30 slices shown (2 of 2)]
[im 1/30]
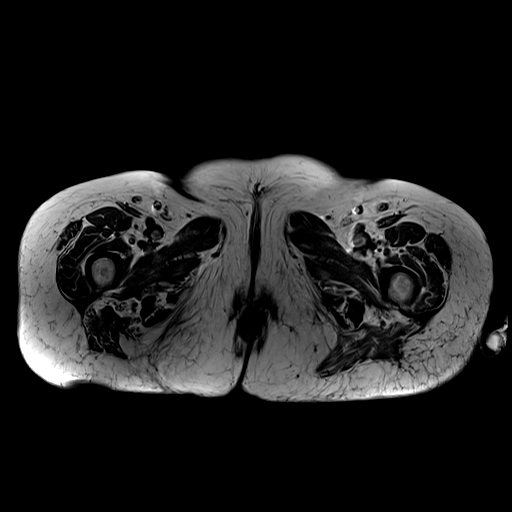
[im 5/30]
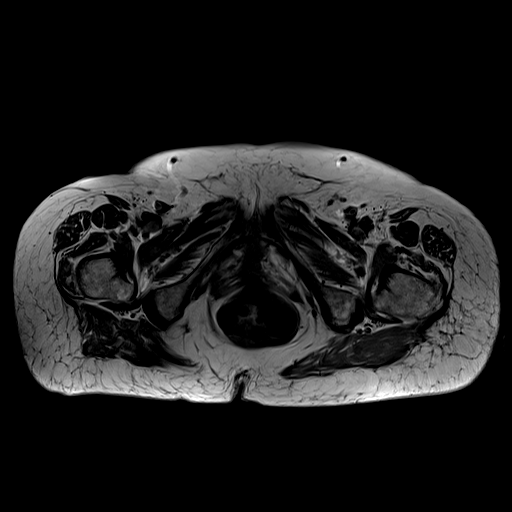
[im 9/30]
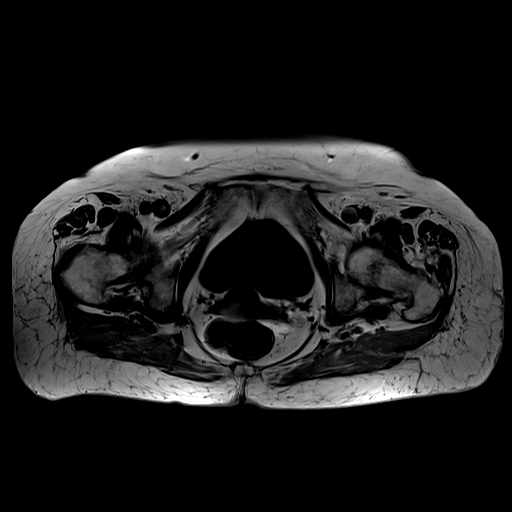
[im 13/30]
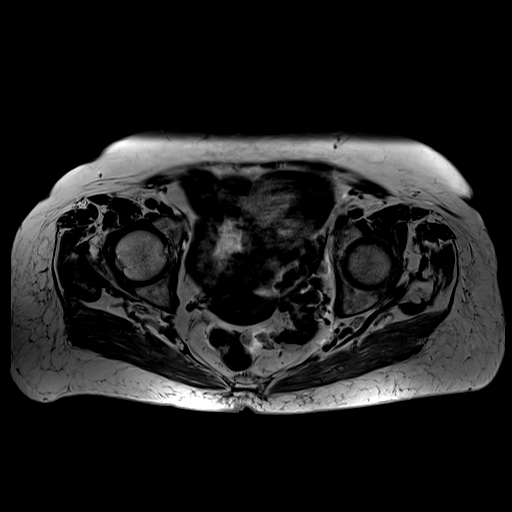
[im 17/30]
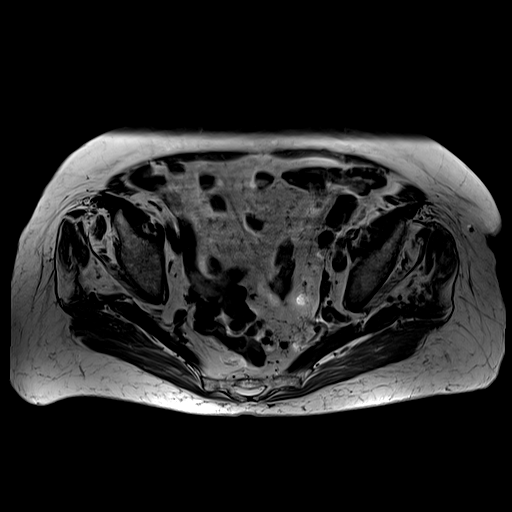
[im 25/30]
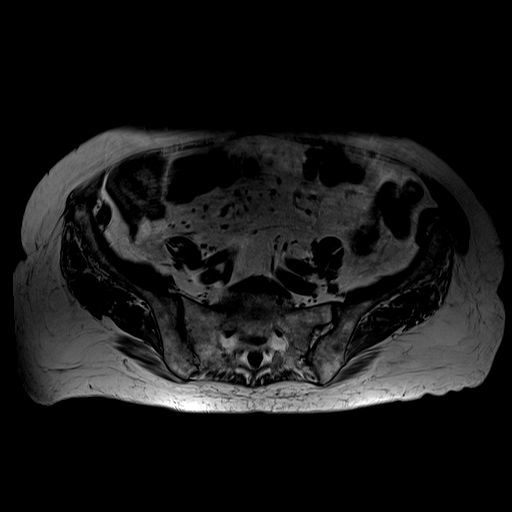

[Series 6: T2 fat-sat · axial · right · 5.0mm · 0.78mm/px · z∈[-7,+133]mm · 3 of 30 slices shown]
[im 5/30]
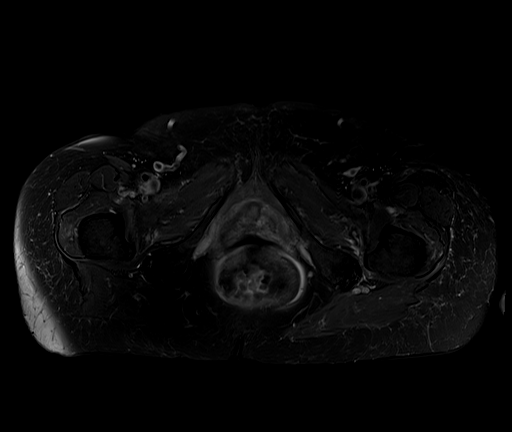
[im 17/30]
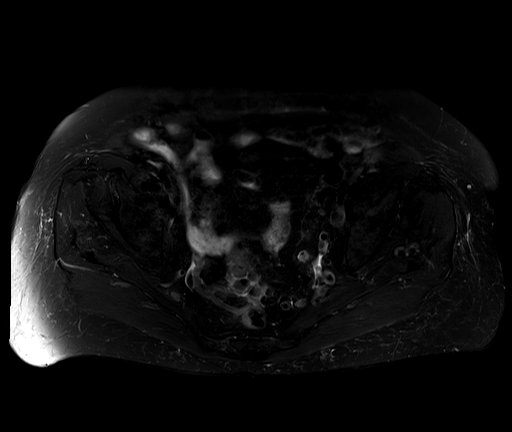
[im 25/30]
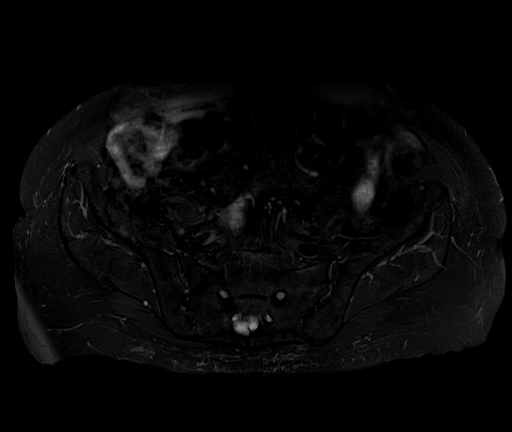

[26 of 48 positions shown; findings below may reference images not displayed]

Radiographs [DATE]

BONES AND JOINTS:
BONES AND JOINTS
Moderate degenerative arthropathy both hips with loss of articular
space. Scattered degenerative subcortical cystic lesions are present
along the left upper acetabulum. Pubic symphysis and visualized
portions of the SI joints unremarkable. There no lesion in the right
femoral neck to correspond with the questionable lucency on
radiography.

There is evidence of lumbar spondylosis and degenerative disc
disease at L4-5 and L5-S1. Grade 1 degenerative anterolisthesis at
L4-5 and L5-S1.

Bursae:

Trace bilateral trochanteric bursitis.

Muscles and tendons

Distal gluteus medius and gluteus minimus tendinopathy and
peritendinitis on the right. This is shown for example on image 17
series 6. A small amount of edema tracks in the gluteus medius
muscle.

Other findings

Sigmoid colon diverticulosis. Trace free pelvic fluid on image 24
series 7. Mildly retroverted uterus.
IMPRESSION: 1. Distal right gluteus medius and gluteus minimus tendinopathy and
peritendinitis. Adjacent mild trochanteric bursitis.
2. Trace left trochanteric bursitis is also present.
3. Moderate degenerative arthropathy of both hips, left greater than
right.
4. No lesion in the right femoral neck to correspond with the
questionable lucency on radiography.
5. Lumbar spondylosis and degenerative disc disease at L4-5 and
L5-S1 with grade 1 anterolisthesis at both levels.
6. Sigmoid colon diverticulosis.
7. Trace free pelvic fluid.

## 2021-05-08 MED ORDER — GADOBENATE DIMEGLUMINE 529 MG/ML IV SOLN
15.0000 mL | Freq: Once | INTRAVENOUS | Status: AC | PRN
  Administered 2021-05-08: 15 mL via INTRAVENOUS

## 2021-05-09 ENCOUNTER — Other Ambulatory Visit: Payer: Self-pay | Admitting: Family

## 2021-05-09 DIAGNOSIS — M25551 Pain in right hip: Secondary | ICD-10-CM

## 2021-05-09 DIAGNOSIS — M7061 Trochanteric bursitis, right hip: Secondary | ICD-10-CM

## 2021-05-11 ENCOUNTER — Telehealth: Payer: Self-pay

## 2021-05-11 NOTE — Telephone Encounter (Signed)
Patient called into the office and would like a call back regarding her MRI results.  ? ?Please advise  ?

## 2021-05-12 ENCOUNTER — Other Ambulatory Visit: Payer: Self-pay | Admitting: Cardiology

## 2021-05-12 DIAGNOSIS — I10 Essential (primary) hypertension: Secondary | ICD-10-CM

## 2021-05-16 ENCOUNTER — Telehealth: Payer: Self-pay | Admitting: Orthopedic Surgery

## 2021-05-16 NOTE — Telephone Encounter (Signed)
Patient's daughter Amy called asked if her mother can get a sooner appointment with Dr. August Saucer to go over the MRI. Amy asked what is the next plan of care for her mother?  The number to contact Amy is [phone] ?

## 2021-05-17 NOTE — Telephone Encounter (Signed)
I called.  She is got a little bit of partial tearing of the gluteus medius tendon but I do not think that a major player at this time.  I do not see any actionable findings on the MRI scan to explain her symptoms.  Do not see a surgical problem in the hip at this time.  I think it would be worth seeing her neurosurgeon/physiatrist at [facility] and potentially reimage the back and consider repeat injections in the back to see if it will help.  She is having some weakness in the leg but the findings that she has on the right hip do not really explain the symptoms she is having and I think it would be worth looking at the back as the next step and they will make that appointment

## 2021-05-17 NOTE — Progress Notes (Signed)
Please cancel upcoming appointment thank you I called the daughter

## 2021-05-24 ENCOUNTER — Other Ambulatory Visit: Payer: Self-pay | Admitting: Family

## 2021-05-24 DIAGNOSIS — M25551 Pain in right hip: Secondary | ICD-10-CM

## 2021-05-24 NOTE — Telephone Encounter (Signed)
The requested medication has a diagnosis of acute hip pain. Please advise if refills allowed and if so how many ? ?Side Note: request sent to [facility] as patient seen her on 4/12 for hip pain. Patient also seen the orthopedic physicial Dr.Dean on 05/01/21 ?

## 2021-06-07 ENCOUNTER — Ambulatory Visit (HOSPITAL_COMMUNITY)
Admission: RE | Admit: 2021-06-07 | Discharge: 2021-06-07 | Disposition: A | Discharge status: Home / Self Care | Payer: Medicare PPO | Source: Ambulatory Visit | Attending: Cardiology | Admitting: Cardiology

## 2021-06-07 DIAGNOSIS — I471 Supraventricular tachycardia: Secondary | ICD-10-CM | POA: Diagnosis not present

## 2021-06-07 DIAGNOSIS — I10 Essential (primary) hypertension: Secondary | ICD-10-CM | POA: Insufficient documentation

## 2021-06-07 DIAGNOSIS — I350 Nonrheumatic aortic (valve) stenosis: Secondary | ICD-10-CM | POA: Diagnosis present

## 2021-06-07 DIAGNOSIS — I083 Combined rheumatic disorders of mitral, aortic and tricuspid valves: Secondary | ICD-10-CM | POA: Diagnosis not present

## 2021-06-07 NOTE — Progress Notes (Signed)
  Echocardiogram 2D Echocardiogram has been performed.  Donna [name] M 06/07/2021, 2:15 PM

## 2021-06-08 LAB — ECHOCARDIOGRAM COMPLETE
AR max vel: 1.2 cm2
AV Area VTI: 1.33 cm2
AV Area mean vel: 1.09 cm2
AV Mean grad: 23 mmHg
AV Peak grad: 38.3 mmHg
Ao pk vel: 3.09 m/s
Area-P 1/2: 2.32 cm2
P 1/2 time: 500 msec
S' Lateral: 2.9 cm

## 2021-06-09 ENCOUNTER — Other Ambulatory Visit: Payer: Self-pay | Admitting: Cardiology

## 2021-06-09 DIAGNOSIS — I10 Essential (primary) hypertension: Secondary | ICD-10-CM

## 2021-06-15 ENCOUNTER — Encounter: Payer: Self-pay | Admitting: Cardiology

## 2021-06-15 ENCOUNTER — Ambulatory Visit: Payer: Medicare PPO | Admitting: Cardiology

## 2021-06-15 VITALS — BP 159/64 | HR 66 | Temp 98.0°F | Resp 16 | Ht 63.0 in | Wt 154.0 lb

## 2021-06-15 DIAGNOSIS — R0609 Other forms of dyspnea: Secondary | ICD-10-CM

## 2021-06-15 DIAGNOSIS — I10 Essential (primary) hypertension: Secondary | ICD-10-CM | POA: Diagnosis not present

## 2021-06-15 DIAGNOSIS — I350 Nonrheumatic aortic (valve) stenosis: Secondary | ICD-10-CM

## 2021-06-15 DIAGNOSIS — I471 Supraventricular tachycardia, unspecified: Secondary | ICD-10-CM

## 2021-06-15 DIAGNOSIS — Z87891 Personal history of nicotine dependence: Secondary | ICD-10-CM | POA: Diagnosis not present

## 2021-06-15 DIAGNOSIS — I351 Nonrheumatic aortic (valve) insufficiency: Secondary | ICD-10-CM

## 2021-06-15 MED ORDER — HYDRALAZINE HCL 25 MG PO TABS: 25.0000 mg | ORAL_TABLET | Freq: Three times a day (TID) | ORAL | 0 refills | Status: DC

## 2021-06-15 NOTE — Progress Notes (Signed)
ID:  Donna [name], DOB September 05, 1927, MRN [id number]  PCP:  [name], NP  Cardiologist:  Donna Kras, DO, [facility] (established care 08/26/2019)  Date: 06/15/21 Last Office Visit: 12/15/2020  Chief Complaint  Patient presents with   Hypertension   Follow-up    6 month Hypertension and PSVT Review echo results    HPI  Donna [name] is a 88 y.o. female whose past medical history and cardiovascular risk factors include: hypertension, mild OSA, supraventricular tachycardia, mixed aortic valve disease, mild MR, migraine, GI bleeding, postmenopausal female, advanced age.  In the past patient was referred to the practice for evaluation of palpitations.  She underwent cardiovascular work-up and was noted to have paroxysmal supraventricular tachycardia.  She was referred to Dr. Lovena Le for further evaluation and management and underwent EP study as well as radiofrequency ablation of her slow pathway and since then symptomatically has improved.  During her work-up of palpitations she also underwent an echocardiogram which noted mixed aortic valve disease with both stenosis and regurgitation.  However the severity of aortic stenosis was not significant based on invasive hemodynamics.  Since last office visit patient has been doing well from a cardiovascular standpoint.  Palpitations are very well managed on current medical therapy.  She has not experienced anginal discomfort, heart failure symptoms, or near syncope or syncopal events.  Recent echocardiogram performed at [facility] independently reviewed and noted below for further reference.  No significant change with regards to her valvular heart disease.  In the past patient has requested assistance with blood pressure management.  Her home blood pressure monitoring reviewed.  Home SBP ranges between 140-170 mmHg and pulse between 60-75 bpm. Medications reconciled.  Patient was also evaluated for sleep apnea.  She was diagnosed with mild OSA  during REM sleep and is positionally dependent, currently not on CPAP.  FUNCTIONAL STATUS: No structured exercise program or daily routine. 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    ALLERGIES: No Known Allergies  MEDICATION LIST PRIOR TO VISIT: Current Meds  Medication Sig   Blood Pressure Monitoring KIT Essential hypertension check bp daily   Calcium Carbonate-Vitamin D 600-400 MG-UNIT per tablet Take 1 tablet by mouth in the morning and at bedtime.   hydrALAZINE (APRESOLINE) 25 MG tablet Take 1 tablet (25 mg total) by mouth 3 (three) times daily.   levothyroxine (SYNTHROID) 100 MCG tablet TAKE 1 TABLET BY MOUTH EVERY DAY 30 MINUTES BEFORE BREAKFAST ON AN EMPTY STOMACH   Light Mineral Oil-Mineral Oil (RETAINE MGD) 0.5-0.5 % EMUL Place 1 drop into both eyes in the morning and at bedtime.   losartan (COZAAR) 100 MG tablet TAKE 1 TABLET(100 MG) BY MOUTH EVERY MORNING   Magnesium Glycinate 100 MG CAPS Take 200 mg by mouth daily.   meloxicam (MOBIC) 7.5 MG tablet TAKE 1 TABLET(7.5 MG) BY MOUTH DAILY   metoprolol succinate (TOPROL-XL) 25 MG 24 hr tablet TAKE 1 TABLET BY MOUTH EVERY MORNING, HOLD IF SYSTOLIC BLOOD PRESSURE(TOP NUMBER) LESS THAN 100 OR PULSE LES THAN 55 BPM   Multiple Vitamins-Minerals (PRESERVISION AREDS 2) CAPS Take 1 capsule by mouth 2 (two) times a day.    OVER THE COUNTER MEDICATION Take 2 capsules by mouth daily. MitoQ   vitamin B-12 (CYANOCOBALAMIN) 1000 MCG tablet Take 1 tablet (1,000 mcg total) by mouth daily.   [DISCONTINUED] hydrALAZINE (APRESOLINE) 10 MG tablet TAKE 1 TABLET(10 MG) BY MOUTH THREE TIMES DAILY   [DISCONTINUED] magnesium 30 MG  tablet Take one tab with dinner and prn at night  (if needed) for RLS     PAST MEDICAL HISTORY: Past Medical History:  Diagnosis Date   Aortic stenosis    Hemorrhage of gastrointestinal tract, unspecified    Hypertension    Migraine    Mitral regurgitation    OA (osteoarthritis)    Thyroid  cyst     PAST SURGICAL HISTORY: Past Surgical History:  Procedure Laterality Date   CATARACT EXTRACTION  1990   rt   CATARACT EXTRACTION  2005   left   LEFT HEART CATH AND CORONARY ANGIOGRAPHY N/A 10/06/2019   Procedure: LEFT HEART CATH AND CORONARY ANGIOGRAPHY;  Surgeon: Nigel Mormon, MD;  Location: [place] [facility];  Service: Cardiovascular;  Laterality: N/A;   REVISION TOTAL KNEE ARTHROPLASTY  2008   SVT ABLATION N/A 01/18/2020   Procedure: SVT ABLATION;  Surgeon: Evans Lance, MD;  Location: [place] [facility];  Service: Cardiovascular;  Laterality: N/A;   THUMB ARTHROSCOPY     TONSILLECTOMY      FAMILY HISTORY: The patient family history includes Arthritis in her sister; Brain cancer in her brother; CVA (age of onset: 37) in her sister; CVA (age of onset: 26) in her father; Heart disease in her sister; Pancreatic cancer in her mother.  SOCIAL HISTORY:  The patient  reports that she quit smoking about 73 years ago. Her smoking use included cigarettes. She has never used smokeless tobacco. She reports that she does not currently use alcohol. She reports that she does not use drugs.  REVIEW OF SYSTEMS: Review of Systems  Constitutional: Negative for chills and fever.  HENT:  Negative for hoarse voice and nosebleeds.   Eyes:  Negative for discharge, double vision and pain.  Cardiovascular:  Positive for dyspnea on exertion (improved). Negative for chest pain, claudication, leg swelling, near-syncope, orthopnea, palpitations, paroxysmal nocturnal dyspnea and syncope.  Respiratory:  Negative for hemoptysis and shortness of breath.   Musculoskeletal:  Negative for muscle cramps and myalgias.  Gastrointestinal:  Negative for abdominal pain, constipation, diarrhea, hematemesis, hematochezia, melena, nausea and vomiting.  Neurological:  Negative for dizziness, light-headedness and vertigo.   PHYSICAL EXAM:    06/15/2021   10:25 AM 06/15/2021   10:19 AM 04/26/2021    1:32 PM   Vitals with BMI  Height  5' 3" 5' 3"  Weight  154 lbs 157 lbs 13 oz  BMI  03.47 42.59  Systolic 563 875 643  Diastolic 64 61 70  Pulse 66 63 76   CONSTITUTIONAL: Well-developed and well-nourished. No acute distress.  SKIN: Skin is warm and dry. No rash noted. No cyanosis. No pallor. No jaundice HEAD: Normocephalic and atraumatic.  EYES: No scleral icterus MOUTH/THROAT: Moist oral membranes.  NECK: No JVD present. No thyromegaly noted.  Bilateral carotid bruits most likely secondary to delayed carotid upstrokes CHEST Normal respiratory effort. No intercostal retractions  LUNGS: Clear to auscultation bilaterally.  No stridor. No wheezes. No rales.  CARDIOVASCULAR: Regular, positive S1 delayed S2, 3 out of 6 crescendo decrescendo murmur heard at the second right intercostal space, with a component of holosystolic murmur at the apex. ABDOMINAL: Soft, nontender, nondistended, no apparent ascites.  EXTREMITIES: Trace bilateral peripheral edema.  HEMATOLOGIC: No significant bruising NEUROLOGIC: Oriented to person, place, and time. Nonfocal. Normal muscle tone.  PSYCHIATRIC: Normal mood and affect. Normal behavior. Cooperative  CARDIAC DATABASE: EKG: 06/15/2021: NSR, 64 bpm, poor R wave progression, consider old anteroseptal infarct.  Echocardiogram: 03/24/2020:  LVEF 60 to 65%, no regional wall motion abnormalities, mild LVH, normal diastolic function, normal right ventricular size and function, mild MR, no MS, mild aortic regurgitation, moderate aortic stenosis (mean gradient 25 mmHg, peak velocity 3.2 m/s), estimated RAP 3 mmHg.  Jun 07, 2021: LVEF 60 to 65%, moderate LVH, grade 2 diastolic dysfunction, elevated LVEDP, normal PASP, moderately dilated left atrium, mild to moderate MR, no mitral stenosis, moderate MAC, moderate aortic regurgitation, mild aortic stenosis (peak velocity 3.09 m/s, mean gradient 23 mmHg, AVA per VTI 1.33 cm, dimensional index 0.42).   Personally reviewed the  echo, LVEF is preserved, grade 2 diastolic dysfunction, elevated left ventricular filling pressures/LAP, mild/moderate aortic stenosis and mild regurgitation.  Estimated RAP 3 mmHg.  Stress Testing: None  Heart Catheterization: 10/06/2019: LM: Normal LAD: Mild luminal irregularities LCx: Normal RCA: Normal   LVEDP 14 mmHg 60 mmHg mid-cavitary gradient on pullback No significant LV-Ao gradient on pullback  Carotid artery duplex  09/09/2019:  Minimal stenosis in the right internal carotid artery (minimal).  Minimal stenosis in the left internal carotid artery (minimal).  Minimal plaque noted in the CCA bilaterally without significant stenosis.  Antegrade right vertebral artery flow. Antegrade left vertebral artery flow.  Follow up studies if clinically indicated.  7 day extended Holter monitor: Predominantly normal sinus rhythm (NSR).  HR 40-210 bpm.  Avg HR 79 bpm. Minimum HR 40 bpm on 08/29/19 at 1:02pm sinus with junctional escape. No atrial fibrillation/NSVT/pause (3 secs or longer). Total ventricular ectopic burden <1%. Total supraventricular ectopic burden <1%. 42 episodes of SVT.  Fastest and longest run was 1hr 17 mins with max HR 210 bpm (avg 155 bpm). Patient triggered events: 6.  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   LABORATORY DATA:    Latest Ref Rng & Units 02/20/2021   10:38 AM 04/22/2020    2:11 PM 01/11/2020   10:07 AM  CBC  WBC 3.8 - 10.8 Thousand/uL 5.4   6.2   6.2    Hemoglobin 11.7 - 15.5 g/dL 13.1   13.6   12.6    Hematocrit 35.0 - 45.0 % 38.4   42.3   37.0    Platelets 140 - 400 Thousand/uL 172   195   187         Latest Ref Rng & Units 02/28/2021   10:26 AM 02/20/2021   10:38 AM 03/22/2020   11:15 AM  CMP  Glucose 65 - 139 mg/dL 79   92   99    BUN 7 - 25 mg/dL _0 Creatinine 0.60 - 0.95 mg/dL 0.81   0.84   0.77    Sodium 135 - 146 mmol/L 130   130   137    Potassium 3.5 -  5.3 mmol/L 4.8   5.2   5.0    Chloride 98 - 110 mmol/L 97   97   100    CO2 20 - 32 mmol/L _1 Calcium 8.6 - 10.4 mg/dL 9.8   10.3   9.8    Total Protein 6.1 - 8.1 g/dL  5.7     Total Bilirubin 0.2 - 1.2 mg/dL  0.6     AST 10 - 35 U/L  13     ALT 6 - 29 U/L  15       Lipid Panel  Lab Results  Component  Value Date   CHOL 112 12/08/2018   HDL 34 (L) 12/08/2018   LDLCALC 62 12/08/2018   TRIG 80 12/08/2018   CHOLHDL 3.3 12/08/2018    No components found for: NTPROBNP No results for input(s): PROBNP in the last 8760 hours. Recent Labs    02/20/21 1038  TSH 2.79    BMP Recent Labs    02/20/21 1038 02/28/21 1026  NA 130* 130*  K 5.2 4.8  CL 97* 97*  CO2 30 30  GLUCOSE 92 79  BUN 20 19  CREATININE 0.84 0.81  CALCIUM 10.3 9.8    HEMOGLOBIN A1C Lab Results  Component Value Date   HGBA1C 5.1 08/22/2016   MPG 100 08/22/2016    IMPRESSION:    ICD-10-CM   1. Nonrheumatic aortic valve stenosis  I35.0 hydrALAZINE (APRESOLINE) 25 MG tablet    PCV ECHOCARDIOGRAM COMPLETE    2. Nonrheumatic aortic valve insufficiency  I35.1 PCV ECHOCARDIOGRAM COMPLETE    3. Essential hypertension, benign  I10 EKG 12-Lead    hydrALAZINE (APRESOLINE) 25 MG tablet    4. Paroxysmal SVT (supraventricular tachycardia) (HCC)  I47.1     5. Dyspnea on exertion  R06.09     6. Former smoker  Z87.891          RECOMMENDATIONS: Abelina Ketron is a 88 y.o. female whose past medical history and cardiac risk factors include: hypertension, mild OSA, supraventricular tachycardia, mixed aortic valve disease, mild MR, migraine, GI bleeding, postmenopausal female, advanced age.  Nonrheumatic aortic valve stenosis / aortic valve insufficiency Most recent echocardiogram from May 2023 independently reviewed. Patient has mild to moderate aortic stenosis per hemodynamics and mild aortic regurgitation. Denies angina pectoris, heart failure symptoms, near-syncope/syncope. Monitor for now. I  suspect the severity of valvular heart disease should improve with better blood pressure management. We will repeat an echocardiogram in May 2023 to follow disease progression.  Essential hypertension, benign Office blood pressures are currently not at goal. Discontinue hydralazine 10 mg p.o. 3 times daily. Increase hydralazine to 25 mg p.o. 3 times daily. Medications reconciled. Of note amlodipine was discontinued in the past due to lower extremity swelling Reemphasized importance of a low-salt diet. Patient is asked to call the office if her systolic blood pressures are still consistently greater than 130 mmHg.  Paroxysmal SVT (supraventricular tachycardia) (HCC) Underwent EP study and radiofrequency ablation of her slow pathway by Dr. Lovena Le. Has remained stable on Toprol-XL 25 mg p.o. daily since ablation. We will continue to monitor her peripherally.   She continues to follow with Dr. Lovena Le  Dyspnea on exertion Stable. Multifactorial: Valvular heart disease, seasonal allergies, hypertension. Valvular heart disease remains relatively stable as per the echo from March 2023. Blood pressure changes as discussed above, will continue to monitor. Patient is asked to discuss noncardiac causes of her dyspnea with PCP. Patient does have history of smoking in the past -not sure if she needs to be evaluated by pulmonary medicine or consider a trial of inhalers.  Will defer to PCP.  FINAL MEDICATION LIST END OF ENCOUNTER: Meds ordered this encounter  Medications   hydrALAZINE (APRESOLINE) 25 MG tablet    Sig: Take 1 tablet (25 mg total) by mouth 3 (three) times daily.    Dispense:  270 tablet    Refill:  0      Medications Discontinued During This Encounter  Medication Reason   magnesium 30 MG tablet Patient Preference   hydrALAZINE (APRESOLINE) 10 MG tablet Change in therapy  Current Outpatient Medications:    Blood Pressure Monitoring KIT, Essential hypertension check bp  daily, Disp: 1 kit, Rfl: 0   Calcium Carbonate-Vitamin D 600-400 MG-UNIT per tablet, Take 1 tablet by mouth in the morning and at bedtime., Disp: , Rfl:    hydrALAZINE (APRESOLINE) 25 MG tablet, Take 1 tablet (25 mg total) by mouth 3 (three) times daily., Disp: 270 tablet, Rfl: 0   levothyroxine (SYNTHROID) 100 MCG tablet, TAKE 1 TABLET BY MOUTH EVERY DAY 30 MINUTES BEFORE BREAKFAST ON AN EMPTY STOMACH, Disp: 90 tablet, Rfl: 3   Light Mineral Oil-Mineral Oil (RETAINE MGD) 0.5-0.5 % EMUL, Place 1 drop into both eyes in the morning and at bedtime., Disp: , Rfl:    losartan (COZAAR) 100 MG tablet, TAKE 1 TABLET(100 MG) BY MOUTH EVERY MORNING, Disp: 90 tablet, Rfl: 1   Magnesium Glycinate 100 MG CAPS, Take 200 mg by mouth daily., Disp: , Rfl:    meloxicam (MOBIC) 7.5 MG tablet, TAKE 1 TABLET(7.5 MG) BY MOUTH DAILY, Disp: 30 tablet, Rfl: 3   metoprolol succinate (TOPROL-XL) 25 MG 24 hr tablet, TAKE 1 TABLET BY MOUTH EVERY MORNING, HOLD IF SYSTOLIC BLOOD PRESSURE(TOP NUMBER) LESS THAN 100 OR PULSE LES THAN 55 BPM, Disp: 90 tablet, Rfl: 1   Multiple Vitamins-Minerals (PRESERVISION AREDS 2) CAPS, Take 1 capsule by mouth 2 (two) times a day. , Disp: , Rfl:    OVER THE COUNTER MEDICATION, Take 2 capsules by mouth daily. MitoQ, Disp: , Rfl:    vitamin B-12 (CYANOCOBALAMIN) 1000 MCG tablet, Take 1 tablet (1,000 mcg total) by mouth daily., Disp: 30 tablet, Rfl: 5  Orders Placed This Encounter  Procedures   EKG 12-Lead   PCV ECHOCARDIOGRAM COMPLETE   There are no Patient Instructions on file for this visit.   --Continue cardiac medications as reconciled in final medication list. --Return in about 6 months (around 12/15/2021) for Follow up mixed aortic valve disease, PSVT.. Or sooner if needed. --Continue follow-up with your primary care physician regarding the management of your other chronic comorbid conditions.  Patient's questions and concerns were addressed to her satisfaction. She voices understanding  of the instructions provided during this encounter.   This note was created using a voice recognition software as a result there may be grammatical errors inadvertently enclosed that do not reflect the nature of this encounter. Every attempt is made to correct such errors.  Donna [name], [place], [facility]  Pager: [phone] Office: [phone]

## 2021-06-22 DIAGNOSIS — H52223 Regular astigmatism, bilateral: Secondary | ICD-10-CM | POA: Diagnosis not present

## 2021-06-22 DIAGNOSIS — H04123 Dry eye syndrome of bilateral lacrimal glands: Secondary | ICD-10-CM | POA: Diagnosis not present

## 2021-06-22 DIAGNOSIS — H02831 Dermatochalasis of right upper eyelid: Secondary | ICD-10-CM | POA: Diagnosis not present

## 2021-06-22 DIAGNOSIS — H43813 Vitreous degeneration, bilateral: Secondary | ICD-10-CM | POA: Diagnosis not present

## 2021-06-22 DIAGNOSIS — H524 Presbyopia: Secondary | ICD-10-CM | POA: Diagnosis not present

## 2021-06-22 DIAGNOSIS — H353134 Nonexudative age-related macular degeneration, bilateral, advanced atrophic with subfoveal involvement: Secondary | ICD-10-CM | POA: Diagnosis not present

## 2021-06-22 DIAGNOSIS — H5213 Myopia, bilateral: Secondary | ICD-10-CM | POA: Diagnosis not present

## 2021-08-07 ENCOUNTER — Other Ambulatory Visit: Payer: Self-pay | Admitting: Cardiology

## 2021-08-07 DIAGNOSIS — I471 Supraventricular tachycardia: Secondary | ICD-10-CM

## 2021-08-07 DIAGNOSIS — I350 Nonrheumatic aortic (valve) stenosis: Secondary | ICD-10-CM

## 2021-08-12 ENCOUNTER — Other Ambulatory Visit: Payer: Self-pay | Admitting: Cardiology

## 2021-08-12 DIAGNOSIS — I10 Essential (primary) hypertension: Secondary | ICD-10-CM

## 2021-08-21 ENCOUNTER — Ambulatory Visit: Payer: Medicare PPO | Admitting: Nurse Practitioner

## 2021-08-21 ENCOUNTER — Encounter: Payer: Self-pay | Admitting: Nurse Practitioner

## 2021-08-21 VITALS — BP 132/80 | HR 65 | Temp 97.3°F | Ht 63.0 in | Wt 154.0 lb

## 2021-08-21 DIAGNOSIS — R053 Chronic cough: Secondary | ICD-10-CM | POA: Diagnosis not present

## 2021-08-21 DIAGNOSIS — G8929 Other chronic pain: Secondary | ICD-10-CM | POA: Diagnosis not present

## 2021-08-21 DIAGNOSIS — I10 Essential (primary) hypertension: Secondary | ICD-10-CM | POA: Diagnosis not present

## 2021-08-21 DIAGNOSIS — M545 Low back pain, unspecified: Secondary | ICD-10-CM

## 2021-08-21 DIAGNOSIS — Z66 Do not resuscitate: Secondary | ICD-10-CM

## 2021-08-21 DIAGNOSIS — E871 Hypo-osmolality and hyponatremia: Secondary | ICD-10-CM

## 2021-08-21 DIAGNOSIS — E039 Hypothyroidism, unspecified: Secondary | ICD-10-CM | POA: Diagnosis not present

## 2021-08-21 NOTE — Progress Notes (Signed)
Careteam: Patient Care Team: [name], NP as PCP - General (Geriatric Medicine) [name], OD as Consulting Physician (Optometry) Caffie Pinto., MD as Referring Physician (Orthopedic Surgery) Rex Kras, DO as Referring Physician (Cardiology)  PLACE OF SERVICE:  [facility] Directive information Does Patient Have a Medical Advance Directive?: Yes, Type of Advance Directive: [place];Out of facility DNR (pink MOST or yellow form), Pre-existing out of facility DNR order (yellow form or pink MOST form): Yellow form placed in chart (order not valid for inpatient use), Does patient want to make changes to medical advance directive?: No - Patient declined  No Known Allergies  Chief Complaint  Patient presents with   Medical Management of Chronic Issues    6 month follow-up. Discuss need for additional coivd boosters and flu vaccine (not in stock) or post pone if patient refuses.      HPI: Patient is a 88 y.o. female who present today for routine follow up. Her only acute complaint is her rt hip pain. She was recently seen in April by orthopaedist and underwent mri (may) in which MD didn't think finding were consistent with pt symptoms and advised her to follow-up with her neurosurgeon to potentially reimage the back and repeat injections. She wants a new back specialist and already has someone in mind. She will call to make appt and let us know if she needs a referral.   Sleep has been poor and attributes it to pain. Has no problems with falling asleep but will often wake up in the night. Takes mobic with dinner and states that it helps.   Continues to have a dry cough that has been present for a while. She thought it was due to her blood pressure medication but states her cardiologist assured her it was not.   Diet is ok. Doesn't eat as many vegetables because she doesn't cook much, but she does think she has a balanced diet.   Exercise  is limited due to hip pain, recommended water aerobics in which she has no interest.    Denies chest pain and palpitations. Follows with cardiology and is seen twice a year.  Still follows with cardiology sees him twice a year no palpitation.   Review of Systems:  Review of Systems  Constitutional:  Negative for chills, fever and weight loss.  HENT:  Negative for ear discharge, ear pain and hearing loss.   Eyes:  Negative for blurred vision and double vision.  Respiratory:  Positive for cough. Negative for shortness of breath and wheezing.        Chronic dry cough  Cardiovascular:  Negative for chest pain, palpitations and leg swelling.  Gastrointestinal:  Negative for abdominal pain, heartburn, nausea and vomiting.  Genitourinary:  Negative for dysuria and hematuria.  Musculoskeletal:  Positive for joint pain and myalgias.       Hip pain   Skin:  Negative for itching and rash.  Neurological:  Negative for dizziness, tingling and headaches.  Endo/Heme/Allergies:  Negative for polydipsia.  Psychiatric/Behavioral:  Negative for depression. The patient is not nervous/anxious.     Past Medical History:  Diagnosis Date   Aortic stenosis    Hemorrhage of gastrointestinal tract, unspecified    Hypertension    Migraine    Mitral regurgitation    OA (osteoarthritis)    Thyroid cyst    Past Surgical History:  Procedure Laterality Date   CATARACT EXTRACTION  1990   rt  CATARACT EXTRACTION  2005   left   LEFT HEART CATH AND CORONARY ANGIOGRAPHY N/A 10/06/2019   Procedure: LEFT HEART CATH AND CORONARY ANGIOGRAPHY;  Surgeon: Nigel Mormon, MD;  Location: [facility];  Service: Cardiovascular;  Laterality: N/A;   REVISION TOTAL KNEE ARTHROPLASTY  2008   SVT ABLATION N/A 01/18/2020   Procedure: SVT ABLATION;  Surgeon: Evans Lance, MD;  Location: [facility];  Service: Cardiovascular;  Laterality: N/A;   THUMB ARTHROSCOPY     TONSILLECTOMY     Social History:    reports that she quit smoking about 73 years ago. Her smoking use included cigarettes. She has never used smokeless tobacco. She reports that she does not currently use alcohol. She reports that she does not use drugs.  Family History  Problem Relation Age of Onset   CVA Father 5   Pancreatic cancer Mother    CVA Sister 83   Brain cancer Brother    Arthritis Sister    Heart disease Sister    Breast cancer Neg Hx     Medications: Patient's Medications  New Prescriptions   No medications on file  Previous Medications   BLOOD PRESSURE MONITORING KIT    Essential hypertension check bp daily   CALCIUM CARBONATE-VITAMIN D 600-400 MG-UNIT PER TABLET    Take 1 tablet by mouth in the morning and at bedtime.   HYDRALAZINE (APRESOLINE) 25 MG TABLET    Take 1 tablet (25 mg total) by mouth 3 (three) times daily.   LEVOTHYROXINE (SYNTHROID) 100 MCG TABLET    TAKE 1 TABLET BY MOUTH EVERY DAY 30 MINUTES BEFORE BREAKFAST ON AN EMPTY STOMACH   LIGHT MINERAL OIL-MINERAL OIL (RETAINE MGD) 0.5-0.5 % EMUL    Place 1 drop into both eyes in the morning and at bedtime.   LOSARTAN (COZAAR) 100 MG TABLET    TAKE 1 TABLET(100 MG) BY MOUTH EVERY MORNING   MAGNESIUM GLYCINATE 100 MG CAPS    Take 200 mg by mouth daily.   MELOXICAM (MOBIC) 7.5 MG TABLET    TAKE 1 TABLET(7.5 MG) BY MOUTH DAILY   METOPROLOL SUCCINATE (TOPROL-XL) 25 MG 24 HR TABLET    TAKE 1 TABLET BY MOUTH EVERY MORNING, HOLD IF SYSTOLIC BLOOD PRESSURE LESS THAN 100 OR PULSE LESS THAN 55   MULTIPLE VITAMINS-MINERALS (PRESERVISION AREDS 2) CAPS    Take 1 capsule by mouth 2 (two) times a day.    OVER THE COUNTER MEDICATION    Take 2 capsules by mouth daily. MitoQ   VITAMIN B-12 (CYANOCOBALAMIN) 1000 MCG TABLET    Take 1 tablet (1,000 mcg total) by mouth daily.  Modified Medications   No medications on file  Discontinued Medications   No medications on file    Physical Exam:  Vitals:   08/21/21 1008  BP: 132/80  Pulse: 65  Temp: (!) 97.3 F  (36.3 C)  TempSrc: Temporal  SpO2: 96%  Weight: 69.9 kg  Height: _0  (1.6 m)   Body mass index is 27.28 kg/m. Wt Readings from Last 3 Encounters:  08/21/21 69.9 kg  06/15/21 69.9 kg  04/26/21 71.6 kg    Physical Exam Constitutional:      General: She is not in acute distress.    Appearance: She is not toxic-appearing.  HENT:     Right Ear: Tympanic membrane, ear canal and external ear normal. There is no impacted cerumen.     Left Ear: Tympanic membrane, ear canal and external ear  normal. There is no impacted cerumen.     Nose: No congestion.     Mouth/Throat:     Mouth: Mucous membranes are moist.  Eyes:     Conjunctiva/sclera: Conjunctivae normal.     Pupils: Pupils are equal, round, and reactive to light.  Cardiovascular:     Rate and Rhythm: Normal rate and regular rhythm.     Pulses: Normal pulses.     Heart sounds: Murmur heard.  Pulmonary:     Effort: Pulmonary effort is normal. No respiratory distress.     Breath sounds: Normal breath sounds.  Abdominal:     General: Bowel sounds are normal. There is no distension.     Palpations: Abdomen is soft.     Tenderness: There is no abdominal tenderness.  Musculoskeletal:     Right lower leg: No edema.     Left lower leg: No edema.  Skin:    General: Skin is warm and dry.  Neurological:     Mental Status: She is alert and oriented to person, place, and time. Mental status is at baseline.     Gait: Gait abnormal.     Comments: Ambulates w/cane  Psychiatric:        Mood and Affect: Mood normal.        Behavior: Behavior normal.        Thought Content: Thought content normal.     Labs reviewed: Basic Metabolic Panel: Recent Labs    02/20/21 1038 02/28/21 1026  NA 130* 130*  K 5.2 4.8  CL 97* 97*  CO2 30 30  GLUCOSE 92 79  BUN 20 19  CREATININE 0.84 0.81  CALCIUM 10.3 9.8  TSH 2.79  --    Liver Function Tests: Recent Labs    02/20/21 1038  AST 13  ALT 15  BILITOT 0.6  PROT 5.7*   No  results for input(s): "LIPASE", "AMYLASE" in the last 8760 hours. No results for input(s): "AMMONIA" in the last 8760 hours. CBC: Recent Labs    02/20/21 1038  WBC 5.4  NEUTROABS 3,191  HGB 13.1  HCT 38.4  MCV 89.7  PLT 172   Lipid Panel: No results for input(s): "CHOL", "HDL", "LDLCALC", "TRIG", "CHOLHDL", "LDLDIRECT" in the last 8760 hours. TSH: Recent Labs    02/20/21 1038  TSH 2.79   A1C: Lab Results  Component Value Date   HGBA1C 5.1 08/22/2016     Assessment/Plan   1. Essential hypertension, benign - stable  -continue losartan,hydralazine, and metoprolol with dietary modifications.  - CBC with Differential/Platelet - CMP with eGFR([facility])  2. Do not resuscitate - Do not attempt resuscitation (DNR)  3. Hyponatremia - Repeat lab at this visit  4. Chronic bilateral low back pain without sciatica - pt will follow up with her back specialist -to use caution when taking mobic, will follow labs.  - states she does therapy exercises that were recommended by her daughter who is a physical therapist, encouraged to continue.  5. Hypothyroidism, unspecified type - Continue synthroid  - TSH 2.79 (02/2021) - recheck at next visit  6. Chronic cough Ongoing, consider PPI   Return in about 6 months (around 02/21/2022) for routine follow up.  Student- Waunita Schooner, RN I personally was present during the history, physical exam and medical decision-making activities of this service and have verified that the service and findings are accurately documented in the student's note Ailey Wessling K. [place], [facility] [phone]

## 2021-08-22 LAB — CBC WITH DIFFERENTIAL/PLATELET
Absolute Monocytes: 705 cells/uL (ref 200–950)
Basophils Absolute: 19 cells/uL (ref 0–200)
Basophils Relative: 0.4 %
Eosinophils Absolute: 99 cells/uL (ref 15–500)
Eosinophils Relative: 2.1 %
HCT: 38.2 % (ref 35.0–45.0)
Hemoglobin: 13.1 g/dL (ref 11.7–15.5)
Lymphs Abs: 1194 cells/uL (ref 850–3900)
MCH: 30.4 pg (ref 27.0–33.0)
MCHC: 34.3 g/dL (ref 32.0–36.0)
MCV: 88.6 fL (ref 80.0–100.0)
MPV: 9.9 fL (ref 7.5–12.5)
Monocytes Relative: 15 %
Neutro Abs: 2684 cells/uL (ref 1500–7800)
Neutrophils Relative %: 57.1 %
Platelets: 148 10*3/uL (ref 140–400)
RBC: 4.31 10*6/uL (ref 3.80–5.10)
RDW: 13.2 % (ref 11.0–15.0)
Total Lymphocyte: 25.4 %
WBC: 4.7 10*3/uL (ref 3.8–10.8)

## 2021-08-22 LAB — COMPLETE METABOLIC PANEL WITH GFR
AG Ratio: 2.5 (calc) (ref 1.0–2.5)
ALT: 17 U/L (ref 6–29)
AST: 15 U/L (ref 10–35)
Albumin: 4.2 g/dL (ref 3.6–5.1)
Alkaline phosphatase (APISO): 44 U/L (ref 37–153)
BUN: 18 mg/dL (ref 7–25)
CO2: 28 mmol/L (ref 20–32)
Calcium: 9.9 mg/dL (ref 8.6–10.4)
Chloride: 98 mmol/L (ref 98–110)
Creat: 0.79 mg/dL (ref 0.60–0.95)
Globulin: 1.7 g/dL (calc) — ABNORMAL LOW (ref 1.9–3.7)
Glucose, Bld: 86 mg/dL (ref 65–99)
Potassium: 5 mmol/L (ref 3.5–5.3)
Sodium: 132 mmol/L — ABNORMAL LOW (ref 135–146)
Total Bilirubin: 0.6 mg/dL (ref 0.2–1.2)
Total Protein: 5.9 g/dL — ABNORMAL LOW (ref 6.1–8.1)
eGFR: 71 mL/min/{1.73_m2} (ref >=60)

## 2021-08-30 DIAGNOSIS — H353114 Nonexudative age-related macular degeneration, right eye, advanced atrophic with subfoveal involvement: Secondary | ICD-10-CM | POA: Diagnosis not present

## 2021-08-30 DIAGNOSIS — H43813 Vitreous degeneration, bilateral: Secondary | ICD-10-CM | POA: Diagnosis not present

## 2021-08-30 DIAGNOSIS — H353123 Nonexudative age-related macular degeneration, left eye, advanced atrophic without subfoveal involvement: Secondary | ICD-10-CM | POA: Diagnosis not present

## 2021-09-18 ENCOUNTER — Other Ambulatory Visit: Payer: Self-pay | Admitting: Cardiology

## 2021-09-18 DIAGNOSIS — I10 Essential (primary) hypertension: Secondary | ICD-10-CM

## 2021-09-18 DIAGNOSIS — I350 Nonrheumatic aortic (valve) stenosis: Secondary | ICD-10-CM

## 2021-09-20 ENCOUNTER — Other Ambulatory Visit: Payer: Self-pay | Admitting: Family

## 2021-09-20 DIAGNOSIS — M25551 Pain in right hip: Secondary | ICD-10-CM

## 2021-09-20 DIAGNOSIS — L603 Nail dystrophy: Secondary | ICD-10-CM | POA: Diagnosis not present

## 2021-10-06 ENCOUNTER — Encounter: Payer: Self-pay | Admitting: Nurse Practitioner

## 2021-10-06 ENCOUNTER — Ambulatory Visit (INDEPENDENT_AMBULATORY_CARE_PROVIDER_SITE_OTHER): Payer: Medicare PPO | Admitting: Nurse Practitioner

## 2021-10-06 DIAGNOSIS — Z0000 Encounter for general adult medical examination without abnormal findings: Secondary | ICD-10-CM | POA: Diagnosis not present

## 2021-10-06 NOTE — Progress Notes (Signed)
Subjective:   Donna [name] is a 88 y.o. female who presents for Medicare Annual (Subsequent) preventive examination.  Review of Systems     Cardiac Risk Factors include: advanced age (>21mn, >>53women);hypertension     Objective:    There were no vitals filed for this visit. There is no height or weight on file to calculate BMI.     10/06/2021    3:13 PM 08/21/2021    9:51 AM 04/26/2021    1:37 PM 02/20/2021   10:05 AM 09/27/2020    3:55 PM 09/22/2020    9:45 AM 08/24/2020   10:00 AM  Advanced Directives  Does Patient Have a Medical Advance Directive? Yes Yes Yes Yes Yes Yes Yes  Type of AParamedicof AMasonOut of facility DNR (pink MOST or yellow form) HOkemahOut of facility DNR (pink MOST or yellow form) HSt. PeterOut of facility DNR (pink MOST or yellow form) HGeorgetownOut of facility DNR (pink MOST or yellow form) HCorozalOut of facility DNR (pink MOST or yellow form) HWaggonerLiving will HLebanonOut of facility DNR (pink MOST or yellow form)  Does patient want to make changes to medical advance directive? No - Patient declined No - Patient declined No - Patient declined No - Patient declined No - Patient declined  No - Patient declined  Copy of HGarrisonin Chart? Yes - validated most recent copy scanned in chart (See row information) Yes - validated most recent copy scanned in chart (See row information) Yes - validated most recent copy scanned in chart (See row information) Yes - validated most recent copy scanned in chart (See row information) Yes - validated most recent copy scanned in chart (See row information) No - copy requested Yes - validated most recent copy scanned in chart (See row information)  Pre-existing out of facility DNR order (yellow form or pink MOST form)  Yellow form placed in chart (order not valid  for inpatient use)  Yellow form placed in chart (order not valid for inpatient use) Yellow form placed in chart (order not valid for inpatient use)  Yellow form placed in chart (order not valid for inpatient use)    Current Medications (verified) Outpatient Encounter Medications as of 10/06/2021  Medication Sig   Blood Pressure Monitoring KIT Essential hypertension check bp daily   Calcium Carbonate-Vitamin D 600-400 MG-UNIT per tablet Take 1 tablet by mouth in the morning and at bedtime.   hydrALAZINE (APRESOLINE) 25 MG tablet TAKE 1 TABLET(25 MG) BY MOUTH THREE TIMES DAILY   levothyroxine (SYNTHROID) 100 MCG tablet TAKE 1 TABLET BY MOUTH EVERY DAY 30 MINUTES BEFORE BREAKFAST ON AN EMPTY STOMACH   Light Mineral Oil-Mineral Oil (RETAINE MGD) 0.5-0.5 % EMUL Place 1 drop into both eyes in the morning and at bedtime.   losartan (COZAAR) 100 MG tablet TAKE 1 TABLET(100 MG) BY MOUTH EVERY MORNING   Magnesium Glycinate 100 MG CAPS Take 200 mg by mouth daily.   meloxicam (MOBIC) 7.5 MG tablet TAKE 1 TABLET(7.5 MG) BY MOUTH DAILY   metoprolol succinate (TOPROL-XL) 25 MG 24 hr tablet TAKE 1 TABLET BY MOUTH EVERY MORNING, HOLD IF SYSTOLIC BLOOD PRESSURE LESS THAN 100 OR PULSE LESS THAN 55   Multiple Vitamins-Minerals (PRESERVISION AREDS 2) CAPS Take 1 capsule by mouth 2 (two) times a day.    OVER THE COUNTER MEDICATION Take 2 capsules by mouth daily.  MitoQ   vitamin B-12 (CYANOCOBALAMIN) 1000 MCG tablet Take 1 tablet (1,000 mcg total) by mouth daily.   No facility-administered encounter medications on file as of 10/06/2021.    Allergies (verified) Patient has no known allergies.   History: Past Medical History:  Diagnosis Date   Aortic stenosis    Hemorrhage of gastrointestinal tract, unspecified    Hypertension    Migraine    Mitral regurgitation    OA (osteoarthritis)    Thyroid cyst    Past Surgical History:  Procedure Laterality Date   CATARACT EXTRACTION  1990   rt   CATARACT  EXTRACTION  2005   left   LEFT HEART CATH AND CORONARY ANGIOGRAPHY N/A 10/06/2019   Procedure: LEFT HEART CATH AND CORONARY ANGIOGRAPHY;  Surgeon: Nigel Mormon, MD;  Location: [place] [facility];  Service: Cardiovascular;  Laterality: N/A;   REVISION TOTAL KNEE ARTHROPLASTY  2008   SVT ABLATION N/A 01/18/2020   Procedure: SVT ABLATION;  Surgeon: Evans Lance, MD;  Location: [facility];  Service: Cardiovascular;  Laterality: N/A;   THUMB ARTHROSCOPY     TONSILLECTOMY     Family History  Problem Relation Age of Onset   CVA Father 76   Pancreatic cancer Mother    CVA Sister 60   Brain cancer Brother    Arthritis Sister    Heart disease Sister    Breast cancer Neg Hx    Social History   Socioeconomic History   Marital status: Widowed    Spouse name: Not on file   Number of children: 2   Years of education: 53   Highest education level: Not on file  Occupational History   Not on file  Tobacco Use   Smoking status: Former    Years: 2.00    Types: Cigarettes    Quit date: 01/16/1948    Years since quitting: 73.7   Smokeless tobacco: Never   Tobacco comments:    smoked on 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   Vaping Use: Never used  Substance and Sexual Activity   Alcohol use: Not Currently   Drug use: No   Sexual activity: Not on file  Other Topics Concern   Not on file  Social History Narrative   Right handed   Decaf coffee, tea sometimes   Lives alone   Social Determinants of Health   Financial Resource Strain: Low Risk  (09/11/2017)   Overall Financial Resource Strain (CARDIA)    Difficulty of Paying Living Expenses: Not hard at all  Food Insecurity: No Food Insecurity (09/11/2017)   Hunger Vital Sign    Worried About Running Out of Food in the Last Year: Never true    [place] in the Last Year: Never true  Transportation Needs: No Transportation Needs (09/11/2017)   PRAPARE - Hydrologist (Medical): No    Lack  of Transportation (Non-Medical): No  Physical Activity: Inactive (09/11/2017)   Exercise Vital Sign    Days of Exercise per Week: 0 days    Minutes of Exercise per Session: 0 min  Stress: Stress Concern Present (09/11/2017)   [place]    Feeling of Stress : To some extent  Social Connections: Moderately Integrated (09/11/2017)   Social Connection and Isolation Panel [NHANES]    Frequency of Communication with Friends and Family: More than three times a week    Frequency  of Social Gatherings with Friends and Family: More than three times a week    Attends Religious Services: More than 4 times per year    Active Member of [employer] or Organizations: No    Attends Music therapist: Never    Marital Status: Married    Tobacco Counseling Counseling given: Not Answered Tobacco comments: smoked on occ when she was a teen   Clinical Intake:  Pre-visit preparation completed: Yes  Pain : 0-10 Pain Type: Chronic pain Pain Location: Back Pain Orientation: Lower     BMI - recorded: 27 Nutritional Status: BMI 25 -29 Overweight Nutritional Risks: None Diabetes: No  How often do you need to have someone help you when you read instructions, pamphlets, or other written materials from your doctor or pharmacy?: 1 - Never  Diabetic?no         Activities of Daily Living    10/06/2021    3:36 PM  In your present state of health, do you have any difficulty performing the following activities:  Hearing? 0  Vision? 0  Difficulty concentrating or making decisions? 1  Walking or climbing stairs? 1  Comment trouble climbing stairs  Dressing or bathing? 0  Doing errands, shopping? 1  Comment decreased in stamina  Preparing Food and eating ? N  Using the Toilet? N  In the past six months, have you accidently leaked urine? N  Do you have problems with loss of bowel control? N  Managing your Medications? N   Managing your Finances? N  Housekeeping or managing your Housekeeping? N    Patient Care Team: [name], NP as PCP - General (Geriatric Medicine) [name], OD as Consulting Physician (Optometry) Sallyanne Havers Charlotta Newton., MD as Referring Physician (Orthopedic Surgery) Rex Kras, DO as Referring Physician (Cardiology)  Indicate any recent Medical Services you may have received from other than [facility] providers in the past year (date may be approximate).     Assessment:   This is a routine wellness examination for [place].  Hearing/Vision screen Hearing Screening - Comments:: No Hearing Concerns.   Dietary issues and exercise activities discussed: Current Exercise Habits: The patient does not participate in regular exercise at present   Goals Addressed   None    Depression Screen    10/06/2021    3:10 PM 08/21/2021   10:09 AM 09/27/2020    3:52 PM 08/24/2020    9:59 AM 04/22/2020    1:38 PM 12/21/2019   11:07 AM 11/23/2019    2:33 PM  PHQ 2/9 Scores  PHQ - 2 Score 0 0 0 0 0 0 0    Fall Risk    10/06/2021    3:10 PM 08/21/2021    9:50 AM 04/26/2021    1:37 PM 02/20/2021   10:05 AM 09/27/2020    3:54 PM  [place] in the past year? 0 0 0 0 0  Number falls in past yr: 0 0 0 0 0  Injury with Fall? 0 0 0 0 0  Risk for fall due to : No Fall Risks No Fall Risks No Fall Risks No Fall Risks No Fall Risks  Follow up Falls evaluation completed Falls evaluation completed Falls evaluation completed Falls evaluation completed Falls evaluation completed    FALL RISK PREVENTION PERTAINING TO THE HOME:  Any stairs in or around the home? No  If so, are there any without handrails?  na Home free of loose throw rugs in walkways, pet beds,  electrical cords, etc? Yes  Adequate lighting in your home to reduce risk of falls? Yes   ASSISTIVE DEVICES UTILIZED TO PREVENT FALLS:  Life alert? Yes  Use of a cane, walker or w/c? Yes  Grab bars in the bathroom? Yes  Shower chair or  bench in shower? Yes  Elevated toilet seat or a handicapped toilet? Yes   TIMED UP AND GO:  Was the test performed? No .   Cognitive Function:    09/16/2018    9:11 AM 09/11/2017    9:35 AM 08/22/2016    9:38 AM 08/19/2015    9:07 AM 08/13/2014    8:57 AM  MMSE - Mini Mental State Exam  Orientation to time 5 5 5 5 5   Orientation to Place 5 5 5 5 5   Registration 3 3 3 3 3   Attention/ Calculation 5 5 5 5 5   Recall 3 3 2 3 2   Language- name 2 objects 2 2 2 2 2   Language- repeat 1 1 1 1 1   Language- follow 3 step command 3 3 3 3 3   Language- read & follow direction 1 1 1 1 1   Write a sentence 1 1 1 1 1   Copy design 1 1 1 1 1   Total score 30 30 29 30 29         10/06/2021    3:11 PM 09/27/2020    3:56 PM 09/22/2019    8:53 AM  6CIT Screen  What Year? 0 points 0 points 0 points  What month? 0 points 0 points 0 points  What time? 0 points 0 points 0 points  Count back from 20 0 points 0 points 0 points  Months in reverse 0 points 0 points 0 points  Repeat phrase 0 points 0 points 4 points  Total Score 0 points 0 points 4 points    Immunizations Immunization History  Administered Date(s) Administered   Fluad Quad(high Dose 65+) 10/08/2018   Influenza Whole 10/16/2011   Influenza, High Dose Seasonal PF 10/01/2017, 10/07/2019   Influenza,inj,Quad PF,6+ Mos 09/21/2015   Influenza-Unspecified 10/22/2013, 10/01/2014, 09/26/2016, 10/28/2020   PFIZER(Purple Top)SARS-COV-2 Vaccination 02/22/2019, 03/19/2019, 01/04/2020, 06/22/2020   Pfizer Covid-19 Vaccine Bivalent Booster 3yr & up 10/28/2020   Pneumococcal Conjugate-13 08/13/2014   Pneumococcal Polysaccharide-23 08/19/2015   Tdap 08/27/2013   Zoster Recombinat (Shingrix) 05/29/2016, 08/23/2016   Zoster, Live 01/16/2011    TDAP status: Up to date  Flu Vaccine status: Up to date  Pneumococcal vaccine status: Up to date  Covid-19 vaccine status: Information provided on how to obtain vaccines.   Qualifies for Shingles Vaccine?  Yes   Zostavax completed Yes   Shingrix Completed?: Yes  Screening Tests Health Maintenance  Topic Date Due   COVID-19 Vaccine (6 - Pfizer series) 02/28/2021   INFLUENZA VACCINE  08/15/2021   DEXA SCAN  02/20/2022 (Originally 01/04/1995)   TETANUS/TDAP  08/28/2023   Pneumonia Vaccine 88 Years old  Completed   Zoster Vaccines- Shingrix  Completed   HPV VACCINES  Aged Out    Health Maintenance  Health Maintenance Due  Topic Date Due   COVID-19 Vaccine (6 - Pfizer series) 02/28/2021   INFLUENZA VACCINE  08/15/2021    Colorectal cancer screening: No longer required.   Mammogram status: No longer required due to age.  Declines   Lung Cancer Screening: (Low Dose CT Chest recommended if Age 86-80years, 30 pack-year currently smoking OR have quit w/in 15years.) does not qualify.   Lung Cancer Screening Referral:  na  Additional Screening:  Hepatitis C Screening: does not qualify;na   Vision Screening: Recommended annual ophthalmology exams for early detection of glaucoma and other disorders of the eye. Is the patient up to date with their annual eye exam?  Yes  Who is the provider or what is the name of the office in which the patient attends annual eye exams? [facility] If pt is not established with a provider, would they like to be referred to a provider to establish care? No .   Dental Screening: Recommended annual dental exams for proper oral hygiene  Community Resource Referral / Chronic Care Management: CRR required this visit?  No   CCM required this visit?  No      Plan:     I have personally reviewed and noted the following in the patient's chart:   Medical and social history Use of alcohol, tobacco or illicit drugs  Current medications and supplements including opioid prescriptions. Patient is not currently taking opioid prescriptions. Functional ability and status Nutritional status Physical activity Advanced directives List of other  physicians Hospitalizations, surgeries, and ER visits in previous 12 months Vitals Screenings to include cognitive, depression, and falls Referrals and appointments  In addition, I have reviewed and discussed with patient certain preventive protocols, quality metrics, and best practice recommendations. A written personalized care plan for preventive services as well as general preventive health recommendations were provided to patient.     [name], NP   10/06/2021   Virtual Visit via Telephone Note  I connected with patient 10/06/21 at  3:20 PM EDT by telephone and verified that I am speaking with the correct person using two identifiers.  Location: Patient: home Provider: [facility]   I discussed the limitations, risks, security and privacy concerns of performing an evaluation and management service by telephone and the availability of in person appointments. I also discussed with the patient that there may be a patient responsible charge related to this service. The patient expressed understanding and agreed to proceed.   I discussed the assessment and treatment plan with the patient. The patient was provided an opportunity to ask questions and all were answered. The patient agreed with the plan and demonstrated an understanding of the instructions.   The patient was advised to call back or seek an in-person evaluation if the symptoms worsen or if the condition fails to improve as anticipated.  I provided 15 minutes of non-face-to-face time during this encounter.  Carlos American. Harle Battiest Avs printed and mailed

## 2021-10-06 NOTE — Patient Instructions (Signed)
Donna [name] , Thank you for taking time to come for your Medicare Wellness Visit. I appreciate your ongoing commitment to your health goals. Please review the following plan we discussed and let me know if I can assist you in the future.   Screening recommendations/referrals: Colonoscopy aged out Mammogram aged out Bone Density declined  Recommended yearly ophthalmology/optometry visit for glaucoma screening and checkup Recommended yearly dental visit for hygiene and checkup  Vaccinations: Influenza vaccine- due annually in September/October Pneumococcal vaccine up to date Tdap vaccine up to date Shingles vaccine up to date    Advanced directives: on file.   Conditions/risks identified: advance age  Next appointment: yearly- in person    Preventive Care 10 Years and Older, Female Preventive care refers to lifestyle choices and visits with your health care provider that can promote health and wellness. What does preventive care include? A yearly physical exam. This is also called an annual well check. Dental exams once or twice a year. Routine eye exams. Ask your health care provider how often you should have your eyes checked. Personal lifestyle choices, including: Daily care of your teeth and gums. Regular physical activity. Eating a healthy diet. Avoiding tobacco and drug use. Limiting alcohol use. Practicing safe sex. Taking low-dose aspirin every day. Taking vitamin and mineral supplements as recommended by your health care provider. What happens during an annual well check? The services and screenings done by your health care provider during your annual well check will depend on your age, overall health, lifestyle risk factors, and family history of disease. Counseling  Your health care provider may ask you questions about your: Alcohol use. Tobacco use. Drug use. Emotional well-being. Home and relationship well-being. Sexual activity. Eating habits. History of  falls. Memory and ability to understand (cognition). Work and work Statistician. Reproductive health. Screening  You may have the following tests or measurements: Height, weight, and BMI. Blood pressure. Lipid and cholesterol levels. These may be checked every 5 years, or more frequently if you are over 36 years old. Skin check. Lung cancer screening. You may have this screening every year starting at age 89 if you have a 30-pack-year history of smoking and currently smoke or have quit within the past 15 years. Fecal occult blood test (FOBT) of the stool. You may have this test every year starting at age 56. Flexible sigmoidoscopy or colonoscopy. You may have a sigmoidoscopy every 5 years or a colonoscopy every 10 years starting at age 72. Hepatitis C blood test. Hepatitis B blood test. Sexually transmitted disease (STD) testing. Diabetes screening. This is done by checking your blood sugar (glucose) after you have not eaten for a while (fasting). You may have this done every 1-3 years. Bone density scan. This is done to screen for osteoporosis. You may have this done starting at age 40. Mammogram. This may be done every 1-2 years. Talk to your health care provider about how often you should have regular mammograms. Talk with your health care provider about your test results, treatment options, and if necessary, the need for more tests. Vaccines  Your health care provider may recommend certain vaccines, such as: Influenza vaccine. This is recommended every year. Tetanus, diphtheria, and acellular pertussis (Tdap, Td) vaccine. You may need a Td booster every 10 years. Zoster vaccine. You may need this after age 22. Pneumococcal 13-valent conjugate (PCV13) vaccine. One dose is recommended after age 107. Pneumococcal polysaccharide (PPSV23) vaccine. One dose is recommended after age 75. Talk to your health care  provider about which screenings and vaccines you need and how often you need  them. This information is not intended to replace advice given to you by your health care provider. Make sure you discuss any questions you have with your health care provider. Document Released: 01/28/2015 Document Revised: 09/21/2015 Document Reviewed: 11/02/2014 Elsevier Interactive Patient Education  2017 [employer] Prevention in the Home Falls can cause injuries. They can happen to people of all ages. There are many things you can do to make your home safe and to help prevent falls. What can I do on the outside of my home? Regularly fix the edges of walkways and driveways and fix any cracks. Remove anything that might make you trip as you walk through a door, such as a raised step or threshold. Trim any bushes or trees on the path to your home. Use bright outdoor lighting. Clear any walking paths of anything that might make someone trip, such as rocks or tools. Regularly check to see if handrails are loose or broken. Make sure that both sides of any steps have handrails. Any raised decks and porches should have guardrails on the edges. Have any leaves, snow, or ice cleared regularly. Use sand or salt on walking paths during winter. Clean up any spills in your garage right away. This includes oil or grease spills. What can I do in the bathroom? Use night lights. Install grab bars by the toilet and in the tub and shower. Do not use towel bars as grab bars. Use non-skid mats or decals in the tub or shower. If you need to sit down in the shower, use a plastic, non-slip stool. Keep the floor dry. Clean up any water that spills on the floor as soon as it happens. Remove soap buildup in the tub or shower regularly. Attach bath mats securely with double-sided non-slip rug tape. Do not have throw rugs and other things on the floor that can make you trip. What can I do in the bedroom? Use night lights. Make sure that you have a light by your bed that is easy to reach. Do not use  any sheets or blankets that are too big for your bed. They should not hang down onto the floor. Have a firm chair that has side arms. You can use this for support while you get dressed. Do not have throw rugs and other things on the floor that can make you trip. What can I do in the kitchen? Clean up any spills right away. Avoid walking on wet floors. Keep items that you use a lot in easy-to-reach places. If you need to reach something above you, use a strong step stool that has a grab bar. Keep electrical cords out of the way. Do not use floor polish or wax that makes floors slippery. If you must use wax, use non-skid floor wax. Do not have throw rugs and other things on the floor that can make you trip. What can I do with my stairs? Do not leave any items on the stairs. Make sure that there are handrails on both sides of the stairs and use them. Fix handrails that are broken or loose. Make sure that handrails are as long as the stairways. Check any carpeting to make sure that it is firmly attached to the stairs. Fix any carpet that is loose or worn. Avoid having throw rugs at the top or bottom of the stairs. If you do have throw rugs, attach them to the  floor with carpet tape. Make sure that you have a light switch at the top of the stairs and the bottom of the stairs. If you do not have them, ask someone to add them for you. What else can I do to help prevent falls? Wear shoes that: Do not have high heels. Have rubber bottoms. Are comfortable and fit you well. Are closed at the toe. Do not wear sandals. If you use a stepladder: Make sure that it is fully opened. Do not climb a closed stepladder. Make sure that both sides of the stepladder are locked into place. Ask someone to hold it for you, if possible. Clearly mark and make sure that you can see: Any grab bars or handrails. First and last steps. Where the edge of each step is. Use tools that help you move around (mobility aids)  if they are needed. These include: Canes. Walkers. Scooters. Crutches. Turn on the lights when you go into a dark area. Replace any light bulbs as soon as they burn out. Set up your furniture so you have a clear path. Avoid moving your furniture around. If any of your floors are uneven, fix them. If there are any pets around you, be aware of where they are. Review your medicines with your doctor. Some medicines can make you feel dizzy. This can increase your chance of falling. Ask your doctor what other things that you can do to help prevent falls. This information is not intended to replace advice given to you by your health care provider. Make sure you discuss any questions you have with your health care provider. Document Released: 10/28/2008 Document Revised: 06/09/2015 Document Reviewed: 02/05/2014 Elsevier Interactive Patient Education  2017 [employer].

## 2021-10-06 NOTE — Progress Notes (Signed)
  This service is provided via telemedicine  No vital signs collected/recorded due to the encounter was a telemedicine visit.   Location of patient (ex: home, work):  Home  Patient consents to a telephone visit:  Yes  Location of the provider (ex: office, home):  [facility].  Name of any referring provider:  Eubanks, Jessica K, NP   Names of all persons participating in the telemedicine service and their role in the encounter:  Patient, Donna [name], RMA, Jessica Eubanks, NP.    Time spent on call: 8 minutes spent on the phone with Medical Assistant.   

## 2021-10-09 DIAGNOSIS — M7061 Trochanteric bursitis, right hip: Secondary | ICD-10-CM | POA: Diagnosis not present

## 2021-10-09 DIAGNOSIS — M25551 Pain in right hip: Secondary | ICD-10-CM | POA: Diagnosis not present

## 2021-10-09 DIAGNOSIS — M4316 Spondylolisthesis, lumbar region: Secondary | ICD-10-CM | POA: Diagnosis not present

## 2021-11-08 DIAGNOSIS — H5213 Myopia, bilateral: Secondary | ICD-10-CM | POA: Diagnosis not present

## 2021-11-08 DIAGNOSIS — H02831 Dermatochalasis of right upper eyelid: Secondary | ICD-10-CM | POA: Diagnosis not present

## 2021-11-08 DIAGNOSIS — H04123 Dry eye syndrome of bilateral lacrimal glands: Secondary | ICD-10-CM | POA: Diagnosis not present

## 2021-11-08 DIAGNOSIS — H353134 Nonexudative age-related macular degeneration, bilateral, advanced atrophic with subfoveal involvement: Secondary | ICD-10-CM | POA: Diagnosis not present

## 2021-12-08 ENCOUNTER — Other Ambulatory Visit: Payer: Self-pay | Admitting: Cardiology

## 2021-12-08 DIAGNOSIS — I10 Essential (primary) hypertension: Secondary | ICD-10-CM

## 2021-12-15 ENCOUNTER — Encounter: Payer: Self-pay | Admitting: Cardiology

## 2021-12-15 ENCOUNTER — Ambulatory Visit: Payer: Medicare PPO | Admitting: Cardiology

## 2021-12-15 VITALS — BP 142/67 | HR 69 | Resp 15 | Ht 63.0 in | Wt 149.8 lb

## 2021-12-15 DIAGNOSIS — I350 Nonrheumatic aortic (valve) stenosis: Secondary | ICD-10-CM | POA: Diagnosis not present

## 2021-12-15 DIAGNOSIS — I10 Essential (primary) hypertension: Secondary | ICD-10-CM

## 2021-12-15 DIAGNOSIS — I351 Nonrheumatic aortic (valve) insufficiency: Secondary | ICD-10-CM

## 2021-12-15 DIAGNOSIS — R0609 Other forms of dyspnea: Secondary | ICD-10-CM

## 2021-12-15 DIAGNOSIS — Z87891 Personal history of nicotine dependence: Secondary | ICD-10-CM

## 2021-12-15 DIAGNOSIS — I471 Supraventricular tachycardia, unspecified: Secondary | ICD-10-CM

## 2021-12-15 NOTE — Progress Notes (Signed)
ID:  Donna [name], DOB 1928-01-10, MRN [id number]  PCP:  [name], NP  Cardiologist:  Rex Kras, DO, [facility] (established care 08/26/2019)  Date: 12/15/21 Last Office Visit: 06/15/2021  Chief Complaint  Patient presents with   Follow-up    6 months History of PSVT and mixed aortic valve disease    HPI  Donna [name] is a 88 y.o. 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  Was referred to the practice for evaluation of palpitations.  Workup illustrated PSVT.  Referred to EP for further evaluation and management.  She underwent EP study and subsequently radiofrequency ablation of the slow pathway and since then has remained symptomatically better.  During the cardiovascular workup she was noted to have mixed aortic valve disease with both stenosis and regurgitation.  However the severity of aortic stenosis was not significant based on invasive hemodynamics as outlined below.  Over the last 6 months patient has not been hospitalized or seen in urgent care for cardiovascular symptoms.  Her palpitations are no longer present.  Her shortness of breath with effort related activities has significantly improved.  She keeps a log of her blood pressures at home.  On visual estimation her systolic blood pressures are averaging 140 mmHg.  She denies anginal discomfort, syncope, heart failure symptoms.  She is due for repeat echocardiogram in May 2024 to reevaluate the progression of aortic valve disease.  ALLERGIES: No Known Allergies  MEDICATION LIST PRIOR TO VISIT: Current Meds  Medication Sig   Blood Pressure Monitoring KIT Essential hypertension check bp daily   Calcium Carbonate-Vitamin D 600-400 MG-UNIT per tablet Take 1 tablet by mouth in the morning and at bedtime.    hydrALAZINE (APRESOLINE) 25 MG tablet TAKE 1 TABLET(25 MG) BY MOUTH THREE TIMES DAILY   levothyroxine (SYNTHROID) 100 MCG tablet TAKE 1 TABLET BY MOUTH EVERY DAY 30 MINUTES BEFORE BREAKFAST ON AN EMPTY STOMACH   Light Mineral Oil-Mineral Oil (RETAINE MGD) 0.5-0.5 % EMUL Place 1 drop into both eyes in the morning and at bedtime.   losartan (COZAAR) 100 MG tablet TAKE 1 TABLET(100 MG) BY MOUTH EVERY MORNING   meloxicam (MOBIC) 7.5 MG tablet TAKE 1 TABLET(7.5 MG) BY MOUTH DAILY   metoprolol succinate (TOPROL-XL) 25 MG 24 hr tablet TAKE 1 TABLET BY MOUTH EVERY MORNING, HOLD IF SYSTOLIC BLOOD PRESSURE LESS THAN 100 OR PULSE LESS THAN 55   Multiple Vitamins-Minerals (PRESERVISION AREDS 2) CAPS Take 1 capsule by mouth 2 (two) times a day.    OVER THE COUNTER MEDICATION Take 2 capsules by mouth daily. MitoQ   vitamin B-12 (CYANOCOBALAMIN) 1000 MCG tablet Take 1 tablet (1,000 mcg total) by mouth daily.     PAST MEDICAL HISTORY: Past Medical History:  Diagnosis Date   Aortic stenosis    Hemorrhage of gastrointestinal tract, unspecified    Hypertension    Migraine    Mitral regurgitation    OA (osteoarthritis)    Thyroid cyst     PAST SURGICAL HISTORY: Past Surgical History:  Procedure Laterality Date   CATARACT EXTRACTION  1990   rt   CATARACT EXTRACTION  2005   left   LEFT HEART CATH AND CORONARY ANGIOGRAPHY N/A 10/06/2019   Procedure: LEFT HEART CATH AND CORONARY ANGIOGRAPHY;  Surgeon: Nigel Mormon, MD;  Location: [place] [facility];  Service: Cardiovascular;  Laterality: N/A;   REVISION TOTAL KNEE ARTHROPLASTY  2008   SVT ABLATION N/A 01/18/2020   Procedure: SVT ABLATION;  Surgeon: Evans Lance, MD;  Location: [place] [facility];  Service: Cardiovascular;  Laterality: N/A;   THUMB ARTHROSCOPY     TONSILLECTOMY      FAMILY HISTORY: The patient family history includes Arthritis in her sister; Brain cancer in her brother; CVA (age of onset: 26) in her sister; CVA (age of onset:  64) in her father; Heart disease in her sister; Pancreatic cancer in her mother.  SOCIAL HISTORY:  The patient  reports that she quit smoking about 73 years ago. Her smoking use included cigarettes. She has never used smokeless tobacco. She reports that she does not currently use alcohol. She reports that she does not use drugs.  REVIEW OF SYSTEMS: Review of Systems  Constitutional: Negative for chills and fever.  HENT:  Negative for hoarse voice and nosebleeds.   Eyes:  Negative for discharge, double vision and pain.  Cardiovascular:  Positive for dyspnea on exertion (improved). Negative for chest pain, claudication, leg swelling, near-syncope, orthopnea, palpitations, paroxysmal nocturnal dyspnea and syncope.  Respiratory:  Negative for hemoptysis and shortness of breath.   Musculoskeletal:  Negative for muscle cramps and myalgias.  Gastrointestinal:  Negative for abdominal pain, constipation, diarrhea, hematemesis, hematochezia, melena, nausea and vomiting.  Neurological:  Negative for dizziness, light-headedness and vertigo.    PHYSICAL EXAM:    12/15/2021   11:26 AM 08/21/2021   10:08 AM 06/15/2021   10:25 AM  Vitals with BMI  Height _0  _1    Weight 149 lbs 13 oz 154 lbs   BMI 62.83 15.17   Systolic 616 073 710  Diastolic 67 80 64  Pulse 69 65 66   CONSTITUTIONAL: Well-developed and well-nourished. No acute distress.  SKIN: Skin is warm and dry. No rash noted. No cyanosis. No pallor. No jaundice HEAD: Normocephalic and atraumatic.  EYES: No scleral icterus MOUTH/THROAT: Moist oral membranes.  NECK: No JVD present. No thyromegaly noted.  Bilateral carotid bruits most likely secondary to delayed carotid upstrokes CHEST Normal respiratory effort. No intercostal retractions  LUNGS: Clear to auscultation bilaterally.  No stridor. No wheezes. No rales.  CARDIOVASCULAR: Regular, positive S1 delayed S2, 3 out of 6 crescendo decrescendo murmur heard at the second right intercostal  space, with a component of holosystolic murmur at the apex. ABDOMINAL: Soft, nontender, nondistended, no apparent ascites.  EXTREMITIES: Trace bilateral peripheral edema.  HEMATOLOGIC: No significant bruising NEUROLOGIC: Oriented to person, place, and time. Nonfocal. Normal muscle tone.  PSYCHIATRIC: Normal mood and affect. Normal behavior. Cooperative  CARDIAC DATABASE: EKG: 06/15/2021: NSR, 64 bpm, poor R wave progression, consider old anteroseptal infarct. 12/15/21: Sinus Rhythm, 64bpm, consider possible old anterior infarct, nonspecific ST depression.   Echocardiogram: 03/24/2020: LVEF 60 to 65%, no regional wall motion abnormalities, mild LVH, normal diastolic function, normal right ventricular size and function, mild MR, no MS, mild aortic regurgitation, moderate aortic stenosis (mean gradient 25 mmHg, peak velocity 3.2 m/s), estimated RAP 3 mmHg.  Jun 07, 2021: LVEF 60 to 65%, moderate LVH, grade 2 diastolic dysfunction, elevated LVEDP, normal PASP, moderately dilated left atrium, mild to moderate MR, no mitral stenosis, moderate MAC, moderate aortic regurgitation, mild aortic stenosis (peak velocity 3.09 m/s, mean gradient 23 mmHg, AVA per VTI 1.33 cm, dimensional index 0.42).   Personally reviewed the echo, LVEF is preserved, grade 2 diastolic dysfunction, elevated left ventricular filling pressures/LAP, mild/moderate aortic stenosis and  mild regurgitation.  Estimated RAP 3 mmHg.  Stress Testing: None  Heart Catheterization: 10/06/2019: LM: Normal LAD: Mild luminal irregularities LCx: Normal RCA: Normal   LVEDP 14 mmHg 60 mmHg mid-cavitary gradient on pullback No significant LV-Ao gradient on pullback  Carotid artery duplex  09/09/2019:  Minimal stenosis in the right internal carotid artery (minimal).  Minimal stenosis in the left internal carotid artery (minimal).  Minimal plaque noted in the CCA bilaterally without significant stenosis.  Antegrade right vertebral artery  flow. Antegrade left vertebral artery flow.  Follow up studies if clinically indicated.  7 day extended Holter monitor: Predominantly normal sinus rhythm (NSR).  HR 40-210 bpm.  Avg HR 79 bpm. Minimum HR 40 bpm on 08/29/19 at 1:02pm sinus with junctional escape. No atrial fibrillation/NSVT/pause (3 secs or longer). Total ventricular ectopic burden <1%. Total supraventricular ectopic burden <1%. 42 episodes of SVT.  Fastest and longest run was 1hr 17 mins with max HR 210 bpm (avg 155 bpm). Patient triggered events: 6.  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   LABORATORY DATA:    Latest Ref Rng & Units 08/21/2021   10:39 AM 02/20/2021   10:38 AM 04/22/2020    2:11 PM  CBC  WBC 3.8 - 10.8 Thousand/uL 4.7  5.4  6.2   Hemoglobin 11.7 - 15.5 g/dL 13.1  13.1  13.6   Hematocrit 35.0 - 45.0 % 38.2  38.4  42.3   Platelets 140 - 400 Thousand/uL 148  172  195        Latest Ref Rng & Units 08/21/2021   10:39 AM 02/28/2021   10:26 AM 02/20/2021   10:38 AM  CMP  Glucose 65 - 99 mg/dL 86  79  92   BUN 7 - 25 mg/dL _0 Creatinine 0.60 - 0.95 mg/dL 0.79  0.81  0.84   Sodium 135 - 146 mmol/L 132  130  130   Potassium 3.5 - 5.3 mmol/L 5.0  4.8  5.2   Chloride 98 - 110 mmol/L 98  97  97   CO2 20 - 32 mmol/L _1 Calcium 8.6 - 10.4 mg/dL 9.9  9.8  10.3   Total Protein 6.1 - 8.1 g/dL 5.9   5.7   Total Bilirubin 0.2 - 1.2 mg/dL 0.6   0.6   AST 10 - 35 U/L 15   13   ALT 6 - 29 U/L 17   15     Lipid Panel  Lab Results  Component Value Date   CHOL 112 12/08/2018   HDL 34 (L) 12/08/2018   LDLCALC 62 12/08/2018   TRIG 80 12/08/2018   CHOLHDL 3.3 12/08/2018    No components found for: "NTPROBNP" No results for input(s): "PROBNP" in the last 8760 hours. Recent Labs    02/20/21 1038  TSH 2.79    BMP Recent Labs    02/20/21 1038 02/28/21 1026 08/21/21 1039  NA 130* 130* 132*  K 5.2 4.8 5.0  CL 97* 97* 98  CO2  _2 GLUCOSE 92 79 86  BUN _3 CREATININE 0.84 0.81 0.79  CALCIUM 10.3 9.8 9.9    HEMOGLOBIN A1C Lab Results  Component Value Date   HGBA1C 5.1 08/22/2016   MPG 100 08/22/2016    IMPRESSION:    ICD-10-CM   1. Nonrheumatic aortic valve stenosis  I35.0  2. Nonrheumatic aortic valve insufficiency  I35.1     3. Essential hypertension, benign  I10     4. Paroxysmal SVT (supraventricular tachycardia)  I47.10 EKG 12-Lead    5. Dyspnea on exertion  R06.09     6. Former smoker  Z87.891       RECOMMENDATIONS: Donna [name] is a 88 y.o. female whose past medical history and cardiac risk factors include: hypertension, mild OSA, supraventricular tachycardia, mixed aortic valve disease, mild MR, migraine, GI bleeding, postmenopausal female, advanced age.  Nonrheumatic aortic valve stenosis / aortic valve insufficiency Asymptomatic.  Most recent echocardiogram from May 2023 independently reviewed. Patient has mild to moderate aortic stenosis per hemodynamics and mild aortic regurgitation. Denies angina pectoris, heart failure symptoms, near-syncope/syncope. Repeat echo in May 2024 Monitor for now.  Essential hypertension, benign Office blood pressures acceptable.  Home BP log reviewed on visually estimation average SBP around 164mHG.  Continue Hydralazine and Losartan  Of note amlodipine was discontinued in the past due to lower extremity swelling Reemphasized importance of a low-salt diet.  Paroxysmal SVT (supraventricular tachycardia) (HCC) Underwent EP study and radiofrequency ablation of her slow pathway by Dr. TLovena Le Has remained stable on Toprol-XL 25 mg p.o. daily since ablation. We will continue to monitor her peripherally.   She continues to follow with Dr. TLovena Le Dyspnea on exertion Stable. Multifactorial: Valvular heart disease, seasonal allergies, hypertension. Valvular heart disease remains relatively stable - follow up study in May 2024.   Blood pressure within acceptable limits, no changes warranted.   FINAL MEDICATION LIST END OF ENCOUNTER: No orders of the defined types were placed in this encounter.     Medications Discontinued During This Encounter  Medication Reason   Magnesium Glycinate 100 MG CAPS     Current Outpatient Medications:    Blood Pressure Monitoring KIT, Essential hypertension check bp daily, Disp: 1 kit, Rfl: 0   Calcium Carbonate-Vitamin D 600-400 MG-UNIT per tablet, Take 1 tablet by mouth in the morning and at bedtime., Disp: , Rfl:    hydrALAZINE (APRESOLINE) 25 MG tablet, TAKE 1 TABLET(25 MG) BY MOUTH THREE TIMES DAILY, Disp: 270 tablet, Rfl: 0   levothyroxine (SYNTHROID) 100 MCG tablet, TAKE 1 TABLET BY MOUTH EVERY DAY 30 MINUTES BEFORE BREAKFAST ON AN EMPTY STOMACH, Disp: 90 tablet, Rfl: 3   Light Mineral Oil-Mineral Oil (RETAINE MGD) 0.5-0.5 % EMUL, Place 1 drop into both eyes in the morning and at bedtime., Disp: , Rfl:    losartan (COZAAR) 100 MG tablet, TAKE 1 TABLET(100 MG) BY MOUTH EVERY MORNING, Disp: 90 tablet, Rfl: 1   meloxicam (MOBIC) 7.5 MG tablet, TAKE 1 TABLET(7.5 MG) BY MOUTH DAILY, Disp: 90 tablet, Rfl: 1   metoprolol succinate (TOPROL-XL) 25 MG 24 hr tablet, TAKE 1 TABLET BY MOUTH EVERY MORNING, HOLD IF SYSTOLIC BLOOD PRESSURE LESS THAN 100 OR PULSE LESS THAN 55, Disp: 90 tablet, Rfl: 1   Multiple Vitamins-Minerals (PRESERVISION AREDS 2) CAPS, Take 1 capsule by mouth 2 (two) times a day. , Disp: , Rfl:    OVER THE COUNTER MEDICATION, Take 2 capsules by mouth daily. MitoQ, Disp: , Rfl:    vitamin B-12 (CYANOCOBALAMIN) 1000 MCG tablet, Take 1 tablet (1,000 mcg total) by mouth daily., Disp: 30 tablet, Rfl: 5  Orders Placed This Encounter  Procedures   EKG 12-Lead   There are no Patient Instructions on file for this visit.   --Continue cardiac medications as reconciled in final medication list. --Return in about 6 months (around  06/16/2022) for Follow up aortic disease , Echo. Or  sooner if needed. --Continue follow-up with your primary care physician regarding the management of your other chronic comorbid conditions.  Patient's questions and concerns were addressed to her satisfaction. She voices understanding of the instructions provided during this encounter.   This note was created using a voice recognition software as a result there may be grammatical errors inadvertently enclosed that do not reflect the nature of this encounter. Every attempt is made to correct such errors.  Rex Kras, [place], [facility]  Pager: [phone] Office: [phone]

## 2021-12-18 ENCOUNTER — Other Ambulatory Visit: Payer: Self-pay | Admitting: Cardiology

## 2021-12-18 DIAGNOSIS — I350 Nonrheumatic aortic (valve) stenosis: Secondary | ICD-10-CM

## 2021-12-18 DIAGNOSIS — I10 Essential (primary) hypertension: Secondary | ICD-10-CM

## 2022-01-16 DIAGNOSIS — M533 Sacrococcygeal disorders, not elsewhere classified: Secondary | ICD-10-CM | POA: Diagnosis not present

## 2022-01-16 DIAGNOSIS — M7061 Trochanteric bursitis, right hip: Secondary | ICD-10-CM | POA: Diagnosis not present

## 2022-02-01 ENCOUNTER — Other Ambulatory Visit: Payer: Self-pay | Admitting: Cardiology

## 2022-02-01 DIAGNOSIS — I471 Supraventricular tachycardia, unspecified: Secondary | ICD-10-CM

## 2022-02-01 DIAGNOSIS — I350 Nonrheumatic aortic (valve) stenosis: Secondary | ICD-10-CM

## 2022-03-05 ENCOUNTER — Ambulatory Visit: Payer: Medicare PPO | Admitting: Nurse Practitioner

## 2022-03-05 ENCOUNTER — Encounter: Payer: Self-pay | Admitting: Nurse Practitioner

## 2022-03-05 ENCOUNTER — Other Ambulatory Visit: Payer: Self-pay | Admitting: Nurse Practitioner

## 2022-03-05 VITALS — BP 160/80 | HR 86 | Ht 63.0 in | Wt 154.0 lb

## 2022-03-05 DIAGNOSIS — G8929 Other chronic pain: Secondary | ICD-10-CM | POA: Diagnosis not present

## 2022-03-05 DIAGNOSIS — I10 Essential (primary) hypertension: Secondary | ICD-10-CM

## 2022-03-05 DIAGNOSIS — E871 Hypo-osmolality and hyponatremia: Secondary | ICD-10-CM | POA: Diagnosis not present

## 2022-03-05 DIAGNOSIS — M545 Low back pain, unspecified: Secondary | ICD-10-CM | POA: Diagnosis not present

## 2022-03-05 DIAGNOSIS — K5904 Chronic idiopathic constipation: Secondary | ICD-10-CM

## 2022-03-05 DIAGNOSIS — E039 Hypothyroidism, unspecified: Secondary | ICD-10-CM

## 2022-03-05 DIAGNOSIS — I080 Rheumatic disorders of both mitral and aortic valves: Secondary | ICD-10-CM | POA: Diagnosis not present

## 2022-03-05 MED ORDER — DICLOFENAC SODIUM 1 % EX GEL: 4.0000 g | Freq: Four times a day (QID) | CUTANEOUS | 3 refills | Status: DC | PRN

## 2022-03-05 MED ORDER — HYDRALAZINE HCL 50 MG PO TABS: 50.0000 mg | ORAL_TABLET | Freq: Three times a day (TID) | ORAL | 1 refills | Status: DC

## 2022-03-05 NOTE — Progress Notes (Unsigned)
Careteam: Patient Care Team: [name], NP as PCP - General (Geriatric Medicine) [name], OD as Consulting Physician (Optometry) Caffie Pinto., MD as Referring Physician (Orthopedic Surgery) Rex Kras, DO as Referring Physician (Cardiology)  PLACE OF SERVICE:  [place] Directive information    No Known Allergies  Chief Complaint  Patient presents with   Medical Management of Chronic Issues    Patient presents today for a 6 month follow-up   Quality Metric Gaps    DEXA scan, COVID#6     HPI: Patient is a 89 y.o. [name] here for routine follow-up.   Checks her BP every morning and every night for her cardiologist. BP has been a little higher 140s-150s but sometimes into the 160s. She has already taken her BP medication this morning.   Denies feeling lightheaded or dizzy, any numbness or tingling, headaches, denies chest pain. Gets short of breath with activity sometimes but she feels its better than it used to be and is not new. Denies swelling in her hands, sometimes her ankles get swollen if she stands for a long time or sits in a car for a while. Improves with elevation of BLE. No palpitations.   Denies blood in urine or stool. Denies diarrhea but has been experiencing some constipation. Hasn't tried anything for it because she wasn't sure what to take. She drinks about 3 glasses of water a day.  She isn't sleeping well because of hip pain in both hips. She has had PT in the past to assist with balance. She has been taking meloxicam at night but it hasn't been helping much.   Review of Systems:  Review of Systems  Constitutional:  Negative for chills, fever, malaise/fatigue and weight loss.  HENT:  Negative for congestion and sore throat.   Eyes:  Negative for blurred vision.  Respiratory:  Negative for cough, shortness of breath and wheezing.   Cardiovascular:  Negative for chest pain, palpitations and leg swelling.  Gastrointestinal:   Negative for abdominal pain, blood in stool, constipation, diarrhea, heartburn, nausea and vomiting.  Genitourinary:  Negative for dysuria, frequency, hematuria and urgency.  Musculoskeletal:  Positive for joint pain. Negative for falls.       Lower back, BIL hips, chronic but mostly R hip  Skin:  Negative for rash.  Neurological:  Negative for dizziness, tingling and headaches.  Endo/Heme/Allergies:  Negative for polydipsia.  Psychiatric/Behavioral:  Negative for depression. The patient is not nervous/anxious.   ***  Past Medical History:  Diagnosis Date   Aortic stenosis    Hemorrhage of gastrointestinal tract, unspecified    Hypertension    Migraine    Mitral regurgitation    OA (osteoarthritis)    Thyroid cyst    Past Surgical History:  Procedure Laterality Date   CATARACT EXTRACTION  1990   rt   CATARACT EXTRACTION  2005   left   LEFT HEART CATH AND CORONARY ANGIOGRAPHY N/A 10/06/2019   Procedure: LEFT HEART CATH AND CORONARY ANGIOGRAPHY;  Surgeon: Nigel Mormon, MD;  Location: [facility];  Service: Cardiovascular;  Laterality: N/A;   REVISION TOTAL KNEE ARTHROPLASTY  2008   SVT ABLATION N/A 01/18/2020   Procedure: SVT ABLATION;  Surgeon: Evans Lance, MD;  Location: [facility];  Service: Cardiovascular;  Laterality: N/A;   THUMB ARTHROSCOPY     TONSILLECTOMY     Social History:   reports that she quit smoking about 74 years ago. Her  smoking use included cigarettes. She has never used smokeless tobacco. She reports that she does not currently use alcohol. She reports that she does not use drugs.  Family History  Problem Relation Age of Onset   CVA Father 73   Pancreatic cancer Mother    CVA Sister 40   Brain cancer Brother    Arthritis Sister    Heart disease Sister    Breast cancer Neg Hx     Medications: Patient's Medications  New Prescriptions   DICLOFENAC SODIUM (VOLTAREN) 1 % GEL    Apply 4 g topically 4 (four) times daily as needed.    HYDRALAZINE (APRESOLINE) 50 MG TABLET    Take 1 tablet (50 mg total) by mouth 3 (three) times daily.  Previous Medications   BLOOD PRESSURE MONITORING KIT    Essential hypertension check bp daily   CALCIUM CARBONATE-VITAMIN D 600-400 MG-UNIT PER TABLET    Take 1 tablet by mouth in the morning and at bedtime.   LEVOTHYROXINE (SYNTHROID) 100 MCG TABLET    TAKE 1 TABLET BY MOUTH EVERY DAY 30 MINUTES BEFORE BREAKFAST ON AN EMPTY STOMACH   LIGHT MINERAL OIL-MINERAL OIL (RETAINE MGD) 0.5-0.5 % EMUL    Place 1 drop into both eyes in the morning and at bedtime.   LOSARTAN (COZAAR) 100 MG TABLET    TAKE 1 TABLET(100 MG) BY MOUTH EVERY MORNING   METOPROLOL SUCCINATE (TOPROL-XL) 25 MG 24 HR TABLET    TAKE 1 TABLET BY MOUTH EVERY MORNING, HOLD IF SYSTOLIC BLOOD PRESSURE LESS THAN 100 OR PULSE LESS THAN 55   MULTIPLE VITAMINS-MINERALS (PRESERVISION AREDS 2) CAPS    Take 1 capsule by mouth 2 (two) times a day.    OVER THE COUNTER MEDICATION    Take 2 capsules by mouth daily. MitoQ   VITAMIN B-12 (CYANOCOBALAMIN) 1000 MCG TABLET    Take 1 tablet (1,000 mcg total) by mouth daily.  Modified Medications   No medications on file  Discontinued Medications   HYDRALAZINE (APRESOLINE) 25 MG TABLET    TAKE 1 TABLET(25 MG) BY MOUTH THREE TIMES DAILY   MELOXICAM (MOBIC) 7.5 MG TABLET    TAKE 1 TABLET(7.5 MG) BY MOUTH DAILY    Physical Exam:  Vitals:   03/05/22 1001  BP: (!) 154/Donna  Pulse: 86  SpO2: 97%  Weight: 154 lb (69.9 kg)  Height: 5' 3"$  (1.6 m)   Body mass index is 27.28 kg/m. Wt Readings from Last 3 Encounters:  03/05/22 154 lb (69.9 kg)  12/15/21 149 lb 12.8 oz (67.9 kg)  08/21/21 154 lb (69.9 kg)    Physical Exam Vitals reviewed.  Constitutional:      General: She is not in acute distress.    Appearance: Normal appearance.  Cardiovascular:     Rate and Rhythm: Normal rate and regular rhythm.  Pulmonary:     Effort: No respiratory distress.     Breath sounds: Normal breath sounds.   Abdominal:     General: Bowel sounds are normal. There is no distension.     Palpations: Abdomen is soft. There is no mass.     Tenderness: There is no abdominal tenderness. There is no guarding.  Musculoskeletal:     Cervical back: Neck supple.  Lymphadenopathy:     Cervical: No cervical adenopathy.  Skin:    General: Skin is warm and dry.  Neurological:     Mental Status: She is alert and oriented to person, place, and time.  Psychiatric:  Mood and Affect: Mood normal.   ***  Labs reviewed: Basic Metabolic Panel: Recent Labs    08/21/21 1039  NA 132*  K 5.0  CL 98  CO2 28  GLUCOSE 86  BUN 18  CREATININE 0.79  CALCIUM 9.9   Liver Function Tests: Recent Labs    08/21/21 1039  AST 15  ALT 17  BILITOT 0.6  PROT 5.9*   No results for input(s): "LIPASE", "AMYLASE" in the last 8760 hours. No results for input(s): "AMMONIA" in the last 8760 hours. CBC: Recent Labs    08/21/21 1039  WBC 4.7  NEUTROABS 2,684  HGB 13.1  HCT 38.2  MCV 88.6  PLT 148   Lipid Panel: No results for input(s): "CHOL", "HDL", "LDLCALC", "TRIG", "CHOLHDL", "LDLDIRECT" in the last 8760 hours. TSH: No results for input(s): "TSH" in the last 8760 hours. A1C: Lab Results  Component Value Date   HGBA1C 5.1 08/22/2016     Assessment/Plan 1. Essential hypertension, benign BP is 160/80 on recheck. Amlopdipine was previously discontinued due to lower extremity swelling. Increase hydralazine. Patient keeps a log of BP at home. - hydrALAZINE (APRESOLINE) 50 MG tablet; Take 1 tablet (50 mg total) by mouth 3 (three) times daily.  Dispense: 90 tablet; Refill: 1 - COMPLETE METABOLIC PANEL WITH GFR  2. Hypothyroidism, unspecified type Stable. Check levels today - TSH  3. Chronic idiopathic constipation Encouraged patient to increase fluid and fiber intake. Educated about over the counter options such as miralax, milk of magnesia, or other stool softeners.   4. Hyponatremia Monitor  labs.   5. Chronic bilateral low back pain without sciatica Educated patient she can use tylenol for joint pain in addition to voltaren gel and lidocaine patches. She wants to discontinue the meloxicam when she finishes what she has at home.  6. Mitral regurgitation and aortic stenosis Stable, follows with Dr. Terri Skains.   Return in about 4 weeks (around 04/02/2022) for blood pressure check .: ***  Student- Archer Asa O'Berry ACPCNP-S  I personally was present during the history, physical exam and medical decision-making activities of this service and have verified that the service and findings are accurately documented in the student's note [place] K. [place], [facility] [phone]

## 2022-03-05 NOTE — Patient Instructions (Signed)
Miralax 17 gm daily with full glass of water daily  Stop mobic   Can take tylenol 1000 mg by mouth every 8 hours as needed pain- you can take this every night if needed   Increase hydralazine to 50 mg by mouth three time day ~every 6 hours while awake

## 2022-03-06 LAB — COMPLETE METABOLIC PANEL WITH GFR
AG Ratio: 2.4 (calc) (ref 1.0–2.5)
ALT: 12 U/L (ref 6–29)
AST: 13 U/L (ref 10–35)
Albumin: 4.1 g/dL (ref 3.6–5.1)
Alkaline phosphatase (APISO): 60 U/L (ref 37–153)
BUN: 17 mg/dL (ref 7–25)
CO2: 26 mmol/L (ref 20–32)
Calcium: 9.8 mg/dL (ref 8.6–10.4)
Chloride: 99 mmol/L (ref 98–110)
Creat: 0.75 mg/dL (ref 0.60–0.95)
Globulin: 1.7 g/dL (calc) — ABNORMAL LOW (ref 1.9–3.7)
Glucose, Bld: 86 mg/dL (ref 65–139)
Potassium: 4.9 mmol/L (ref 3.5–5.3)
Sodium: 132 mmol/L — ABNORMAL LOW (ref 135–146)
Total Bilirubin: 0.5 mg/dL (ref 0.2–1.2)
Total Protein: 5.8 g/dL — ABNORMAL LOW (ref 6.1–8.1)
eGFR: 75 mL/min/{1.73_m2} (ref >=60)

## 2022-03-06 LAB — TSH: TSH: 2.61 mIU/L (ref 0.40–4.50)

## 2022-03-18 ENCOUNTER — Other Ambulatory Visit: Payer: Self-pay | Admitting: Nurse Practitioner

## 2022-03-18 DIAGNOSIS — M25551 Pain in right hip: Secondary | ICD-10-CM

## 2022-04-06 ENCOUNTER — Encounter: Payer: Self-pay | Admitting: Nurse Practitioner

## 2022-04-06 ENCOUNTER — Ambulatory Visit
Admission: RE | Admit: 2022-04-06 | Discharge: 2022-04-06 | Disposition: A | Discharge status: Home / Self Care | Payer: Medicare PPO | Source: Ambulatory Visit | Attending: Nurse Practitioner | Admitting: Nurse Practitioner

## 2022-04-06 ENCOUNTER — Ambulatory Visit: Payer: Medicare PPO | Admitting: Nurse Practitioner

## 2022-04-06 VITALS — BP 124/74 | HR 73 | Temp 97.9°F | Ht 63.0 in | Wt 154.0 lb

## 2022-04-06 DIAGNOSIS — R053 Chronic cough: Secondary | ICD-10-CM | POA: Diagnosis not present

## 2022-04-06 DIAGNOSIS — I10 Essential (primary) hypertension: Secondary | ICD-10-CM

## 2022-04-06 DIAGNOSIS — R059 Cough, unspecified: Secondary | ICD-10-CM | POA: Diagnosis not present

## 2022-04-06 DIAGNOSIS — R5383 Other fatigue: Secondary | ICD-10-CM | POA: Diagnosis not present

## 2022-04-06 DIAGNOSIS — R0602 Shortness of breath: Secondary | ICD-10-CM | POA: Diagnosis not present

## 2022-04-06 LAB — CBC WITH DIFFERENTIAL/PLATELET
Absolute Monocytes: 700 cells/uL (ref 200–950)
Basophils Absolute: 22 cells/uL (ref 0–200)
Basophils Relative: 0.5 %
Eosinophils Absolute: 48 cells/uL (ref 15–500)
Eosinophils Relative: 1.1 %
HCT: 35.9 % (ref 35.0–45.0)
Hemoglobin: 11.8 g/dL (ref 11.7–15.5)
Lymphs Abs: 1140 cells/uL (ref 850–3900)
MCH: 29.2 pg (ref 27.0–33.0)
MCHC: 32.9 g/dL (ref 32.0–36.0)
MCV: 88.9 fL (ref 80.0–100.0)
MPV: 10 fL (ref 7.5–12.5)
Monocytes Relative: 15.9 %
Neutro Abs: 2490 cells/uL (ref 1500–7800)
Neutrophils Relative %: 56.6 %
Platelets: 166 10*3/uL (ref 140–400)
RBC: 4.04 10*6/uL (ref 3.80–5.10)
RDW: 12.4 % (ref 11.0–15.0)
Total Lymphocyte: 25.9 %
WBC: 4.4 10*3/uL (ref 3.8–10.8)

## 2022-04-06 MED ORDER — BENZONATATE 100 MG PO CAPS: 100.0000 mg | ORAL_CAPSULE | Freq: Two times a day (BID) | ORAL | 0 refills | Status: DC | PRN

## 2022-04-06 NOTE — Progress Notes (Signed)
Careteam: Patient Care Team: [name], NP as PCP - General (Geriatric Medicine) [name], OD as Consulting Physician (Optometry) Caffie Pinto., MD as Referring Physician (Orthopedic Surgery) Rex Kras, DO as Referring Physician (Cardiology)  PLACE OF SERVICE:  [place] Directive information Does Patient Have a Medical Advance Directive?: Yes, Type of Advance Directive: [place];Out of facility DNR (pink MOST or yellow form), Pre-existing out of facility DNR order (yellow form or pink MOST form): Yellow form placed in chart (order not valid for inpatient use), Does patient want to make changes to medical advance directive?: No - Patient declined  No Known Allergies  Chief Complaint  Patient presents with   Follow-up    4 week blood pressure follow-up      HPI: Patient is a 89 y.o. female for blood pressure follow up.  Reports blood pressure at home has been high. She takes her blood pressure prior to medication.  She is taking hydralazine 50 mg TID with metoprolol 25 mg daily.  No side effects or differences with increasing dose of medication.   Had a bad cold after she was here last and continues to have a bad cough that has persisted. Has not tried anything for cough    Review of Systems:  Review of Systems  Constitutional:  Negative for chills, fever and weight loss.  HENT:  Negative for tinnitus.   Respiratory:  Positive for cough and shortness of breath (only with activity). Negative for sputum production.   Cardiovascular:  Negative for chest pain, palpitations and leg swelling.  Gastrointestinal:  Negative for abdominal pain, constipation, diarrhea and heartburn.  Genitourinary:  Negative for dysuria, frequency and urgency.  Musculoskeletal:  Negative for back pain, falls, joint pain and myalgias.  Skin: Negative.   Neurological:  Negative for dizziness and headaches.  Psychiatric/Behavioral:  Negative for  depression and memory loss. The patient does not have insomnia.     Past Medical History:  Diagnosis Date   Aortic stenosis    Hemorrhage of gastrointestinal tract, unspecified    Hypertension    Migraine    Mitral regurgitation    OA (osteoarthritis)    Thyroid cyst    Past Surgical History:  Procedure Laterality Date   CATARACT EXTRACTION  1990   rt   CATARACT EXTRACTION  2005   left   LEFT HEART CATH AND CORONARY ANGIOGRAPHY N/A 10/06/2019   Procedure: LEFT HEART CATH AND CORONARY ANGIOGRAPHY;  Surgeon: Nigel Mormon, MD;  Location: [facility];  Service: Cardiovascular;  Laterality: N/A;   REVISION TOTAL KNEE ARTHROPLASTY  2008   SVT ABLATION N/A 01/18/2020   Procedure: SVT ABLATION;  Surgeon: Evans Lance, MD;  Location: [facility];  Service: Cardiovascular;  Laterality: N/A;   THUMB ARTHROSCOPY     TONSILLECTOMY     Social History:   reports that she quit smoking about 74 years ago. Her smoking use included cigarettes. She has never used smokeless tobacco. She reports that she does not currently use alcohol. She reports that she does not use drugs.  Family History  Problem Relation Age of Onset   CVA Father 75   Pancreatic cancer Mother    CVA Sister 39   Brain cancer Brother    Arthritis Sister    Heart disease Sister    Breast cancer Neg Hx     Medications: Patient's Medications  New Prescriptions   No medications on file  Previous Medications   BLOOD PRESSURE MONITORING KIT    Essential hypertension check bp daily   CALCIUM CARBONATE-VITAMIN D 600-400 MG-UNIT PER TABLET    Take 1 tablet by mouth in the morning and at bedtime.   DICLOFENAC SODIUM (VOLTAREN) 1 % GEL    Apply 4 g topically 4 (four) times daily as needed.   HYDRALAZINE (APRESOLINE) 50 MG TABLET    Take 1 tablet (50 mg total) by mouth 3 (three) times daily.   LEVOTHYROXINE (SYNTHROID) 100 MCG TABLET    TAKE 1 TABLET BY MOUTH EVERY DAY 30 MINUTES BEFORE BREAKFAST ON AN EMPTY  STOMACH   LIGHT MINERAL OIL-MINERAL OIL (RETAINE MGD) 0.5-0.5 % EMUL    Place 1 drop into both eyes in the morning and at bedtime.   LOSARTAN (COZAAR) 100 MG TABLET    TAKE 1 TABLET(100 MG) BY MOUTH EVERY MORNING   METOPROLOL SUCCINATE (TOPROL-XL) 25 MG 24 HR TABLET    TAKE 1 TABLET BY MOUTH EVERY MORNING, HOLD IF SYSTOLIC BLOOD PRESSURE LESS THAN 100 OR PULSE LESS THAN 55   MULTIPLE VITAMINS-MINERALS (PRESERVISION AREDS 2) CAPS    Take 1 capsule by mouth 2 (two) times a day.    OVER THE COUNTER MEDICATION    Take 2 capsules by mouth daily. MitoQ   VITAMIN B-12 (CYANOCOBALAMIN) 1000 MCG TABLET    Take 1 tablet (1,000 mcg total) by mouth daily.  Modified Medications   No medications on file  Discontinued Medications   No medications on file    Physical Exam:  Vitals:   04/06/22 1014  BP: 124/74  Pulse: 73  Temp: 97.9 F (36.6 C)  TempSrc: Temporal  SpO2: 99%  Weight: 154 lb (69.9 kg)  Height: 5\' 3"  (1.6 m)   Body mass index is 27.28 kg/m. Wt Readings from Last 3 Encounters:  04/06/22 154 lb (69.9 kg)  03/05/22 154 lb (69.9 kg)  12/15/21 149 lb 12.8 oz (67.9 kg)    Physical Exam Constitutional:      General: She is not in acute distress.    Appearance: She is well-developed. She is not diaphoretic.  HENT:     Head: Normocephalic and atraumatic.     Mouth/Throat:     Pharynx: No oropharyngeal exudate.  Eyes:     Conjunctiva/sclera: Conjunctivae normal.     Pupils: Pupils are equal, round, and reactive to light.  Cardiovascular:     Rate and Rhythm: Normal rate and regular rhythm.     Heart sounds: Normal heart sounds.  Pulmonary:     Effort: Pulmonary effort is normal.     Breath sounds: Normal breath sounds.  Abdominal:     General: Bowel sounds are normal.     Palpations: Abdomen is soft.  Musculoskeletal:     Cervical back: Normal range of motion and neck supple.     Right lower leg: No edema.     Left lower leg: No edema.  Skin:    General: Skin is warm and  dry.  Neurological:     Mental Status: She is alert.  Psychiatric:        Mood and Affect: Mood normal.     Labs reviewed: Basic Metabolic Panel: Recent Labs    08/21/21 1039 03/05/22 1057  NA 132* 132*  K 5.0 4.9  CL 98 99  CO2 28 26  GLUCOSE 86 86  BUN 18 17  CREATININE 0.79 0.75  CALCIUM 9.9 9.8  TSH  --  2.61  Liver Function Tests: Recent Labs    08/21/21 1039 03/05/22 1057  AST 15 13  ALT 17 12  BILITOT 0.6 0.5  PROT 5.9* 5.8*   No results for input(s): "LIPASE", "AMYLASE" in the last 8760 hours. No results for input(s): "AMMONIA" in the last 8760 hours. CBC: Recent Labs    08/21/21 1039  WBC 4.7  NEUTROABS 2,684  HGB 13.1  HCT 38.2  MCV 88.6  PLT 148   Lipid Panel: No results for input(s): "CHOL", "HDL", "LDLCALC", "TRIG", "CHOLHDL", "LDLDIRECT" in the last 8760 hours. TSH: Recent Labs    03/05/22 1057  TSH 2.61   A1C: Lab Results  Component Value Date   HGBA1C 5.1 08/22/2016     Assessment/Plan 1. Persistent cough Daughter reports more fatigue and pt states some shortness of breath on exertion since URI - benzonatate (TESSALON) 100 MG capsule; Take 1 capsule (100 mg total) by mouth 2 (two) times daily as needed for cough.  Dispense: 20 capsule; Refill: 0 - CBC with Differential/Platelet - DG Chest 2 View; Future to rule out pneumonia  2. Essential hypertension, benign -Blood pressure well controlled, goal bp <140/90 Continue current medications and dietary modifications follow metabolic panel   Strict return precautions given.  Return in about 5 months (around 09/06/2022) for routine follow up .  Carlos American. [place], [facility] [phone]

## 2022-04-06 NOTE — Patient Instructions (Signed)
Add nutritional supplement daily to the smallest meal of the day.   Can use benzonatate 100 mg twice daily as needed for cough  To walk into [facility] for chest xray to rule out pneumonia.

## 2022-04-11 DIAGNOSIS — H353123 Nonexudative age-related macular degeneration, left eye, advanced atrophic without subfoveal involvement: Secondary | ICD-10-CM | POA: Diagnosis not present

## 2022-04-11 DIAGNOSIS — H43813 Vitreous degeneration, bilateral: Secondary | ICD-10-CM | POA: Diagnosis not present

## 2022-04-11 DIAGNOSIS — H353114 Nonexudative age-related macular degeneration, right eye, advanced atrophic with subfoveal involvement: Secondary | ICD-10-CM | POA: Diagnosis not present

## 2022-04-29 ENCOUNTER — Other Ambulatory Visit: Payer: Self-pay | Admitting: Nurse Practitioner

## 2022-04-29 DIAGNOSIS — E038 Other specified hypothyroidism: Secondary | ICD-10-CM

## 2022-04-29 DIAGNOSIS — E041 Nontoxic single thyroid nodule: Secondary | ICD-10-CM

## 2022-05-02 ENCOUNTER — Other Ambulatory Visit: Payer: Self-pay | Admitting: Nurse Practitioner

## 2022-05-02 DIAGNOSIS — I10 Essential (primary) hypertension: Secondary | ICD-10-CM

## 2022-05-28 ENCOUNTER — Other Ambulatory Visit: Payer: Self-pay | Admitting: Cardiology

## 2022-05-28 DIAGNOSIS — I351 Nonrheumatic aortic (valve) insufficiency: Secondary | ICD-10-CM

## 2022-05-28 DIAGNOSIS — I350 Nonrheumatic aortic (valve) stenosis: Secondary | ICD-10-CM

## 2022-06-08 ENCOUNTER — Other Ambulatory Visit: Payer: Self-pay | Admitting: Cardiology

## 2022-06-08 DIAGNOSIS — I10 Essential (primary) hypertension: Secondary | ICD-10-CM

## 2022-06-19 ENCOUNTER — Ambulatory Visit: Payer: Medicare PPO | Admitting: Cardiology

## 2022-06-27 ENCOUNTER — Ambulatory Visit: Payer: Medicare PPO

## 2022-06-27 DIAGNOSIS — I351 Nonrheumatic aortic (valve) insufficiency: Secondary | ICD-10-CM

## 2022-06-27 DIAGNOSIS — I350 Nonrheumatic aortic (valve) stenosis: Secondary | ICD-10-CM

## 2022-06-28 DIAGNOSIS — H353134 Nonexudative age-related macular degeneration, bilateral, advanced atrophic with subfoveal involvement: Secondary | ICD-10-CM | POA: Diagnosis not present

## 2022-06-28 DIAGNOSIS — H524 Presbyopia: Secondary | ICD-10-CM | POA: Diagnosis not present

## 2022-06-28 DIAGNOSIS — H43813 Vitreous degeneration, bilateral: Secondary | ICD-10-CM | POA: Diagnosis not present

## 2022-06-28 DIAGNOSIS — H5213 Myopia, bilateral: Secondary | ICD-10-CM | POA: Diagnosis not present

## 2022-06-28 DIAGNOSIS — H52223 Regular astigmatism, bilateral: Secondary | ICD-10-CM | POA: Diagnosis not present

## 2022-06-28 DIAGNOSIS — H04123 Dry eye syndrome of bilateral lacrimal glands: Secondary | ICD-10-CM | POA: Diagnosis not present

## 2022-06-28 DIAGNOSIS — H02831 Dermatochalasis of right upper eyelid: Secondary | ICD-10-CM | POA: Diagnosis not present

## 2022-07-06 ENCOUNTER — Encounter: Payer: Self-pay | Admitting: Cardiology

## 2022-07-06 ENCOUNTER — Ambulatory Visit: Payer: Medicare PPO | Admitting: Cardiology

## 2022-07-06 VITALS — BP 140/65 | HR 74 | Ht 63.0 in | Wt 146.0 lb

## 2022-07-06 DIAGNOSIS — R0609 Other forms of dyspnea: Secondary | ICD-10-CM

## 2022-07-06 DIAGNOSIS — I471 Supraventricular tachycardia, unspecified: Secondary | ICD-10-CM | POA: Diagnosis not present

## 2022-07-06 DIAGNOSIS — I351 Nonrheumatic aortic (valve) insufficiency: Secondary | ICD-10-CM | POA: Diagnosis not present

## 2022-07-06 DIAGNOSIS — I10 Essential (primary) hypertension: Secondary | ICD-10-CM

## 2022-07-06 DIAGNOSIS — I350 Nonrheumatic aortic (valve) stenosis: Secondary | ICD-10-CM | POA: Diagnosis not present

## 2022-07-06 DIAGNOSIS — Z87891 Personal history of nicotine dependence: Secondary | ICD-10-CM | POA: Diagnosis not present

## 2022-07-06 NOTE — Progress Notes (Signed)
ID:  Donna [name], DOB 06-06-1927, MRN [id number]  PCP:  Sharon Seller, NP  Cardiologist:  Tessa Lerner, DO, [facility] (established care 08/26/2019)  Date: 07/06/22 Last Office Visit: 12/15/2021  Chief Complaint  Patient presents with   Nonrheumatic aortic valve stenosis   Follow-up   Results    HPI  Donna [name] is a 89 y.o. 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  Was referred to the practice for evaluation of palpitations.  Workup illustrated PSVT.  Referred to EP and eventually underwent EP study and radiofrequency ablation of the slow pathway and since then her symptoms have been much better controlled with reduced hospitalizations.    During a cardiovascular workup patient was noted to have mixed aortic valve disease with both stenosis and regurgitation.  She had an echocardiogram prior to today's office visit which notes progression of her valvular heart disease from mild to moderate to moderate AS.  Clinically today she denies anginal chest pain, heart failure symptoms, near-syncope or syncopal events.  Home blood pressure ranges between 120-140 mmHg.  Patient is accompanied by her daughter at today's office visit.  ALLERGIES: No Known Allergies  MEDICATION LIST PRIOR TO VISIT: Current Meds  Medication Sig   Blood Pressure Monitoring KIT Essential hypertension check bp daily   Calcium Carbonate-Vit D-Min (CALTRATE 600+D PLUS MINERALS) 600-800 MG-UNIT CHEW Chew by mouth.   Calcium Carbonate-Vitamin D 600-400 MG-UNIT per tablet Take 1 tablet by mouth in the morning and at bedtime.   diclofenac Sodium (VOLTAREN) 1 % GEL Apply 4 g topically 4 (four) times daily as needed.   hydrALAZINE (APRESOLINE) 50 MG tablet TAKE 1 TABLET(50 MG) BY MOUTH THREE TIMES DAILY    levothyroxine (SYNTHROID) 100 MCG tablet TAKE 1 TABLET BY MOUTH EVERY DAY 30 MINUTES BEFORE BREAKFAST ON AN EMPTY STOMACH   Light Mineral Oil-Mineral Oil (RETAINE MGD) 0.5-0.5 % EMUL Place 1 drop into both eyes in the morning and at bedtime.   losartan (COZAAR) 100 MG tablet TAKE 1 TABLET(100 MG) BY MOUTH EVERY MORNING   metoprolol succinate (TOPROL-XL) 25 MG 24 hr tablet TAKE 1 TABLET BY MOUTH EVERY MORNING, HOLD IF SYSTOLIC BLOOD PRESSURE LESS THAN 100 OR PULSE LESS THAN 55   Multiple Vitamins-Minerals (PRESERVISION AREDS 2) CAPS Take 1 capsule by mouth 2 (two) times a day.    OVER THE COUNTER MEDICATION Take 2 capsules by mouth daily. MitoQ   vitamin B-12 (CYANOCOBALAMIN) 1000 MCG tablet Take 1 tablet (1,000 mcg total) by mouth daily.   [DISCONTINUED] benzonatate (TESSALON) 100 MG capsule Take 1 capsule (100 mg total) by mouth 2 (two) times daily as needed for cough.     PAST MEDICAL HISTORY: Past Medical History:  Diagnosis Date   Aortic stenosis    Hemorrhage of gastrointestinal tract, unspecified    Hypertension    Migraine    Mitral regurgitation    OA (osteoarthritis)    Thyroid cyst     PAST SURGICAL HISTORY: Past Surgical History:  Procedure Laterality Date   CATARACT EXTRACTION  1990   rt   CATARACT EXTRACTION  2005   left   LEFT HEART CATH AND CORONARY ANGIOGRAPHY N/A 10/06/2019   Procedure: LEFT HEART CATH AND CORONARY ANGIOGRAPHY;  Surgeon: Elder Negus, MD;  Location: [facility] INVASIVE CV LAB;  Service: Cardiovascular;  Laterality: N/A;  REVISION TOTAL KNEE ARTHROPLASTY  2008   SVT ABLATION N/A 01/18/2020   Procedure: SVT ABLATION;  Surgeon: Marinus Maw, MD;  Location: [facility] INVASIVE CV LAB;  Service: Cardiovascular;  Laterality: N/A;   THUMB ARTHROSCOPY     TONSILLECTOMY      FAMILY HISTORY: The patient family history includes Arthritis in her sister; Brain cancer in her brother; CVA (age of onset: 61) in her sister; CVA (age of onset: 81) in her father; Heart  disease in her sister; Pancreatic cancer in her mother.  SOCIAL HISTORY:  The patient  reports that she quit smoking about 74 years ago. Her smoking use included cigarettes. She has never used smokeless tobacco. She reports that she does not currently use alcohol. She reports that she does not use drugs.  REVIEW OF SYSTEMS: Review of Systems  Constitutional: Negative for chills and fever.  HENT:  Negative for hoarse voice and nosebleeds.   Eyes:  Negative for discharge, double vision and pain.  Cardiovascular:  Positive for dyspnea on exertion (chronic). Negative for chest pain, claudication, leg swelling, near-syncope, orthopnea, palpitations, paroxysmal nocturnal dyspnea and syncope.  Respiratory:  Negative for hemoptysis and shortness of breath.   Musculoskeletal:  Negative for muscle cramps and myalgias.  Gastrointestinal:  Negative for abdominal pain, constipation, diarrhea, hematemesis, hematochezia, melena, nausea and vomiting.  Neurological:  Negative for dizziness, light-headedness and vertigo.    PHYSICAL EXAM:    07/06/2022    3:33 PM 07/06/2022    3:27 PM 04/06/2022   10:14 AM  Vitals with BMI  Height  5\' 3"  5\' 3"   Weight  146 lbs 154 lbs  BMI  25.87 27.29  Systolic 140 164 865  Diastolic 65 66 74  Pulse 74 80 73   Physical Exam  Constitutional: No distress.  Age appropriate, hemodynamically stable.   Neck: No JVD present.  Cardiovascular: Normal rate, regular rhythm, S1 normal, S2 normal and intact distal pulses. Exam reveals no gallop, no S3 and no S4.  Murmur heard. Harsh midsystolic murmur is present with a grade of 3/6 at the upper right sternal border radiating to the neck. Pulses:      Carotid pulses are 1+ on the right side and 1+ on the left side. Pulmonary/Chest: Effort normal and breath sounds normal. No stridor. She has no wheezes. She has no rales.  Abdominal: Soft. Bowel sounds are normal. She exhibits no distension. There is no abdominal tenderness.   Musculoskeletal:        General: No edema.     Cervical back: Neck supple.  Neurological: She is alert and oriented to person, place, and time. She has intact cranial nerves (2-12).  Skin: Skin is warm and moist.   CARDIAC DATABASE: EKG: July 06, 2022: Sinus rhythm, 75 bpm, ST-T changes in lateral leads consider ischemia, consider old anterior infarct.  No significant change compared to prior tracing 12/15/2021.  Echocardiogram: 03/24/2020: LVEF 60 to 65%, no regional wall motion abnormalities, mild LVH, normal diastolic function, normal right ventricular size and function, mild MR, no MS, mild aortic regurgitation, moderate aortic stenosis (mean gradient 25 mmHg, peak velocity 3.2 m/s), estimated RAP 3 mmHg.  Jun 07, 2021: LVEF 60 to 65%, moderate LVH, grade 2 diastolic dysfunction, elevated LVEDP, normal PASP, moderately dilated left atrium, mild to moderate MR, no mitral stenosis, moderate MAC, moderate aortic regurgitation, mild aortic stenosis (peak velocity 3.09 m/s, mean gradient 23 mmHg, AVA per VTI 1.33 cm, dimensional index 0.42).   Personally reviewed the  echo, LVEF is preserved, grade 2 diastolic dysfunction, elevated left ventricular filling pressures/LAP, mild/moderate aortic stenosis and mild regurgitation.  Estimated RAP 3 mmHg.  06/27/2022:  Hyperdynamic LV systolic function with visual EF >70%. Left ventricle  cavity is normal in size. Moderate left ventricular hypertrophy. Normal  global wall motion. Indeterminate diastolic filling pattern, normal LAP.  Left atrial cavity is severely dilated at 73 ml/m^2.  Native aortic valve. Moderate aortic stenosis. Peak velocity  3.79m/s,  Peak Pressure Gradient  52.6 mmHg, Mean Gradient 23.5 mmHg, AVA 1.4 cm,  Dimensionless Index 0.4  Moderate (Grade II) aortic regurgitation.  Mitral annular calcification. Moderate (Grade II) mitral regurgitation.  Moderate tricuspid regurgitation. Mild pulmonary hypertension. RVSP  measures 41  mmHg.  Prior study 06/07/21: Preserved LVEF, grade 2 diastolic dysfunction,  elevated left ventricular filling pressures/LAP, mild/moderate aortic  stenosis and mild AR regurgitation, moderate LAE, normal PASP.   Stress Testing: None  Heart Catheterization: 10/06/2019: LM: Normal LAD: Mild luminal irregularities LCx: Normal RCA: Normal   LVEDP 14 mmHg 60 mmHg mid-cavitary gradient on pullback No significant LV-Ao gradient on pullback  Carotid artery duplex  09/09/2019:  Minimal stenosis in the right internal carotid artery (minimal).  Minimal stenosis in the left internal carotid artery (minimal).  Minimal plaque noted in the CCA bilaterally without significant stenosis.  Antegrade right vertebral artery flow. Antegrade left vertebral artery flow.  Follow up studies if clinically indicated.  7 day extended Holter monitor: Predominantly normal sinus rhythm (NSR).  HR 40-210 bpm.  Avg HR 79 bpm. Minimum HR 40 bpm on 08/29/19 at 1:02pm sinus with junctional escape. No atrial fibrillation/NSVT/pause (3 secs or longer). Total ventricular ectopic burden <1%. Total supraventricular ectopic burden <1%. 42 episodes of SVT.  Fastest and longest run was 1hr 17 mins with max HR 210 bpm (avg 155 bpm). Patient triggered events: 6.  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   LABORATORY DATA:    Latest Ref Rng & Units 04/06/2022   10:38 AM 08/21/2021   10:39 AM 02/20/2021   10:38 AM  CBC  WBC 3.8 - 10.8 Thousand/uL 4.4  4.7  5.4   Hemoglobin 11.7 - 15.5 g/dL 16.1  09.6  04.5   Hematocrit 35.0 - 45.0 % 35.9  38.2  38.4   Platelets 140 - 400 Thousand/uL 166  148  172        Latest Ref Rng & Units 03/05/2022   10:57 AM 08/21/2021   10:39 AM 02/28/2021   10:26 AM  CMP  Glucose 65 - 139 mg/dL 86  86  79   BUN 7 - 25 mg/dL 17  18  19    Creatinine 0.60 - 0.95 mg/dL 4.09  8.11  9.14   Sodium 135 - 146 mmol/L 132  132  130    Potassium 3.5 - 5.3 mmol/L 4.9  5.0  4.8   Chloride 98 - 110 mmol/L 99  98  97   CO2 20 - 32 mmol/L 26  28  30    Calcium 8.6 - 10.4 mg/dL 9.8  9.9  9.8   Total Protein 6.1 - 8.1 g/dL 5.8  5.9    Total Bilirubin 0.2 - 1.2 mg/dL 0.5  0.6    AST 10 - 35 U/L 13  15    ALT 6 - 29 U/L 12  17      Lipid Panel  Lab Results  Component Value Date   CHOL 112 12/08/2018  HDL 34 (L) 12/08/2018   LDLCALC 62 12/08/2018   TRIG 80 12/08/2018   CHOLHDL 3.3 12/08/2018    No components found for: "NTPROBNP" No results for input(s): "PROBNP" in the last 8760 hours. Recent Labs    03/05/22 1057  TSH 2.61    BMP Recent Labs    08/21/21 1039 03/05/22 1057  NA 132* 132*  K 5.0 4.9  CL 98 99  CO2 28 26  GLUCOSE 86 86  BUN 18 17  CREATININE 0.79 0.75  CALCIUM 9.9 9.8    HEMOGLOBIN A1C Lab Results  Component Value Date   HGBA1C 5.1 08/22/2016   MPG 100 08/22/2016    IMPRESSION:    ICD-10-CM   1. Nonrheumatic aortic valve stenosis  I35.0 EKG 12-Lead    ECHOCARDIOGRAM COMPLETE    2. Nonrheumatic aortic valve insufficiency  I35.1     3. Essential hypertension, benign  I10     4. Paroxysmal SVT (supraventricular tachycardia)  I47.10     5. Dyspnea on exertion  R06.09     6. Former smoker  Z87.891       RECOMMENDATIONS: Doreather Hoxworth is a 89 y.o. female whose past medical history and cardiac risk factors include: hypertension, mild OSA, supraventricular tachycardia, mixed aortic valve disease, mild MR, migraine, GI bleeding, postmenopausal female, advanced age.  Nonrheumatic aortic valve stenosis / aortic valve insufficiency Asymptomatic.  Most recent echocardiogram demonstrates moderate aortic stenosis.  Denies angina pectoris, heart failure symptoms, near-syncope/syncope. Repeat echo in 6 months to reevaluate disease progression. Monitor for now.  Essential hypertension, benign Home and office blood pressures acceptable.  Monitor to keep blood pressure log at home  and if SBP's are consistently >140 mmHg would recommend up titration of medical therapy Medications reconciled.   Of note amlodipine was discontinued in the past due to lower extremity swelling Reemphasized importance of a low-salt diet.  Paroxysmal SVT (supraventricular tachycardia) (HCC) Underwent EP study and radiofrequency ablation of her slow pathway by Dr. Ladona Ridgel. Continue current dose of Toprol-XL.  Dyspnea on exertion Chronic and stable. Multifactorial: Valvular heart disease, seasonal allergies, hypertension. Valvular heart disease as discussed above.   FINAL MEDICATION LIST END OF ENCOUNTER: No orders of the defined types were placed in this encounter.   Medications Discontinued During This Encounter  Medication Reason   benzonatate (TESSALON) 100 MG capsule Completed Course    Current Outpatient Medications:    Blood Pressure Monitoring KIT, Essential hypertension check bp daily, Disp: 1 kit, Rfl: 0   Calcium Carbonate-Vit D-Min (CALTRATE 600+D PLUS MINERALS) 600-800 MG-UNIT CHEW, Chew by mouth., Disp: , Rfl:    Calcium Carbonate-Vitamin D 600-400 MG-UNIT per tablet, Take 1 tablet by mouth in the morning and at bedtime., Disp: , Rfl:    diclofenac Sodium (VOLTAREN) 1 % GEL, Apply 4 g topically 4 (four) times daily as needed., Disp: 150 g, Rfl: 3   hydrALAZINE (APRESOLINE) 50 MG tablet, TAKE 1 TABLET(50 MG) BY MOUTH THREE TIMES DAILY, Disp: 270 tablet, Rfl: 1   levothyroxine (SYNTHROID) 100 MCG tablet, TAKE 1 TABLET BY MOUTH EVERY DAY 30 MINUTES BEFORE BREAKFAST ON AN EMPTY STOMACH, Disp: 90 tablet, Rfl: 3   Light Mineral Oil-Mineral Oil (RETAINE MGD) 0.5-0.5 % EMUL, Place 1 drop into both eyes in the morning and at bedtime., Disp: , Rfl:    losartan (COZAAR) 100 MG tablet, TAKE 1 TABLET(100 MG) BY MOUTH EVERY MORNING, Disp: 90 tablet, Rfl: 1   metoprolol succinate (TOPROL-XL) 25 MG 24 hr tablet,  TAKE 1 TABLET BY MOUTH EVERY MORNING, HOLD IF SYSTOLIC BLOOD PRESSURE LESS THAN  100 OR PULSE LESS THAN 55, Disp: 90 tablet, Rfl: 1   Multiple Vitamins-Minerals (PRESERVISION AREDS 2) CAPS, Take 1 capsule by mouth 2 (two) times a day. , Disp: , Rfl:    OVER THE COUNTER MEDICATION, Take 2 capsules by mouth daily. MitoQ, Disp: , Rfl:    vitamin B-12 (CYANOCOBALAMIN) 1000 MCG tablet, Take 1 tablet (1,000 mcg total) by mouth daily., Disp: 30 tablet, Rfl: 5  Orders Placed This Encounter  Procedures   EKG 12-Lead   ECHOCARDIOGRAM COMPLETE   There are no Patient Instructions on file for this visit.   --Continue cardiac medications as reconciled in final medication list. --Return in about 6 months (around 01/08/2023) for Follow up Aortic stenosis. Or sooner if needed. --Continue follow-up with your primary care physician regarding the management of your other chronic comorbid conditions.  Patient's questions and concerns were addressed to her satisfaction. She voices understanding of the instructions provided during this encounter.   This note was created using a voice recognition software as a result there may be grammatical errors inadvertently enclosed that do not reflect the nature of this encounter. Every attempt is made to correct such errors.  Tessa Lerner, [place], [facility]  Pager:  [phone] Office: [phone]

## 2022-07-30 ENCOUNTER — Other Ambulatory Visit: Payer: Self-pay | Admitting: Cardiology

## 2022-07-30 DIAGNOSIS — I471 Supraventricular tachycardia, unspecified: Secondary | ICD-10-CM

## 2022-07-30 DIAGNOSIS — I350 Nonrheumatic aortic (valve) stenosis: Secondary | ICD-10-CM

## 2022-09-21 ENCOUNTER — Ambulatory Visit (HOSPITAL_COMMUNITY)
Admission: RE | Admit: 2022-09-21 | Discharge: 2022-09-21 | Disposition: A | Discharge status: Home / Self Care | Payer: Medicare PPO | Source: Ambulatory Visit | Attending: Cardiology | Admitting: Cardiology

## 2022-09-21 DIAGNOSIS — I119 Hypertensive heart disease without heart failure: Secondary | ICD-10-CM | POA: Diagnosis not present

## 2022-09-21 DIAGNOSIS — I080 Rheumatic disorders of both mitral and aortic valves: Secondary | ICD-10-CM | POA: Diagnosis not present

## 2022-09-21 DIAGNOSIS — I350 Nonrheumatic aortic (valve) stenosis: Secondary | ICD-10-CM

## 2022-10-08 DIAGNOSIS — I350 Nonrheumatic aortic (valve) stenosis: Secondary | ICD-10-CM | POA: Insufficient documentation

## 2022-10-08 LAB — ECHOCARDIOGRAM COMPLETE
AR max vel: 1.48 cm2
AV Area VTI: 1.58 cm2
AV Area mean vel: 1.72 cm2
AV Mean grad: 23.3 mmHg
AV Peak grad: 39.4 mmHg
Ao pk vel: 3.14 m/s
Area-P 1/2: 2.79 cm2
MV VTI: 4.58 cm2
P 1/2 time: 420 msec
S' Lateral: 1.5 cm

## 2022-10-15 ENCOUNTER — Encounter: Payer: Medicare PPO | Admitting: Nurse Practitioner

## 2022-10-19 DIAGNOSIS — H353123 Nonexudative age-related macular degeneration, left eye, advanced atrophic without subfoveal involvement: Secondary | ICD-10-CM | POA: Diagnosis not present

## 2022-10-19 DIAGNOSIS — H353114 Nonexudative age-related macular degeneration, right eye, advanced atrophic with subfoveal involvement: Secondary | ICD-10-CM | POA: Diagnosis not present

## 2022-10-19 DIAGNOSIS — H43813 Vitreous degeneration, bilateral: Secondary | ICD-10-CM | POA: Diagnosis not present

## 2022-10-29 ENCOUNTER — Other Ambulatory Visit: Payer: Self-pay | Admitting: Nurse Practitioner

## 2022-10-29 DIAGNOSIS — I10 Essential (primary) hypertension: Secondary | ICD-10-CM

## 2022-11-05 ENCOUNTER — Other Ambulatory Visit: Payer: Self-pay | Admitting: Cardiology

## 2022-11-05 DIAGNOSIS — I471 Supraventricular tachycardia, unspecified: Secondary | ICD-10-CM

## 2022-11-05 DIAGNOSIS — I350 Nonrheumatic aortic (valve) stenosis: Secondary | ICD-10-CM

## 2022-11-09 ENCOUNTER — Ambulatory Visit (INDEPENDENT_AMBULATORY_CARE_PROVIDER_SITE_OTHER): Payer: Medicare PPO | Admitting: Nurse Practitioner

## 2022-11-09 ENCOUNTER — Encounter: Payer: Self-pay | Admitting: Nurse Practitioner

## 2022-11-09 VITALS — BP 132/74 | HR 92 | Temp 96.6°F | Ht 62.0 in | Wt 148.0 lb

## 2022-11-09 DIAGNOSIS — Z0000 Encounter for general adult medical examination without abnormal findings: Secondary | ICD-10-CM | POA: Diagnosis not present

## 2022-11-09 NOTE — Progress Notes (Signed)
Subjective:   Donna [name] is a 89 y.o. female who presents for Medicare Annual (Subsequent) preventive examination.  Visit Complete: In person at [facility]  Cardiac Risk Factors include: advanced age (>36men, >65 women);hypertension;sedentary lifestyle     Objective:    Today's Vitals   11/09/22 0846  BP: 132/74  Pulse: 92  Temp: (!) 96.6 F (35.9 C)  TempSrc: Temporal  SpO2: 97%  Weight: 148 lb (67.1 kg)  Height: 5\' 2"  (1.575 m)   Body mass index is 27.07 kg/m.     11/09/2022    8:12 AM 04/06/2022    9:56 AM 10/06/2021    3:13 PM 08/21/2021    9:51 AM 04/26/2021    1:37 PM 02/20/2021   10:05 AM 09/27/2020    3:55 PM  Advanced Directives  Does Patient Have a Medical Advance Directive? Yes Yes Yes Yes Yes Yes Yes  Type of Estate agent of [place];Out of facility DNR (pink MOST or yellow form) Healthcare Power of [place];Out of facility DNR (pink MOST or yellow form) Healthcare Power of [place];Out of facility DNR (pink MOST or yellow form) Healthcare Power of [place];Out of facility DNR (pink MOST or yellow form) Healthcare Power of [place];Out of facility DNR (pink MOST or yellow form) Healthcare Power of [place];Out of facility DNR (pink MOST or yellow form) Healthcare Power of [place];Out of facility DNR (pink MOST or yellow form)  Does patient want to make changes to medical advance directive? No - Patient declined No - Patient declined No - Patient declined No - Patient declined No - Patient declined No - Patient declined No - Patient declined  Copy of Healthcare Power of Attorney in Chart? Yes - validated most recent copy scanned in chart (See row information) Yes - validated most recent copy scanned in chart (See row information) Yes - validated most recent copy scanned in chart (See row information) Yes - validated most recent copy scanned in chart (See row information) Yes - validated most recent copy scanned in chart (See row information) Yes -  validated most recent copy scanned in chart (See row information) Yes - validated most recent copy scanned in chart (See row information)  Pre-existing out of facility DNR order (yellow form or pink MOST form) Yellow form placed in chart (order not valid for inpatient use) Yellow form placed in chart (order not valid for inpatient use)  Yellow form placed in chart (order not valid for inpatient use)  Yellow form placed in chart (order not valid for inpatient use) Yellow form placed in chart (order not valid for inpatient use)    Current Medications (verified) Outpatient Encounter Medications as of 11/09/2022  Medication Sig   Blood Pressure Monitoring KIT Essential hypertension check bp daily   Calcium Carbonate-Vit D-Min (CALTRATE 600+D PLUS MINERALS) 600-800 MG-UNIT CHEW Chew by mouth.   Calcium Carbonate-Vitamin D 600-400 MG-UNIT per tablet Take 1 tablet by mouth in the morning and at bedtime.   hydrALAZINE (APRESOLINE) 50 MG tablet TAKE 1 TABLET(50 MG) BY MOUTH THREE TIMES DAILY   levothyroxine (SYNTHROID) 100 MCG tablet TAKE 1 TABLET BY MOUTH EVERY DAY 30 MINUTES BEFORE BREAKFAST ON AN EMPTY STOMACH   Light Mineral Oil-Mineral Oil (RETAINE MGD) 0.5-0.5 % EMUL Place 1 drop into both eyes in the morning and at bedtime.   losartan (COZAAR) 100 MG tablet TAKE 1 TABLET(100 MG) BY MOUTH EVERY MORNING   metoprolol succinate (TOPROL-XL) 25 MG 24 hr tablet TAKE 1 TABLET BY MOUTH EVERY MORNING,  HOLD IF SYSTOLIC BLOOD PRESSURE LESS THAN 100 OR PULSE LESS THAN 55   Multiple Vitamins-Minerals (PRESERVISION AREDS 2) CAPS Take 1 capsule by mouth 2 (two) times a day.    OVER THE COUNTER MEDICATION Take 2 capsules by mouth daily. MitoQ   vitamin B-12 (CYANOCOBALAMIN) 1000 MCG tablet Take 1 tablet (1,000 mcg total) by mouth daily.   diclofenac Sodium (VOLTAREN) 1 % GEL Apply 4 g topically 4 (four) times daily as needed. (Patient not taking: Reported on 11/09/2022)   No facility-administered encounter  medications on file as of 11/09/2022.    Allergies (verified) Patient has no known allergies.   History: Past Medical History:  Diagnosis Date   Aortic stenosis    Hemorrhage of gastrointestinal tract, unspecified    Hypertension    Migraine    Mitral regurgitation    OA (osteoarthritis)    Thyroid cyst    Past Surgical History:  Procedure Laterality Date   CATARACT EXTRACTION  1990   rt   CATARACT EXTRACTION  2005   left   LEFT HEART CATH AND CORONARY ANGIOGRAPHY N/A 10/06/2019   Procedure: LEFT HEART CATH AND CORONARY ANGIOGRAPHY;  Surgeon: Elder Negus, MD;  Location: [facility] INVASIVE CV [facility];  Service: Cardiovascular;  Laterality: N/A;   REVISION TOTAL KNEE ARTHROPLASTY  2008   SVT ABLATION N/A 01/18/2020   Procedure: SVT ABLATION;  Surgeon: Marinus Maw, MD;  Location: [facility] INVASIVE CV LAB;  Service: Cardiovascular;  Laterality: N/A;   THUMB ARTHROSCOPY     TONSILLECTOMY     Family History  Problem Relation Age of Onset   CVA Father 54   Pancreatic cancer Mother    CVA Sister 50   Brain cancer Brother    Arthritis Sister    Heart disease Sister    Breast cancer Neg Hx    Social History   Socioeconomic History   Marital status: Widowed    Spouse name: Not on file   Number of children: 2   Years of education: 26   Highest education level: Not on file  Occupational History   Not on file  Tobacco Use   Smoking status: Former    Current packs/day: 0.00    Types: Cigarettes    Start date: 01/15/1946    Quit date: 01/16/1948    Years since quitting: 74.8   Smokeless tobacco: Never   Tobacco comments:    smoked on occ when she was a teen  Vaping Use   Vaping status: Never Used  Substance and Sexual Activity   Alcohol use: Not Currently   Drug use: No   Sexual activity: Not on file  Other Topics Concern   Not on file  Social History Narrative   Right handed   Decaf coffee, tea sometimes   Lives alone   Social Determinants of Health   Financial  Resource Strain: Low Risk  (09/11/2017)   Overall Financial Resource Strain (CARDIA)    Difficulty of Paying Living Expenses: Not hard at all  Food Insecurity: No Food Insecurity (09/11/2017)   Hunger Vital Sign    Worried About Running Out of Food in the Last Year: Never true    Ran Out of Food in the Last Year: Never true  Transportation Needs: No Transportation Needs (09/11/2017)   PRAPARE - Administrator, Civil Service (Medical): No    Lack of Transportation (Non-Medical): No  Physical Activity: Inactive (09/11/2017)   Exercise Vital Sign    Days of Exercise  per Week: 0 days    Minutes of Exercise per Session: 0 min  Stress: Stress Concern Present (09/11/2017)   [name] of Occupational Health - Occupational Stress Questionnaire    Feeling of Stress : To some extent  Social Connections: Moderately Integrated (09/11/2017)   Social Connection and Isolation Panel [NHANES]    Frequency of Communication with Friends and Family: More than three times a week    Frequency of Social Gatherings with Friends and Family: More than three times a week    Attends Religious Services: More than 4 times per year    Active Member of [employer] or Organizations: No    Attends Engineer, structural: Never    Marital Status: Married    Tobacco Counseling Counseling given: Not Answered Tobacco comments: smoked on occ when she was a teen   Clinical Intake:  Pre-visit preparation completed: Yes  Pain : No/denies pain     BMI - recorded: 27 Nutritional Status: BMI 25 -29 Overweight Nutritional Risks: None Diabetes: No  How often do you need to have someone help you when you read instructions, pamphlets, or other written materials from your doctor or pharmacy?: 1 - Never         Activities of Daily Living    11/09/2022    8:59 AM  In your present state of health, do you have any difficulty performing the following activities:  Hearing? 1  Vision? 1  Difficulty  concentrating or making decisions? 1  Comment trouble remembering things  Walking or climbing stairs? 1  Comment slow  Dressing or bathing? 0  Doing errands, shopping? 1  Comment does not drive  Preparing Food and eating ? N  Using the Toilet? N  In the past six months, have you accidently leaked urine? N  Do you have problems with loss of bowel control? N  Managing your Medications? N  Managing your Finances? N  Housekeeping or managing your Housekeeping? N    Patient Care Team: Sharon Seller, NP as PCP - General (Geriatric Medicine) Everardo Pacific, OD as Consulting Physician (Optometry) Julius Bowels Marletta Lor., MD as Referring Physician (Orthopedic Surgery) Tessa Lerner, DO as Referring Physician (Cardiology)  Indicate any recent Medical Services you may have received from other than [facility] providers in the past year (date may be approximate).     Assessment:   This is a routine wellness examination for Halle.  Hearing/Vision screen Hearing Screening - Comments:: No hearing issues  Vision Screening - Comments:: Last eye exam less than 12 months ago, [employer]    Goals Addressed   None    Depression Screen    11/09/2022    8:43 AM 10/06/2021    3:10 PM 08/21/2021   10:09 AM 09/27/2020    3:52 PM 08/24/2020    9:59 AM 04/22/2020    1:38 PM 12/21/2019   11:07 AM  PHQ 2/9 Scores  PHQ - 2 Score 0 0 0 0 0 0 0    Fall Risk    11/09/2022    8:43 AM 03/05/2022   10:04 AM 10/06/2021    3:10 PM 08/21/2021    9:50 AM 04/26/2021    1:37 PM  Fall Risk   Falls in the past year? 0 1 0 0 0  Number falls in past yr: 0 0 0 0 0  Injury with Fall?  0 0 0 0  Risk for fall due to : No Fall Risks History of fall(s) No Fall Risks  No Fall Risks No Fall Risks  Follow up Falls evaluation completed Falls evaluation completed Falls evaluation completed Falls evaluation completed Falls evaluation completed    MEDICARE RISK AT HOME: Medicare Risk at Home Any stairs in or around the  home?: No Home free of loose throw rugs in walkways, pet beds, electrical cords, etc?: Yes Adequate lighting in your home to reduce risk of falls?: Yes Life alert?: Yes Use of a cane, walker or w/c?: Yes Grab bars in the bathroom?: Yes Shower chair or bench in shower?: Yes Elevated toilet seat or a handicapped toilet?: Yes  TIMED UP AND GO:  Was the test performed?  No    Cognitive Function:    11/09/2022    8:48 AM 09/16/2018    9:11 AM 09/11/2017    9:35 AM 08/22/2016    9:38 AM 08/19/2015    9:07 AM  MMSE - Mini Mental State Exam  Orientation to time 4 5 5 5 5   Orientation to Place 5 5 5 5 5   Registration 3 3 3 3 3   Attention/ Calculation 5 5 5 5 5   Recall 1 3 3 2 3   Language- name 2 objects 2 2 2 2 2   Language- repeat 1 1 1 1 1   Language- follow 3 step command 3 3 3 3 3   Language- read & follow direction 1 1 1 1 1   Write a sentence 1 1 1 1 1   Copy design 1 1 1 1 1   Total score 27 30 30 29 30         10/06/2021    3:11 PM 09/27/2020    3:56 PM 09/22/2019    8:53 AM  6CIT Screen  What Year? 0 points 0 points 0 points  What month? 0 points 0 points 0 points  What time? 0 points 0 points 0 points  Count back from 20 0 points 0 points 0 points  Months in reverse 0 points 0 points 0 points  Repeat phrase 0 points 0 points 4 points  Total Score 0 points 0 points 4 points    Immunizations Immunization History  Administered Date(s) Administered   Fluad Quad(high Dose 65+) 10/08/2018   Influenza Whole 10/16/2011   Influenza, High Dose Seasonal PF 10/01/2017, 10/07/2019, 09/15/2021   Influenza,inj,Quad PF,6+ Mos 09/21/2015   Influenza-Unspecified 10/22/2013, 10/01/2014, 09/26/2016, 10/28/2020   PFIZER(Purple Top)SARS-COV-2 Vaccination 02/22/2019, 03/19/2019, 01/04/2020, 06/22/2020   Pfizer Covid-19 Vaccine Bivalent Booster 7yrs & up 10/28/2020   Pneumococcal Conjugate-13 08/13/2014   Pneumococcal Polysaccharide-23 08/19/2015   Tdap 08/27/2013   Zoster  Recombinant(Shingrix) 05/29/2016, 08/23/2016   Zoster, Live 01/16/2011    TDAP status: Up to date  Flu Vaccine status: Completed at today's visit  Pneumococcal vaccine status: Up to date  Covid-19 vaccine status: Information provided on how to obtain vaccines.   Qualifies for Shingles Vaccine? Yes   Zostavax completed No   Shingrix Completed?: Yes  Screening Tests Health Maintenance  Topic Date Due   INFLUENZA VACCINE  08/16/2022   COVID-19 Vaccine (6 - 2023-24 season) 09/16/2022   DEXA SCAN  01/16/2028 (Originally 01/04/1995)   DTaP/Tdap/Td (2 - Td or Tdap) 08/28/2023   Medicare Annual Wellness (AWV)  11/09/2023   Pneumonia Vaccine 77+ Years old  Completed   Zoster Vaccines- Shingrix  Completed   HPV VACCINES  Aged Out    Health Maintenance  Health Maintenance Due  Topic Date Due   INFLUENZA VACCINE  08/16/2022   COVID-19 Vaccine (6 - 2023-24 season) 09/16/2022  Colorectal cancer screening: No longer required.   Mammogram status: No longer required due to AGE.  PT Declines bone density  Lung Cancer Screening: (Low Dose CT Chest recommended if Age 32-80 years, 20 pack-year currently smoking OR have quit w/in 15years.) does not qualify.   Lung Cancer Screening Referral: na  Additional Screening:  Hepatitis C Screening: does not qualify; Completed   Vision Screening: Recommended annual ophthalmology exams for early detection of glaucoma and other disorders of the eye. Is the patient up to date with their annual eye exam?  Yes  Who is the provider or what is the name of the office in which the patient attends annual eye exams? [facility] If pt is not established with a provider, would they like to be referred to a provider to establish care? No .   Dental Screening: Recommended annual dental exams for proper oral hygiene   Community Resource Referral / Chronic Care Management: CRR required this visit?  No   CCM required this visit?  No     Plan:     I  have personally reviewed and noted the following in the patient's chart:   Medical and social history Use of alcohol, tobacco or illicit drugs  Current medications and supplements including opioid prescriptions. Patient is not currently taking opioid prescriptions. Functional ability and status Nutritional status Physical activity Advanced directives List of other physicians Hospitalizations, surgeries, and ER visits in previous 12 months Vitals Screenings to include cognitive, depression, and falls Referrals and appointments  In addition, I have reviewed and discussed with patient certain preventive protocols, quality metrics, and best practice recommendations. A written personalized care plan for preventive services as well as general preventive health recommendations were provided to patient.     Sharon Seller, NP   11/09/2022

## 2022-11-09 NOTE — Patient Instructions (Signed)
  Donna [name] , Thank you for taking time to come for your Medicare Wellness Visit. I appreciate your ongoing commitment to your health goals. Please review the following plan we discussed and let me know if I can assist you in the future.   These are the goals we discussed:  Goals      Maintain self care     Patient will maintain self care        This is a list of the screening recommended for you and due dates:  Health Maintenance  Topic Date Due   Flu Shot  08/16/2022   COVID-19 Vaccine (6 - 2023-24 season) 09/16/2022   DEXA scan (bone density measurement)  01/16/2028*   DTaP/Tdap/Td vaccine (2 - Td or Tdap) 08/28/2023   Medicare Annual Wellness Visit  11/09/2023   Pneumonia Vaccine  Completed   Zoster (Shingles) Vaccine  Completed   HPV Vaccine  Aged Out  *Topic was postponed. The date shown is not the original due date.

## 2022-11-23 ENCOUNTER — Ambulatory Visit: Payer: Medicare PPO | Attending: Cardiology | Admitting: Cardiology

## 2022-11-23 ENCOUNTER — Encounter: Payer: Self-pay | Admitting: Cardiology

## 2022-11-23 VITALS — BP 144/68 | HR 65 | Ht 62.0 in | Wt 147.6 lb

## 2022-11-23 DIAGNOSIS — I351 Nonrheumatic aortic (valve) insufficiency: Secondary | ICD-10-CM

## 2022-11-23 DIAGNOSIS — I10 Essential (primary) hypertension: Secondary | ICD-10-CM | POA: Diagnosis not present

## 2022-11-23 DIAGNOSIS — I471 Supraventricular tachycardia, unspecified: Secondary | ICD-10-CM

## 2022-11-23 DIAGNOSIS — I350 Nonrheumatic aortic (valve) stenosis: Secondary | ICD-10-CM

## 2022-11-23 DIAGNOSIS — R0609 Other forms of dyspnea: Secondary | ICD-10-CM | POA: Diagnosis not present

## 2022-11-23 MED ORDER — METOPROLOL SUCCINATE ER 50 MG PO TB24: 50.0000 mg | ORAL_TABLET | Freq: Every day | ORAL | 3 refills | Status: DC

## 2022-11-23 NOTE — Patient Instructions (Addendum)
Medication Instructions:  Your physician has recommended you make the following change in your medication:   INCREASE Metoprolol Succinate (Toprol-XL) to 50 mg once daily    *If you need a refill on your cardiac medications before your next appointment, please call your pharmacy*  Lab Work: None ordered today. If you have labs (blood work) drawn today and your tests are completely normal, you will receive your results only by: MyChart Message (if you have MyChart) OR A paper copy in the mail If you have any lab test that is abnormal or we need to change your treatment, we will call you to review the results.  Testing/Procedures:  Your physician has requested that you have an echocardiogram in June 2025. Echocardiography is a painless test that uses sound waves to create images of your heart. It provides your doctor with information about the size and shape of your heart and how well your heart's chambers and valves are working. This procedure takes approximately one hour. There are no restrictions for this procedure. Please do NOT wear cologne, perfume, aftershave, or lotions (deodorant is allowed). Please arrive 15 minutes prior to your appointment time.  Please note: We ask at that you not bring children with you during ultrasound (echo/ vascular) testing. Due to room size and safety concerns, children are not allowed in the ultrasound rooms during exams. Our front office staff cannot provide observation of children in our lobby area while testing is being conducted. An adult accompanying a patient to their appointment will only be allowed in the ultrasound room at the discretion of the ultrasound technician under special circumstances. We apologize for any inconvenience.   Follow-Up: At [facility], you and your health needs are our priority.  As part of our continuing mission to provide you with exceptional heart care, we have created designated Provider Care Teams.  These Care Teams  include your primary Cardiologist (physician) and Advanced Practice Providers (APPs -  Physician Assistants and Nurse Practitioners) who all work together to provide you with the care you need, when you need it.  We recommend signing up for the patient portal called "MyChart".  Sign up information is provided on this After Visit Summary.  MyChart is used to connect with patients for Virtual Visits (Telemedicine).  Patients are able to view lab/test results, encounter notes, upcoming appointments, etc.  Non-urgent messages can be sent to your provider as well.   To learn more about what you can do with MyChart, go to [email or web address].    Your next appointment:   7-8 month(s) (June-July 2025 AFTER echo)  The format for your next appointment:   In Person  Provider:   Tessa Lerner, DO {

## 2022-11-23 NOTE — Progress Notes (Signed)
Cardiology Office Note:  .   Date:  11/23/2022  ID:  Donna [name], DOB [birth year], MRN [id number] PCP:  Sharon Seller, NP  Former Cardiology Providers: none [facility] Providers Cardiologist:  Tessa Lerner, DO , [facility] (established care 08/26/2019) Electrophysiologist:  None  Click to update primary MD,subspecialty MD or APP then REFRESH:1}    Chief Complaint  Patient presents with   Follow-up    Review echo results    History of Present Illness: .   Donna [name] is a 89 y.o. Caucasian female whose past medical history and cardiovascular risk factors includes:  hypertension, mild OSA REM dependent not on CPAP, supraventricular tachycardia, mixed aortic valve disease, mild MR, migraine, GI bleeding, postmenopausal female, advanced age.   Referred to practice for evaluation and management of palpitations.  She was noted to have PSVT and underwent radiofrequency ablation with EP of the slow pathway and since then her symptoms of palpitations have significantly improved.  She also has underlying mixed aortic valve disease which has been followed clinically as well as with echocardiography.  She presents today for follow-up.  She is accompanied by her daughter in today's office visit.  Since last visit she denies any anginal chest pain or heart failure symptoms.  She does have shortness of breath with over exertional activities.  Recent echocardiogram notes hyperdynamic LVEF and peak gradient with Valsalva is 50 mmHg, grade 2 diastolic dysfunction, and mild to moderate aortic stenosis.  She denies anginal chest pain, heart failure symptoms, near-syncope or syncopal events.  Review of Systems: .   Review of Systems  Cardiovascular:  Positive for dyspnea on exertion (chronic). Negative for chest pain, claudication, irregular heartbeat, leg swelling, near-syncope, orthopnea, palpitations, paroxysmal nocturnal dyspnea and syncope.  Respiratory:  Negative for shortness of breath.    Hematologic/Lymphatic: Negative for bleeding problem.    Studies Reviewed:   EKG: July 06, 2022: Sinus rhythm, 75 bpm, ST-T changes in lateral leads consider ischemia, consider old anterior infarct. No significant change compared to prior tracing 12/15/2021.   Echocardiogram: September 21, 2022: 1. Left ventricular ejection fraction, by estimation, is >75%. The left ventricle has hyperdynamic function. Peak Gradeint at rest (base) and with Valsalva 50 mmHg (base). The left ventricle has no regional wall motion abnormalities. There is moderate left ventricular hypertrophy. Left ventricular diastolic parameters are consistent with Grade II diastolic dysfunction (pseudonormalization). Elevated left atrial pressure. 2. Right ventricular systolic function is normal. The right ventricular size is normal. 3. Left atrial size was moderately dilated. 4. The mitral valve is degenerative. Mild to moderate mitral valve regurgitation. No evidence of mitral stenosis. 5. The aortic valve is tricuspid. Aortic valve regurgitation is mild. Mild to moderate aortic valve stenosis. 6. Rhythm strip during this exam demonstrates normal sinus rhythm  Heart Catheterization: 10/06/2019: LM: Normal LAD: Mild luminal irregularities LCx: Normal RCA: Normal   LVEDP 14 mmHg 60 mmHg mid-cavitary gradient on pullback No significant LV-Ao gradient on pullback  Carotid artery duplex  09/09/2019:  Minimal stenosis in the right internal carotid artery (minimal).  Minimal stenosis in the left internal carotid artery (minimal).  Minimal plaque noted in the CCA bilaterally without significant stenosis.  Antegrade right vertebral artery flow. Antegrade left vertebral artery flow.  Follow up studies if clinically indicated.    RADIOLOGY: NA  Risk Assessment/Calculations:   NA   Labs:       Latest Ref Rng & Units 04/06/2022   10:38 AM 08/21/2021   10:39 AM  02/20/2021   10:38 AM  CBC  WBC 3.8 - 10.8  Thousand/uL 4.4  4.7  5.4   Hemoglobin 11.7 - 15.5 g/dL 96.2  95.2  84.1   Hematocrit 35.0 - 45.0 % 35.9  38.2  38.4   Platelets 140 - 400 Thousand/uL 166  148  172        Latest Ref Rng & Units 03/05/2022   10:57 AM 08/21/2021   10:39 AM 02/28/2021   10:26 AM  BMP  Glucose 65 - 139 mg/dL 86  86  79   BUN 7 - 25 mg/dL 17  18  19    Creatinine 0.60 - 0.95 mg/dL 3.24  4.01  0.27   BUN/Creat Ratio 6 - 22 (calc) SEE NOTE:  SEE NOTE:  NOT APPLICABLE   Sodium 135 - 146 mmol/L 132  132  130   Potassium 3.5 - 5.3 mmol/L 4.9  5.0  4.8   Chloride 98 - 110 mmol/L 99  98  97   CO2 20 - 32 mmol/L 26  28  30    Calcium 8.6 - 10.4 mg/dL 9.8  9.9  9.8       Latest Ref Rng & Units 03/05/2022   10:57 AM 08/21/2021   10:39 AM 02/28/2021   10:26 AM  CMP  Glucose 65 - 139 mg/dL 86  86  79   BUN 7 - 25 mg/dL 17  18  19    Creatinine 0.60 - 0.95 mg/dL 2.53  6.64  4.03   Sodium 135 - 146 mmol/L 132  132  130   Potassium 3.5 - 5.3 mmol/L 4.9  5.0  4.8   Chloride 98 - 110 mmol/L 99  98  97   CO2 20 - 32 mmol/L 26  28  30    Calcium 8.6 - 10.4 mg/dL 9.8  9.9  9.8   Total Protein 6.1 - 8.1 g/dL 5.8  5.9    Total Bilirubin 0.2 - 1.2 mg/dL 0.5  0.6    AST 10 - 35 U/L 13  15    ALT 6 - 29 U/L 12  17      Lab Results  Component Value Date   CHOL 112 12/08/2018   HDL 34 (L) 12/08/2018   LDLCALC 62 12/08/2018   TRIG 80 12/08/2018   CHOLHDL 3.3 12/08/2018   No results for input(s): "LIPOA" in the last 8760 hours. No components found for: "NTPROBNP" No results for input(s): "PROBNP" in the last 8760 hours. Recent Labs    03/05/22 1057  TSH 2.61    Physical Exam:    Today's Vitals   11/23/22 0806  BP: (!) 144/68  Pulse: 65  SpO2: 97%  Weight: 147 lb 9.6 oz (67 kg)  Height: 5\' 2"  (1.575 m)   Body mass index is 27 kg/m. Wt Readings from Last 3 Encounters:  11/23/22 147 lb 9.6 oz (67 kg)  11/09/22 148 lb (67.1 kg)  07/06/22 146 lb (66.2 kg)    Physical Exam  Constitutional: No distress.   Age appropriate, hemodynamically stable.   Neck: No JVD present.  Cardiovascular: Normal rate, regular rhythm, S1 normal, S2 normal and intact distal pulses. Exam reveals no gallop, no S3 and no S4.  Murmur heard. Harsh midsystolic murmur is present with a grade of 3/6 at the upper right sternal border radiating to the neck. Pulses:      Carotid pulses are 1+ on the right side and 1+ on the left side. Pulmonary/Chest: Effort normal and  breath sounds normal. No stridor. She has no wheezes. She has no rales.  Abdominal: Soft. Bowel sounds are normal. She exhibits no distension. There is no abdominal tenderness.  Musculoskeletal:        General: No edema.     Cervical back: Neck supple.  Neurological: She is alert and oriented to person, place, and time. She has intact cranial nerves (2-12).  Skin: Skin is warm and moist.   Impression & Recommendation(s):  Impression:   ICD-10-CM   1. Nonrheumatic aortic valve stenosis  I35.0 ECHOCARDIOGRAM COMPLETE    2. Nonrheumatic aortic valve insufficiency  I35.1     3. Essential hypertension, benign  I10 metoprolol succinate (TOPROL-XL) 50 MG 24 hr tablet    4. Paroxysmal SVT (supraventricular tachycardia) (HCC)  I47.10     5. Dyspnea on exertion  R06.09        Recommendation(s):  Nonrheumatic aortic valve stenosis Nonrheumatic aortic valve insufficiency Dyspnea on exertion Known history of mixed aortic valve disease Clinically denies angina pectoris, heart failure symptoms, syncope. Recent echocardiogram illustrates hyperdynamic LVEF with a peak LVOT gradient with Valsalva and 50 mmHg.  The severity of aortic stenosis remains stable. Currently on Toprol-XL 25 mg p.o. daily. Will increase Toprol-XL to 50 mg p.o. daily to help improve diastology and dynamic LVOT gradient. Patient is asked to seek medical attention if she has a syncopal event. Reemphasized importance of checking her blood pressures at home-May consider afterload reducing  agents as well. Will repeat echocardiogram in 6 months to reevaluate LVOT gradient  Essential hypertension, benign Office blood pressures are acceptable. Home blood pressures are also within acceptable limits according to the patient. No blood pressure log available for review. Of note, amlodipine was discontinued in the past due to lower extremity swelling  Paroxysmal SVT (supraventricular tachycardia) (HCC) Has remained asymptomatic status post radiofrequency ablation of her slow pathway with Dr. Ladona Ridgel Monitor for now  Orders Placed:  Orders Placed This Encounter  Procedures   ECHOCARDIOGRAM COMPLETE    Standing Status:   Future    Standing Expiration Date:   11/23/2023    Order Specific Question:   Where should this test be performed    Answer:   [facility])    Order Specific Question:   Does the patient weigh less than or greater than 250 lbs?    Answer:   Patient weighs less than 250 lbs    Order Specific Question:   Perflutren DEFINITY (image enhancing agent) should be administered unless hypersensitivity or allergy exist    Answer:   Administer Perflutren    Order Specific Question:   Reason for exam-Echo    Answer:   Aortic stenosis I35.0   Final Medication List:    Meds ordered this encounter  Medications   metoprolol succinate (TOPROL-XL) 50 MG 24 hr tablet    Sig: Take 1 tablet (50 mg total) by mouth daily. Take with or immediately following a meal.    Dispense:  90 tablet    Refill:  3    Increase dose to 50mg     Medications Discontinued During This Encounter  Medication Reason   Calcium Carbonate-Vit D-Min (CALTRATE 600+D PLUS MINERALS) 600-800 MG-UNIT CHEW Duplicate   metoprolol succinate (TOPROL-XL) 25 MG 24 hr tablet Dose change     Current Outpatient Medications:    Blood Pressure Monitoring KIT, Essential hypertension check bp daily, Disp: 1 kit, Rfl: 0   Calcium Carbonate-Vitamin D 600-400 MG-UNIT per tablet, Take 1 tablet  by  mouth. At lunch and at bedtime, Disp: , Rfl:    diclofenac Sodium (VOLTAREN) 1 % GEL, Apply 4 g topically 4 (four) times daily as needed., Disp: 150 g, Rfl: 3   hydrALAZINE (APRESOLINE) 50 MG tablet, TAKE 1 TABLET(50 MG) BY MOUTH THREE TIMES DAILY, Disp: 270 tablet, Rfl: 1   levothyroxine (SYNTHROID) 100 MCG tablet, TAKE 1 TABLET BY MOUTH EVERY DAY 30 MINUTES BEFORE BREAKFAST ON AN EMPTY STOMACH, Disp: 90 tablet, Rfl: 3   Light Mineral Oil-Mineral Oil (RETAINE MGD) 0.5-0.5 % EMUL, Place 1 drop into both eyes in the morning and at bedtime., Disp: , Rfl:    losartan (COZAAR) 100 MG tablet, TAKE 1 TABLET(100 MG) BY MOUTH EVERY MORNING, Disp: 90 tablet, Rfl: 1   metoprolol succinate (TOPROL-XL) 50 MG 24 hr tablet, Take 1 tablet (50 mg total) by mouth daily. Take with or immediately following a meal., Disp: 90 tablet, Rfl: 3   Multiple Vitamins-Minerals (PRESERVISION AREDS 2) CAPS, Take 1 capsule by mouth 2 (two) times a day. , Disp: , Rfl:    OVER THE COUNTER MEDICATION, Take 2 capsules by mouth daily. MitoQ, Disp: , Rfl:    vitamin B-12 (CYANOCOBALAMIN) 1000 MCG tablet, Take 1 tablet (1,000 mcg total) by mouth daily., Disp: 30 tablet, Rfl: 5  Consent:   N/A  Disposition:   July 2025, sooner if needed.  Reevaluate symptoms & review echo results.   Patient may be asked to follow-up sooner based on the results of the above-mentioned testing.  Her questions and concerns were addressed to her satisfaction. She voices understanding of the recommendations provided during this encounter.    Signed, Tessa Lerner, DO, [facility]  [facility]  [address] [place], [place] [address] 11/23/2022 11:08 AM

## 2022-12-11 ENCOUNTER — Other Ambulatory Visit: Payer: Self-pay | Admitting: Cardiology

## 2022-12-11 DIAGNOSIS — I10 Essential (primary) hypertension: Secondary | ICD-10-CM

## 2022-12-28 ENCOUNTER — Ambulatory Visit: Payer: Medicare PPO | Admitting: Nurse Practitioner

## 2022-12-28 ENCOUNTER — Encounter: Payer: Self-pay | Admitting: Nurse Practitioner

## 2022-12-28 VITALS — BP 138/68 | HR 64 | Temp 97.1°F | Ht 62.0 in | Wt 148.0 lb

## 2022-12-28 DIAGNOSIS — M7062 Trochanteric bursitis, left hip: Secondary | ICD-10-CM | POA: Diagnosis not present

## 2022-12-28 DIAGNOSIS — I10 Essential (primary) hypertension: Secondary | ICD-10-CM

## 2022-12-28 DIAGNOSIS — I080 Rheumatic disorders of both mitral and aortic valves: Secondary | ICD-10-CM | POA: Diagnosis not present

## 2022-12-28 DIAGNOSIS — M7061 Trochanteric bursitis, right hip: Secondary | ICD-10-CM | POA: Diagnosis not present

## 2022-12-28 DIAGNOSIS — I471 Supraventricular tachycardia, unspecified: Secondary | ICD-10-CM | POA: Diagnosis not present

## 2022-12-28 DIAGNOSIS — M159 Polyosteoarthritis, unspecified: Secondary | ICD-10-CM | POA: Diagnosis not present

## 2022-12-28 DIAGNOSIS — K5904 Chronic idiopathic constipation: Secondary | ICD-10-CM

## 2022-12-28 DIAGNOSIS — E039 Hypothyroidism, unspecified: Secondary | ICD-10-CM | POA: Diagnosis not present

## 2022-12-28 DIAGNOSIS — E871 Hypo-osmolality and hyponatremia: Secondary | ICD-10-CM | POA: Diagnosis not present

## 2022-12-28 NOTE — Patient Instructions (Signed)
To let me know about the sports medicine referral.   Let me know about outpatient or home health therapy

## 2022-12-28 NOTE — Progress Notes (Signed)
Careteam: Patient Care Team: Sharon Seller, NP as PCP - General (Geriatric Medicine) Tessa Lerner, DO as PCP - Cardiology (Cardiology) Everardo Pacific, OD as Consulting Physician (Optometry) Julius Bowels Marletta Lor., MD as Referring Physician (Orthopedic Surgery) Tessa Lerner, DO as Referring Physician (Cardiology)  PLACE OF SERVICE:  [facility]  Advanced Directive information Does Patient Have a Medical Advance Directive?: Yes, Type of Advance Directive: Healthcare Power of [place];Out of facility DNR (pink MOST or yellow form), Pre-existing out of facility DNR order (yellow form or pink MOST form): Yellow form placed in chart (order not valid for inpatient use), Does patient want to make changes to medical advance directive?: No - Patient declined  No Known Allergies  Chief Complaint  Patient presents with   Medical Management of Chronic Issues    10 month follow up.      HPI: Patient is a 89 y.o. female for routine follow up  Htn- took medication when she woke up. Reports blood pressure is usually in the 140s. This morning when she woke up sbp was in the 160s  She saw the cardiologist in November and toprol was increased to 50 mg daily and taking hydralazine 50 mg three times daily  She also is in a lot of pain in her hips and has a hard time winding down. She gets up in the middle of the night and will take tylenol- does not always help.  Uses heat  If she gets up to walk around that will relieve the pain.  She is home bound.   Review of Systems:  Review of Systems  Constitutional:  Negative for chills, fever and weight loss.  HENT:  Negative for tinnitus.   Respiratory:  Negative for cough, sputum production and shortness of breath.   Cardiovascular:  Negative for chest pain, palpitations and leg swelling.  Gastrointestinal:  Negative for abdominal pain, constipation, diarrhea and heartburn.  Genitourinary:  Negative for dysuria, frequency and urgency.   Musculoskeletal:  Positive for back pain and joint pain. Negative for falls and myalgias.  Skin: Negative.   Neurological:  Negative for dizziness and headaches.  Psychiatric/Behavioral:  Negative for depression and memory loss. The patient does not have insomnia.     Past Medical History:  Diagnosis Date   Aortic stenosis    Hemorrhage of gastrointestinal tract, unspecified    Hypertension    Migraine    Mitral regurgitation    OA (osteoarthritis)    Thyroid cyst    Past Surgical History:  Procedure Laterality Date   CATARACT EXTRACTION  1990   rt   CATARACT EXTRACTION  2005   left   LEFT HEART CATH AND CORONARY ANGIOGRAPHY N/A 10/06/2019   Procedure: LEFT HEART CATH AND CORONARY ANGIOGRAPHY;  Surgeon: Elder Negus, MD;  Location: [facility] INVASIVE CV [facility];  Service: Cardiovascular;  Laterality: N/A;   REVISION TOTAL KNEE ARTHROPLASTY  2008   SVT ABLATION N/A 01/18/2020   Procedure: SVT ABLATION;  Surgeon: Marinus Maw, MD;  Location: [facility] CV [facility];  Service: Cardiovascular;  Laterality: N/A;   THUMB ARTHROSCOPY     TONSILLECTOMY     Social History:   reports that she quit smoking about 75 years ago. Her smoking use included cigarettes. She started smoking about 77 years ago. She has never used smokeless tobacco. She reports that she does not currently use alcohol. She reports that she does not use drugs.  Family History  Problem Relation Age of Onset   CVA  Father 98   Pancreatic cancer Mother    CVA Sister 49   Brain cancer Brother    Arthritis Sister    Heart disease Sister    Breast cancer Neg Hx     Medications: Patient's Medications  New Prescriptions   No medications on file  Previous Medications   BLOOD PRESSURE MONITORING KIT    Essential hypertension check bp daily   CALCIUM CARBONATE-VITAMIN D 600-400 MG-UNIT PER TABLET    Take 1 tablet by mouth. At lunch and at bedtime   DICLOFENAC SODIUM (VOLTAREN) 1 % GEL    Apply 4 g topically 4 (four) times  daily as needed.   HYDRALAZINE (APRESOLINE) 50 MG TABLET    TAKE 1 TABLET(50 MG) BY MOUTH THREE TIMES DAILY   LEVOTHYROXINE (SYNTHROID) 100 MCG TABLET    TAKE 1 TABLET BY MOUTH EVERY DAY 30 MINUTES BEFORE BREAKFAST ON AN EMPTY STOMACH   LIGHT MINERAL OIL-MINERAL OIL (RETAINE MGD) 0.5-0.5 % EMUL    Place 1 drop into both eyes in the morning and at bedtime.   LOSARTAN (COZAAR) 100 MG TABLET    TAKE 1 TABLET(100 MG) BY MOUTH EVERY MORNING   METOPROLOL SUCCINATE (TOPROL-XL) 100 MG 24 HR TABLET    Take 100 mg by mouth daily. Take with or immediately following a meal.   METOPROLOL SUCCINATE (TOPROL-XL) 50 MG 24 HR TABLET    Take 1 tablet (50 mg total) by mouth daily. Take with or immediately following a meal.   MULTIPLE VITAMINS-MINERALS (PRESERVISION AREDS 2) CAPS    Take 1 capsule by mouth 2 (two) times a day.    OVER THE COUNTER MEDICATION    Take 2 capsules by mouth daily. MitoQ   VITAMIN B-12 (CYANOCOBALAMIN) 1000 MCG TABLET    Take 1 tablet (1,000 mcg total) by mouth daily.  Modified Medications   No medications on file  Discontinued Medications   No medications on file    Physical Exam:  Vitals:   12/28/22 0938 12/28/22 1100  BP: (!) 152/70 (!) 148/70  Pulse: 64   Temp: (!) 97.1 F (36.2 C)   TempSrc: Temporal   SpO2: 97%   Weight: 148 lb (67.1 kg)   Height: 5\' 2"  (1.575 m)    Body mass index is 27.07 kg/m. Wt Readings from Last 3 Encounters:  12/28/22 148 lb (67.1 kg)  11/23/22 147 lb 9.6 oz (67 kg)  11/09/22 148 lb (67.1 kg)    Physical Exam Constitutional:      General: She is not in acute distress.    Appearance: She is well-developed. She is not diaphoretic.  HENT:     Head: Normocephalic and atraumatic.     Mouth/Throat:     Pharynx: No oropharyngeal exudate.  Eyes:     Conjunctiva/sclera: Conjunctivae normal.     Pupils: Pupils are equal, round, and reactive to light.  Cardiovascular:     Rate and Rhythm: Normal rate and regular rhythm.     Heart sounds:  Normal heart sounds.  Pulmonary:     Effort: Pulmonary effort is normal.     Breath sounds: Normal breath sounds.  Abdominal:     General: Bowel sounds are normal.     Palpations: Abdomen is soft.  Musculoskeletal:     Cervical back: Normal range of motion and neck supple.     Right hip: Decreased range of motion.     Left hip: Decreased range of motion.     Right lower leg: No edema.  Left lower leg: No edema.       Legs:     Comments: Tenderness over bilateral bursa, Right<left  Decrease in ROM  Skin:    General: Skin is warm and dry.  Neurological:     Mental Status: She is alert.  Psychiatric:        Mood and Affect: Mood normal.     Labs reviewed: Basic Metabolic Panel: Recent Labs    03/05/22 1057  NA 132*  K 4.9  CL 99  CO2 26  GLUCOSE 86  BUN 17  CREATININE 0.75  CALCIUM 9.8  TSH 2.61   Liver Function Tests: Recent Labs    03/05/22 1057  AST 13  ALT 12  BILITOT 0.5  PROT 5.8*   No results for input(s): "LIPASE", "AMYLASE" in the last 8760 hours. No results for input(s): "AMMONIA" in the last 8760 hours. CBC: Recent Labs    04/06/22 1038  WBC 4.4  NEUTROABS 2,490  HGB 11.8  HCT 35.9  MCV 88.9  PLT 166   Lipid Panel: No results for input(s): "CHOL", "HDL", "LDLCALC", "TRIG", "CHOLHDL", "LDLDIRECT" in the last 8760 hours. TSH: Recent Labs    03/05/22 1057  TSH 2.61   A1C: Lab Results  Component Value Date   HGBA1C 5.1 08/22/2016     Assessment/Plan  1. Essential hypertension, benign (Primary) -Blood pressure controlled, goal bp <140/90 and improved to 138/68 with recheck  Pain and lack of sleep likely contributing  Continue current medications and dietary modifications follow metabolic panel - COMPLETE METABOLIC PANEL WITH GFR - CBC with Differential/Platelet  2. Hypothyroidism, unspecified type Continues on synthroid 100 mcg - TSH  3. Chronic idiopathic constipation -controlled   4. Hyponatremia Will follow up  labs  5. SVT (supraventricular tachycardia) (HCC) Stable, continues on metoprolol 100 mg daily  6. Generalized osteoarthritis of multiple sites Ongoing, continues on tylenol PRN Recommending PT at this time  7. Mitral regurgitation and aortic stenosis Ongoing, symptoms stable. Followed by cardiology   8. Trochanteric bursitis of both hips Recommended PT, will discuss with daughter if she wants to go to PT or have home health as she can not drive.  Also discussed referral to sports medicine/orthopedic for injection due to pain. Daughter would like to do some research on who she wants to referral sent to.  / Return in about 6 months (around 06/28/2023) for routine follow up .  Janene Harvey. Biagio Borg [facility] [phone]

## 2022-12-29 LAB — COMPLETE METABOLIC PANEL WITH GFR
AG Ratio: 2.8 (calc) — ABNORMAL HIGH (ref 1.0–2.5)
ALT: 15 U/L (ref 6–29)
AST: 15 U/L (ref 10–35)
Albumin: 4.2 g/dL (ref 3.6–5.1)
Alkaline phosphatase (APISO): 42 U/L (ref 37–153)
BUN: 16 mg/dL (ref 7–25)
CO2: 28 mmol/L (ref 20–32)
Calcium: 9.9 mg/dL (ref 8.6–10.4)
Chloride: 104 mmol/L (ref 98–110)
Creat: 0.8 mg/dL (ref 0.60–0.95)
Globulin: 1.5 g/dL — ABNORMAL LOW (ref 1.9–3.7)
Glucose, Bld: 86 mg/dL (ref 65–139)
Potassium: 4.8 mmol/L (ref 3.5–5.3)
Sodium: 136 mmol/L (ref 135–146)
Total Bilirubin: 0.4 mg/dL (ref 0.2–1.2)
Total Protein: 5.7 g/dL — ABNORMAL LOW (ref 6.1–8.1)
eGFR: 69 mL/min/{1.73_m2} (ref >=60)

## 2022-12-29 LAB — CBC WITH DIFFERENTIAL/PLATELET
Absolute Lymphocytes: 1201 {cells}/uL (ref 850–3900)
Absolute Monocytes: 750 {cells}/uL (ref 200–950)
Basophils Absolute: 21 {cells}/uL (ref 0–200)
Basophils Relative: 0.5 %
Eosinophils Absolute: 41 {cells}/uL (ref 15–500)
Eosinophils Relative: 1 %
HCT: 37 % (ref 35.0–45.0)
Hemoglobin: 12.3 g/dL (ref 11.7–15.5)
MCH: 30.4 pg (ref 27.0–33.0)
MCHC: 33.2 g/dL (ref 32.0–36.0)
MCV: 91.6 fL (ref 80.0–100.0)
MPV: 10.5 fL (ref 7.5–12.5)
Monocytes Relative: 18.3 %
Neutro Abs: 2087 {cells}/uL (ref 1500–7800)
Neutrophils Relative %: 50.9 %
Platelets: 133 10*3/uL — ABNORMAL LOW (ref 140–400)
RBC: 4.04 10*6/uL (ref 3.80–5.10)
RDW: 12.1 % (ref 11.0–15.0)
Total Lymphocyte: 29.3 %
WBC: 4.1 10*3/uL (ref 3.8–10.8)

## 2022-12-29 LAB — TSH: TSH: 2.66 m[IU]/L (ref 0.40–4.50)

## 2022-12-31 ENCOUNTER — Telehealth: Payer: Self-pay | Admitting: Nurse Practitioner

## 2022-12-31 DIAGNOSIS — M7061 Trochanteric bursitis, right hip: Secondary | ICD-10-CM

## 2022-12-31 NOTE — Telephone Encounter (Signed)
Patient's daughter, Amy, called to say they have decided that they do want a referral to an orthopedist who can do injections for the bilateral hip pain. She said this was discussed at last week's visit.

## 2023-01-01 NOTE — Telephone Encounter (Signed)
Referral has been placed. 

## 2023-01-25 ENCOUNTER — Other Ambulatory Visit: Payer: Self-pay

## 2023-01-25 ENCOUNTER — Ambulatory Visit: Payer: Medicare PPO | Admitting: Sports Medicine

## 2023-01-25 ENCOUNTER — Other Ambulatory Visit (INDEPENDENT_AMBULATORY_CARE_PROVIDER_SITE_OTHER): Payer: Self-pay

## 2023-01-25 ENCOUNTER — Encounter: Payer: Self-pay | Admitting: Sports Medicine

## 2023-01-25 DIAGNOSIS — G8929 Other chronic pain: Secondary | ICD-10-CM

## 2023-01-25 DIAGNOSIS — M160 Bilateral primary osteoarthritis of hip: Secondary | ICD-10-CM

## 2023-01-25 DIAGNOSIS — M7061 Trochanteric bursitis, right hip: Secondary | ICD-10-CM | POA: Diagnosis not present

## 2023-01-25 DIAGNOSIS — M67951 Unspecified disorder of synovium and tendon, right thigh: Secondary | ICD-10-CM | POA: Diagnosis not present

## 2023-01-25 DIAGNOSIS — M25551 Pain in right hip: Secondary | ICD-10-CM | POA: Diagnosis not present

## 2023-01-25 MED ORDER — METHYLPREDNISOLONE ACETATE 40 MG/ML IJ SUSP
40.0000 mg | INTRAMUSCULAR | Status: AC | PRN
  Administered 2023-01-25: 40 mg via INTRA_ARTICULAR

## 2023-01-25 MED ORDER — LIDOCAINE HCL 1 % IJ SOLN
4.0000 mL | INTRAMUSCULAR | Status: AC | PRN
  Administered 2023-01-25: 4 mL

## 2023-01-25 NOTE — Progress Notes (Signed)
 Patient says that she has had right hip pain for years now that has gotten worse in the last year or two. She says that she has dull and aching pain on the side of the hip, but recently has also had sharp pain deeper in the front of the hip/groin. She says that she feels unstable and is having difficulty sleeping due to pain. She takes Tylenol  as needed for pain. She says that when she is having trouble sleeping she uses heat and that helps a bit.

## 2023-01-25 NOTE — Progress Notes (Signed)
 Donna [name] - 89 y.o. female MRN [id number]  Date of birth: 1927/01/18  Office Visit Note: Visit Date: 01/25/2023 PCP: [name], NP Referred by: [name], NP  Subjective: Chief Complaint  Patient presents with   Right Hip - Pain   HPI: Donna [name] is a very pleasant 89 y.o. female who presents today for chronic right lateral hip pain.  She does present with her daughter who does help provide some of HPI, she is a physical therapist with [employer].  Donna [name] has had chronic right hip pain for years.  Initially this was over the lateral aspect of the hip but here more recently she has been having more pain over the anterior hip which radiates into the groin.  The pain has been exacerbated that interferes with activities of daily living and making sleep difficult.  This does make her gait feel slightly unsteady although she has not fallen.  She does not take any prescription medication for her pain.  She did have an MRI back in 2023 with both hip arthritis and partial gluteus tearing/bursitis, see results below.  Pertinent ROS were reviewed with the patient and found to be negative unless otherwise specified above in HPI.   Assessment & Plan: Visit Diagnoses:  1. Chronic right hip pain   2. Bilateral primary osteoarthritis of hip   3. Greater trochanteric bursitis, right   4. Tendinopathy of right gluteus medius    Plan: Impression is acute exacerbation of chronic underlying right hip pain.  I believe her pain is twofold in nature - she does have moderate hip arthritis with MRI confirmed high-grade cartilage loss of both hips.  However she also has a degree of trochanteric bursitis with partial tearing of her gluteus medius tendon with tendinopathy.  Both are affecting her hip, but what seems most symptomatic for her today is the intra-articular hip.  Through shared decision making, we did proceed with ultrasound-guided right intra-articular hip injection.  About 5 minutes  after this procedure she was able to move the hip and reported relief of all of her anterior hip/groin pain.  She may use ice/heat or Tylenol  for any postinjection pain.  I would like to see her back in about 2 weeks for reevaluation of the right hip.  At that visit, we will see her relief postinjection and move the hip through range of motion as well as testing her lateral hip abductors.  We discussed at that time getting her into a home therapy program or formal PT for the hip depending.  Could consider greater trochanteric injection but will await follow-up and reevaluation.  Follow-up: Return in about 2 weeks (around 02/08/2023) for For R-hip f/u .   Meds & Orders: No orders of the defined types were placed in this encounter.   Orders Placed This Encounter  Procedures   Large Joint Inj   XR HIP UNILAT W OR W/O PELVIS 2-3 VIEWS RIGHT   US  Guided Needle Placement - No Linked Charges     Procedures: Large Joint Inj: R hip joint on 01/25/2023 12:39 PM Indications: pain Details: 22 G 3.5 in needle, ultrasound-guided anterior approach Medications: 4 mL lidocaine  1 %; 40 mg methylPREDNISolone  acetate 40 MG/ML Outcome: tolerated well, no immediate complications  Procedure: US -guided intra-articular hip injection, Right After discussion on risks/benefits/indications and informed verbal consent was obtained, a timeout was performed. Patient was lying supine on exam table. The hip was cleaned with betadine and alcohol swabs. Then utilizing ultrasound guidance,  the patient's femoral head and neck junction was identified and subsequently injected with 4:1 lidocaine :depomedrol via an in-plane approach with ultrasound visualization of the injectate administered into the hip joint. Patient tolerated procedure well without immediate complications.  Procedure, treatment alternatives, risks and benefits explained, specific risks discussed. Consent was given by the patient. Immediately prior to procedure a time  out was called to verify the correct patient, procedure, equipment, support staff and site/side marked as required. Patient was prepped and draped in the usual sterile fashion.          Clinical History: No specialty comments available.  She reports that she quit smoking about 75 years ago. Her smoking use included cigarettes. She started smoking about 77 years ago. She has never used smokeless tobacco. No results for input(s): HGBA1C, LABURIC in the last 8760 hours.  Objective:    Physical Exam  Gen: Well-appearing, in no acute distress; non-toxic CV: Well-perfused. Warm.  Resp: Breathing unlabored on room air; no wheezing. Psych: Fluid speech in conversation; appropriate affect; normal thought process  Ortho Exam - Bilateral hip: Positive TTP over the right greater trochanter, equivocal on the left.  There is mild restriction of the right hip with internal and external logroll but without gross bony restriction.  There is positive Stinchfield, positive FADIR testing of the right hip.  When attempting to check hip abduction of the hips she had vast exacerbation of the intra-articular hip so this was deferred further.  No effusion of the hip.  Imaging: XR HIP UNILAT W OR W/O PELVIS 2-3 VIEWS RIGHT Result Date: 01/25/2023 3 views of the hip including bilateral AP, left hip AP and lateral film were ordered and reviewed by myself.  X-rays show moderate osteoarthritic change of bilateral hips.  There is a small degree of spurring off the greater trochanter on the right hip.  No acute fracture or otherwise bony abnormality noted.  *Independent review and interpretation of the right hip and pelvis MRI from 05/08/2021 was performed today.  MRI demonstrates evidence of high-grade cartilage loss with at least moderate arthritic change of bilateral hips.  The right hip has a degree of trochanteric bursitis, left hip trace trochanteric bursitis.  The right hip has high-grade partial tearing of the  gluteus medius insertion and to a lesser degree gluteus minimus tendons with tendinopathy.  Narrative & Impression  CLINICAL DATA:  Right hip pain starting 2 weeks ago while walking. Questionable lucent lesion in the right femoral neck on radiography.   EXAM: MRI PELVIS WITHOUT CONTRAST   TECHNIQUE: Multiplanar multisequence MR imaging of the pelvis was performed. No intravenous contrast was administered.   COMPARISON:  None.   Radiographs 04/26/2021   BONES AND JOINTS: BONES AND JOINTS Moderate degenerative arthropathy both hips with loss of articular space. Scattered degenerative subcortical cystic lesions are present along the left upper acetabulum. Pubic symphysis and visualized portions of the SI joints unremarkable. There no lesion in the right femoral neck to correspond with the questionable lucency on radiography.   There is evidence of lumbar spondylosis and degenerative disc disease at L4-5 and L5-S1. Grade 1 degenerative anterolisthesis at L4-5 and L5-S1.   Bursae:   Trace bilateral trochanteric bursitis.   Muscles and tendons   Distal gluteus medius and gluteus minimus tendinopathy and peritendinitis on the right. This is shown for example on image 17 series 6. A small amount of edema tracks in the gluteus medius muscle.   Other findings   Sigmoid colon diverticulosis. Trace free pelvic  fluid on image 24 series 7. Mildly retroverted uterus.   IMPRESSION: 1. Distal right gluteus medius and gluteus minimus tendinopathy and peritendinitis. Adjacent mild trochanteric bursitis. 2. Trace left trochanteric bursitis is also present. 3. Moderate degenerative arthropathy of both hips, left greater than right. 4. No lesion in the right femoral neck to correspond with the questionable lucency on radiography. 5. Lumbar spondylosis and degenerative disc disease at L4-5 and L5-S1 with grade 1 anterolisthesis at both levels. 6. Sigmoid colon diverticulosis. 7.  Trace free pelvic fluid.     Electronically Signed   By: Ryan Salvage M.D.   On: 05/08/2021 17:46    Narrative & Impression  CLINICAL DATA:  Possible right hip lucent lesion, anterolateral right hip pain   EXAM: MRI OF THE RIGHT HIP WITHOUT AND WITH CONTRAST   TECHNIQUE: Multiplanar, multisequence MR imaging was performed both before and after administration of intravenous contrast.   CONTRAST:  15mL MULTIHANCE  GADOBENATE DIMEGLUMINE  529 MG/ML IV SOLN   COMPARISON:  MRI pelvis 05/09/2011 and hip radiographs 04/26/2021   FINDINGS: Bones: Moderate degenerative hip arthropathy bilaterally. Degenerative subcortical cysts in the left acetabulum. There is evidence of lumbar spondylosis and degenerative disc disease particularly at L4-5. No significant lesion noted in the right femoral neck to correspond with the questionable radiographic finding.   Articular cartilage and labrum   Articular cartilage:  Moderate thinning bilaterally.   Labrum: Blunted or truncated appearance of the right anterior labrum making it difficult to exclude a chronic tear.   Joint or bursal effusion   Joint effusion:  Absent   Bursae: Mild bilateral trochanteric bursitis.   Muscles and tendons   Muscles and tendons: Distal right gluteus medius and gluteus minimus peritendinitis and tendinopathy. Partial tearing of the right gluteus medius tendon is suspected. There is also potentially partial tearing of the left gluteus medius tendon.   Other findings   Miscellaneous:   Trace free pelvic fluid.   IMPRESSION: 1. Moderate degenerative hip arthropathy bilaterally, with mild bilateral trochanteric bursitis and right greater than left distal gluteus medius and minimus peritendinitis and tendinopathy. Potential partial tearing of the gluteus medius tendons bilaterally. 2. Blunted are truncated appearance of the right anterior labrum making it difficult to exclude a chronic tear. 3.  Trace free pelvic fluid, etiology uncertain.     Electronically Signed   By: Ryan Salvage M.D.   On: 05/08/2021 19:06    Past Medical/Family/Surgical/Social History: Medications & Allergies reviewed per EMR, new medications updated. Patient Active Problem List   Diagnosis Date Noted   Nonrheumatic aortic valve stenosis 10/08/2022   Dysesthesia affecting both sides of body 01/19/2021   Mild obstructive sleep apnea 10/23/2020   Excessive daytime sleepiness 07/25/2020   RLS (restless legs syndrome) 07/25/2020   Intermittent palpitations 07/25/2020   Nonrheumatic aortic valve insufficiency    Bradycardia 11/13/2019   Mitral regurgitation and aortic stenosis 11/12/2019   Severe mitral regurgitation 11/12/2019   SVT (supraventricular tachycardia) (HCC) 11/12/2019   Exertional dyspnea 10/02/2019   Overweight (BMI 25.0-29.9) 10/14/2017   BMI 27.0-27.9,adult 10/14/2017   Balance problem 10/14/2017   Neuropathic pain 09/03/2016   Bilateral nonexudative age-related macular degeneration 09/03/2016   Generalized osteoarthritis of multiple sites 05/07/2013   Hypothyroidism 05/07/2013   Cyst of thyroid  05/07/2013   Hyperhidrosis 05/07/2013   Essential hypertension, benign 05/07/2013   Cough 01/31/2012   Past Medical History:  Diagnosis Date   Aortic stenosis    Hemorrhage of gastrointestinal tract, unspecified    Hypertension  Migraine    Mitral regurgitation    OA (osteoarthritis)    Thyroid  cyst    Family History  Problem Relation Age of Onset   CVA Father 59   Pancreatic cancer Mother    CVA Sister 72   Brain cancer Brother    Arthritis Sister    Heart disease Sister    Breast cancer Neg Hx    Past Surgical History:  Procedure Laterality Date   CATARACT EXTRACTION  1990   rt   CATARACT EXTRACTION  2005   left   LEFT HEART CATH AND CORONARY ANGIOGRAPHY N/A 10/06/2019   Procedure: LEFT HEART CATH AND CORONARY ANGIOGRAPHY;  Surgeon: Elmira Newman PARAS, MD;   Location: [facility];  Service: Cardiovascular;  Laterality: N/A;   REVISION TOTAL KNEE ARTHROPLASTY  2008   SVT ABLATION N/A 01/18/2020   Procedure: SVT ABLATION;  Surgeon: Waddell Danelle ORN, MD;  Location: [facility];  Service: Cardiovascular;  Laterality: N/A;   THUMB ARTHROSCOPY     TONSILLECTOMY     Social History   Occupational History   Not on file  Tobacco Use   Smoking status: Former    Current packs/day: 0.00    Types: Cigarettes    Start date: 01/15/1946    Quit date: 01/16/1948    Years since quitting: 75.0   Smokeless tobacco: Never   Tobacco comments:    smoked on occ when she was a teen  Vaping Use   Vaping status: Never Used  Substance and Sexual Activity   Alcohol use: Not Currently   Drug use: No   Sexual activity: Not on file

## 2023-02-08 ENCOUNTER — Ambulatory Visit: Payer: Medicare PPO | Admitting: Sports Medicine

## 2023-02-08 ENCOUNTER — Encounter: Payer: Self-pay | Admitting: Sports Medicine

## 2023-02-08 DIAGNOSIS — G8929 Other chronic pain: Secondary | ICD-10-CM | POA: Diagnosis not present

## 2023-02-08 DIAGNOSIS — M25551 Pain in right hip: Secondary | ICD-10-CM | POA: Diagnosis not present

## 2023-02-08 DIAGNOSIS — M1611 Unilateral primary osteoarthritis, right hip: Secondary | ICD-10-CM | POA: Diagnosis not present

## 2023-02-08 NOTE — Progress Notes (Signed)
Donna [name] - 89 y.o. female MRN [id number]  Date of birth: [birth year]  Office Visit Note: Visit Date: 02/08/2023 PCP: Donna Seller, NP Referred by: Donna Seller, NP  Subjective: Chief Complaint  Patient presents with   Right Hip - Follow-up   HPI: Donna [name] is a pleasant 89 y.o. female who presents today for follow-up of right hip pain.  She does present with her daughter who does help provide some of HPI, she is a physical therapist with [employer].   Donna [name] has been having right hip groin and lateral hip pain.  We did perform ultrasound-guided intra-articular hip injection on 01/25/2023.  She states that almost immediately she noticed significant improvement in her pain.  She is no longer having any hip pain currently.  The pain on the side of her hip is improved.  She is very pleased.  Her daughter notes that she is able to get in and out of the car much better now.  She would rather not do formal PT but is agreeable to home exercise plan.  Pertinent ROS were reviewed with the patient and found to be negative unless otherwise specified above in HPI.   Assessment & Plan: Visit Diagnoses:  1. Chronic right hip pain   2. Unilateral primary osteoarthritis, right hip    Plan: Impression is resolved right hip pain after intra-articular injection, which points to her arthritis/intra-articular hip in the pathology of her pain.  She does have some weakness with her lateral hip abductors and surrounding hip musculature, I think she would benefit from a degree of therapy.  She prefers home versus formalized physical therapy.  We did print out a handout for hip strengthening and stabilization exercises for her to perform only twice weekly.  Her daughter is a physical therapist at [facility] [place] so she was comfortable with these as well.  My athletic trainer, Donna [name] did review the exercises with [name] today in the room. To perform 2x weekly. Ok for Tylenol as needed only. She will  follow-up with me as needed.  Follow-up: Return if symptoms worsen or fail to improve.   Meds & Orders: No orders of the defined types were placed in this encounter.  No orders of the defined types were placed in this encounter.    Procedures: No procedures performed      Clinical History: No specialty comments available.  She reports that she quit smoking about 75 years ago. Her smoking use included cigarettes. She started smoking about 77 years ago. She has never used smokeless tobacco. No results for input(s): "HGBA1C", "LABURIC" in the last 8760 hours.  Objective:    Physical Exam  Gen: Well-appearing, in no acute distress; non-toxic CV: Well-perfused. Warm.  Resp: Breathing unlabored on room air; no wheezing. Psych: Fluid speech in conversation; appropriate affect; normal thought process  Ortho Exam - Right hip: The right hip moves fluidly without restriction with internal/external logroll.  Negative FADIR test.  There is mild weakness with 4/5 strength of resisted hip abduction bilaterally however.  No gross difference between each hip.  Ambulates with a cane.  Imaging: No results found.  Past Medical/Family/Surgical/Social History: Medications & Allergies reviewed per EMR, new medications updated. Patient Active Problem List   Diagnosis Date Noted   Nonrheumatic aortic valve stenosis 10/08/2022   Dysesthesia affecting both sides of body 01/19/2021   Mild obstructive sleep apnea 10/23/2020   Excessive daytime sleepiness 07/25/2020   RLS (restless legs syndrome) 07/25/2020   Intermittent  palpitations 07/25/2020   Nonrheumatic aortic valve insufficiency    Bradycardia 11/13/2019   Mitral regurgitation and aortic stenosis 11/12/2019   Severe mitral regurgitation 11/12/2019   SVT (supraventricular tachycardia) (HCC) 11/12/2019   Exertional dyspnea 10/02/2019   Overweight (BMI 25.0-29.9) 10/14/2017   BMI 27.0-27.9,adult 10/14/2017   Balance problem 10/14/2017    Neuropathic pain 09/03/2016   Bilateral nonexudative age-related macular degeneration 09/03/2016   Generalized osteoarthritis of multiple sites 05/07/2013   Hypothyroidism 05/07/2013   Cyst of thyroid 05/07/2013   Hyperhidrosis 05/07/2013   Essential hypertension, benign 05/07/2013   Cough 01/31/2012   Past Medical History:  Diagnosis Date   Aortic stenosis    Hemorrhage of gastrointestinal tract, unspecified    Hypertension    Migraine    Mitral regurgitation    OA (osteoarthritis)    Thyroid cyst    Family History  Problem Relation Age of Onset   CVA Father 48   Pancreatic cancer Mother    CVA Sister 5   Brain cancer Brother    Arthritis Sister    Heart disease Sister    Breast cancer Neg Hx    Past Surgical History:  Procedure Laterality Date   CATARACT EXTRACTION  1990   rt   CATARACT EXTRACTION  2005   left   LEFT HEART CATH AND CORONARY ANGIOGRAPHY N/A 10/06/2019   Procedure: LEFT HEART CATH AND CORONARY ANGIOGRAPHY;  Surgeon: Elder Negus, MD;  Location: [facility];  Service: Cardiovascular;  Laterality: N/A;   REVISION TOTAL KNEE ARTHROPLASTY  2008   SVT ABLATION N/A 01/18/2020   Procedure: SVT ABLATION;  Surgeon: Marinus Maw, MD;  Location: [facility];  Service: Cardiovascular;  Laterality: N/A;   THUMB ARTHROSCOPY     TONSILLECTOMY     Social History   Occupational History   Not on file  Tobacco Use   Smoking status: Former    Current packs/day: 0.00    Types: Cigarettes    Start date: 01/15/1946    Quit date: 01/16/1948    Years since quitting: 75.1   Smokeless tobacco: Never   Tobacco comments:    smoked on occ when she was a teen  Vaping Use   Vaping status: Never Used  Substance and Sexual Activity   Alcohol use: Not Currently   Drug use: No   Sexual activity: Not on file

## 2023-02-08 NOTE — Progress Notes (Signed)
Patient says that she is feeling good. She says that the injection worked right away and she now has no pain. She also says that her pain and tenderness on the side of the hip seems to be better as well.   Patient was instructed in 10 minutes of therapeutic exercises for right hip to improve strength, ROM and function according to my instructions and plan of care by a Certified Athletic Trainer during the office visit. A customized handout was provided and demonstration of proper technique shown and discussed. Patient did perform exercises and demonstrate understanding through teachback.  All questions discussed and answered.

## 2023-04-26 DIAGNOSIS — H353123 Nonexudative age-related macular degeneration, left eye, advanced atrophic without subfoveal involvement: Secondary | ICD-10-CM | POA: Diagnosis not present

## 2023-04-26 DIAGNOSIS — H43813 Vitreous degeneration, bilateral: Secondary | ICD-10-CM | POA: Diagnosis not present

## 2023-04-26 DIAGNOSIS — H353114 Nonexudative age-related macular degeneration, right eye, advanced atrophic with subfoveal involvement: Secondary | ICD-10-CM | POA: Diagnosis not present

## 2023-05-07 ENCOUNTER — Other Ambulatory Visit: Payer: Self-pay | Admitting: Nurse Practitioner

## 2023-05-07 DIAGNOSIS — E038 Other specified hypothyroidism: Secondary | ICD-10-CM

## 2023-05-07 DIAGNOSIS — E041 Nontoxic single thyroid nodule: Secondary | ICD-10-CM

## 2023-05-14 ENCOUNTER — Encounter: Payer: Self-pay | Admitting: Adult Health

## 2023-05-14 ENCOUNTER — Ambulatory Visit: Admitting: Adult Health

## 2023-05-14 VITALS — BP 142/64 | HR 61 | Temp 97.5°F | Ht 62.0 in | Wt 141.6 lb

## 2023-05-14 DIAGNOSIS — I10 Essential (primary) hypertension: Secondary | ICD-10-CM

## 2023-05-14 DIAGNOSIS — E039 Hypothyroidism, unspecified: Secondary | ICD-10-CM

## 2023-05-14 DIAGNOSIS — R42 Dizziness and giddiness: Secondary | ICD-10-CM | POA: Diagnosis not present

## 2023-05-14 DIAGNOSIS — E550 Rickets, active: Secondary | ICD-10-CM | POA: Diagnosis not present

## 2023-05-14 DIAGNOSIS — E559 Vitamin D deficiency, unspecified: Secondary | ICD-10-CM | POA: Diagnosis not present

## 2023-05-14 LAB — BASIC METABOLIC PANEL WITHOUT GFR
BUN: 22 mg/dL (ref 7–25)
CO2: 28 mmol/L (ref 20–32)
Calcium: 9.9 mg/dL (ref 8.6–10.4)
Chloride: 101 mmol/L (ref 98–110)
Creat: 0.72 mg/dL (ref 0.60–0.95)
Glucose, Bld: 85 mg/dL (ref 65–139)
Potassium: 4.7 mmol/L (ref 3.5–5.3)
Sodium: 134 mmol/L — ABNORMAL LOW (ref 135–146)

## 2023-05-14 LAB — CBC WITH DIFFERENTIAL/PLATELET
Absolute Lymphocytes: 1260 {cells}/uL (ref 850–3900)
Absolute Monocytes: 917 {cells}/uL (ref 200–950)
Basophils Absolute: 19 {cells}/uL (ref 0–200)
Basophils Relative: 0.4 %
Eosinophils Absolute: 42 {cells}/uL (ref 15–500)
Eosinophils Relative: 0.9 %
HCT: 39 % (ref 35.0–45.0)
Hemoglobin: 13.2 g/dL (ref 11.7–15.5)
MCH: 29.8 pg (ref 27.0–33.0)
MCHC: 33.8 g/dL (ref 32.0–36.0)
MCV: 88 fL (ref 80.0–100.0)
MPV: 10.8 fL (ref 7.5–12.5)
Monocytes Relative: 19.5 %
Neutro Abs: 2463 {cells}/uL (ref 1500–7800)
Neutrophils Relative %: 52.4 %
Platelets: 115 10*3/uL — ABNORMAL LOW (ref 140–400)
RBC: 4.43 10*6/uL (ref 3.80–5.10)
RDW: 12.6 % (ref 11.0–15.0)
Total Lymphocyte: 26.8 %
WBC: 4.7 10*3/uL (ref 3.8–10.8)

## 2023-05-14 LAB — TSH: TSH: 1.77 m[IU]/L (ref 0.40–4.50)

## 2023-05-14 LAB — VITAMIN D 25 HYDROXY (VIT D DEFICIENCY, FRACTURES): Vit D, 25-Hydroxy: 34 ng/mL (ref 30–100)

## 2023-05-14 MED ORDER — MECLIZINE HCL 12.5 MG PO TABS: 12.5000 mg | ORAL_TABLET | Freq: Three times a day (TID) | ORAL | 0 refills | Status: DC | PRN

## 2023-05-14 NOTE — Progress Notes (Signed)
 [facility]  Provider:  Inge Mangle DNP  Code Status:  DNR  Goals of Care:     12/28/2022    9:38 AM  Advanced Directives  Does Patient Have a Medical Advance Directive? Yes  Type of Estate agent of [place];Out of facility DNR (pink MOST or yellow form)  Does patient want to make changes to medical advance directive? No - Patient declined  Copy of Healthcare Power of Attorney in Chart? Yes - validated most recent copy scanned in chart (See row information)  Pre-existing out of facility DNR order (yellow form or pink MOST form) Yellow form placed in chart (order not valid for inpatient use)     Chief Complaint  Patient presents with   Dizziness    Started about 1 week ago she notice , no headaches no nausea, pt  check he b/p  check  she stated that she always runs in 140's, no nasal congestion , or ear pain , having dizziness when turn her head  , feeling tried     Discussed the use of AI scribe software for clinical note transcription with the patient, who gave verbal consent to proceed.  HPI: Patient is a 89 y.o. female seen today for an acute visit for dizziness. She was accompanied by her daughter who is a physical therapist.  She has been experiencing persistent dizziness for the past week, which began on Tuesday. The dizziness has not improved with head positioning maneuvers. Her daughter attempted therapy for vertigo without success.  She reports increased fatigue, especially with minimal activity, and has been using a rollator for stability due to unsteadiness, particularly when getting out of bed.  She has a history of low sodium levels, although her last lab results were normal. She is not currently taking any diuretics. Her current medications include metoprolol , hydralazine , losartan  for hypertension, and levothyroxine  100 mcg daily for hypothyroidism. She missed her levothyroxine  for two to three days recently, which coincided with the  onset of her dizziness.  No ear pain, leg swelling, difficulty urinating, diarrhea, or changes in bowel movements. She reports decreased appetite but is still eating.  She lives with her daughter and grandson, who are often at work or school, but she is generally capable of managing on her own.  Past Medical History:  Diagnosis Date   Aortic stenosis    Hemorrhage of gastrointestinal tract, unspecified    Hypertension    Migraine    Mitral regurgitation    OA (osteoarthritis)    Thyroid  cyst     Past Surgical History:  Procedure Laterality Date   CATARACT EXTRACTION  1990   rt   CATARACT EXTRACTION  2005   left   LEFT HEART CATH AND CORONARY ANGIOGRAPHY N/A 10/06/2019   Procedure: LEFT HEART CATH AND CORONARY ANGIOGRAPHY;  Surgeon: Cody Das, MD;  [facility]: [facility];  Service: Cardiovascular;  Laterality: N/A;   REVISION TOTAL KNEE ARTHROPLASTY  2008   SVT ABLATION N/A 01/18/2020   Procedure: SVT ABLATION;  Surgeon: Tammie Fall, MD;  Location: [facility];  Service: Cardiovascular;  Laterality: N/A;   THUMB ARTHROSCOPY     TONSILLECTOMY      No Known Allergies  Outpatient Encounter Medications as of 05/14/2023  Medication Sig   Blood Pressure Monitoring KIT Essential hypertension check bp daily   Calcium  Carbonate-Vitamin D 600-400 MG-UNIT per tablet Take 1 tablet by mouth. At lunch and at bedtime   diclofenac  Sodium (VOLTAREN )  1 % GEL Apply 4 g topically 4 (four) times daily as needed.   hydrALAZINE  (APRESOLINE ) 50 MG tablet TAKE 1 TABLET(50 MG) BY MOUTH THREE TIMES DAILY   levothyroxine  (SYNTHROID ) 100 MCG tablet TAKE 1 TABLET BY MOUTH DAILY 30 MINUTES BEFORE BREAKFAST ON AN EMPTY STOMACH   Light Mineral Oil-Mineral Oil (RETAINE MGD) 0.5-0.5 % EMUL Place 1 drop into both eyes in the morning and at bedtime.   losartan  (COZAAR ) 100 MG tablet TAKE 1 TABLET(100 MG) BY MOUTH EVERY MORNING   meclizine (ANTIVERT) 12.5 MG tablet Take 1 tablet (12.5 mg  total) by mouth 3 (three) times daily as needed for dizziness.   metoprolol  succinate (TOPROL -XL) 100 MG 24 hr tablet Take 100 mg by mouth daily. Take with or immediately following a meal.   OVER THE COUNTER MEDICATION Take 2 capsules by mouth daily. MitoQ   vitamin B-12 (CYANOCOBALAMIN ) 1000 MCG tablet Take 1 tablet (1,000 mcg total) by mouth daily.   Multiple Vitamins-Minerals (PRESERVISION AREDS 2) CAPS Take 1 capsule by mouth 2 (two) times a day.    No facility-administered encounter medications on file as of 05/14/2023.    Review of Systems:  Review of Systems  Constitutional:  Negative for appetite change, chills, fatigue and fever.  HENT:  Negative for congestion, hearing loss, rhinorrhea and sore throat.   Eyes: Negative.   Respiratory:  Negative for cough, shortness of breath and wheezing.   Cardiovascular:  Negative for chest pain, palpitations and leg swelling.  Gastrointestinal:  Negative for abdominal pain, constipation, diarrhea, nausea and vomiting.  Genitourinary:  Negative for dysuria.  Musculoskeletal:  Negative for arthralgias, back pain and myalgias.  Skin:  Negative for color change, rash and wound.  Neurological:  Positive for dizziness. Negative for weakness and headaches.  Psychiatric/Behavioral:  Negative for behavioral problems. The patient is not nervous/anxious.     Health Maintenance  Topic Date Due   COVID-19 Vaccine (7 - 2024-25 season) 05/10/2023   DEXA SCAN  01/16/2028 (Originally 01/04/1995)   INFLUENZA VACCINE  08/16/2023   DTaP/Tdap/Td (2 - Td or Tdap) 08/28/2023   Medicare Annual Wellness (AWV)  11/09/2023   Pneumonia Vaccine 38+ Years old  Completed   Zoster Vaccines- Shingrix  Completed   HPV VACCINES  Aged Out   Meningococcal B Vaccine  Aged Out    Physical Exam: Vitals:   05/14/23 1530 05/14/23 1538  BP:  (!) 142/64  Pulse:  61  Temp: (!) 97.5 F (36.4 C)   SpO2: 98%   Weight: 141 lb 9.6 oz (64.2 kg)   Height: 5\' 2"  (1.575 m)     Body mass index is 25.9 kg/m. Physical Exam Constitutional:      Appearance: Normal appearance.  HENT:     Head: Normocephalic and atraumatic.     Nose: Nose normal.     Mouth/Throat:     Mouth: Mucous membranes are moist.  Eyes:     Conjunctiva/sclera: Conjunctivae normal.  Cardiovascular:     Rate and Rhythm: Normal rate and regular rhythm.     Heart sounds: Murmur heard.  Pulmonary:     Effort: Pulmonary effort is normal.     Breath sounds: Normal breath sounds.  Abdominal:     General: Bowel sounds are normal.     Palpations: Abdomen is soft.  Musculoskeletal:        General: Normal range of motion.     Cervical back: Normal range of motion.  Skin:    General: Skin is  warm and dry.  Neurological:     General: No focal deficit present.     Mental Status: She is alert and oriented to person, place, and time.  Psychiatric:        Mood and Affect: Mood normal.        Behavior: Behavior normal.        Thought Content: Thought content normal.        Judgment: Judgment normal.     Labs reviewed: Basic Metabolic Panel: Recent Labs    12/28/22 1130  NA 136  K 4.8  CL 104  CO2 28  GLUCOSE 86  BUN 16  CREATININE 0.80  CALCIUM  9.9  TSH 2.66   Liver Function Tests: Recent Labs    12/28/22 1130  AST 15  ALT 15  BILITOT 0.4  PROT 5.7*   No results for input(s): "LIPASE", "AMYLASE" in the last 8760 hours. No results for input(s): "AMMONIA" in the last 8760 hours. CBC: Recent Labs    12/28/22 1130  WBC 4.1  NEUTROABS 2,087  HGB 12.3  HCT 37.0  MCV 91.6  PLT 133*   Lipid Panel: No results for input(s): "CHOL", "HDL", "LDLCALC", "TRIG", "CHOLHDL", "LDLDIRECT" in the last 8760 hours. Lab Results  Component Value Date   HGBA1C 5.1 08/22/2016    Procedures since last visit: No results found.  Assessment/Plan  1. Dizziness (Primary) -  Differential includes vertigo, anemia, and hyponatremia. Meclizine prescribed for symptom management. - Order  BMP and CBC. - Prescribe meclizine 12.5 mg TID as needed for dizziness. - CBC with Differential/Platelets - Basic Metabolic Panel with eGFR - meclizine (ANTIVERT) 12.5 MG tablet; Take 1 tablet (12.5 mg total) by mouth 3 (three) times daily as needed for dizziness.  Dispense: 30 tablet; Refill: 0 - Vitamin D, 25-hydroxy  2. Essential hypertension, benign -  BP stable -  continue metoprolol , hydralazine , and losartan . -  monitor BP and pulse daily  3. Hypothyroidism, unspecified type -  did not take Levothyroxine  X 2 days, ran out of supply -  continue Levothyroxine  100 mcg daily - TSH     Labs/tests ordered:   CBC, BMP, tsh, vitamin D level   Return if symptoms worsen or fail to improve.  Donna Gathright Medina-Vargas, NP

## 2023-05-15 NOTE — Progress Notes (Signed)
-   Platelet 115, down from 133 (normal 140-400), can be re-checked on next visit. -Sodium 134, slightly low with normal level 135-146, pls drink Gatorade or Pedialyte for electrolytes -TSH and vitamin D normal

## 2023-05-16 ENCOUNTER — Telehealth: Payer: Self-pay

## 2023-05-16 NOTE — Telephone Encounter (Signed)
 Copied from CRM [id number]. Topic: Clinical - Lab/Test Results >> May 16, 2023 10:18 AM Brynn Caras wrote: Reason for CRM: The patient returned CMA Demetria's missed call to discuss her lab results. Provided the results verbatim left by her PCP from 05/15/2023. The patient had no additional questions.

## 2023-05-16 NOTE — Telephone Encounter (Signed)
 Message routed to Medical Assistant as Arlie Lain.

## 2023-06-17 ENCOUNTER — Other Ambulatory Visit: Payer: Self-pay | Admitting: Nurse Practitioner

## 2023-06-17 DIAGNOSIS — I10 Essential (primary) hypertension: Secondary | ICD-10-CM

## 2023-06-21 ENCOUNTER — Ambulatory Visit (HOSPITAL_COMMUNITY)
Admission: RE | Admit: 2023-06-21 | Discharge: 2023-06-21 | Disposition: A | Discharge status: Home / Self Care | Payer: Medicare PPO | Source: Ambulatory Visit | Attending: Internal Medicine | Admitting: Internal Medicine

## 2023-06-21 DIAGNOSIS — I350 Nonrheumatic aortic (valve) stenosis: Secondary | ICD-10-CM | POA: Diagnosis not present

## 2023-06-21 LAB — ECHOCARDIOGRAM COMPLETE
AR max vel: 1.56 cm2
AV Area VTI: 1.38 cm2
AV Area mean vel: 1.5 cm2
AV Mean grad: 21.8 mmHg
AV Peak grad: 40 mmHg
Ao pk vel: 3.16 m/s
Area-P 1/2: 3.44 cm2
MV M vel: 6.95 m/s
MV Peak grad: 193.2 mmHg
P 1/2 time: 413 ms
Radius: 1.05 cm
S' Lateral: 2.1 cm

## 2023-06-26 ENCOUNTER — Ambulatory Visit: Payer: Self-pay | Admitting: Cardiology

## 2023-06-27 NOTE — Telephone Encounter (Signed)
 Pts daughter returning call in regards to results. Please advise

## 2023-07-05 ENCOUNTER — Ambulatory Visit: Payer: Medicare PPO | Admitting: Nurse Practitioner

## 2023-07-05 ENCOUNTER — Encounter: Payer: Self-pay | Admitting: Nurse Practitioner

## 2023-07-05 VITALS — BP 134/60 | HR 75 | Temp 97.9°F | Ht 62.0 in | Wt 140.0 lb

## 2023-07-05 DIAGNOSIS — K5904 Chronic idiopathic constipation: Secondary | ICD-10-CM | POA: Diagnosis not present

## 2023-07-05 DIAGNOSIS — I471 Supraventricular tachycardia, unspecified: Secondary | ICD-10-CM

## 2023-07-05 DIAGNOSIS — E039 Hypothyroidism, unspecified: Secondary | ICD-10-CM

## 2023-07-05 DIAGNOSIS — M159 Polyosteoarthritis, unspecified: Secondary | ICD-10-CM | POA: Diagnosis not present

## 2023-07-05 DIAGNOSIS — I080 Rheumatic disorders of both mitral and aortic valves: Secondary | ICD-10-CM

## 2023-07-05 DIAGNOSIS — I10 Essential (primary) hypertension: Secondary | ICD-10-CM | POA: Diagnosis not present

## 2023-07-05 DIAGNOSIS — M7062 Trochanteric bursitis, left hip: Secondary | ICD-10-CM | POA: Diagnosis not present

## 2023-07-05 DIAGNOSIS — M7061 Trochanteric bursitis, right hip: Secondary | ICD-10-CM | POA: Diagnosis not present

## 2023-07-05 DIAGNOSIS — E871 Hypo-osmolality and hyponatremia: Secondary | ICD-10-CM | POA: Insufficient documentation

## 2023-07-05 NOTE — Assessment & Plan Note (Signed)
 Blood pressure well controlled, goal bp <140/90 Continue current medications and dietary modifications follow metabolic panel

## 2023-07-05 NOTE — Assessment & Plan Note (Signed)
 Tsh at goal on last labs Continue synthroid  100 mcg

## 2023-07-05 NOTE — Assessment & Plan Note (Signed)
 Resolved at this time after injection.

## 2023-07-05 NOTE — Assessment & Plan Note (Signed)
Rate controlled on metoprolol.  °

## 2023-07-05 NOTE — Progress Notes (Signed)
 Careteam: Patient Care Team: Verma Gobble, NP as PCP - General (Geriatric Medicine) Olinda Bertrand, DO as PCP - Cardiology (Cardiology) Darryle Ends, OD as Consulting Physician (Optometry) Coye Diver Petra Brandy., MD as Referring Physician (Orthopedic Surgery) Olinda Bertrand, DO as Referring Physician (Cardiology)  PLACE OF SERVICE:  [facility]  Advanced Directive information    No Known Allergies  Chief Complaint  Patient presents with   Medical Management of Chronic Issues    6 month follow up with labs    HPI:  Discussed the use of AI scribe software for clinical note transcription with the patient, who gave verbal consent to proceed.  History of Present Illness Donna [name] is a 89 year old female who presents for a six-month follow-up visit.  She has a history of bilateral trochanteric bursitis, previously treated with ultrasound-guided injections, which have effectively alleviated her hip pain and improved her mobility. Currently, she experiences no hip pain.  She has experienced a few episodes of vertigo. She ensures adequate hydration, especially in hot weather, to manage these symptoms.  Her current medications include metoprolol  100 mg daily, losartan  100 mg daily, hydralazine  50 mg three times a day, and Synthroid  100 mcg daily. She also takes calcium  and vitamin D  supplements. Her thyroid  levels were checked in April and were normal.  She has lost weight since December, attributing it to eating less heartily and possibly wearing lighter clothing. She maintains a diet of three meals a day and is mindful of her nutrition to sustain energy.  Socially, she lives with her daughter and grandson.    Review of Systems:  Review of Systems  Constitutional:  Negative for chills, fever and weight loss.  HENT:  Negative for tinnitus.   Respiratory:  Negative for cough, sputum production and shortness of breath.   Cardiovascular:  Negative for chest pain, palpitations and  leg swelling.  Gastrointestinal:  Negative for abdominal pain, constipation, diarrhea and heartburn.  Genitourinary:  Negative for dysuria, frequency and urgency.  Musculoskeletal:  Negative for back pain, falls, joint pain and myalgias.  Skin: Negative.   Neurological:  Negative for dizziness and headaches.  Psychiatric/Behavioral:  Negative for depression and memory loss. The patient does not have insomnia.     Past Medical History:  Diagnosis Date   Aortic stenosis    Hemorrhage of gastrointestinal tract, unspecified    Hypertension    Migraine    Mitral regurgitation    OA (osteoarthritis)    Thyroid  cyst    Past Surgical History:  Procedure Laterality Date   CATARACT EXTRACTION  1990   rt   CATARACT EXTRACTION  2005   left   LEFT HEART CATH AND CORONARY ANGIOGRAPHY N/A 10/06/2019   Procedure: LEFT HEART CATH AND CORONARY ANGIOGRAPHY;  Surgeon: Cody Das, MD;  Location: [facility];  Service: Cardiovascular;  Laterality: N/A;   REVISION TOTAL KNEE ARTHROPLASTY  2008   SVT ABLATION N/A 01/18/2020   Procedure: SVT ABLATION;  Surgeon: Tammie Fall, MD;  Location: [facility];  Service: Cardiovascular;  Laterality: N/A;   THUMB ARTHROSCOPY     TONSILLECTOMY     Social History:   reports that she quit smoking about 75 years ago. Her smoking use included cigarettes. She started smoking about 77 years ago. She has never used smokeless tobacco. She reports that she does not currently use alcohol. She reports that she does not use drugs.  Family History  Problem Relation Age of  Onset   CVA Father 35   Pancreatic cancer Mother    CVA Sister 26   Brain cancer Brother    Arthritis Sister    Heart disease Sister    Breast cancer Neg Hx     Medications: Patient's Medications  New Prescriptions   No medications on file  Previous Medications   BLOOD PRESSURE MONITORING KIT    Essential hypertension check bp daily   CALCIUM  CARBONATE-VITAMIN D  600-400  MG-UNIT PER TABLET    Take 1 tablet by mouth. At lunch and at bedtime   DICLOFENAC  SODIUM (VOLTAREN ) 1 % GEL    Apply 4 g topically 4 (four) times daily as needed.   HYDRALAZINE  (APRESOLINE ) 50 MG TABLET    TAKE 1 TABLET BY MOUTH 3 TIMES A DAY   LEVOTHYROXINE  (SYNTHROID ) 100 MCG TABLET    TAKE 1 TABLET BY MOUTH DAILY 30 MINUTES BEFORE BREAKFAST ON AN EMPTY STOMACH   LIGHT MINERAL OIL-MINERAL OIL (RETAINE MGD) 0.5-0.5 % EMUL    Place 1 drop into both eyes in the morning and at bedtime.   LOSARTAN  (COZAAR ) 100 MG TABLET    TAKE 1 TABLET(100 MG) BY MOUTH EVERY MORNING   MECLIZINE  (ANTIVERT ) 12.5 MG TABLET    Take 1 tablet (12.5 mg total) by mouth 3 (three) times daily as needed for dizziness.   METOPROLOL  SUCCINATE (TOPROL -XL) 100 MG 24 HR TABLET    Take 100 mg by mouth daily. Take with or immediately following a meal.   MULTIPLE VITAMINS-MINERALS (PRESERVISION AREDS 2) CAPS    Take 1 capsule by mouth 2 (two) times a day.    OVER THE COUNTER MEDICATION    Take 2 capsules by mouth daily. MitoQ   VITAMIN B-12 (CYANOCOBALAMIN ) 1000 MCG TABLET    Take 1 tablet (1,000 mcg total) by mouth daily.  Modified Medications   No medications on file  Discontinued Medications   No medications on file    Physical Exam:  Vitals:   07/05/23 0813 07/05/23 0917  BP: (!) 142/60 134/60  Pulse: 75   Temp: 97.9 F (36.6 C)   SpO2: 96%   Weight: 140 lb (63.5 kg)   Height: 5' 2 (1.575 m)    Body mass index is 25.61 kg/m. Wt Readings from Last 3 Encounters:  07/05/23 140 lb (63.5 kg)  05/14/23 141 lb 9.6 oz (64.2 kg)  12/28/22 148 lb (67.1 kg)    Physical Exam Constitutional:      General: She is not in acute distress.    Appearance: She is well-developed. She is not diaphoretic.  HENT:     Head: Normocephalic and atraumatic.     Mouth/Throat:     Pharynx: No oropharyngeal exudate.   Eyes:     Conjunctiva/sclera: Conjunctivae normal.     Pupils: Pupils are equal, round, and reactive to light.     Cardiovascular:     Rate and Rhythm: Normal rate and regular rhythm.     Heart sounds: Murmur heard.  Pulmonary:     Effort: Pulmonary effort is normal.     Breath sounds: Normal breath sounds.  Abdominal:     General: Bowel sounds are normal.     Palpations: Abdomen is soft.   Musculoskeletal:     Cervical back: Normal range of motion and neck supple.     Right lower leg: No edema.     Left lower leg: No edema.   Skin:    General: Skin is warm and dry.  Neurological:     Mental Status: She is alert.   Psychiatric:        Mood and Affect: Mood normal.     Labs reviewed: Basic Metabolic Panel: Recent Labs    12/28/22 1130 05/14/23 1552  NA 136 134*  K 4.8 4.7  CL 104 101  CO2 28 28  GLUCOSE 86 85  BUN 16 22  CREATININE 0.80 0.72  CALCIUM  9.9 9.9  TSH 2.66 1.77   Liver Function Tests: Recent Labs    12/28/22 1130  AST 15  ALT 15  BILITOT 0.4  PROT 5.7*   No results for input(s): LIPASE, AMYLASE in the last 8760 hours. No results for input(s): AMMONIA in the last 8760 hours. CBC: Recent Labs    12/28/22 1130 05/14/23 1552  WBC 4.1 4.7  NEUTROABS 2,087 2,463  HGB 12.3 13.2  HCT 37.0 39.0  MCV 91.6 88.0  PLT 133* 115*   Lipid Panel: No results for input(s): CHOL, HDL, LDLCALC, TRIG, CHOLHDL, LDLDIRECT in the last 8760 hours. TSH: Recent Labs    12/28/22 1130 05/14/23 1552  TSH 2.66 1.77   A1C: Lab Results  Component Value Date   HGBA1C 5.1 08/22/2016     Assessment/Plan  Essential hypertension, benign Assessment & Plan: Blood pressure well controlled, goal bp <140/90 Continue current medications and dietary modifications follow metabolic panel  Orders: -     CBC with Differential/Platelet -     COMPLETE METABOLIC PANEL WITHOUT GFR  Hypothyroidism, unspecified type Assessment & Plan: Tsh at goal on last labs Continue synthroid  100 mcg   Trochanteric bursitis of both hips Assessment & Plan: Resolved  at this time after injection.    Chronic idiopathic constipation  Hyponatremia Assessment & Plan: Continue to monitor Follow labs   SVT (supraventricular tachycardia) (HCC) Assessment & Plan: Rate controlled on metoprolol    Mitral regurgitation and aortic stenosis Assessment & Plan: Recent echo, followed by cardiologist, without shortness of breath or LE edema   Generalized osteoarthritis of multiple sites Assessment & Plan: Stable at this time, continue supportive care and lifestyle modifications      Return in about 6 months (around 01/04/2024) for routine follow up.  Kealey Kemmer K. Denney Fisherman [facility] [phone]

## 2023-07-05 NOTE — Assessment & Plan Note (Signed)
 Recent echo, followed by cardiologist, without shortness of breath or LE edema

## 2023-07-05 NOTE — Assessment & Plan Note (Signed)
 Continue to monitor Follow labs

## 2023-07-05 NOTE — Assessment & Plan Note (Signed)
 Stable at this time, continue supportive care and lifestyle modifications

## 2023-07-06 LAB — COMPLETE METABOLIC PANEL WITHOUT GFR
AG Ratio: 2.7 (calc) — ABNORMAL HIGH (ref 1.0–2.5)
ALT: 14 U/L (ref 6–29)
AST: 12 U/L (ref 10–35)
Albumin: 4.1 g/dL (ref 3.6–5.1)
Alkaline phosphatase (APISO): 43 U/L (ref 37–153)
BUN: 14 mg/dL (ref 7–25)
CO2: 29 mmol/L (ref 20–32)
Calcium: 9.8 mg/dL (ref 8.6–10.4)
Chloride: 105 mmol/L (ref 98–110)
Creat: 0.76 mg/dL (ref 0.60–0.95)
Globulin: 1.5 g/dL — ABNORMAL LOW (ref 1.9–3.7)
Glucose, Bld: 94 mg/dL (ref 65–99)
Potassium: 4.6 mmol/L (ref 3.5–5.3)
Sodium: 139 mmol/L (ref 135–146)
Total Bilirubin: 0.5 mg/dL (ref 0.2–1.2)
Total Protein: 5.6 g/dL — ABNORMAL LOW (ref 6.1–8.1)

## 2023-07-06 LAB — CBC WITH DIFFERENTIAL/PLATELET
Absolute Lymphocytes: 1221 {cells}/uL (ref 850–3900)
Absolute Monocytes: 770 {cells}/uL (ref 200–950)
Basophils Absolute: 9 {cells}/uL (ref 0–200)
Basophils Relative: 0.2 %
Eosinophils Absolute: 30 {cells}/uL (ref 15–500)
Eosinophils Relative: 0.7 %
HCT: 39.1 % (ref 35.0–45.0)
Hemoglobin: 12.9 g/dL (ref 11.7–15.5)
MCH: 29.7 pg (ref 27.0–33.0)
MCHC: 33 g/dL (ref 32.0–36.0)
MCV: 90.1 fL (ref 80.0–100.0)
MPV: 10.6 fL (ref 7.5–12.5)
Monocytes Relative: 17.9 %
Neutro Abs: 2270 {cells}/uL (ref 1500–7800)
Neutrophils Relative %: 52.8 %
Platelets: 111 10*3/uL — ABNORMAL LOW (ref 140–400)
RBC: 4.34 10*6/uL (ref 3.80–5.10)
RDW: 12.4 % (ref 11.0–15.0)
Total Lymphocyte: 28.4 %
WBC: 4.3 10*3/uL (ref 3.8–10.8)

## 2023-07-08 ENCOUNTER — Ambulatory Visit: Payer: Self-pay | Admitting: Nurse Practitioner

## 2023-07-10 ENCOUNTER — Other Ambulatory Visit: Payer: Self-pay

## 2023-07-10 ENCOUNTER — Encounter (HOSPITAL_COMMUNITY): Payer: Self-pay | Admitting: Emergency Medicine

## 2023-07-10 ENCOUNTER — Emergency Department (HOSPITAL_COMMUNITY)

## 2023-07-10 ENCOUNTER — Inpatient Hospital Stay (HOSPITAL_COMMUNITY)
Admission: EM | Admit: 2023-07-10 | Discharge: 2023-07-13 | DRG: 291 | Disposition: A | Discharge status: Home / Self Care | Attending: Internal Medicine | Admitting: Internal Medicine

## 2023-07-10 DIAGNOSIS — I10 Essential (primary) hypertension: Secondary | ICD-10-CM | POA: Diagnosis present

## 2023-07-10 DIAGNOSIS — Z808 Family history of malignant neoplasm of other organs or systems: Secondary | ICD-10-CM

## 2023-07-10 DIAGNOSIS — I081 Rheumatic disorders of both mitral and tricuspid valves: Secondary | ICD-10-CM | POA: Diagnosis present

## 2023-07-10 DIAGNOSIS — N1832 Chronic kidney disease, stage 3b: Secondary | ICD-10-CM | POA: Diagnosis present

## 2023-07-10 DIAGNOSIS — Z823 Family history of stroke: Secondary | ICD-10-CM | POA: Diagnosis not present

## 2023-07-10 DIAGNOSIS — I5033 Acute on chronic diastolic (congestive) heart failure: Secondary | ICD-10-CM | POA: Diagnosis present

## 2023-07-10 DIAGNOSIS — I959 Hypotension, unspecified: Secondary | ICD-10-CM | POA: Diagnosis present

## 2023-07-10 DIAGNOSIS — E039 Hypothyroidism, unspecified: Secondary | ICD-10-CM | POA: Diagnosis present

## 2023-07-10 DIAGNOSIS — Z8261 Family history of arthritis: Secondary | ICD-10-CM | POA: Diagnosis not present

## 2023-07-10 DIAGNOSIS — I471 Supraventricular tachycardia, unspecified: Secondary | ICD-10-CM | POA: Diagnosis present

## 2023-07-10 DIAGNOSIS — Z66 Do not resuscitate: Secondary | ICD-10-CM | POA: Diagnosis present

## 2023-07-10 DIAGNOSIS — N179 Acute kidney failure, unspecified: Secondary | ICD-10-CM | POA: Diagnosis not present

## 2023-07-10 DIAGNOSIS — R918 Other nonspecific abnormal finding of lung field: Secondary | ICD-10-CM | POA: Diagnosis not present

## 2023-07-10 DIAGNOSIS — Z8249 Family history of ischemic heart disease and other diseases of the circulatory system: Secondary | ICD-10-CM

## 2023-07-10 DIAGNOSIS — E871 Hypo-osmolality and hyponatremia: Secondary | ICD-10-CM | POA: Diagnosis present

## 2023-07-10 DIAGNOSIS — Z800 Family history of malignant neoplasm of digestive organs: Secondary | ICD-10-CM | POA: Diagnosis not present

## 2023-07-10 DIAGNOSIS — R7989 Other specified abnormal findings of blood chemistry: Secondary | ICD-10-CM | POA: Diagnosis not present

## 2023-07-10 DIAGNOSIS — Z87891 Personal history of nicotine dependence: Secondary | ICD-10-CM

## 2023-07-10 DIAGNOSIS — I130 Hypertensive heart and chronic kidney disease with heart failure and stage 1 through stage 4 chronic kidney disease, or unspecified chronic kidney disease: Principal | ICD-10-CM | POA: Diagnosis present

## 2023-07-10 DIAGNOSIS — E875 Hyperkalemia: Secondary | ICD-10-CM | POA: Diagnosis present

## 2023-07-10 DIAGNOSIS — I2721 Secondary pulmonary arterial hypertension: Secondary | ICD-10-CM | POA: Diagnosis present

## 2023-07-10 DIAGNOSIS — I080 Rheumatic disorders of both mitral and aortic valves: Secondary | ICD-10-CM | POA: Diagnosis present

## 2023-07-10 DIAGNOSIS — I110 Hypertensive heart disease with heart failure: Secondary | ICD-10-CM | POA: Diagnosis not present

## 2023-07-10 DIAGNOSIS — I2489 Other forms of acute ischemic heart disease: Secondary | ICD-10-CM | POA: Diagnosis present

## 2023-07-10 DIAGNOSIS — R0602 Shortness of breath: Secondary | ICD-10-CM | POA: Diagnosis present

## 2023-07-10 DIAGNOSIS — I421 Obstructive hypertrophic cardiomyopathy: Secondary | ICD-10-CM | POA: Diagnosis present

## 2023-07-10 DIAGNOSIS — I340 Nonrheumatic mitral (valve) insufficiency: Secondary | ICD-10-CM | POA: Diagnosis present

## 2023-07-10 DIAGNOSIS — I352 Nonrheumatic aortic (valve) stenosis with insufficiency: Secondary | ICD-10-CM | POA: Diagnosis not present

## 2023-07-10 DIAGNOSIS — Z79890 Hormone replacement therapy (postmenopausal): Secondary | ICD-10-CM

## 2023-07-10 DIAGNOSIS — Z96659 Presence of unspecified artificial knee joint: Secondary | ICD-10-CM | POA: Diagnosis present

## 2023-07-10 DIAGNOSIS — G4733 Obstructive sleep apnea (adult) (pediatric): Secondary | ICD-10-CM | POA: Diagnosis present

## 2023-07-10 DIAGNOSIS — Z79899 Other long term (current) drug therapy: Secondary | ICD-10-CM | POA: Diagnosis not present

## 2023-07-10 DIAGNOSIS — I498 Other specified cardiac arrhythmias: Secondary | ICD-10-CM | POA: Diagnosis not present

## 2023-07-10 DIAGNOSIS — I509 Heart failure, unspecified: Secondary | ICD-10-CM | POA: Diagnosis not present

## 2023-07-10 LAB — HEPATIC FUNCTION PANEL
ALT: 18 U/L (ref 0–44)
AST: 21 U/L (ref 15–41)
Albumin: 3.5 g/dL (ref 3.5–5.0)
Alkaline Phosphatase: 42 U/L (ref 38–126)
Bilirubin, Direct: 0.1 mg/dL (ref 0.0–0.2)
Indirect Bilirubin: 0.9 mg/dL (ref 0.3–0.9)
Total Bilirubin: 1 mg/dL (ref 0.0–1.2)
Total Protein: 5.5 g/dL — ABNORMAL LOW (ref 6.5–8.1)

## 2023-07-10 LAB — BASIC METABOLIC PANEL WITH GFR
Anion gap: 9 (ref 5–15)
BUN: 21 mg/dL (ref 8–23)
CO2: 20 mmol/L — ABNORMAL LOW (ref 22–32)
Calcium: 9.5 mg/dL (ref 8.9–10.3)
Chloride: 104 mmol/L (ref 98–111)
Creatinine, Ser: 1.31 mg/dL — ABNORMAL HIGH (ref 0.44–1.00)
GFR, Estimated: 38 mL/min — ABNORMAL LOW (ref >60)
Glucose, Bld: 159 mg/dL — ABNORMAL HIGH (ref 70–99)
Potassium: 5.7 mmol/L — ABNORMAL HIGH (ref 3.5–5.1)
Sodium: 133 mmol/L — ABNORMAL LOW (ref 135–145)

## 2023-07-10 LAB — CBC WITH DIFFERENTIAL/PLATELET
Abs Immature Granulocytes: 0.68 10*3/uL — ABNORMAL HIGH (ref 0.00–0.07)
Basophils Absolute: 0 10*3/uL (ref 0.0–0.1)
Basophils Relative: 0 %
Eosinophils Absolute: 0 10*3/uL (ref 0.0–0.5)
Eosinophils Relative: 0 %
HCT: 40.3 % (ref 36.0–46.0)
Hemoglobin: 13.2 g/dL (ref 12.0–15.0)
Immature Granulocytes: 6 %
Lymphocytes Relative: 9 %
Lymphs Abs: 1 10*3/uL (ref 0.7–4.0)
MCH: 30.1 pg (ref 26.0–34.0)
MCHC: 32.8 g/dL (ref 30.0–36.0)
MCV: 92 fL (ref 80.0–100.0)
Monocytes Absolute: 1.5 10*3/uL — ABNORMAL HIGH (ref 0.1–1.0)
Monocytes Relative: 13 %
Neutro Abs: 8.3 10*3/uL — ABNORMAL HIGH (ref 1.7–7.7)
Neutrophils Relative %: 72 %
Platelets: 156 10*3/uL (ref 150–400)
RBC: 4.38 MIL/uL (ref 3.87–5.11)
RDW: 13.3 % (ref 11.5–15.5)
WBC: 11.5 10*3/uL — ABNORMAL HIGH (ref 4.0–10.5)
nRBC: 0 % (ref 0.0–0.2)

## 2023-07-10 LAB — TROPONIN I (HIGH SENSITIVITY)
Troponin I (High Sensitivity): 112 ng/L (ref <18)
Troponin I (High Sensitivity): 126 ng/L (ref <18)

## 2023-07-10 LAB — MAGNESIUM: Magnesium: 2.1 mg/dL (ref 1.7–2.4)

## 2023-07-10 LAB — BRAIN NATRIURETIC PEPTIDE: B Natriuretic Peptide: 1687.9 pg/mL — ABNORMAL HIGH (ref 0.0–100.0)

## 2023-07-10 MED ORDER — SODIUM CHLORIDE 0.9 % IV BOLUS
500.0000 mL | Freq: Once | INTRAVENOUS | Status: AC
Start: 2023-07-10 — End: 2023-07-10
  Administered 2023-07-10: 500 mL via INTRAVENOUS

## 2023-07-10 MED ORDER — ENOXAPARIN SODIUM 30 MG/0.3ML IJ SOSY
30.0000 mg | PREFILLED_SYRINGE | INTRAMUSCULAR | Status: DC
  Administered 2023-07-10 – 2023-07-12 (×3): 30 mg via SUBCUTANEOUS
  Filled 2023-07-10 (×3): qty 0.3

## 2023-07-10 MED ORDER — GLUCAGON HCL RDNA (DIAGNOSTIC) 1 MG IJ SOLR
1.0000 mg | Freq: Once | INTRAMUSCULAR | Status: AC
  Administered 2023-07-10: 1 mg via INTRAVENOUS
  Filled 2023-07-10: qty 1

## 2023-07-10 MED ORDER — ALBUMIN HUMAN 25 % IV SOLN
25.0000 g | Freq: Once | INTRAVENOUS | Status: AC
  Administered 2023-07-10: 25 g via INTRAVENOUS
  Filled 2023-07-10: qty 100

## 2023-07-10 MED ORDER — ONDANSETRON HCL 4 MG PO TABS: 4.0000 mg | ORAL_TABLET | Freq: Four times a day (QID) | ORAL | Status: DC | PRN

## 2023-07-10 MED ORDER — LEVOTHYROXINE SODIUM 100 MCG PO TABS
100.0000 ug | ORAL_TABLET | Freq: Every day | ORAL | Status: DC
  Administered 2023-07-11 – 2023-07-13 (×3): 100 ug via ORAL
  Filled 2023-07-10 (×3): qty 1

## 2023-07-10 MED ORDER — ACETAMINOPHEN 325 MG PO TABS: 650.0000 mg | ORAL_TABLET | Freq: Four times a day (QID) | ORAL | Status: DC | PRN

## 2023-07-10 MED ORDER — FUROSEMIDE 10 MG/ML IJ SOLN
20.0000 mg | Freq: Once | INTRAMUSCULAR | Status: DC
  Filled 2023-07-10: qty 4

## 2023-07-10 MED ORDER — MIDODRINE HCL 5 MG PO TABS
5.0000 mg | ORAL_TABLET | Freq: Once | ORAL | Status: AC
  Administered 2023-07-10: 5 mg via ORAL
  Filled 2023-07-10: qty 1

## 2023-07-10 MED ORDER — SODIUM CHLORIDE 0.9 % IV BOLUS
500.0000 mL | Freq: Once | INTRAVENOUS | Status: AC
  Administered 2023-07-10: 500 mL via INTRAVENOUS

## 2023-07-10 MED ORDER — ACETAMINOPHEN 650 MG RE SUPP: 650.0000 mg | Freq: Four times a day (QID) | RECTAL | Status: DC | PRN

## 2023-07-10 MED ORDER — ONDANSETRON HCL 4 MG/2ML IJ SOLN: 4.0000 mg | Freq: Four times a day (QID) | INTRAMUSCULAR | Status: DC | PRN

## 2023-07-10 NOTE — ED Notes (Signed)
 Patient provided warm blankets.

## 2023-07-10 NOTE — ED Provider Notes (Signed)
 [place] EMERGENCY DEPARTMENT AT [facility] Provider Note   CSN: [id number] Arrival date & time: 07/10/23  9177     Patient presents with: No chief complaint on file.   Donna [name] is a 89 y.o. female.   HPI   89 year old female presents emergency department with concern for shortness of breath.  Patient states that it started last night before bed.  It became worse overnight, difficulty laying flat, had to prop herself up with pillows.  The shortness of breath continued this morning and was worse with any sort of exertion so she called her daughter who brought her in for evaluation.  She denies any sharp chest pain.  Complains of some mild chest heaviness.  Denies any back pain or leg swelling.  Has a little bit of upper back/lower neck soreness, she believes from where she propped herself up with the pillow.  Denies any recent fever, productive cough.  Has otherwise been compliant with her medications.  Recent echocardiogram did mention findings of severe concentric left ventricular hypertrophy, diastolic dysfunction with an EF of 70 to 73%. Follows with Dr. Michele for cardiology.   Prior to Admission medications   Medication Sig Start Date End Date Taking? Authorizing Provider  Blood Pressure Monitoring KIT Essential hypertension check bp daily 08/20/19   Reed, Tiffany L, DO  Calcium  Carbonate-Vitamin D  600-400 MG-UNIT per tablet Take 1 tablet by mouth. At lunch and at bedtime    [provider]  diclofenac  Sodium (VOLTAREN ) 1 % GEL Apply 4 g topically 4 (four) times daily as needed. 03/05/22   [name], NP  hydrALAZINE  (APRESOLINE ) 50 MG tablet TAKE 1 TABLET BY MOUTH 3 TIMES A DAY 06/17/23   Eubanks, Jessica K, NP  levothyroxine  (SYNTHROID ) 100 MCG tablet TAKE 1 TABLET BY MOUTH DAILY 30 MINUTES BEFORE BREAKFAST ON AN EMPTY STOMACH 05/07/23   [name], NP  Light Mineral Oil-Mineral Oil (RETAINE MGD) 0.5-0.5 % EMUL Place 1 drop into both eyes in the  morning and at bedtime.    [provider]  losartan  (COZAAR ) 100 MG tablet TAKE 1 TABLET(100 MG) BY MOUTH EVERY MORNING 12/11/22   Tolia, Sunit, DO  meclizine  (ANTIVERT ) 12.5 MG tablet Take 1 tablet (12.5 mg total) by mouth 3 (three) times daily as needed for dizziness. 05/14/23   Medina-Vargas, Monina C, NP  metoprolol  succinate (TOPROL -XL) 100 MG 24 hr tablet Take 100 mg by mouth daily. Take with or immediately following a meal.    [provider]  Multiple Vitamins-Minerals (PRESERVISION AREDS 2) CAPS Take 1 capsule by mouth 2 (two) times a day.     [provider]  OVER THE COUNTER MEDICATION Take 2 capsules by mouth daily. MitoQ    [provider]  vitamin B-12 (CYANOCOBALAMIN ) 1000 MCG tablet Take 1 tablet (1,000 mcg total) by mouth daily. 03/07/17   Reed, Tiffany L, DO    Allergies: Patient has no known allergies.    Review of Systems  Constitutional:  Positive for fatigue. Negative for fever.  Respiratory:  Positive for chest tightness and shortness of breath. Negative for cough and wheezing.   Cardiovascular:  Negative for chest pain, palpitations and leg swelling.  Gastrointestinal:  Negative for abdominal pain, diarrhea and vomiting.  Musculoskeletal:  Negative for back pain.  Skin:  Negative for rash.  Neurological:  Negative for headaches.    Updated Vital Signs BP (!) 100/49 (BP Location: Left Arm)   Pulse 66   Temp 97.9  F (36.6 C)   Resp 16   SpO2 97%   Physical Exam Vitals and nursing note reviewed.  Constitutional:      General: She is not in acute distress.    Appearance: Normal appearance.     Comments: Fatigued appearing  HENT:     Head: Normocephalic.     Mouth/Throat:     Mouth: Mucous membranes are moist.  Neck:     Comments: TTP trapezius muscles, mainly on left Cardiovascular:     Rate and Rhythm: Normal rate.     Heart sounds: Murmur heard.  Pulmonary:     Effort: Pulmonary effort is normal. No respiratory  distress.     Comments: Scattered rales at bases Abdominal:     Palpations: Abdomen is soft.     Tenderness: There is no abdominal tenderness.   Musculoskeletal:        General: No swelling.     Cervical back: No rigidity.   Skin:    General: Skin is warm.   Neurological:     Mental Status: She is alert and oriented to person, place, and time. Mental status is at baseline.   Psychiatric:        Mood and Affect: Mood normal.     (all labs ordered are listed, but only abnormal results are displayed) Labs Reviewed  CBC WITH DIFFERENTIAL/PLATELET - Abnormal; Notable for the following components:      Result Value   WBC 11.5 (*)    All other components within normal limits  BASIC METABOLIC PANEL WITH GFR  BRAIN NATRIURETIC PEPTIDE  TROPONIN I (HIGH SENSITIVITY)    EKG: EKG Interpretation Date/Time:  Wednesday July 10 2023 09:17:09 EDT Ventricular Rate:  56 PR Interval:    QRS Duration:  97 QT Interval:  465 QTC Calculation: 449 R Axis:   78  Text Interpretation: Junctional rhythm Probable left ventricular hypertrophy ST depr, consider ischemia, inferior leads Confirmed by Bari Flank [phone]) on 07/10/2023 9:23:44 AM  Radiology: No results found.   .Critical Care  Performed by: Bari Flank HERO, DO Authorized by: Bari Flank HERO, DO   Critical care provider statement:    Critical care time (minutes):  30   Critical care time was exclusive of:  Separately billable procedures and treating other patients   Critical care was necessary to treat or prevent imminent or life-threatening deterioration of the following conditions:  Cardiac failure   Critical care was time spent personally by me on the following activities:  Development of treatment plan with patient or surrogate, discussions with consultants, evaluation of patient's response to treatment, examination of patient, ordering and review of laboratory studies, ordering and review of radiographic studies,  ordering and performing treatments and interventions, pulse oximetry, re-evaluation of patient's condition and review of old charts   I assumed direction of critical care for this patient from another provider in my specialty: no     Care discussed with: admitting provider      Medications Ordered in the ED - No data to display                                  Medical Decision Making Amount and/or Complexity of Data Reviewed Labs: ordered. Radiology: ordered.  Risk Prescription drug management.   89 year old female presents to the emergency department with concern for shortness of breath.  Developed last night, present overnight and worse this morning.  Some mild  chest heaviness but no sharp chest/back pain.  No leg swelling.  Recent echocardiogram with EF of 70-73% with diastolic dysfunction and severe concentric left ventricular hypertrophy.  Vitals are stable on arrival.  Patient is on room air with appropriate oxygen saturation but at times tachypneic.  Systolic murmur on exam with scattered Rales at bases.  EKG shows slight ST depression/peaked T waves, somewhat present before.  Chest x-ray shows bilateral perihilar infiltrate versus edema.  Blood work is concerning for uptrending troponin of 112, up from her baseline in the 20s as well as an elevated BNP over 1600.  She also has mild hyperkalemia at 5.7 and a mild AKI with a creatinine of 1.3.  Small IV fluid bolus was given for correction.  Concern for progression to heart failure.  Consult to cardiology placed.  Spoke with on-call cardiologist, Dr. Wadie.  We reviewed the patient's current workup.  Will plan for medical admission, trend troponins, Lasix  for diuresis.  Patient and family agree with plan.  Patients evaluation and results requires admission for further treatment and care.  Spoke with hospitalist, reviewed patient's ED course and they accept admission.  Patient agrees with admission plan, offers no new complaints and  is stable/unchanged at time of admit.     Final diagnoses:  None    ED Discharge Orders     None          Bari Roxie HERO, DO 07/10/23 1148

## 2023-07-10 NOTE — H&P (Signed)
 History and Physical    Patient: Donna [name] FMW:[id number] DOB: 1927/08/26 DOA: 07/10/2023 DOS: the patient was seen and examined on 07/10/2023 PCP: [name], NP  Patient coming from: Home  Chief Complaint: No chief complaint on file.  HPI: Donna [name] is a 89 y.o. female with medical history significant of osteoarthritis, GI bleed, hypertension, migraine headaches, mild obstructive sleep apnea,  thyroid  cyst, hypothyroidism, hyponatremia, history of SVT, aortic stenosis, severe mitral regurgitation, grade 2 diastolic dysfunction with preserved EF who presented to the emergency department complaints of dyspnea which started last night, no PND or orthopnea. No lower extremity edema. She ate some salty chips yesterday, but he was not a full bag. She does not drink a lot of water or fluids.He denied fever, chills, rhinorrhea, sore throat, wheezing or hemoptysis.  No chest pain, palpitations, diaphoresis. No abdominal pain, nausea, emesis, diarrhea, constipation, melena or hematochezia.  No flank pain, dysuria, frequency or hematuria. No polyuria, polydipsia, polyphagia or blurred vision.   Lab work: CBC showed white count 11.5, hemoglobin 13.2 g/dL platelets 843.  Troponin was 212 and then 126 ng/L.  BNP 1688 pg/mL.  BMP shows sodium 133, potassium 5.7, chloride 104 and CO2 20 mmol/L.  Glucose 159, BUN 21, creatinine 1.31 and calcium  9.5 mg/dL.  Imaging: Portable 1 view chest radiograph showing normal heart size and mediastinal contours, worsening perihilar and basilar infiltrates or edema left greater than right.  Aortic atherosclerosis.  ED course: Initial vital signs temperature 97.9 F, pulse 66, respirations 16, BP 100/49 mmHg O2 sat 97% on room air.  The patient received 500 mL normal saline bolus x 2 and furosemide  20 mg IVP.   Review of Systems: As mentioned in the history of present illness. All other systems reviewed and are negative. Past Medical History:  Diagnosis Date    Aortic stenosis    Hemorrhage of gastrointestinal tract, unspecified    Hypertension    Migraine    Mitral regurgitation    OA (osteoarthritis)    Thyroid  cyst    Past Surgical History:  Procedure Laterality Date   CATARACT EXTRACTION  1990   rt   CATARACT EXTRACTION  2005   left   LEFT HEART CATH AND CORONARY ANGIOGRAPHY N/A 10/06/2019   Procedure: LEFT HEART CATH AND CORONARY ANGIOGRAPHY;  Surgeon: Elmira Newman PARAS, MD;  Location: [facility] INVASIVE CV LAB;  Service: Cardiovascular;  Laterality: N/A;   REVISION TOTAL KNEE ARTHROPLASTY  2008   SVT ABLATION N/A 01/18/2020   Procedure: SVT ABLATION;  Surgeon: Waddell Danelle ORN, MD;  Location: [facility] INVASIVE CV LAB;  Service: Cardiovascular;  Laterality: N/A;   THUMB ARTHROSCOPY     TONSILLECTOMY     Social History:  reports that she quit smoking about 75 years ago. Her smoking use included cigarettes. She started smoking about 77 years ago. She has never used smokeless tobacco. She reports that she does not currently use alcohol. She reports that she does not use drugs.  No Known Allergies  Family History  Problem Relation Age of Onset   CVA Father 23   Pancreatic cancer Mother    CVA Sister 21   Brain cancer Brother    Arthritis Sister    Heart disease Sister    Breast cancer Neg Hx     Prior to Admission medications   Medication Sig Start Date End Date Taking? Authorizing Provider  Blood Pressure Monitoring KIT Essential hypertension check bp daily 08/20/19   Cloria, Tiffany L, DO  Calcium  Carbonate-Vitamin D  600-400 MG-UNIT per tablet Take 1 tablet by mouth. At lunch and at bedtime    [provider]  diclofenac  Sodium (VOLTAREN ) 1 % GEL Apply 4 g topically 4 (four) times daily as needed. 03/05/22   [name], NP  hydrALAZINE  (APRESOLINE ) 50 MG tablet TAKE 1 TABLET BY MOUTH 3 TIMES A DAY 06/17/23   Eubanks, Jessica K, NP  levothyroxine  (SYNTHROID ) 100 MCG tablet TAKE 1 TABLET BY MOUTH DAILY 30 MINUTES BEFORE BREAKFAST ON  AN EMPTY STOMACH 05/07/23   [name], NP  Light Mineral Oil-Mineral Oil (RETAINE MGD) 0.5-0.5 % EMUL Place 1 drop into both eyes in the morning and at bedtime.    [provider]  losartan  (COZAAR ) 100 MG tablet TAKE 1 TABLET(100 MG) BY MOUTH EVERY MORNING 12/11/22   Tolia, Sunit, DO  meclizine  (ANTIVERT ) 12.5 MG tablet Take 1 tablet (12.5 mg total) by mouth 3 (three) times daily as needed for dizziness. 05/14/23   Medina-Vargas, Monina C, NP  metoprolol  succinate (TOPROL -XL) 100 MG 24 hr tablet Take 100 mg by mouth daily. Take with or immediately following a meal.    [provider]  Multiple Vitamins-Minerals (PRESERVISION AREDS 2) CAPS Take 1 capsule by mouth 2 (two) times a day.     [provider]  OVER THE COUNTER MEDICATION Take 2 capsules by mouth daily. MitoQ    [provider]  vitamin B-12 (CYANOCOBALAMIN ) 1000 MCG tablet Take 1 tablet (1,000 mcg total) by mouth daily. 03/07/17   Cloria Annabella CROME, DO    Physical Exam: Vitals:   07/10/23 0831 07/10/23 0835 07/10/23 1045  BP:  (!) 100/49 (!) 106/44  Pulse:  66 (!) 50  Resp:  16 12  Temp: 97.9 F (36.6 C)    SpO2:  97% 100%   Physical Exam Vitals and nursing note reviewed.  Constitutional:      General: She is awake.     Appearance: Normal appearance. She is ill-appearing.  HENT:     Head: Normocephalic.     Nose: No rhinorrhea.     Mouth/Throat:     Mouth: Mucous membranes are moist.   Eyes:     General: No scleral icterus.    Pupils: Pupils are equal, round, and reactive to light.   Neck:     Vascular: No JVD.   Cardiovascular:     Rate and Rhythm: Normal rate and regular rhythm.     Heart sounds: S1 normal and S2 normal.  Pulmonary:     Effort: Pulmonary effort is normal.     Breath sounds: Normal breath sounds. No wheezing or rales.  Abdominal:     General: There is no distension.     Palpations: Abdomen is soft.     Tenderness: There is no abdominal tenderness.  There is no right CVA tenderness or left CVA tenderness.   Musculoskeletal:     Cervical back: Neck supple.     Right lower leg: No edema.     Left lower leg: No edema.   Skin:    General: Skin is warm and dry.   Neurological:     General: No focal deficit present.     Mental Status: She is alert and oriented to person, place, and time.   Psychiatric:        Mood and Affect: Mood normal.        Behavior: Behavior normal. Behavior is cooperative.     Data Reviewed:  Results are  pending, will review when available. 06/22/2023 echocardiogram results. IMPRESSIONS:   1. There is severe concentric LVH with SAM and midcavity obliteration  consistent with hypertophic CM. LVOT gradient at rest with  Valsalva. Left ventricular ejection fraction, by estimation, is 70 to 75%.  Left ventricular ejection fraction  by 3D volume is 73 %. The left ventricle has hyperdynamic function. The  left ventricle has no regional wall motion abnormalities. There is severe  concentric left ventricular hypertrophy. Left ventricular diastolic  parameters are consistent with Grade II  diastolic dysfunction (pseudonormalization).   2. Right ventricular systolic function is normal. The right ventricular  size is normal. There is mildly elevated pulmonary artery systolic  pressure. The estimated right ventricular systolic pressure is 42.6 mmHg.   3. Left atrial size was severely dilated.   4. Right atrial size was moderately dilated.   5. SAM is present. The mitral valve is degenerative. Moderate mitral  valve regurgitation. No evidence of mitral stenosis. Moderate mitral  annular calcification.   6. The aortic valve is tricuspid. There is moderate calcification of the  aortic valve. There is moderate thickening of the aortic valve. Aortic  valve regurgitation is mild. Moderate aortic valve stenosis. Aortic valve  area, by VTI measures 1.38 cm.  Aortic valve mean gradient measures 21.8  mmHg. Aortic valve Vmax measures  3.16 m/s.   7. The inferior vena cava is normal in size with <50% respiratory  variability, suggesting right atrial pressure of 8 mmHg.   EKG: Vent. rate 56 BPM PR interval * ms QRS duration 97 ms QT/QTcB 465/449 ms P-R-T axes * 78 85 Junctional rhythm Probable left ventricular hypertrophy ST depr, consider ischemia, inferior leads  Assessment and Plan: Principal Problem:   Acute on chronic diastolic congestive heart failure (HCC) Currently having:   Hypotension Observation/PCU. As needed supplemental oxygen Sodium and fluid restriction. Hold furosemide  20 mg IVP daily. Albumin 25 g IVPB x 1. Midodrine 5 mg p.o. x 1. Monitor daily weights, intake and output. Hold antihypertensives. Check echocardiogram in a.m. Cardiology will be seeing her in the morning.  Active Problems:   Essential hypertension, benign Hold antihypertensives.    Mitral regurgitation and aortic stenosis   Severe mitral regurgitation Cardiology consult requested for a.m.    SVT (supraventricular tachycardia) (HCC) Currently bradycardic and hypotensive. Midodrine 5 mg p.o. x 1 and glucagon 1 mg IVP given. Resume beta-blocker once hypotension resolved.    Hypothyroidism Continue levothyroxine  100 mcg p.o. daily.   Advance Care Planning:   Code Status: Limited: Do not attempt resuscitation (DNR) -DNR-LIMITED -Do Not Intubate/DNI    Consults:   Family Communication:   Severity of Illness: The appropriate patient status for this patient is OBSERVATION. Observation status is judged to be reasonable and necessary in order to provide the required intensity of service to ensure the patient's safety. The patient's presenting symptoms, physical exam findings, and initial radiographic and laboratory data in the context of their medical condition is felt to place them at decreased risk for further clinical deterioration. Furthermore, it is anticipated that the patient will  be medically stable for discharge from the hospital within 2 midnights of admission.   Author: Alm Dorn Castor, MD 07/10/2023 11:51 AM  For on call review [email or web address].   This document was prepared using Dragon voice recognition software and may contain some unintended transcription errors.

## 2023-07-10 NOTE — ED Triage Notes (Signed)
 Pt arrived reporting shob that started last night. Denies recent illness or know exposure to. Patient also endorses posterior neck pain that started this morning, no injury. Denies any cp, dizziness or any other symptoms.

## 2023-07-11 ENCOUNTER — Observation Stay (HOSPITAL_COMMUNITY)

## 2023-07-11 DIAGNOSIS — I471 Supraventricular tachycardia, unspecified: Secondary | ICD-10-CM | POA: Diagnosis present

## 2023-07-11 DIAGNOSIS — I2489 Other forms of acute ischemic heart disease: Secondary | ICD-10-CM | POA: Diagnosis present

## 2023-07-11 DIAGNOSIS — I5033 Acute on chronic diastolic (congestive) heart failure: Secondary | ICD-10-CM

## 2023-07-11 DIAGNOSIS — I352 Nonrheumatic aortic (valve) stenosis with insufficiency: Secondary | ICD-10-CM | POA: Diagnosis not present

## 2023-07-11 DIAGNOSIS — Z66 Do not resuscitate: Secondary | ICD-10-CM | POA: Diagnosis present

## 2023-07-11 DIAGNOSIS — G4733 Obstructive sleep apnea (adult) (pediatric): Secondary | ICD-10-CM | POA: Diagnosis present

## 2023-07-11 DIAGNOSIS — I959 Hypotension, unspecified: Secondary | ICD-10-CM | POA: Diagnosis present

## 2023-07-11 DIAGNOSIS — E871 Hypo-osmolality and hyponatremia: Secondary | ICD-10-CM | POA: Diagnosis present

## 2023-07-11 DIAGNOSIS — E039 Hypothyroidism, unspecified: Secondary | ICD-10-CM | POA: Diagnosis present

## 2023-07-11 DIAGNOSIS — N179 Acute kidney failure, unspecified: Secondary | ICD-10-CM | POA: Diagnosis not present

## 2023-07-11 DIAGNOSIS — Z823 Family history of stroke: Secondary | ICD-10-CM | POA: Diagnosis not present

## 2023-07-11 DIAGNOSIS — I2721 Secondary pulmonary arterial hypertension: Secondary | ICD-10-CM | POA: Diagnosis present

## 2023-07-11 DIAGNOSIS — I130 Hypertensive heart and chronic kidney disease with heart failure and stage 1 through stage 4 chronic kidney disease, or unspecified chronic kidney disease: Secondary | ICD-10-CM | POA: Diagnosis present

## 2023-07-11 DIAGNOSIS — I421 Obstructive hypertrophic cardiomyopathy: Secondary | ICD-10-CM | POA: Diagnosis present

## 2023-07-11 DIAGNOSIS — Z8261 Family history of arthritis: Secondary | ICD-10-CM | POA: Diagnosis not present

## 2023-07-11 DIAGNOSIS — I340 Nonrheumatic mitral (valve) insufficiency: Secondary | ICD-10-CM

## 2023-07-11 DIAGNOSIS — E875 Hyperkalemia: Secondary | ICD-10-CM | POA: Diagnosis present

## 2023-07-11 DIAGNOSIS — Z800 Family history of malignant neoplasm of digestive organs: Secondary | ICD-10-CM | POA: Diagnosis not present

## 2023-07-11 DIAGNOSIS — R7989 Other specified abnormal findings of blood chemistry: Secondary | ICD-10-CM

## 2023-07-11 DIAGNOSIS — Z79890 Hormone replacement therapy (postmenopausal): Secondary | ICD-10-CM | POA: Diagnosis not present

## 2023-07-11 DIAGNOSIS — Z808 Family history of malignant neoplasm of other organs or systems: Secondary | ICD-10-CM | POA: Diagnosis not present

## 2023-07-11 DIAGNOSIS — I081 Rheumatic disorders of both mitral and tricuspid valves: Secondary | ICD-10-CM | POA: Diagnosis present

## 2023-07-11 DIAGNOSIS — Z79899 Other long term (current) drug therapy: Secondary | ICD-10-CM | POA: Diagnosis not present

## 2023-07-11 DIAGNOSIS — Z87891 Personal history of nicotine dependence: Secondary | ICD-10-CM | POA: Diagnosis not present

## 2023-07-11 DIAGNOSIS — Z8249 Family history of ischemic heart disease and other diseases of the circulatory system: Secondary | ICD-10-CM | POA: Diagnosis not present

## 2023-07-11 DIAGNOSIS — N1832 Chronic kidney disease, stage 3b: Secondary | ICD-10-CM | POA: Diagnosis present

## 2023-07-11 DIAGNOSIS — I080 Rheumatic disorders of both mitral and aortic valves: Secondary | ICD-10-CM | POA: Diagnosis present

## 2023-07-11 DIAGNOSIS — R0602 Shortness of breath: Secondary | ICD-10-CM | POA: Diagnosis present

## 2023-07-11 DIAGNOSIS — I498 Other specified cardiac arrhythmias: Secondary | ICD-10-CM

## 2023-07-11 DIAGNOSIS — I10 Essential (primary) hypertension: Secondary | ICD-10-CM

## 2023-07-11 LAB — ECHOCARDIOGRAM COMPLETE
AR max vel: 0.68 cm2
AV Area VTI: 0.69 cm2
AV Area mean vel: 0.65 cm2
AV Mean grad: 22 mmHg
AV Peak grad: 44.9 mmHg
Ao pk vel: 3.35 m/s
Area-P 1/2: 4 cm2
Calc EF: 66.6 %
Est EF: >75
Height: 62 in
MV M vel: 5.99 m/s
MV Peak grad: 143.5 mmHg
MV VTI: 1.27 cm2
P 1/2 time: 403 ms
S' Lateral: 1.8 cm
Single Plane A2C EF: 67.1 %
Single Plane A4C EF: 67.8 %
Weight: 2271.62 [oz_av]

## 2023-07-11 LAB — RENAL FUNCTION PANEL
Albumin: 3.7 g/dL (ref 3.5–5.0)
Anion gap: 8 (ref 5–15)
BUN: 34 mg/dL — ABNORMAL HIGH (ref 8–23)
CO2: 20 mmol/L — ABNORMAL LOW (ref 22–32)
Calcium: 9.3 mg/dL (ref 8.9–10.3)
Chloride: 105 mmol/L (ref 98–111)
Creatinine, Ser: 1.81 mg/dL — ABNORMAL HIGH (ref 0.44–1.00)
GFR, Estimated: 26 mL/min — ABNORMAL LOW (ref >60)
Glucose, Bld: 123 mg/dL — ABNORMAL HIGH (ref 70–99)
Phosphorus: 3.4 mg/dL (ref 2.5–4.6)
Potassium: 5.6 mmol/L — ABNORMAL HIGH (ref 3.5–5.1)
Sodium: 133 mmol/L — ABNORMAL LOW (ref 135–145)

## 2023-07-11 MED ORDER — METOPROLOL SUCCINATE ER 50 MG PO TB24
50.0000 mg | ORAL_TABLET | Freq: Every day | ORAL | Status: DC
  Administered 2023-07-11 – 2023-07-12 (×2): 50 mg via ORAL
  Filled 2023-07-11 (×2): qty 1

## 2023-07-11 MED ORDER — FUROSEMIDE 20 MG PO TABS
20.0000 mg | ORAL_TABLET | Freq: Every day | ORAL | Status: DC
  Administered 2023-07-12 – 2023-07-13 (×2): 20 mg via ORAL
  Filled 2023-07-11 (×2): qty 1

## 2023-07-11 MED ORDER — FUROSEMIDE 10 MG/ML IJ SOLN
20.0000 mg | Freq: Once | INTRAMUSCULAR | Status: AC
  Administered 2023-07-11: 20 mg via INTRAVENOUS
  Filled 2023-07-11: qty 2

## 2023-07-11 NOTE — Consult Note (Addendum)
 Cardiology Consultation   Patient ID: Donna [name] MRN: [id number]; DOB: 04/21/1927  Admit date: 07/10/2023 Date of Consult: 07/11/2023  PCP:  [name], NP   [facility] Providers Cardiologist:  Madonna Large, DO       Patient Profile: Donna [name] is a 89 y.o. female with a hx of hypertrophic cardiomyopathy, OSA not on CPAP, SVT, moderate aortic stenosis, mild to moderate aortic stenosis, moderate MR, hypertension, and GI bleeding who is being seen 07/11/2023 for the evaluation of heart failure at the request of Dahal Binaya.  History of Present Illness: [name] is a 89 year old female who was initially referred to cardiology for palpitations and shortness of breath.  A nuclear stress test with Lexiscan  was ordered and showed nonspecific perfusion defects.  On 09/2019 she had a follow-up cardiac catheterization that showed minimal luminal irregularities in the LAD and was otherwise completely normal. She was found to have PSVT and underwent an ablation with EP.  After this ablation her palpitations have significantly improved.  On echocardiogram in 2021 she was noted to have LVH, mild aortic valve stenosis, moderate aortic regurgitation, severe mitral regurgitation, and moderate tricuspid regurgitation.  These have been followed with repeat echocardiograms.  Her last follow-up was with Dr. Tolea on 11/2022 at this follow-up she denied any anginal chest pain, orthopnea, PND, near-syncope, or syncope.  TTE on 06/21/2023 showed hyperdynamic LVEF of 70 to 75%, severe concentric LVH, systolic anterior motion of the mitral valve, LVOT gradient of 22 mmHg at rest that increases to 56 mmHg with Valsalva, G2DD, severely dilated left atrium, moderately dilated right atrium, moderate mitral regurgitation, moderate MAC, and moderate aortic valve stenosis with an area estimated at 1.38 cm.  Complete results are on relevant CV studies below.  The patient declined a referral to the HOCM  clinic.  This is reasonable given the patient's age.  On 07/10/2023 she presented to the emergency department for shortness of breath, PND, and orthopnea.  Her shortness of breath started the previous day prior to going to bed and got worse overnight.  On interview the patient reports that she did not notice any shortness of breath prior to the day before coming to the emergency department.  She feels like her shortness of breath has improved since yesterday but still has shortness of breath and orthopnea. At home she goes for short walks on her driveway.  She gets assistance with housework and yard work and is unable to do more than 4 metabolic equivalents of exertion.  She has had these limitations for many years. denies any chest pain, nausea, vomiting, lower extremity edema, fever, and chills.  Denies alcohol use, tobacco use, and illicit substance use.  Labs showed elevated BNP of 1687, elevated high-sensitivity troponins 112> 126, elevated potassium of 5.6, creatinine of 1.81 up from 0.76 six days ago, elevated BUN of 34.  On 07/10/2023 leukocytes were slightly elevated at 11.5 and hemoglobin was normal at 13.2.  Chest x-ray showed worsening perihilar and basilar infiltrates or edema.  EKG showed Junctional rhythm with a rate of 56, anterior ST elevation not meeting criteria for STEMI, inferior and lateral ST depression that are not present on prior EKG on 07/06/23. Prior EKG showed anterior ST elevation, and sinus rhythm.  Initially her blood pressure was hypotensive but over the past day has become hypertensive.  Past Medical History:  Diagnosis Date   Aortic stenosis    Hemorrhage of gastrointestinal tract, unspecified    Hypertension  Migraine    Mitral regurgitation    OA (osteoarthritis)    Thyroid  cyst     Past Surgical History:  Procedure Laterality Date   CATARACT EXTRACTION  1990   rt   CATARACT EXTRACTION  2005   left   LEFT HEART CATH AND CORONARY ANGIOGRAPHY N/A 10/06/2019    Procedure: LEFT HEART CATH AND CORONARY ANGIOGRAPHY;  Surgeon: Elmira Newman PARAS, MD;  Location: [facility];  Service: Cardiovascular;  Laterality: N/A;   REVISION TOTAL KNEE ARTHROPLASTY  2008   SVT ABLATION N/A 01/18/2020   Procedure: SVT ABLATION;  Surgeon: Waddell Danelle ORN, MD;  Location: [facility];  Service: Cardiovascular;  Laterality: N/A;   THUMB ARTHROSCOPY     TONSILLECTOMY       Home Medications:  Prior to Admission medications   Medication Sig Start Date End Date Taking? Authorizing Provider  Calcium  Carbonate-Vitamin D  600-400 MG-UNIT per tablet Take 1 tablet by mouth. At lunch and at bedtime   Yes [provider]  diclofenac  Sodium (VOLTAREN ) 1 % GEL Apply 4 g topically 4 (four) times daily as needed. Patient taking differently: Apply 1 Application topically daily as needed (pain). 03/05/22  Yes [name], NP  hydrALAZINE  (APRESOLINE ) 50 MG tablet TAKE 1 TABLET BY MOUTH 3 TIMES A DAY 06/17/23  Yes Eubanks, Jessica K, NP  levothyroxine  (SYNTHROID ) 100 MCG tablet TAKE 1 TABLET BY MOUTH DAILY 30 MINUTES BEFORE BREAKFAST ON AN EMPTY STOMACH 05/07/23  Yes [name], NP  Light Mineral Oil-Mineral Oil (RETAINE MGD) 0.5-0.5 % EMUL Place 1 drop into both eyes in the morning and at bedtime.   Yes [provider]  losartan  (COZAAR ) 100 MG tablet TAKE 1 TABLET(100 MG) BY MOUTH EVERY MORNING 12/11/22  Yes Tolia, Sunit, DO  metoprolol  succinate (TOPROL -XL) 50 MG 24 hr tablet Take 50 mg by mouth daily. Take with or immediately following a meal.   Yes [provider]  Multiple Vitamins-Minerals (PRESERVISION AREDS 2) CAPS Take 1 capsule by mouth daily.   Yes [provider]  OVER THE COUNTER MEDICATION Take 2 capsules by mouth daily. MitoQ   Yes [provider]  vitamin B-12 (CYANOCOBALAMIN ) 1000 MCG tablet Take 1 tablet (1,000 mcg total) by mouth daily. 03/07/17  Yes Reed, Tiffany L, DO  meclizine  (ANTIVERT ) 12.5 MG  tablet Take 1 tablet (12.5 mg total) by mouth 3 (three) times daily as needed for dizziness. Patient not taking: Reported on 07/10/2023 05/14/23   Medina-Vargas, Monina C, NP    Scheduled Meds:  enoxaparin  (LOVENOX ) injection  30 mg Subcutaneous Q24H   levothyroxine   100 mcg Oral Q1200   Continuous Infusions:  PRN Meds: acetaminophen  **OR** acetaminophen , ondansetron  **OR** ondansetron  (ZOFRAN ) IV  Allergies:   No Known Allergies  Social History:   Social History   Socioeconomic History   Marital status: Widowed    Spouse name: Not on file   Number of children: 2   Years of education: 61   Highest education level: Not on file  Occupational History   Not on file  Tobacco Use   Smoking status: Former    Current packs/day: 0.00    Types: Cigarettes    Start date: 01/15/1946    Quit date: 01/16/1948    Years since quitting: 75.5   Smokeless tobacco: Never   Tobacco comments:    smoked on occ when she was a teen  Vaping Use   Vaping status: Never Used  Substance and Sexual  Activity   Alcohol use: Not Currently   Drug use: No   Sexual activity: Not on file  Other Topics Concern   Not on file  Social History Narrative   Right handed   Decaf coffee, tea sometimes   Lives alone   Social Drivers of Health   Financial Resource Strain: Low Risk  (09/11/2017)   Overall Financial Resource Strain (CARDIA)    Difficulty of Paying Living Expenses: Not hard at all  Food Insecurity: No Food Insecurity (07/10/2023)   Hunger Vital Sign    Worried About Running Out of Food in the Last Year: Never true    Ran Out of Food in the Last Year: Never true  Transportation Needs: No Transportation Needs (07/10/2023)   PRAPARE - Administrator, Civil Service (Medical): No    Lack of Transportation (Non-Medical): No  Physical Activity: Inactive (09/11/2017)   Exercise Vital Sign    Days of Exercise per Week: 0 days    Minutes of Exercise per Session: 0 min  Stress: Stress Concern  Present (09/11/2017)   [name] of Occupational Health - Occupational Stress Questionnaire    Feeling of Stress : To some extent  Social Connections: Moderately Isolated (07/10/2023)   Social Connection and Isolation Panel    Frequency of Communication with Friends and Family: More than three times a week    Frequency of Social Gatherings with Friends and Family: More than three times a week    Attends Religious Services: More than 4 times per year    Active Member of [employer] or Organizations: No    Attends Banker Meetings: Never    Marital Status: Widowed  Intimate Partner Violence: Not At Risk (07/10/2023)   Humiliation, Afraid, Rape, and Kick questionnaire    Fear of Current or Ex-Partner: No    Emotionally Abused: No    Physically Abused: No    Sexually Abused: No    Family History:    Family History  Problem Relation Age of Onset   CVA Father 28   Pancreatic cancer Mother    CVA Sister 72   Brain cancer Brother    Arthritis Sister    Heart disease Sister    Breast cancer Neg Hx      ROS:  Please see the history of present illness.   All other ROS reviewed and negative.     Physical Exam/Data: Vitals:   07/10/23 2334 07/11/23 0443 07/11/23 0500 07/11/23 0549  BP: 135/60 (!) 172/66  (!) 156/50  Pulse: 61 82    Resp: 16 19    Temp: 97.9 F (36.6 C) 97.8 F (36.6 C)    TempSrc: Oral Oral    SpO2: 93% 93%    Weight:   64.4 kg   Height:        Intake/Output Summary (Last 24 hours) at 07/11/2023 0717 Last data filed at 07/10/2023 1821 Gross per 24 hour  Intake 186.76 ml  Output --  Net 186.76 ml      07/11/2023    5:00 AM 07/05/2023    8:13 AM 05/14/2023    3:30 PM  Last 3 Weights  Weight (lbs) 141 lb 15.6 oz 140 lb 141 lb 9.6 oz  Weight (kg) 64.4 kg 63.504 kg 64.229 kg     Body mass index is 25.97 kg/m.  General:  Well nourished, well developed, in no acute distress, appearing younger than stated age, on room air, alert and  orientated. HEENT: normal Neck:  Positive JVD up to the jaw. Vascular: No carotid bruits; Distal pulses 2+ bilaterally Cardiac:  normal S1, S2; RRR; 3 out of 6 systolic murmur Lungs: Inspiratory crackles heard on bilateral lower lung lobes. Abd: soft, nontender, no hepatomegaly  Ext: 2+ bilateral lower extremity edema Musculoskeletal:  No deformities. Skin: warm and dry  Neuro:   no focal abnormalities noted Psych:  Normal affect   EKG:  The EKG was personally reviewed and demonstrates:  EKG showed Junctional rhythm with a rate of 56, anterior ST elevation not meeting criteria for STEMI, inferior and lateral ST depression that are not present on prior EKG on 07/06/23. Prior EKG showed anterior ST elevation, and sinus rhythm.  Telemetry:  Telemetry was personally reviewed and demonstrates: Normal sinus rhythm with rates in the 50s to 70s.  There are episodes of junctional rhythm as was seen on the EKG.   Relevant CV Studies:  TTE on 06/21/2023 IMPRESSIONS     1. There is severe concentric LVH with SAM and midcavity obliteration  consistent with hypertophic CM. LVOT gradient at rest with  Valsalva. Left ventricular ejection fraction, by estimation, is 70 to 75%.  Left ventricular ejection fraction  by 3D volume is 73 %. The left ventricle has hyperdynamic function. The  left ventricle has no regional wall motion abnormalities. There is severe  concentric left ventricular hypertrophy. Left ventricular diastolic  parameters are consistent with Grade II  diastolic dysfunction (pseudonormalization).   2. Right ventricular systolic function is normal. The right ventricular  size is normal. There is mildly elevated pulmonary artery systolic  pressure. The estimated right ventricular systolic pressure is 42.6 mmHg.   3. Left atrial size was severely dilated.   4. Right atrial size was moderately dilated.   5. SAM is present. The mitral valve is degenerative. Moderate mitral   valve regurgitation. No evidence of mitral stenosis. Moderate mitral  annular calcification.   6. The aortic valve is tricuspid. There is moderate calcification of the  aortic valve. There is moderate thickening of the aortic valve. Aortic  valve regurgitation is mild. Moderate aortic valve stenosis. Aortic valve  area, by VTI measures 1.38 cm.  Aortic valve mean gradient measures 21.8 mmHg. Aortic valve Vmax measures  3.16 m/s.   7. The inferior vena cava is normal in size with <50% respiratory  variability, suggesting right atrial pressure of 8 mmHg   Laboratory Data: High Sensitivity Troponin:   Recent Labs  Lab 07/10/23 0908 07/10/23 1116  TROPONINIHS 112* 126*     Chemistry Recent Labs  Lab 07/05/23 0938 07/10/23 0908 07/10/23 1116 07/11/23 0412  NA 139 133*  --  133*  K 4.6 5.7*  --  5.6*  CL 105 104  --  105  CO2 29 20*  --  20*  GLUCOSE 94 159*  --  123*  BUN 14 21  --  34*  CREATININE 0.76 1.31*  --  1.81*  CALCIUM  9.8 9.5  --  9.3  MG  --   --  2.1  --   GFRNONAA  --  38*  --  26*  ANIONGAP  --  9  --  8    Recent Labs  Lab 07/05/23 0938 07/10/23 1116 07/11/23 0412  PROT 5.6* 5.5*  --   ALBUMIN  --  3.5 3.7  AST 12 21  --   ALT 14 18  --   ALKPHOS  --  42  --   BILITOT 0.5 1.0  --  Lipids No results for input(s): CHOL, TRIG, HDL, LABVLDL, LDLCALC, CHOLHDL in the last 168 hours.  Hematology Recent Labs  Lab 07/05/23 0938 07/10/23 0908  WBC 4.3 11.5*  RBC 4.34 4.38  HGB 12.9 13.2  HCT 39.1 40.3  MCV 90.1 92.0  MCH 29.7 30.1  MCHC 33.0 32.8  RDW 12.4 13.3  PLT 111* 156   Thyroid  No results for input(s): TSH, FREET4 in the last 168 hours.  BNP Recent Labs  Lab 07/10/23 0905  BNP 1,687.9*    DDimer No results for input(s): DDIMER in the last 168 hours.  Radiology/Studies:  DG Chest Port 1 View Result Date: 07/10/2023 CLINICAL DATA:  sob EXAM: PORTABLE CHEST - 1 VIEW COMPARISON:  04/06/2022 FINDINGS: Worsening  perihilar and basilar infiltrates or edema, left greater than right. Heart size and mediastinal contours are within normal limits. Aortic Atherosclerosis (ICD10-170.0). No effusion. Visualized bones unremarkable. IMPRESSION: Worsening perihilar and basilar infiltrates or edema. Electronically Signed   By: JONETTA Faes M.D.   On: 07/10/2023 10:05     Assessment and Plan: Aleaha Fickling is a 89 y.o. 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   Acute congestive heart failure Moderate mitral regurgitation Moderate aortic stenosis Mild to moderate aortic regurgitation LVH with LVOT obstruction 22 mmHg gradient at rest, 56 mmHg gradient with Valsalva Hypertension Presented to the emergency department with worsening shortness of breath, PND, and orthopnea that started before going to bed the previous day.  Her medications prior to admission included metoprolol  50 mg daily, losartan  100 mg daily, hydralazine  50 mg 3 times daily. TTE on 06/21/2023 showed hyperdynamic LVEF of 70 to 75%, severe concentric LVH, systolic anterior motion of the mitral valve, LVOT gradient of 22 mmHg at rest that increases to 56 mmHg with Valsalva, G2DD, severely dilated left atrium, moderately dilated right atrium, moderate mitral regurgitation, moderate MAC, and moderate aortic valve stenosis with an area estimated at 1.38 cm.  Complete results are on relevant CV studies below.  The patient declined a referral to the HOCM clinic.  This is reasonable given the patient's age. Chest x-ray showed worsening perihilar and basilar infiltrates or edema.  BNP was elevated at 1687.  When she was initially hospitalized her blood pressure was hypotensive as low as 92/44.  She  received a dose of midodrine, 1 L of IV normal saline, and her home blood pressure medications were held.  Her blood pressure has since increased and is currently 156/50. The patient feels like her shortness of breath has improved since being hospitalized. Will order echocardiogram because of fluid retention and shortness of breath. Strict I's and O's. Start 20 mg oral Lasix  daily.  Will avoid larger doses of Lasix  given LVH with LVOT obstruction Stop PTA losartan  and hydralazine  given she has LVH with LVOT obstruction and symptoms of heart failure. Restart metoprolol  succinate 50 mg daily.  If blood pressures remain elevated will consider adding calcium  channel blocker or increasing beta-blocker.  EKG abnormality Elevated high-sensitivity troponins 112> 126 Denies any chest pain, nausea, vomiting, and diaphoresis.  Has been unable to do more than 4 metabolic equivalents of exertion for many years EKG showed Junctional rhythm with a rate of 56, anterior ST elevation not meeting criteria for STEMI, inferior  and lateral ST depression that are not present on prior EKG on 07/06/23. Prior EKG showed anterior ST elevation, and sinus rhythm. She had a cardiac catheterization performed in 2021 that showed mild luminal irregularities in the LAD but was otherwise normal Initially her blood pressure was hypotensive but over the past day has become hypertensive. I suspect that elevated troponins are secondary to demand ischemia from acute heart failure, LVH with LVOT obstruction, and aortic and mitral regurgitation and stenosis. Echo pending  Otherwise manage per primary     Risk Assessment/Risk Scores:       [place]  Heart Association (NYHA) Functional Class NYHA Class III       For questions or updates, please contact [facility] Please consult [email or web address] for contact info under    Signed, Morse Clause, PA-C  07/11/2023 7:17 AM   I have seen and examined the patient along with  Zane Adams, PA-C .  I have reviewed the chart, notes and new data.  I agree with PA/NP's note.  Key new complaints: unclear what caused decompensation. She did not overindulge in sodium rich foods or skip medications, weight was not changed from baseline. Had some transient lef arm discomfort that resolved spontaneously the day before, no chest discomfort though (although ED provider reports mild chest heaviness. She is still dyspneic. She was given both 2x500 ml bolus of NS and 20 mg of furosemide  IV in ED and also given one dose of midodrine for hypotension. Key examination changes: JVP 8 cm, 1+ pitting pretibial edema, crackles in both lung bases. There is underlying regular rhythm, but very frequent PACs and brief bursts of SVT. Systolic murmurs are heard at the RUSB, LLSB and apex. Because of the frequent changes in rhythm , it's hard to be sure, but the apical murmur appears to worsen with the Valsalva maneuver and diminish with handgrip. No diastolic murmur appreciated. Key new findings / data: SR w frequent PACs and brief nonsustained atrial tachycardia on telemetry, but no true atrial fibrillation. Junctional rhythm with retrograde P waves on yesterday morning's tracing. Elevated BNP and CXR changes of CHF. Minimal elevation in troponin. Creatinine 1.3-->1.8 is above her baseline (usually 0.8). Reviewed echo from 06/21/2023 which shows typical HOCM anatomy, both valvular aortic stenosis and dynamic LV outflow obstruction due to [facility] (hard to tease out relative contribution of these to LV outflow obstruction, but HOCM probably more important), significant MR and elevated left atrial pressure. At 2021 cardiac cath: 60 mmHg mid-cavitary gradient on pullback.  No significant LV-Ao gradient on pullback.  PLAN: She has clear clinical and CXR evidence of hypervolemia. She was hypotensive at presentation (worsening LVOT dynamic gradient?). Immediate cause of decompensation is not clear. No CAD on  cardiac cath at age 9. Consider arrhythmia (junctional rhythm with loss of atrial kick, occult atrial fibrillation, sustained atrial tachycardia?). She is having very frequent atrial ectopy, nonsustained PAT on monitor. Hydralazine  disadvantageous with HOCM. Resume beta blocker, watch for recurrent junctional rhythm.  Give one dose of IV loop diuretic and plan daily PO furosemide . Tolerate SBP 150-160s since her DBP is very low and hypotension is much more dangerous than moderate systolic hypertension considering her pathophysiology.   Jerel Balding, MD, FACC [facility] [phone] 07/11/2023, 12:21 PM

## 2023-07-11 NOTE — Progress Notes (Signed)
*  PRELIMINARY RESULTS* Echocardiogram 2D Echocardiogram has been performed.  Donna [name] 07/11/2023, 4:05 PM

## 2023-07-11 NOTE — Progress Notes (Signed)
 PROGRESS NOTE  Raseel Jans  DOB: 08-28-1927  PCP: [name], NP FMW:[id number]  DOA: 07/10/2023  LOS: 0 days  Hospital Day: 2  Brief narrative: Donna [name] is a 89 y.o. female with PMH significant for hypertrophic cardiomyopathy, HTN, OSA not on CPAP, SVT status post ablation, moderate aortic stenosis, moderate MR, h/o GI bleeding, hyponatremia 6/25, patient presented to the ED with complaint of shortness of breath that started the night before and progressed overnight.  In the ED, she was afebrile, hemodynamically stable Initial labs with WC count 11.5, 133, potassium elevated 5.7, BUN/creatinine 21/1.31 Worsening perihilar and basilar infiltrates or edema.  EKG showed junctional rhythm with a rate of 56, Initially she was hypotensive and was given midodrine.  Lasix  could not be given on admission Admitted to [facility] Cardiology was consulted  Subjective: Patient was seen and examined this morning.  Pleasant elderly Caucasian female. Sitting up in recliner.  Not in distress.  Daughter at bedside. Seen by cardiology later this morning. Chart reviewed Blood pressure elevated to 150s Labs from this morning with sodium 133, potassium elevated to 5.6, BUN/creatinine elevated to 34/1.81.  Assessment and plan: Acute exacerbation of chronic CHF Moderate AS, moderate AR, moderate MR HOCM HTN Presented with shortness of breath, PND, orthopnea that started the previous day  Chest x-ray showed evidence of volume overload. BNP elevated Cardiac consult appreciated. Given 1 dose of IV Lasix  today and plan for oral Lasix . Also continued on metoprolol  50 mg daily. Losartan  and hydralazine  on hold to prevent hypotension which could be detrimental for HOCM. PTA meds metoprolol  50 mg daily, losartan  100 mg daily, hydralazine  50 mg 3 times daily Echocardiogram 6/6 showed severe concentric LVH with SAM and mid cavitary obliteration consistent with hypertrophic cardiomyopathy, LVOT  gradient 22 mm at rest and 56 mm with Valsalva, EF 70 to 75%, grade 2 diastolic dysfunction, RVSP mildly elevated to 43, both atrium dilated, moderate MR, moderate AS Continue to monitor for daily intake output, weight, blood pressure, BNP, renal function and electrolytes. Net IO Since Admission: 466.76 mL [07/11/23 1548] Recent Labs  Lab 07/05/23 0938 07/10/23 0905 07/10/23 0908 07/10/23 1116 07/11/23 0412  BNP  --  1,687.9*  --   --   --   BUN 14  --  21  --  34*  CREATININE 0.76  --  1.31*  --  1.81*  NA 139  --  133*  --  133*  K 4.6  --  5.7*  --  5.6*  MG  --   --   --  2.1  --    SVT On metoprolol    Hypothyroidism Continue levothyroxine  100 mcg p.o. daily.   Mobility: Encourage ambulation.  Obtain PT eval  Goals of care   Code Status: Limited: Do not attempt resuscitation (DNR) -DNR-LIMITED -Do Not Intubate/DNI      DVT prophylaxis:  enoxaparin  (LOVENOX ) injection 30 mg Start: 07/10/23 2200   Antimicrobials: None Fluid: None Consultants: Cardiology Family Communication: Daughter at bedside  Status: Inpatient Level of care:  Progressive   Patient is from: Home Needs to continue in-hospital care: Needs diuresis and further monitoring Anticipated d/c to: Hopefully home in 1 to 2 days      Diet:  Diet Order             Diet Heart Room service appropriate? Yes; Fluid consistency: Thin; Fluid restriction: 1200 mL Fluid  Diet effective now  Scheduled Meds:  enoxaparin  (LOVENOX ) injection  30 mg Subcutaneous Q24H   furosemide   20 mg Oral Daily   levothyroxine   100 mcg Oral Q1200   metoprolol  succinate  50 mg Oral Daily    PRN meds: acetaminophen  **OR** acetaminophen , ondansetron  **OR** ondansetron  (ZOFRAN ) IV   Infusions:    Antimicrobials: Anti-infectives (From admission, onward)    None       Objective: Vitals:   07/11/23 0549 07/11/23 1249  BP: (!) 156/50 (!) 158/71  Pulse:  91  Resp:  18  Temp:  98.2 F  (36.8 C)  SpO2:  95%    Intake/Output Summary (Last 24 hours) at 07/11/2023 1548 Last data filed at 07/11/2023 1455 Gross per 24 hour  Intake 666.76 ml  Output 200 ml  Net 466.76 ml   Filed Weights   07/11/23 0500  Weight: 64.4 kg   Weight change:  Body mass index is 25.97 kg/m.   Physical Exam: General exam: Pleasant, elderly Caucasian female Skin: No rashes, lesions or ulcers. HEENT: Atraumatic, normocephalic, no obvious bleeding Lungs: Minimal bibasilar crackles CVS: S1, S2, pansystolic murmur GI/Abd: Soft, nontender, nondistended, bowel sound present,   CNS: Alert, awake, oriented x 3 Psychiatry: Mood appropriate,  Extremities: Trace bilateral pedal edema, no calf tenderness,   Data Review: I have personally reviewed the laboratory data and studies available.  F/u labs  Unresulted Labs (From admission, onward)     Start     Ordered   Unscheduled  Basic metabolic panel with GFR  Tomorrow morning,   R        07/11/23 1548   Unscheduled  CBC with Differential/Platelet  Tomorrow morning,   R        07/11/23 1548   Unscheduled  Brain natriuretic peptide  Tomorrow morning,   R        07/11/23 1548            Signed, Chapman Rota, MD [facility] 07/11/2023

## 2023-07-11 NOTE — Progress Notes (Signed)
 Heart Failure Navigator Progress Note  Assessed for Heart & Vascular TOC clinic readiness.  Patient does not meet criteria due to EF 70-75%, has a scheduled [facility] appointment on 08/16/2023. No HF TOC. .   Navigator will sign off at this time. SABRA Stephane Haddock, BSN, RN Heart Failure Teacher, adult education Only

## 2023-07-12 DIAGNOSIS — I5033 Acute on chronic diastolic (congestive) heart failure: Secondary | ICD-10-CM | POA: Diagnosis not present

## 2023-07-12 LAB — CBC WITH DIFFERENTIAL/PLATELET
Abs Immature Granulocytes: 0.28 10*3/uL — ABNORMAL HIGH (ref 0.00–0.07)
Basophils Absolute: 0 10*3/uL (ref 0.0–0.1)
Basophils Relative: 0 %
Eosinophils Absolute: 0 10*3/uL (ref 0.0–0.5)
Eosinophils Relative: 0 %
HCT: 34.1 % — ABNORMAL LOW (ref 36.0–46.0)
Hemoglobin: 11 g/dL — ABNORMAL LOW (ref 12.0–15.0)
Immature Granulocytes: 3 %
Lymphocytes Relative: 14 %
Lymphs Abs: 1.4 10*3/uL (ref 0.7–4.0)
MCH: 29.4 pg (ref 26.0–34.0)
MCHC: 32.3 g/dL (ref 30.0–36.0)
MCV: 91.2 fL (ref 80.0–100.0)
Monocytes Absolute: 2.6 10*3/uL — ABNORMAL HIGH (ref 0.1–1.0)
Monocytes Relative: 26 %
Neutro Abs: 5.9 10*3/uL (ref 1.7–7.7)
Neutrophils Relative %: 57 %
Platelets: 95 10*3/uL — ABNORMAL LOW (ref 150–400)
RBC: 3.74 MIL/uL — ABNORMAL LOW (ref 3.87–5.11)
RDW: 13.3 % (ref 11.5–15.5)
WBC: 10.3 10*3/uL (ref 4.0–10.5)
nRBC: 0 % (ref 0.0–0.2)

## 2023-07-12 LAB — BASIC METABOLIC PANEL WITH GFR
Anion gap: 8 (ref 5–15)
BUN: 41 mg/dL — ABNORMAL HIGH (ref 8–23)
CO2: 22 mmol/L (ref 22–32)
Calcium: 9.3 mg/dL (ref 8.9–10.3)
Chloride: 102 mmol/L (ref 98–111)
Creatinine, Ser: 1.45 mg/dL — ABNORMAL HIGH (ref 0.44–1.00)
GFR, Estimated: 34 mL/min — ABNORMAL LOW (ref >60)
Glucose, Bld: 110 mg/dL — ABNORMAL HIGH (ref 70–99)
Potassium: 4.8 mmol/L (ref 3.5–5.1)
Sodium: 132 mmol/L — ABNORMAL LOW (ref 135–145)

## 2023-07-12 LAB — BRAIN NATRIURETIC PEPTIDE: B Natriuretic Peptide: 1609.8 pg/mL — ABNORMAL HIGH (ref 0.0–100.0)

## 2023-07-12 MED ORDER — METOPROLOL SUCCINATE ER 50 MG PO TB24: 75.0000 mg | ORAL_TABLET | Freq: Every day | ORAL | Status: DC

## 2023-07-12 MED ORDER — METOPROLOL SUCCINATE ER 25 MG PO TB24
25.0000 mg | ORAL_TABLET | Freq: Once | ORAL | Status: AC
  Administered 2023-07-12: 25 mg via ORAL
  Filled 2023-07-12: qty 1

## 2023-07-12 NOTE — Progress Notes (Signed)
 Progress Note  Patient Name: Donna [name] Date of Encounter: 07/12/2023 [facility] Cardiologist: Madonna Large, DO   Interval Summary   She feels much better.  No longer has orthopnea.  Edema has resolved.  Would like to go home.  Vital Signs Vitals:   07/11/23 2112 07/12/23 0426 07/12/23 0500 07/12/23 0900  BP: 127/63 115/69  (!) 120/53  Pulse: 86 86  88  Resp: 18 19  19   Temp: 98.2 F (36.8 C) 99 F (37.2 C)  98.6 F (37 C)  TempSrc: Oral Oral  Oral  SpO2: 90% 90%  92%  Weight:   64.2 kg   Height:        Intake/Output Summary (Last 24 hours) at 07/12/2023 1057 Last data filed at 07/11/2023 2000 Gross per 24 hour  Intake 480 ml  Output 200 ml  Net 280 ml      07/12/2023    5:00 AM 07/11/2023    5:00 AM 07/05/2023    8:13 AM  Last 3 Weights  Weight (lbs) 141 lb 8.6 oz 141 lb 15.6 oz 140 lb  Weight (kg) 64.2 kg 64.4 kg 63.504 kg      Telemetry/ECG  Sinus arrhythmia with occasional PVCs, sometimes in a pattern of bigeminy; occasional brief episodes of SVT, much less than yesterday- Personally Reviewed  Physical Exam  GEN: No acute distress.   Neck: No JVD Cardiac: RRR, the right upper sternal border and left lower sternal border systolic murmurs are much diminished since yesterday, however the apical holosystolic murmur is still 3/6, no diastolic murmurs, rubs, or gallops.  Respiratory: Clear to auscultation bilaterally. GI: Soft, nontender, non-distended  MS: No edema  Weight is unchanged since yesterday at about 141 pounds  Reviewed the echocardiogram which shows hypertrophic obstructive cardiomyopathy with hyperdynamic LV function, systolic anterior motion of the mitral valve and secondary LVOT obstruction as well as severe mitral insufficiency with reversal of systolic flow in the right upper pulmonary vein, limited mean left atrial pressure, moderate pulmonary artery hypertension in the 60s, severely elevated mean right atrial pressure.  The mitral  insufficiency jet is extremely eccentric, posterior wall-hugging.  Assessment & Plan  Markedly improved clinically since yesterday. Echocardiogram suggest that the major hemodynamic abnormalities hypertrophic obstructive cardiomyopathy, but the severity of mitral insufficiency is quite remarkable and it is possible she has developed additional mitral valve pathology (hard to say by transthoracic echocardiogram). She has not had any problems with bradycardia/junctional rhythm after we restarted her beta-blocker and the burden of SVT is substantially improved. Would like to increase the dose of metoprolol  succinate to 75 mg daily.  If her blood pressure becomes low, reduce the losartan  to 50 mg daily..  Hydralazine  has been stopped indefinitely. She would like to go home today, but asked her to be patient at least till we see the effects of the higher dose of beta-blocker.  If she is doing really well this afternoon, without dyspnea, hypotension or excessive bradycardia, then she can be discharged on the higher dose of metoprolol . If she does not tolerate the higher dose of metoprolol  I would like to see her again in AM.  If she is ready for discharge later this afternoon, recommend: metoprolol  succinate 75 mg once daily, losartan  100 mg daily, furosemide  20 mg daily.  No hydralazine  She already has a follow-up visit scheduled 08/16/2023 with Dr. Large.   For questions or updates, please contact [facility] Please consult [email or web address] for contact info under  Signed, Jerel Balding, MD

## 2023-07-12 NOTE — TOC CM/SW Note (Signed)
 Transition of Care [facility]) - Inpatient Brief Assessment   Patient Details  Name: Donna [name] MRN: [id number] Date of Birth: 04/02/1927  Transition of Care [facility]) CM/SW Contact:    Tawni CHRISTELLA Eva, LCSW Phone Number: 07/12/2023, 1:18 PM   Transition of Care Asessment: Insurance and Status: Insurance coverage has been reviewed Patient has primary care physician: Yes Home environment has been reviewed: home with self Prior level of function:: independent Prior/Current Home Services: No current home services Social Drivers of Health Review: SDOH reviewed no interventions necessary Readmission risk has been reviewed: Yes Transition of care needs: no transition of care needs at this time

## 2023-07-12 NOTE — Evaluation (Signed)
 Physical Therapy Evaluation Patient Details Name: Donna [name] MRN: [id number] DOB: 1927/05/17 Today's Date: 07/12/2023  History of Present Illness  Pt is 89 yo female presented on 07/10/23 with shortness of breath.  Pt admitted with acute exacerbation of CHF.  Pt with moderate AS, AR, and MR.  Cardiology consulted and increased metoprolol  - monitoring to make sure tolerates without hypotension or bradycardia.  Pt with hx including but not limited to  hypertrophic cardiomyopathy, HTN, OSA not on CPAP, SVT status post ablation, moderate aortic stenosis, moderate MR, h/o GI bleeding, hyponatremia  Clinical Impression  Pt admitted with above diagnosis. At baseline, pt is independent and ambulates with cane.  She has support at home (daughter is PT) and necessary DME.  Today, pt reports feeling much better.  She was able to stand with supervision and ambulated 250' with RW and CGA for safety. Pt reports mild shortness of breath with activity but all VSS.  Pt expected to progress well and return home with no post-acute PT needs.  Will maintain on caseload acutely to continue to monitor shortness of breath with activity and progress to cane.   Pt currently with functional limitations due to the deficits listed below (see PT Problem List). Pt will benefit from acute skilled PT to increase their independence and safety with mobility to allow discharge.           If plan is discharge home, recommend the following: Assistance with cooking/housework;Help with stairs or ramp for entrance   Can travel by private vehicle        Equipment Recommendations None recommended by PT  Recommendations for Other Services       Functional Status Assessment Patient has had a recent decline in their functional status and demonstrates the ability to make significant improvements in function in a reasonable and predictable amount of time. (mild decline)     Precautions / Restrictions Precautions Precautions: Fall       Mobility  Bed Mobility Overal bed mobility: Modified Independent             General bed mobility comments: in chair at arrival    Transfers Overall transfer level: Needs assistance Equipment used: Rolling walker (2 wheels) Transfers: Sit to/from Stand Sit to Stand: Supervision                Ambulation/Gait Ambulation/Gait assistance: Contact guard assist Gait Distance (Feet): 250 Feet Assistive device: Rolling walker (2 wheels) Gait Pattern/deviations: Step-through pattern Gait velocity: normal     General Gait Details: Steady wtih RW; reports mild shortness of breath but still able to carry on conversation with walking  Stairs            Wheelchair Mobility     Tilt Bed    Modified Rankin (Stroke Patients Only)       Balance Overall balance assessment: Needs assistance Sitting-balance support: No upper extremity supported Sitting balance-Leahy Scale: Good     Standing balance support: No upper extremity supported, Bilateral upper extremity supported Standing balance-Leahy Scale: Fair Standing balance comment: RW to ambulate but could stand without UE support                             Pertinent Vitals/Pain Pain Assessment Pain Assessment: No/denies pain    Home Living Family/patient expects to be discharged to:: Private residence Living Arrangements: Children (dtr and grandson) Available Help at Discharge: Family;Available 24 hours/day Type of Home: House Home  Access: Level entry       Home Layout: One level Home Equipment: Grab bars - tub/shower;Shower seat;Cane - single point;Rollator (4 wheels) Additional Comments: lives w dtr who is outpt PT    Prior Function               Mobility Comments: ambulates with cane; could ambulate in community; denies falls ADLs Comments: independent adls and iadls     Extremity/Trunk Assessment   Upper Extremity Assessment Upper Extremity Assessment: Overall WFL for  tasks assessed    Lower Extremity Assessment Lower Extremity Assessment: Overall WFL for tasks assessed (ROM WFL; MMT 5/5)    Cervical / Trunk Assessment Cervical / Trunk Assessment: Kyphotic  Communication        Cognition Arousal: Alert Behavior During Therapy: WFL for tasks assessed/performed   PT - Cognitive impairments: No apparent impairments                                 Cueing       General Comments General comments (skin integrity, edema, etc.): Pt on RA.  Difficulty getting O2 reading with walking but sats immediately after were 95%.  HR 83 bpm rest and 93 bpm walking.  BPs were stable 124/80 sitting and 133/78 after walking. Educated on PT role and POC with recommendation for frequent mobility at home as toelrated.    Exercises     Assessment/Plan    PT Assessment Patient needs continued PT services  PT Problem List Cardiopulmonary status limiting activity;Decreased activity tolerance;Decreased knowledge of use of DME;Decreased balance;Decreased mobility       PT Treatment Interventions DME instruction;Therapeutic exercise;Gait training;Balance training;Stair training;Neuromuscular re-education;Functional mobility training;Therapeutic activities;Patient/family education    PT Goals (Current goals can be found in the Care Plan section)  Acute Rehab PT Goals Patient Stated Goal: return home (hoping today) PT Goal Formulation: With patient/family Time For Goal Achievement: 07/26/23 Potential to Achieve Goals: Good    Frequency Min 1X/week     Co-evaluation               AM-PAC PT 6 Clicks Mobility  Outcome Measure Help needed turning from your back to your side while in a flat bed without using bedrails?: None Help needed moving from lying on your back to sitting on the side of a flat bed without using bedrails?: None Help needed moving to and from a bed to a chair (including a wheelchair)?: A Little Help needed standing up from a  chair using your arms (e.g., wheelchair or bedside chair)?: A Little Help needed to walk in hospital room?: A Little Help needed climbing 3-5 steps with a railing? : A Little 6 Click Score: 20    End of Session Equipment Utilized During Treatment: Gait belt Activity Tolerance: Patient tolerated treatment well Patient left: in chair;with call bell/phone within reach;with family/visitor present (mobilizes in room with family; no alarm) Nurse Communication: Mobility status PT Visit Diagnosis: Other abnormalities of gait and mobility (R26.89)    Time: 8790-8769 PT Time Calculation (min) (ACUTE ONLY): 21 min   Charges:   PT Evaluation $PT Eval Low Complexity: 1 Low   PT General Charges $$ ACUTE PT VISIT: 1 Visit         Benjiman, PT Acute Rehab Services [facility] [phone]   Benjiman VEAR Mulberry 07/12/2023, 12:55 PM

## 2023-07-12 NOTE — Progress Notes (Signed)
 PROGRESS NOTE  Donna [name]  DOB: 1927/10/26  PCP: [name], NP FMW:[id number]  DOA: 07/10/2023  LOS: 1 day  Hospital Day: 3  Brief narrative: Donna [name] is a 89 y.o. female with PMH significant for hypertrophic cardiomyopathy, HTN, OSA not on CPAP, SVT status post ablation, moderate aortic stenosis, moderate MR, h/o GI bleeding, hyponatremia 6/25, patient presented to the ED with complaint of shortness of breath that started the night before and progressed overnight.  In the ED, she was afebrile, hemodynamically stable Initial labs with WC count 11.5, 133, potassium elevated 5.7, BUN/creatinine 21/1.31 Worsening perihilar and basilar infiltrates or edema.  EKG showed junctional rhythm with a rate of 56, Initially she was hypotensive and was given midodrine .  Lasix  could not be given on admission Admitted to [facility] Cardiology was consulted  Subjective: Patient was seen and examined this morning.  Pleasant elderly Caucasian female. Sitting up in recliner.  Not in distress.  Daughter at bedside.  Seen by cardiology earlier today. Patient was very eager to go home but she understands the recommendation to stay home overnight to monitor on the effect of beta-blocker.  Assessment and plan: Acute exacerbation of chronic CHF Moderate AS, moderate AR, moderate MR HOCM HTN Presented with shortness of breath, PND, orthopnea that started the previous day  Chest x-ray showed evidence of volume overload. BNP elevated Cardiology consult appreciated. Was given 1 dose of IV Lasix  yesterday. Clinically improving. Echocardiogram repeated yesterday showed severe concentric LVH with mid cavitary obliteration and SAM consistent with hypertrophic cardiomyopathy, no regional wall motion abnormality, grade 2 diastolic dysfunction, severely elevated pulmonary artery systolic pressure, RVSP elevated to 69, biatrial dilatation, moderate to severe MR, severe TR, moderate AR, moderate to severe  AS. Today, metoprolol  dose has been increased from 50 mg to 75 mg.  Hydralazine  has been stopped indefinitely. Continue to monitor for daily intake output, weight, blood pressure, BNP, renal function and electrolytes. Net IO Since Admission: 706.76 mL [07/12/23 1400] Recent Labs  Lab 07/10/23 0905 07/10/23 0908 07/10/23 1116 07/11/23 0412 07/12/23 0357  BNP 1,687.9*  --   --   --  1,609.8*  BUN  --  21  --  34* 41*  CREATININE  --  1.31*  --  1.81* 1.45*  NA  --  133*  --  133* 132*  K  --  5.7*  --  5.6* 4.8  MG  --   --  2.1  --   --    SVT On metoprolol   Hyponatremia Sodium level running between 02/14/1933.  Stable.  Hyperkalemia Potassium was elevated 5.6 yesterday.  Improved to 4.8 today with Lasix .  AKI on CKD 3B Creatinine was elevated 1.81.  Gradually improving with diuresis Recent Labs    12/28/22 1130 05/14/23 1552 07/05/23 0938 07/10/23 0908 07/11/23 0412 07/12/23 0357  BUN 16 22 14 21  34* 41*  CREATININE 0.80 0.72 0.76 1.31* 1.81* 1.45*   Hypothyroidism Continue levothyroxine  100 mcg p.o. daily.   Mobility: Encourage ambulation.  No PT follow-up recommended  Goals of care   Code Status: Limited: Do not attempt resuscitation (DNR) -DNR-LIMITED -Do Not Intubate/DNI      DVT prophylaxis:  enoxaparin  (LOVENOX ) injection 30 mg Start: 07/10/23 2200   Antimicrobials: None Fluid: None Consultants: Cardiology Family Communication: Daughter at bedside  Status: Inpatient Level of care:  Progressive   Patient is from: Home Needs to continue in-hospital care: To monitor the effect of increased dose of beta-blocker Anticipated d/c to: Hopefully  home tomorrow.      Diet:  Diet Order             Diet Heart Room service appropriate? Yes; Fluid consistency: Thin; Fluid restriction: 1200 mL Fluid  Diet effective now                   Scheduled Meds:  enoxaparin  (LOVENOX ) injection  30 mg Subcutaneous Q24H   furosemide   20 mg Oral Daily    levothyroxine   100 mcg Oral Q1200   [START ON 07/13/2023] metoprolol  succinate  75 mg Oral Daily    PRN meds: acetaminophen  **OR** acetaminophen , ondansetron  **OR** ondansetron  (ZOFRAN ) IV   Infusions:    Antimicrobials: Anti-infectives (From admission, onward)    None       Objective: Vitals:   07/12/23 0900 07/12/23 1228  BP: (!) 120/53 131/62  Pulse: 88 86  Resp: 19 20  Temp: 98.6 F (37 C) 98 F (36.7 C)  SpO2: 92% 93%    Intake/Output Summary (Last 24 hours) at 07/12/2023 1400 Last data filed at 07/11/2023 2000 Gross per 24 hour  Intake 240 ml  Output 100 ml  Net 140 ml   Filed Weights   07/11/23 0500 07/12/23 0500  Weight: 64.4 kg 64.2 kg   Weight change: -0.2 kg Body mass index is 25.89 kg/m.   Physical Exam: General exam: Pleasant, elderly Caucasian female Skin: No rashes, lesions or ulcers. HEENT: Atraumatic, normocephalic, no obvious bleeding Lungs: Minimal bibasilar crackles CVS: S1, S2, pansystolic murmur GI/Abd: Soft, nontender, nondistended, bowel sound present,   CNS: Alert, awake, oriented x 3 Psychiatry: Mood appropriate,  Extremities: Trace bilateral pedal edema, no calf tenderness,   Data Review: I have personally reviewed the laboratory data and studies available.  F/u labs  Unresulted Labs (From admission, onward)    None       Signed, Chapman Rota, MD [facility] 07/12/2023

## 2023-07-13 DIAGNOSIS — I5033 Acute on chronic diastolic (congestive) heart failure: Secondary | ICD-10-CM | POA: Diagnosis not present

## 2023-07-13 LAB — CBC WITH DIFFERENTIAL/PLATELET
Abs Immature Granulocytes: 0.27 10*3/uL — ABNORMAL HIGH (ref 0.00–0.07)
Basophils Absolute: 0 10*3/uL (ref 0.0–0.1)
Basophils Relative: 0 %
Eosinophils Absolute: 0 10*3/uL (ref 0.0–0.5)
Eosinophils Relative: 1 %
HCT: 35.7 % — ABNORMAL LOW (ref 36.0–46.0)
Hemoglobin: 11 g/dL — ABNORMAL LOW (ref 12.0–15.0)
Immature Granulocytes: 4 %
Lymphocytes Relative: 23 %
Lymphs Abs: 1.7 10*3/uL (ref 0.7–4.0)
MCH: 29.5 pg (ref 26.0–34.0)
MCHC: 30.8 g/dL (ref 30.0–36.0)
MCV: 95.7 fL (ref 80.0–100.0)
Monocytes Absolute: 1.7 10*3/uL — ABNORMAL HIGH (ref 0.1–1.0)
Monocytes Relative: 23 %
Neutro Abs: 3.6 10*3/uL (ref 1.7–7.7)
Neutrophils Relative %: 49 %
Platelets: 91 10*3/uL — ABNORMAL LOW (ref 150–400)
RBC: 3.73 MIL/uL — ABNORMAL LOW (ref 3.87–5.11)
RDW: 13.2 % (ref 11.5–15.5)
WBC: 7.3 10*3/uL (ref 4.0–10.5)
nRBC: 0 % (ref 0.0–0.2)

## 2023-07-13 LAB — BASIC METABOLIC PANEL WITH GFR
Anion gap: 7 (ref 5–15)
BUN: 52 mg/dL — ABNORMAL HIGH (ref 8–23)
CO2: 22 mmol/L (ref 22–32)
Calcium: 9.2 mg/dL (ref 8.9–10.3)
Chloride: 105 mmol/L (ref 98–111)
Creatinine, Ser: 1.27 mg/dL — ABNORMAL HIGH (ref 0.44–1.00)
GFR, Estimated: 39 mL/min — ABNORMAL LOW (ref >60)
Glucose, Bld: 100 mg/dL — ABNORMAL HIGH (ref 70–99)
Potassium: 4.6 mmol/L (ref 3.5–5.1)
Sodium: 134 mmol/L — ABNORMAL LOW (ref 135–145)

## 2023-07-13 MED ORDER — METOPROLOL SUCCINATE ER 100 MG PO TB24: 100.0000 mg | ORAL_TABLET | Freq: Every day | ORAL | 0 refills | Status: DC

## 2023-07-13 MED ORDER — FUROSEMIDE 20 MG PO TABS
20.0000 mg | ORAL_TABLET | Freq: Every day | ORAL | 0 refills | Status: DC | PRN
Start: 2023-07-13 — End: 2023-07-31

## 2023-07-13 MED ORDER — METOPROLOL SUCCINATE ER 100 MG PO TB24
100.0000 mg | ORAL_TABLET | Freq: Every day | ORAL | Status: DC
  Administered 2023-07-13: 100 mg via ORAL
  Filled 2023-07-13: qty 1

## 2023-07-13 NOTE — Discharge Summary (Signed)
 Physician Discharge Summary  Emree Locicero FMW:[id number] DOB: 1927/11/28 DOA: 07/10/2023  PCP: [name], NP  Admit date: 07/10/2023 Discharge date: 07/13/2023  Admitted From: Home Discharge disposition: Home  Recommendations at discharge:  Per cardiology recommendation, will discharge on metoprolol  succinate 100 mg daily.  Hydralazine  and losartan  have been stopped.  Continue Lasix  20 daily as needed for fluid gain. Follow-up with cardiology as an outpatient  Brief narrative: Donna [name] is a 89 y.o. female with PMH significant for hypertrophic cardiomyopathy, HTN, OSA not on CPAP, SVT status post ablation, moderate aortic stenosis, moderate MR, h/o GI bleeding, hyponatremia 6/25, patient presented to the ED with complaint of shortness of breath that started the night before and progressed overnight.  In the ED, she was afebrile, hemodynamically stable Initial labs with WBC count 11.5, 133, potassium elevated 5.7, BUN/creatinine 21/1.31 Worsening perihilar and basilar infiltrates or edema.  EKG showed junctional rhythm with a rate of 56, Initially she was hypotensive and was given midodrine .  Lasix  could not be given on admission Admitted to [facility] Cardiology was consulted  Subjective: Patient was seen and examined this morning.  Sitting up in recliner.  Not in distress  Hospital course: Acute exacerbation of chronic CHF Moderate AS, moderate AR, moderate MR HOCM HTN Presented with shortness of breath, PND, orthopnea that started the previous day  Chest x-ray showed evidence of volume overload. BNP elevated Cardiology consult appreciated. Was diuresed with Lasix . Clinically improving. Echocardiogram repeated 6/26 showed severe concentric LVH with mid cavitary obliteration and SAM consistent with hypertrophic cardiomyopathy, no regional wall motion abnormality, grade 2 diastolic dysfunction, severely elevated pulmonary artery systolic pressure, RVSP elevated to 69,  biatrial dilatation, moderate to severe MR, severe TR, moderate AR, moderate to severe AS. Metoprolol  dose has been increased from 50 mg to 75 mg.  Hydralazine  has been stopped indefinitely. Able to tolerate increased dose of metoprolol .  Per cardiology recommendation, will discharge on metoprolol  succinate 100 mg daily.  Hydralazine  and losartan  have been stopped.  Continue Lasix  20 daily as needed for fluid gain.  SVT On metoprolol   Hyponatremia Sodium level running between 130 and 135. Recent Labs  Lab 07/10/23 0908 07/11/23 0412 07/12/23 0357 07/13/23 0512  NA 133* 133* 132* 134*   Hyperkalemia Potassium was elevated 5.6 yesterday.  Improved to 4.8 today with Lasix . Recent Labs  Lab 07/10/23 0908 07/10/23 1116 07/11/23 0412 07/12/23 0357 07/13/23 0512  K 5.7*  --  5.6* 4.8 4.6  MG  --  2.1  --   --   --   PHOS  --   --  3.4  --   --    AKI on CKD 3B Creatinine was elevated 1.81.  Gradually improving with diuresis Recent Labs    12/28/22 1130 05/14/23 1552 07/05/23 0938 07/10/23 0908 07/11/23 0412 07/12/23 0357 07/13/23 0512  BUN 16 22 14 21  34* 41* 52*  CREATININE 0.80 0.72 0.76 1.31* 1.81* 1.45* 1.27*   Hypothyroidism Continue levothyroxine  100 mcg p.o. daily.   Mobility: Encourage ambulation.  No PT follow-up recommended  Goals of care   Code Status: Limited: Do not attempt resuscitation (DNR) -DNR-LIMITED -Do Not Intubate/DNI    Diet:  Diet Order             Diet - low sodium heart healthy           Diet Heart Room service appropriate? Yes; Fluid consistency: Thin; Fluid restriction: 1200 mL Fluid  Diet effective now  Nutritional status:  Body mass index is 26.09 kg/m.       Wounds:  -    Discharge Exam:   Vitals:   07/12/23 2134 07/13/23 0500 07/13/23 0606 07/13/23 0900  BP: 139/66  123/63 133/62  Pulse: 80  73 69  Resp: 20  18 18   Temp: 98 F (36.7 C)  98 F (36.7 C) 98 F (36.7 C)  TempSrc: Oral   Oral Oral  SpO2: 98%  95% 98%  Weight:  64.7 kg    Height:        Body mass index is 26.09 kg/m.  General exam: Pleasant, elderly Caucasian female Skin: No rashes, lesions or ulcers. HEENT: Atraumatic, normocephalic, no obvious bleeding Lungs: Minimal bibasilar crackles CVS: S1, S2, pansystolic murmur GI/Abd: Soft, nontender, nondistended, bowel sound present,   CNS: Alert, awake, oriented x 3 Psychiatry: Mood appropriate Extremities: Trace bilateral pedal edema, no calf tenderness,  Follow ups:    Follow-up Information     [name], NP Follow up.   Specialty: Geriatric Medicine Contact information: [address] [place] [address] [phone]                 Discharge Instructions:   Discharge Instructions     Call MD for:  difficulty breathing, headache or visual disturbances   Complete by: As directed    Call MD for:  extreme fatigue   Complete by: As directed    Call MD for:  hives   Complete by: As directed    Call MD for:  persistant dizziness or light-headedness   Complete by: As directed    Call MD for:  persistant nausea and vomiting   Complete by: As directed    Call MD for:  severe uncontrolled pain   Complete by: As directed    Call MD for:  temperature >100.4   Complete by: As directed    Diet - low sodium heart healthy   Complete by: As directed    Discharge instructions   Complete by: As directed    Recommendations at discharge:   Per cardiology recommendation, will discharge on metoprolol  succinate 100 mg daily.  Hydralazine  and losartan  have been stopped.  Continue Lasix  20 daily as needed for fluid gain.  Follow-up with cardiology as an outpatient  Discharge instructions for CHF Check weight daily -preferably same time every day. Restrict fluid intake to 1200 ml daily Restrict salt intake to less than 2 g daily. Call MD if you have one of the following symptoms 1) 3 pound weight gain in 24 hours or 5 pounds in  1 week  2) swelling in the hands, feet or stomach  3) progressive shortness of breath 4) if you have to sleep on extra pillows at night in order to breathe       General discharge instructions: Follow with Primary MD [name], NP in 7 days  Please request your PCP  to go over your hospital tests, procedures, radiology results at the follow up. Please get your medicines reviewed and adjusted.  Your PCP may decide to repeat certain labs or tests as needed. Do not drive, operate heavy machinery, perform activities at heights, swimming or participation in water activities or provide baby sitting services if your were admitted for syncope or siezures until you have seen by Primary MD or a Neurologist and advised to do so again. [place]  Controlled Substance Reporting System database was reviewed. Do not drive, operate heavy machinery, perform  activities at heights, swim, participate in water activities or provide baby-sitting services while on medications for pain, sleep and mood until your outpatient physician has reevaluated you and advised to do so again.  You are strongly recommended to comply with the dose, frequency and duration of prescribed medications. Activity: As tolerated with Full fall precautions use walker/cane & assistance as needed Avoid using any recreational substances like cigarette, tobacco, alcohol, or non-prescribed drug. If you experience worsening of your admission symptoms, develop shortness of breath, life threatening emergency, suicidal or homicidal thoughts you must seek medical attention immediately by calling 911 or calling your MD immediately  if symptoms less severe. You must read complete instructions/literature along with all the possible adverse reactions/side effects for all the medicines you take and that have been prescribed to you. Take any new medicine only after you have completely understood and accepted all the possible adverse reactions/side  effects.  Wear Seat belts while driving. You were cared for by a hospitalist during your hospital stay. If you have any questions about your discharge medications or the care you received while you were in the hospital after you are discharged, you can call the unit and ask to speak with the hospitalist or the covering physician. Once you are discharged, your primary care physician will handle any further medical issues. Please note that NO REFILLS for any discharge medications will be authorized once you are discharged, as it is imperative that you return to your primary care physician (or establish a relationship with a primary care physician if you do not have one).   Increase activity slowly   Complete by: As directed        Discharge Medications:   Allergies as of 07/13/2023   No Known Allergies      Medication List     STOP taking these medications    hydrALAZINE  50 MG tablet Commonly known as: APRESOLINE    losartan  100 MG tablet Commonly known as: COZAAR    meclizine  12.5 MG tablet Commonly known as: ANTIVERT        TAKE these medications    Calcium  Carbonate-Vitamin D  600-400 MG-UNIT tablet Take 1 tablet by mouth. At lunch and at bedtime   cyanocobalamin  1000 MCG tablet Commonly known as: VITAMIN B12 Take 1 tablet (1,000 mcg total) by mouth daily.   diclofenac  Sodium 1 % Gel Commonly known as: Voltaren  Apply 4 g topically 4 (four) times daily as needed. What changed:  how much to take when to take this reasons to take this   furosemide  20 MG tablet Commonly known as: LASIX  Take 1 tablet (20 mg total) by mouth daily as needed for fluid or edema.   levothyroxine  100 MCG tablet Commonly known as: SYNTHROID  TAKE 1 TABLET BY MOUTH DAILY 30 MINUTES BEFORE BREAKFAST ON AN EMPTY STOMACH   metoprolol  succinate 100 MG 24 hr tablet Commonly known as: TOPROL -XL Take 1 tablet (100 mg total) by mouth daily. Take with or immediately following a meal. What changed:   medication strength how much to take   OVER THE COUNTER MEDICATION Take 2 capsules by mouth daily. MitoQ   PreserVision AREDS 2 Caps Take 1 capsule by mouth daily.   Retaine MGD 0.5-0.5 % Emul Generic drug: Light Mineral Oil-Mineral Oil Place 1 drop into both eyes in the morning and at bedtime.         The results of significant diagnostics from this hospitalization (including imaging, microbiology, ancillary and laboratory) are listed below for reference.  Procedures and Diagnostic Studies:   ECHOCARDIOGRAM COMPLETE Result Date: 07/11/2023    ECHOCARDIOGRAM REPORT   Patient Name:   Donna [name] [place] Date of Exam: 07/11/2023 Medical Rec #:  [id number]    Height:       62.0 in Accession #:    [id number]   Weight:       142.0 lb Date of Birth:  09-16-1927   BSA:          1.653 m Patient Age:    89 years     BP:           158/71 mmHg Patient Gender: F            HR:           90 bpm. Exam Location:  Inpatient Procedure: 2D Echo, Cardiac Doppler and Color Doppler (Both Spectral and Color            Flow Doppler were utilized during procedure). Indications:    I50.9* Heart failure (unspecified)  History:        Patient has prior history of Echocardiogram examinations, most                 recent 06/21/2023.  Sonographer:    Eva Lash Referring Phys: [id number] ZANE ADAMS IMPRESSIONS  1. Severe concentric LVH with midcavity oblitation and SAM c/w hypertrophic CM. Left ventricular ejection fraction, by estimation, is >75%. The left ventricle has hyperdynamic function. The left ventricle has no regional wall motion abnormalities. There  is severe concentric left ventricular hypertrophy. Left ventricular diastolic parameters are consistent with Grade II diastolic dysfunction (pseudonormalization).  2. Right ventricular systolic function is normal. The right ventricular size is normal. There is severely elevated pulmonary artery systolic pressure. The estimated right ventricular systolic pressure is 68.6  mmHg.  3. Left atrial size was severely dilated.  4. Right atrial size was moderately dilated.  5. There is SAM present with moderate posterior MR. The mitral valve is degenerative. Moderate to severe mitral valve regurgitation. No evidence of mitral stenosis.  6. Tricuspid valve regurgitation is severe.  7. The aortic valve is tricuspid. There is moderate calcification of the aortic valve. Aortic valve regurgitation is mild to moderate. Moderate to severe aortic valve stenosis. Aortic regurgitation PHT measures 403 msec. Aortic valve area, by VTI measures 0.69 cm. Aortic valve mean gradient measures 22.0 mmHg. Aortic valve Vmax measures 3.35 m/s.  8. The inferior vena cava is dilated in size with <50% respiratory variability, suggesting right atrial pressure of 15 mmHg. FINDINGS  Left Ventricle: Severe concentric LVH with midcavity oblitation and SAM c/w hypertrophic CM. Left ventricular ejection fraction, by estimation, is >75%. The left ventricle has hyperdynamic function. The left ventricle has no regional wall motion abnormalities. The left ventricular internal cavity size was normal in size. There is severe concentric left ventricular hypertrophy. Left ventricular diastolic parameters are consistent with Grade II diastolic dysfunction (pseudonormalization). Right Ventricle: The right ventricular size is normal. No increase in right ventricular wall thickness. Right ventricular systolic function is normal. There is severely elevated pulmonary artery systolic pressure. The tricuspid regurgitant velocity is 3.66 m/s, and with an assumed right atrial pressure of 15 mmHg, the estimated right ventricular systolic pressure is 68.6 mmHg. Left Atrium: Left atrial size was severely dilated. Right Atrium: Right atrial size was moderately dilated. Pericardium: There is no evidence of pericardial effusion. Mitral Valve: There is SAM present with moderate posterior MR. The mitral valve is degenerative in appearance. Mild  mitral annular calcification. Moderate to severe mitral valve regurgitation, with posteriorly-directed jet. No evidence of mitral valve stenosis. MV peak gradient, 9.5 mmHg. The mean mitral valve gradient is 4.0 mmHg. Tricuspid Valve: The tricuspid valve is normal in structure. Tricuspid valve regurgitation is severe. No evidence of tricuspid stenosis. Aortic Valve: The aortic valve is tricuspid. There is moderate calcification of the aortic valve. Aortic valve regurgitation is mild to moderate. Aortic regurgitation PHT measures 403 msec. Moderate to severe aortic stenosis is present. Aortic valve mean  gradient measures 22.0 mmHg. Aortic valve peak gradient measures 44.9 mmHg. Aortic valve area, by VTI measures 0.69 cm. Pulmonic Valve: The pulmonic valve was normal in structure. Pulmonic valve regurgitation is mild. No evidence of pulmonic stenosis. Aorta: The aortic root is normal in size and structure. Venous: The inferior vena cava is dilated in size with less than 50% respiratory variability, suggesting right atrial pressure of 15 mmHg. IAS/Shunts: No atrial level shunt detected by color flow Doppler.  LEFT VENTRICLE PLAX 2D LVIDd:         3.00 cm     Diastology LVIDs:         1.80 cm     LV e' medial:    6.09 cm/s LV PW:         1.70 cm     LV E/e' medial:  25.3 LV IVS:        2.00 cm     LV e' lateral:   12.80 cm/s LVOT diam:     1.40 cm     LV E/e' lateral: 12.0 LV SV:         40 LV SV Index:   24 LVOT Area:     1.54 cm  LV Volumes (MOD) LV vol d, MOD A2C: 70.8 ml LV vol d, MOD A4C: 72.6 ml LV vol s, MOD A2C: 23.3 ml LV vol s, MOD A4C: 23.4 ml LV SV MOD A2C:     47.5 ml LV SV MOD A4C:     72.6 ml LV SV MOD BP:      48.7 ml RIGHT VENTRICLE RV S prime:     11.30 cm/s TAPSE (M-mode): 1.1 cm LEFT ATRIUM             Index LA diam:        4.90 cm 2.97 cm/m LA Vol (A2C):   85.9 ml 51.98 ml/m LA Vol (A4C):   61.2 ml 37.03 ml/m LA Biplane Vol: 72.4 ml 43.81 ml/m  AORTIC VALVE AV Area (Vmax):    0.68 cm AV Area  (Vmean):   0.65 cm AV Area (VTI):     0.69 cm AV Vmax:           335.00 cm/s AV Vmean:          213.000 cm/s AV VTI:            0.581 m AV Peak Grad:      44.9 mmHg AV Mean Grad:      22.0 mmHg LVOT Vmax:         148.00 cm/s LVOT Vmean:        89.300 cm/s LVOT VTI:          0.262 m LVOT/AV VTI ratio: 0.45 AI PHT:            403 msec MITRAL VALVE                TRICUSPID VALVE MV Area (PHT): 4.00 cm  TR Peak grad:   53.6 mmHg MV Area VTI:   1.27 cm     TR Vmax:        366.00 cm/s MV Peak grad:  9.5 mmHg MV Mean grad:  4.0 mmHg     SHUNTS MV Vmax:       1.54 m/s     Systemic VTI:  0.26 m MV Vmean:      89.4 cm/s    Systemic Diam: 1.40 cm MR Peak grad: 143.5 mmHg MR Mean grad: 92.0 mmHg MR Vmax:      599.00 cm/s MR Vmean:     451.0 cm/s MV E velocity: 154.00 cm/s MV A velocity: 60.90 cm/s MV E/A ratio:  2.53 Toribio Fuel MD Electronically signed by Toribio Fuel MD Signature Date/Time: 07/11/2023/5:24:47 PM    Final    DG Chest Port 1 View Result Date: 07/10/2023 CLINICAL DATA:  sob EXAM: PORTABLE CHEST - 1 VIEW COMPARISON:  04/06/2022 FINDINGS: Worsening perihilar and basilar infiltrates or edema, left greater than right. Heart size and mediastinal contours are within normal limits. Aortic Atherosclerosis (ICD10-170.0). No effusion. Visualized bones unremarkable. IMPRESSION: Worsening perihilar and basilar infiltrates or edema. Electronically Signed   By: JONETTA Faes M.D.   On: 07/10/2023 10:05     Labs:   Basic Metabolic Panel: Recent Labs  Lab 07/10/23 0908 07/10/23 1116 07/11/23 0412 07/12/23 0357 07/13/23 0512  NA 133*  --  133* 132* 134*  K 5.7*  --  5.6* 4.8 4.6  CL 104  --  105 102 105  CO2 20*  --  20* 22 22  GLUCOSE 159*  --  123* 110* 100*  BUN 21  --  34* 41* 52*  CREATININE 1.31*  --  1.81* 1.45* 1.27*  CALCIUM  9.5  --  9.3 9.3 9.2  MG  --  2.1  --   --   --   PHOS  --   --  3.4  --   --    GFR Estimated Creatinine Clearance: 24.4 mL/min (A) (by C-G formula based on  SCr of 1.27 mg/dL (H)). Liver Function Tests: Recent Labs  Lab 07/10/23 1116 07/11/23 0412  AST 21  --   ALT 18  --   ALKPHOS 42  --   BILITOT 1.0  --   PROT 5.5*  --   ALBUMIN  3.5 3.7   No results for input(s): LIPASE, AMYLASE in the last 168 hours. No results for input(s): AMMONIA in the last 168 hours. Coagulation profile No results for input(s): INR, PROTIME in the last 168 hours.  CBC: Recent Labs  Lab 07/10/23 0908 07/12/23 0357 07/13/23 0512  WBC 11.5* 10.3 7.3  NEUTROABS 8.3* 5.9 3.6  HGB 13.2 11.0* 11.0*  HCT 40.3 34.1* 35.7*  MCV 92.0 91.2 95.7  PLT 156 95* 91*   Cardiac Enzymes: No results for input(s): CKTOTAL, CKMB, CKMBINDEX, TROPONINI in the last 168 hours. BNP: Invalid input(s): POCBNP CBG: No results for input(s): GLUCAP in the last 168 hours. D-Dimer No results for input(s): DDIMER in the last 72 hours. Hgb A1c No results for input(s): HGBA1C in the last 72 hours. Lipid Profile No results for input(s): CHOL, HDL, LDLCALC, TRIG, CHOLHDL, LDLDIRECT in the last 72 hours. Thyroid  function studies No results for input(s): TSH, T4TOTAL, T3FREE, THYROIDAB in the last 72 hours.  Invalid input(s): FREET3 Anemia work up No results for input(s): VITAMINB12, FOLATE, FERRITIN, TIBC, IRON, RETICCTPCT in the last 72 hours. Microbiology No results found for this  or any previous visit (from the past 240 hours).  Time coordinating discharge: 45 minutes  Signed: Ceylin Dreibelbis  [facility] 07/13/2023, 12:12 PM

## 2023-07-13 NOTE — Progress Notes (Signed)
  Progress Note  Patient Name: Donna [name] Date of Encounter: 07/13/2023 [facility] Cardiologist: Madonna Large, DO   Interval Summary   Feels well. No problems with bradycardia or junctional rhythm. BP controlled on beta-blocker only.  Vital Signs Vitals:   07/12/23 1228 07/12/23 2134 07/13/23 0500 07/13/23 0606  BP: 131/62 139/66  123/63  Pulse: 86 80  73  Resp: 20 20  18   Temp: 98 F (36.7 C) 98 F (36.7 C)  98 F (36.7 C)  TempSrc: Oral Oral  Oral  SpO2: 93% 98%  95%  Weight:   64.7 kg   Height:        Intake/Output Summary (Last 24 hours) at 07/13/2023 0747 Last data filed at 07/13/2023 0600 Gross per 24 hour  Intake 1000 ml  Output 800 ml  Net 200 ml      07/13/2023    5:00 AM 07/12/2023    5:00 AM 07/11/2023    5:00 AM  Last 3 Weights  Weight (lbs) 142 lb 10.2 oz 141 lb 8.6 oz 141 lb 15.6 oz  Weight (kg) 64.7 kg 64.2 kg 64.4 kg      Telemetry/ECG  SR, occ V bigeminy - Personally Reviewed  Physical Exam  GEN: No acute distress.   Neck: No JVD Cardiac: RRR, apical holosystolic murmur much louder than RUSB ejection murmur, no diastolic murmurs, rubs, or gallops. Systolic murmurs intensify when standing. Respiratory: A few dry crackles in both basesClear to auscultation bilaterally. GI: Soft, nontender, non-distended  MS: No edema  Assessment & Plan  Increase metoprolol  to 100 mg daily. [facility] will sign off.   The patient is ready for discharge today from a cardiac standpoint. Medication Recommendations:   Metoprolol  succinate 100 mg daily Stop hydralazine  and losartan  Furosemide  20 mg daily as needed for fluid gain Other recommendations (labs, testing, etc):  weigh daily and bring weight log to appt. Follow up as an outpatient:  08/16/2023 Dr. Large For questions or updates, please contact  [facility] Please consult [email or web address] for contact info under       Signed, Jerel Balding, MD

## 2023-07-13 NOTE — Plan of Care (Signed)
  Problem: Clinical Measurements: Goal: Ability to maintain clinical measurements within normal limits will improve Outcome: Progressing   Problem: Safety: Goal: Ability to remain free from injury will improve Outcome: Progressing   

## 2023-07-13 NOTE — Progress Notes (Signed)
 Discharge instructions reviewed with patient and patient family. All questions answered. IV removed. Pt verbalized that they have a walker, cane, and potty chair at home.

## 2023-07-15 ENCOUNTER — Telehealth: Payer: Self-pay | Admitting: *Deleted

## 2023-07-15 NOTE — Transitions of Care (Post Inpatient/ED Visit) (Signed)
   07/15/2023  Name: Graysen Depaula MRN: [id number] DOB: [birth year]  Today's TOC FU Call Status: Today's TOC FU Call Status:: Successful TOC FU Call Completed TOC FU Call Complete Date: 07/15/23  Attempted to reach the patient regarding the most recent Inpatient/ED visit.  Follow Up Plan: Additional outreach attempts will be made to reach the patient to complete the Transitions of Care (Post Inpatient/ED visit) call.   Cathlean Headland BSN RN [place] [facility] Management Coordinator [email or web address] Direct Dial: [phone]  Fax: [phone] Website: [email or web address]

## 2023-07-16 ENCOUNTER — Telehealth: Payer: Self-pay | Admitting: *Deleted

## 2023-07-16 NOTE — Transitions of Care (Post Inpatient/ED Visit) (Signed)
   07/16/2023  Name: Donna [name] MRN: [id number] DOB: 1927/10/28  Today's TOC FU Call Status: Today's TOC FU Call Status:: Unsuccessful Call (2nd Attempt) Unsuccessful Call (2nd Attempt) Date: 07/16/23  Attempted to reach the patient regarding the most recent Inpatient/ED visit.  Follow Up Plan: Additional outreach attempts will be made to reach the patient to complete the Transitions of Care (Post Inpatient/ED visit) call.   Cathlean Headland BSN RN [facility] Health Care Management Coordinator [email or web address] Direct Dial: [phone]  Fax: [phone] Website: [email or web address]

## 2023-07-17 ENCOUNTER — Encounter: Payer: Self-pay | Admitting: Sports Medicine

## 2023-07-17 ENCOUNTER — Ambulatory Visit: Admitting: Sports Medicine

## 2023-07-17 VITALS — BP 118/82 | HR 63 | Temp 97.4°F | Ht 62.0 in | Wt 142.0 lb

## 2023-07-17 DIAGNOSIS — Z09 Encounter for follow-up examination after completed treatment for conditions other than malignant neoplasm: Secondary | ICD-10-CM

## 2023-07-17 DIAGNOSIS — N1831 Chronic kidney disease, stage 3a: Secondary | ICD-10-CM | POA: Diagnosis not present

## 2023-07-17 DIAGNOSIS — I10 Essential (primary) hypertension: Secondary | ICD-10-CM

## 2023-07-17 DIAGNOSIS — I5033 Acute on chronic diastolic (congestive) heart failure: Secondary | ICD-10-CM

## 2023-07-17 NOTE — Progress Notes (Signed)
 Careteam: Patient Care Team: [name], NP as PCP - General (Geriatric Medicine) Michele Richardson, DO as PCP - Cardiology (Cardiology) Tobie Bamberger, OD as Consulting Physician (Optometry) Elihue Dempsey BRAVO., MD as Referring Physician (Orthopedic Surgery) Michele Richardson, DO as Referring Physician (Cardiology)  PLACE OF SERVICE:  [facility]  Advanced Directive information    No Known Allergies  Chief Complaint  Patient presents with   Hospitalization Follow-up    Was dishcarged from hospital on 07/10/23 : have some concerns regardin hospital visit  hydrALAZINE  50 MG tablet In the hospital regarding fluid in her heart , and hasn't felt good since she's been discharged      Discussed the use of AI scribe software for clinical note transcription with the patient, who gave verbal consent to proceed.  History of Present Illness    Donna [name] is a 89 year old female with heart failure who presents with weakness and shortness of breath following recent hospitalization. She is accompanied by her daughter, who is also her primary caregiver.  She was recently hospitalized for heart failure, during which some of her blood pressure medications were discontinued due to low blood pressure and decreased kidney function. Since discharge, she has experienced weakness and shortness of breath, with slight improvement but persistent lack of strength. She has not attempted to lay flat since her hospitalization and sleeps with her head elevated using an adjustable mattress. No swelling in her legs and her weight has remained stable since discharge.  Her appetite is reduced, and she consumes small amounts of food, such as half a bowl of cereal, fruit, cheese, and a slice of toast daily. Her fluid intake is below the 40 ounces per day restriction, averaging 16 to 24 ounces daily. She takes Lasix  as needed, with her daughter assisting in deciding when to administer it based on her fluid intake and  symptoms. She took Lasix  this morning.  No dizziness, lightheadedness, or pain with urination. She has a chronic cough, which she occasionally treats with over-the-counter cough medicine.  Review of Systems:  Review of Systems  Constitutional:  Positive for malaise/fatigue. Negative for chills and fever.  HENT:  Negative for congestion and sore throat.   Eyes:  Negative for double vision.  Respiratory:  Positive for shortness of breath (exertional). Negative for cough and sputum production.   Cardiovascular:  Negative for chest pain, palpitations and leg swelling.  Gastrointestinal:  Negative for abdominal pain, heartburn and nausea.  Genitourinary:  Negative for dysuria and hematuria.  Musculoskeletal:  Negative for myalgias.  Neurological:  Negative for dizziness.   Negative unless indicated in HPI.   Past Medical History:  Diagnosis Date   Aortic stenosis    Hemorrhage of gastrointestinal tract, unspecified    Hypertension    Migraine    Mitral regurgitation    OA (osteoarthritis)    Thyroid  cyst    Past Surgical History:  Procedure Laterality Date   CATARACT EXTRACTION  1990   rt   CATARACT EXTRACTION  2005   left   LEFT HEART CATH AND CORONARY ANGIOGRAPHY N/A 10/06/2019   Procedure: LEFT HEART CATH AND CORONARY ANGIOGRAPHY;  Surgeon: Elmira Newman PARAS, MD;  Location: [facility];  Service: Cardiovascular;  Laterality: N/A;   REVISION TOTAL KNEE ARTHROPLASTY  2008   SVT ABLATION N/A 01/18/2020   Procedure: SVT ABLATION;  Surgeon: Waddell Danelle ORN, MD;  Location: [facility];  Service: Cardiovascular;  Laterality: N/A;   THUMB ARTHROSCOPY  TONSILLECTOMY     Social History:   reports that she quit smoking about 75 years ago. Her smoking use included cigarettes. She started smoking about 77 years ago. She has never used smokeless tobacco. She reports that she does not currently use alcohol. She reports that she does not use drugs.  Family History   Problem Relation Age of Onset   CVA Father 66   Pancreatic cancer Mother    CVA Sister 26   Brain cancer Brother    Arthritis Sister    Heart disease Sister    Breast cancer Neg Hx     Medications: Patient's Medications  New Prescriptions   No medications on file  Previous Medications   CALCIUM  CARBONATE-VITAMIN D  600-400 MG-UNIT PER TABLET    Take 1 tablet by mouth. At lunch and at bedtime   DICLOFENAC  SODIUM (VOLTAREN ) 1 % GEL    Apply 4 g topically 4 (four) times daily as needed.   FUROSEMIDE  (LASIX ) 20 MG TABLET    Take 1 tablet (20 mg total) by mouth daily as needed for fluid or edema.   LEVOTHYROXINE  (SYNTHROID ) 100 MCG TABLET    TAKE 1 TABLET BY MOUTH DAILY 30 MINUTES BEFORE BREAKFAST ON AN EMPTY STOMACH   LIGHT MINERAL OIL-MINERAL OIL (RETAINE MGD) 0.5-0.5 % EMUL    Place 1 drop into both eyes in the morning and at bedtime.   METOPROLOL  SUCCINATE (TOPROL -XL) 100 MG 24 HR TABLET    Take 1 tablet (100 mg total) by mouth daily. Take with or immediately following a meal.   MULTIPLE VITAMINS-MINERALS (PRESERVISION AREDS 2) CAPS    Take 1 capsule by mouth daily.   OVER THE COUNTER MEDICATION    Take 2 capsules by mouth daily. MitoQ   VITAMIN B-12 (CYANOCOBALAMIN ) 1000 MCG TABLET    Take 1 tablet (1,000 mcg total) by mouth daily.  Modified Medications   No medications on file  Discontinued Medications   No medications on file    Physical Exam: There were no vitals filed for this visit. There is no height or weight on file to calculate BMI. BP Readings from Last 3 Encounters:  07/13/23 133/62  07/05/23 134/60  05/14/23 (!) 142/64   Wt Readings from Last 3 Encounters:  07/13/23 142 lb 10.2 oz (64.7 kg)  07/05/23 140 lb (63.5 kg)  05/14/23 141 lb 9.6 oz (64.2 kg)    Physical Exam Constitutional:      Appearance: Normal appearance.  HENT:     Head: Normocephalic and atraumatic.  Cardiovascular:     Rate and Rhythm: Normal rate and regular rhythm.  Pulmonary:      Effort: Pulmonary effort is normal. No respiratory distress.     Breath sounds: Rales (minimal crackles at lung base) present. No wheezing.  Abdominal:     General: Bowel sounds are normal. There is no distension.     Tenderness: There is no abdominal tenderness. There is no guarding or rebound.     Comments:    Musculoskeletal:        General: No swelling.  Neurological:     Mental Status: She is alert. Mental status is at baseline.     Motor: No weakness.     Labs reviewed: Basic Metabolic Panel: Recent Labs    12/28/22 1130 05/14/23 1552 07/05/23 0938 07/10/23 1116 07/11/23 0412 07/12/23 0357 07/13/23 0512  NA 136 134*   < >  --  133* 132* 134*  K 4.8 4.7   < >  --  5.6* 4.8 4.6  CL 104 101   < >  --  105 102 105  CO2 28 28   < >  --  20* 22 22  GLUCOSE 86 85   < >  --  123* 110* 100*  BUN 16 22   < >  --  34* 41* 52*  CREATININE 0.80 0.72   < >  --  1.81* 1.45* 1.27*  CALCIUM  9.9 9.9   < >  --  9.3 9.3 9.2  MG  --   --   --  2.1  --   --   --   PHOS  --   --   --   --  3.4  --   --   TSH 2.66 1.77  --   --   --   --   --    < > = values in this interval not displayed.   Liver Function Tests: Recent Labs    12/28/22 1130 07/05/23 0938 07/10/23 1116 07/11/23 0412  AST 15 12 21   --   ALT 15 14 18   --   ALKPHOS  --   --  42  --   BILITOT 0.4 0.5 1.0  --   PROT 5.7* 5.6* 5.5*  --   ALBUMIN   --   --  3.5 3.7   No results for input(s): LIPASE, AMYLASE in the last 8760 hours. No results for input(s): AMMONIA in the last 8760 hours. CBC: Recent Labs    07/10/23 0908 07/12/23 0357 07/13/23 0512  WBC 11.5* 10.3 7.3  NEUTROABS 8.3* 5.9 3.6  HGB 13.2 11.0* 11.0*  HCT 40.3 34.1* 35.7*  MCV 92.0 91.2 95.7  PLT 156 95* 91*   Lipid Panel: No results for input(s): CHOL, HDL, LDLCALC, TRIG, CHOLHDL, LDLDIRECT in the last 8760 hours. TSH: Recent Labs    12/28/22 1130 05/14/23 1552  TSH 2.66 1.77   A1C: Lab Results  Component Value Date    HGBA1C 5.1 08/22/2016    Assessment and Plan Assessment & Plan  1. Acute on chronic diastolic (congestive) heart failure (HCC) (Primary) Pt does not appear to be in distress Lungs clear  No lower extremity swelling Avoid salty foods Weight stable since discharge - Basic Metabolic Panel - Ambulatory referral to Home Health  2. Essential hypertension, benign At goal  Cont with the same  3. Hospital discharge follow-up  - Basic Metabolic Panel - Ambulatory referral to Home Health  4. Stage 3a chronic kidney disease (HCC) Will check bmp Avoid naids

## 2023-07-18 ENCOUNTER — Ambulatory Visit: Payer: Self-pay | Admitting: Sports Medicine

## 2023-07-18 LAB — BASIC METABOLIC PANEL WITH GFR
BUN/Creatinine Ratio: 36 (calc) — ABNORMAL HIGH (ref 6–22)
BUN: 39 mg/dL — ABNORMAL HIGH (ref 7–25)
CO2: 28 mmol/L (ref 20–32)
Calcium: 10.4 mg/dL (ref 8.6–10.4)
Chloride: 105 mmol/L (ref 98–110)
Creat: 1.09 mg/dL — ABNORMAL HIGH (ref 0.60–0.95)
Glucose, Bld: 89 mg/dL (ref 65–99)
Potassium: 5.2 mmol/L (ref 3.5–5.3)
Sodium: 137 mmol/L (ref 135–146)
eGFR: 47 mL/min/{1.73_m2} — ABNORMAL LOW (ref >=60)

## 2023-07-22 ENCOUNTER — Telehealth: Payer: Self-pay | Admitting: *Deleted

## 2023-07-22 NOTE — Telephone Encounter (Signed)
 Spoke with Daughter and appointment scheduled with Monina for Thursday.

## 2023-07-22 NOTE — Telephone Encounter (Signed)
 Copied from CRM [id number]. Topic: General - Other >> Jul 22, 2023  2:53 PM Shamecia H wrote: Reason for CRM: Ida from [facility] has tried to contact the patient and her daughter to go and do home visits per referral and she is unable to reach the patient or her daughter and wants to inform the provider of this. Ida can be reached at [phone].    FYI

## 2023-07-22 NOTE — Telephone Encounter (Signed)
 Called and spoke with Daughter, Amy. She stated that patient is declining. Lost 2 pounds since hospital. Not eating or drinking very much. Consistent SOB.   Daughter is requesting a referral for Hospice if you feel it is appropriate.

## 2023-07-22 NOTE — Telephone Encounter (Signed)
 We need to schedule her for hospital follow up to evaluate.

## 2023-07-22 NOTE — Telephone Encounter (Signed)
 Copied from CRM [id number]. Topic: General - Other >> Jul 22, 2023 11:14 AM Alfonso ORN wrote: Reason for CRM:  Daughter Amy Speaks request to speak with Harlene An possible needing to set up hospice care due to patient declining  Call back number [phone]

## 2023-07-24 ENCOUNTER — Telehealth: Payer: Self-pay | Admitting: Cardiology

## 2023-07-24 NOTE — Telephone Encounter (Signed)
 Pts daughter would like a c/b after the pts hospital visit.

## 2023-07-24 NOTE — Telephone Encounter (Signed)
 Been a pt of Dr Tyree for several years. Pt was at [facility] and was put on Lasix  PRN. Went for f/u with PCP 07/17/23- They said everything was fine. Pt has been taking the Lasix  daily due to the increased shortness of breath.   Daughter reports that the patient is more short of breath than before. She is also short of breath without doing anything. She is very fatigued. She has significantly declined- She is struggling to do anything. They have an appt with the PCP again tomorrow. Daughter wondering if she even needs to go to the PCP, if they can even do anything for her.   She has actually lost 3 pounds over the last week. She is not gaining weight or swollen in lower extremities. BP's and hr have been stable. The patient is hardly eating either, because she feels so full? She has no appetite. She is just wondering what she should do?   I asked her if her mother is as bad or worse than what she was when she was admitted to the hospital 6/25. She reports that this morning she was as bad, if not worse, but it will ebb and flow.  I recommended that she take her mother back to the ER to be evaluated. She verbalized understanding.

## 2023-07-24 NOTE — Telephone Encounter (Signed)
 I think her symptoms are multifactorial and better served at [facility].   Fancy Dunkley Jolivue, DO, FACC

## 2023-07-25 ENCOUNTER — Encounter: Payer: Self-pay | Admitting: Adult Health

## 2023-07-25 ENCOUNTER — Ambulatory Visit: Admitting: Adult Health

## 2023-07-25 VITALS — BP 106/50 | HR 63 | Temp 97.2°F | Ht 62.0 in | Wt 142.0 lb

## 2023-07-25 DIAGNOSIS — I5033 Acute on chronic diastolic (congestive) heart failure: Secondary | ICD-10-CM

## 2023-07-25 DIAGNOSIS — E875 Hyperkalemia: Secondary | ICD-10-CM

## 2023-07-25 DIAGNOSIS — N1831 Chronic kidney disease, stage 3a: Secondary | ICD-10-CM

## 2023-07-25 DIAGNOSIS — E039 Hypothyroidism, unspecified: Secondary | ICD-10-CM

## 2023-07-25 DIAGNOSIS — I471 Supraventricular tachycardia, unspecified: Secondary | ICD-10-CM | POA: Diagnosis not present

## 2023-07-25 MED ORDER — FUROSEMIDE 20 MG PO TABS: 20.0000 mg | ORAL_TABLET | Freq: Every day | ORAL | Status: DC

## 2023-07-25 NOTE — Progress Notes (Signed)
 [facility] clinic  Provider:  Jereld Serum DNP  Code Status:  DNR  Goals of Care:     07/10/2023    2:55 PM  Advanced Directives  Would patient like information on creating a medical advance directive? No - Patient declined     Chief Complaint  Patient presents with   Referral    Referral to hospice //     Discussed the use of AI scribe software for clinical note transcription with the patient, who gave verbal consent to proceed.  HPI: Patient is a 89 y.o. female seen today for an acute visit for referral to hospice./SOB. She was accompanied by her daughter.  She has experienced worsening shortness of breath since her last visit a week ago. The dyspnea occurs with minimal exertion, such as talking, and has been severe enough to cause difficulty sleeping, requiring her to sleep in a recliner. She was hospitalized from June 25 to June 28 for acute exacerbation of congestive heart failure, where she was treated with Lasix  and showed improvement.  During her hospitalization, echocardiogram showed severe concentric left ventricular hypertrophy with midcavitary obliteration and systolic anterior motion. Echocardiogram also showed moderate aortic regurgitation and moderate to severe aortic stenosis. Her medications include metoprolol , which was increased to 100 mg daily, and Lasix  20 mg as needed, which she has been taking daily due to persistent shortness of breath.  She has a history of chronic kidney disease stage 3B, with a creatinine level of 1.09. Her sodium levels have been stable, and her potassium was elevated at 5.6 during her last hospital stay but improved to 5.2. She is on a fluid restriction of 1.2 liters per day and has been experiencing a decreased appetite, consuming minimal food and fluid intake.  Her daughter reports that her mental acuity has improved since the hospitalization, although she was initially in a fog. The daughter has been administering Lasix  daily and  monitoring her blood pressure, which has been stable. She uses a walker for mobility and lives with her daughter, who is her primary caregiver. Her daughter and sister, along with her children, assist in her care. She has a history of hypothyroidism, for which she takes levothyroxine  200 mcg daily.  No cough or phlegm production. No significant weight gain, with a slight weight loss of about two pounds since her last hospital discharge.        Past Medical History:  Diagnosis Date   Aortic stenosis    Hemorrhage of gastrointestinal tract, unspecified    Hypertension    Migraine    Mitral regurgitation    OA (osteoarthritis)    Thyroid  cyst     Past Surgical History:  Procedure Laterality Date   CATARACT EXTRACTION  1990   rt   CATARACT EXTRACTION  2005   left   LEFT HEART CATH AND CORONARY ANGIOGRAPHY N/A 10/06/2019   Procedure: LEFT HEART CATH AND CORONARY ANGIOGRAPHY;  Surgeon: Elmira Newman PARAS, MD;  Location: [facility] INVASIVE CV LAB;  Service: Cardiovascular;  Laterality: N/A;   REVISION TOTAL KNEE ARTHROPLASTY  2008   SVT ABLATION N/A 01/18/2020   Procedure: SVT ABLATION;  Surgeon: Waddell Danelle ORN, MD;  Location: [facility] INVAS[facility] CV [facility];  Service: Cardiovascular;  Laterality: N/A;   THUMB ARTHROSCOPY     TONSILLECTOMY      No Known Allergies  Outpatient Encounter Medications as of 07/25/2023  Medication Sig   Calcium  Carbonate-Vitamin D  600-400 MG-UNIT per tablet Take 1 tablet by mouth. At lunch and at  bedtime   diclofenac  Sodium (VOLTAREN ) 1 % GEL Apply 4 g topically 4 (four) times daily as needed.   furosemide  (LASIX ) 20 MG tablet Take 1 tablet (20 mg total) by mouth daily as needed for fluid or edema.   furosemide  (LASIX ) 20 MG tablet Take 1 tablet (20 mg total) by mouth daily. Hold for SBP <110   levothyroxine  (SYNTHROID ) 100 MCG tablet TAKE 1 TABLET BY MOUTH DAILY 30 MINUTES BEFORE BREAKFAST ON AN EMPTY STOMACH   Light Mineral Oil-Mineral Oil (RETAINE MGD) 0.5-0.5 % EMUL Place  1 drop into both eyes in the morning and at bedtime.   metoprolol  succinate (TOPROL -XL) 100 MG 24 hr tablet Take 1 tablet (100 mg total) by mouth daily. Take with or immediately following a meal.   Multiple Vitamins-Minerals (PRESERVISION AREDS 2) CAPS Take 1 capsule by mouth daily.   OVER THE COUNTER MEDICATION Take 2 capsules by mouth daily. MitoQ   vitamin B-12 (CYANOCOBALAMIN ) 1000 MCG tablet Take 1 tablet (1,000 mcg total) by mouth daily.   No facility-administered encounter medications on file as of 07/25/2023.    Review of Systems:  Review of Systems  Constitutional:  Negative for appetite change, chills, fatigue and fever.  HENT:  Negative for congestion, hearing loss, rhinorrhea and sore throat.   Eyes: Negative.   Respiratory:  Negative for cough, shortness of breath and wheezing.   Cardiovascular:  Negative for chest pain, palpitations and leg swelling.  Gastrointestinal:  Negative for abdominal pain, constipation, diarrhea, nausea and vomiting.  Genitourinary:  Negative for dysuria.  Musculoskeletal:  Negative for arthralgias, back pain and myalgias.  Skin:  Negative for color change, rash and wound.  Neurological:  Negative for dizziness, weakness and headaches.  Psychiatric/Behavioral:  Negative for behavioral problems. The patient is not nervous/anxious.     Health Maintenance  Topic Date Due   COVID-19 Vaccine (7 - 2024-25 season) 05/10/2023   DEXA SCAN  01/16/2028 (Originally 01/04/1995)   INFLUENZA VACCINE  08/16/2023   DTaP/Tdap/Td (2 - Td or Tdap) 08/28/2023   Medicare Annual Wellness (AWV)  11/09/2023   Pneumococcal Vaccine: 50+ Years  Completed   Zoster Vaccines- Shingrix  Completed   Hepatitis B Vaccines  Aged Out   HPV VACCINES  Aged Out   Meningococcal B Vaccine  Aged Out    Physical Exam: Vitals:   07/25/23 1510  BP: (!) 106/50  Pulse: 63  Temp: (!) 97.2 F (36.2 C)  SpO2: 98%  Weight: 142 lb (64.4 kg)  Height: 5' 2 (1.575 m)   Body mass  index is 25.97 kg/m. Physical Exam Constitutional:      Appearance: Normal appearance.  HENT:     Head: Normocephalic and atraumatic.     Nose: Nose normal.     Mouth/Throat:     Mouth: Mucous membranes are moist.  Eyes:     Conjunctiva/sclera: Conjunctivae normal.  Cardiovascular:     Rate and Rhythm: Normal rate and regular rhythm.     Heart sounds: Murmur heard.  Pulmonary:     Effort: Pulmonary effort is normal.     Breath sounds: Normal breath sounds.  Abdominal:     General: Bowel sounds are normal.     Palpations: Abdomen is soft.  Musculoskeletal:        General: Normal range of motion.     Cervical back: Normal range of motion.     Right lower leg: Edema present.     Left lower leg: Edema present.  Comments: BLE1+ edema  Skin:    General: Skin is warm and dry.  Neurological:     General: No focal deficit present.     Mental Status: She is alert and oriented to person, place, and time.  Psychiatric:        Mood and Affect: Mood normal.        Behavior: Behavior normal.        Thought Content: Thought content normal.        Judgment: Judgment normal.     Labs reviewed: Basic Metabolic Panel: Recent Labs    12/28/22 1130 05/14/23 1552 07/05/23 0938 07/10/23 1116 07/11/23 0412 07/12/23 0357 07/13/23 0512 07/17/23 1144  NA 136 134*   < >  --  133* 132* 134* 137  K 4.8 4.7   < >  --  5.6* 4.8 4.6 5.2  CL 104 101   < >  --  105 102 105 105  CO2 28 28   < >  --  20* 22 22 28   GLUCOSE 86 85   < >  --  123* 110* 100* 89  BUN 16 22   < >  --  34* 41* 52* 39*  CREATININE 0.80 0.72   < >  --  1.81* 1.45* 1.27* 1.09*  CALCIUM  9.9 9.9   < >  --  9.3 9.3 9.2 10.4  MG  --   --   --  2.1  --   --   --   --   PHOS  --   --   --   --  3.4  --   --   --   TSH 2.66 1.77  --   --   --   --   --   --    < > = values in this interval not displayed.   Liver Function Tests: Recent Labs    12/28/22 1130 07/05/23 0938 07/10/23 1116 07/11/23 0412  AST 15 12 21    --   ALT 15 14 18   --   ALKPHOS  --   --  42  --   BILITOT 0.4 0.5 1.0  --   PROT 5.7* 5.6* 5.5*  --   ALBUMIN   --   --  3.5 3.7   No results for input(s): LIPASE, AMYLASE in the last 8760 hours. No results for input(s): AMMONIA in the last 8760 hours. CBC: Recent Labs    07/10/23 0908 07/12/23 0357 07/13/23 0512  WBC 11.5* 10.3 7.3  NEUTROABS 8.3* 5.9 3.6  HGB 13.2 11.0* 11.0*  HCT 40.3 34.1* 35.7*  MCV 92.0 91.2 95.7  PLT 156 95* 91*   Lipid Panel: No results for input(s): CHOL, HDL, LDLCALC, TRIG, CHOLHDL, LDLDIRECT in the last 8760 hours. Lab Results  Component Value Date   HGBA1C 5.1 08/22/2016    Procedures since last visit: ECHOCARDIOGRAM COMPLETE Result Date: 07/11/2023    ECHOCARDIOGRAM REPORT   Patient Name:   Donna [name] League Date of Exam: 07/11/2023 Medical Rec #:  [id number]    Height:       62.0 in Accession #:    [id number]   Weight:       142.0 lb Date of Birth:  [birth year]   BSA:          1.653 m Patient Age:    89 years     BP:           158/71 mmHg Patient Gender: F  HR:           90 bpm. Exam Location:  Inpatient Procedure: 2D Echo, Cardiac Doppler and Color Doppler (Both Spectral and Color            Flow Doppler were utilized during procedure). Indications:    I50.9* Heart failure (unspecified)  History:        Patient has prior history of Echocardiogram examinations, most                 recent 06/21/2023.  Sonographer:    Eva Lash Referring Phys: [id number] ZANE ADAMS IMPRESSIONS  1. Severe concentric LVH with midcavity oblitation and SAM c/w hypertrophic CM. Left ventricular ejection fraction, by estimation, is >75%. The left ventricle has hyperdynamic function. The left ventricle has no regional wall motion abnormalities. There  is severe concentric left ventricular hypertrophy. Left ventricular diastolic parameters are consistent with Grade II diastolic dysfunction (pseudonormalization).  2. Right ventricular systolic function is normal.  The right ventricular size is normal. There is severely elevated pulmonary artery systolic pressure. The estimated right ventricular systolic pressure is 68.6 mmHg.  3. Left atrial size was severely dilated.  4. Right atrial size was moderately dilated.  5. There is SAM present with moderate posterior MR. The mitral valve is degenerative. Moderate to severe mitral valve regurgitation. No evidence of mitral stenosis.  6. Tricuspid valve regurgitation is severe.  7. The aortic valve is tricuspid. There is moderate calcification of the aortic valve. Aortic valve regurgitation is mild to moderate. Moderate to severe aortic valve stenosis. Aortic regurgitation PHT measures 403 msec. Aortic valve area, by VTI measures 0.69 cm. Aortic valve mean gradient measures 22.0 mmHg. Aortic valve Vmax measures 3.35 m/s.  8. The inferior vena cava is dilated in size with <50% respiratory variability, suggesting right atrial pressure of 15 mmHg. FINDINGS  Left Ventricle: Severe concentric LVH with midcavity oblitation and SAM c/w hypertrophic CM. Left ventricular ejection fraction, by estimation, is >75%. The left ventricle has hyperdynamic function. The left ventricle has no regional wall motion abnormalities. The left ventricular internal cavity size was normal in size. There is severe concentric left ventricular hypertrophy. Left ventricular diastolic parameters are consistent with Grade II diastolic dysfunction (pseudonormalization). Right Ventricle: The right ventricular size is normal. No increase in right ventricular wall thickness. Right ventricular systolic function is normal. There is severely elevated pulmonary artery systolic pressure. The tricuspid regurgitant velocity is 3.66 m/s, and with an assumed right atrial pressure of 15 mmHg, the estimated right ventricular systolic pressure is 68.6 mmHg. Left Atrium: Left atrial size was severely dilated. Right Atrium: Right atrial size was moderately dilated. Pericardium:  There is no evidence of pericardial effusion. Mitral Valve: There is SAM present with moderate posterior MR. The mitral valve is degenerative in appearance. Mild mitral annular calcification. Moderate to severe mitral valve regurgitation, with posteriorly-directed jet. No evidence of mitral valve stenosis. MV peak gradient, 9.5 mmHg. The mean mitral valve gradient is 4.0 mmHg. Tricuspid Valve: The tricuspid valve is normal in structure. Tricuspid valve regurgitation is severe. No evidence of tricuspid stenosis. Aortic Valve: The aortic valve is tricuspid. There is moderate calcification of the aortic valve. Aortic valve regurgitation is mild to moderate. Aortic regurgitation PHT measures 403 msec. Moderate to severe aortic stenosis is present. Aortic valve mean  gradient measures 22.0 mmHg. Aortic valve peak gradient measures 44.9 mmHg. Aortic valve area, by VTI measures 0.69 cm. Pulmonic Valve: The pulmonic valve was normal in structure. Pulmonic valve regurgitation  is mild. No evidence of pulmonic stenosis. Aorta: The aortic root is normal in size and structure. Venous: The inferior vena cava is dilated in size with less than 50% respiratory variability, suggesting right atrial pressure of 15 mmHg. IAS/Shunts: No atrial level shunt detected by color flow Doppler.  LEFT VENTRICLE PLAX 2D LVIDd:         3.00 cm     Diastology LVIDs:         1.80 cm     LV e' medial:    6.09 cm/s LV PW:         1.70 cm     LV E/e' medial:  25.3 LV IVS:        2.00 cm     LV e' lateral:   12.80 cm/s LVOT diam:     1.40 cm     LV E/e' lateral: 12.0 LV SV:         40 LV SV Index:   24 LVOT Area:     1.54 cm  LV Volumes (MOD) LV vol d, MOD A2C: 70.8 ml LV vol d, MOD A4C: 72.6 ml LV vol s, MOD A2C: 23.3 ml LV vol s, MOD A4C: 23.4 ml LV SV MOD A2C:     47.5 ml LV SV MOD A4C:     72.6 ml LV SV MOD BP:      48.7 ml RIGHT VENTRICLE RV S prime:     11.30 cm/s TAPSE (M-mode): 1.1 cm LEFT ATRIUM             Index LA diam:        4.90 cm 2.97  cm/m LA Vol (A2C):   85.9 ml 51.98 ml/m LA Vol (A4C):   61.2 ml 37.03 ml/m LA Biplane Vol: 72.4 ml 43.81 ml/m  AORTIC VALVE AV Area (Vmax):    0.68 cm AV Area (Vmean):   0.65 cm AV Area (VTI):     0.69 cm AV Vmax:           335.00 cm/s AV Vmean:          213.000 cm/s AV VTI:            0.581 m AV Peak Grad:      44.9 mmHg AV Mean Grad:      22.0 mmHg LVOT Vmax:         148.00 cm/s LVOT Vmean:        89.300 cm/s LVOT VTI:          0.262 m LVOT/AV VTI ratio: 0.45 AI PHT:            403 msec MITRAL VALVE                TRICUSPID VALVE MV Area (PHT): 4.00 cm     TR Peak grad:   53.6 mmHg MV Area VTI:   1.27 cm     TR Vmax:        366.00 cm/s MV Peak grad:  9.5 mmHg MV Mean grad:  4.0 mmHg     SHUNTS MV Vmax:       1.54 m/s     Systemic VTI:  0.26 m MV Vmean:      89.4 cm/s    Systemic Diam: 1.40 cm MR Peak grad: 143.5 mmHg MR Mean grad: 92.0 mmHg MR Vmax:      599.00 cm/s MR Vmean:     451.0 cm/s MV E velocity: 154.00 cm/s MV A velocity: 60.90 cm/s MV  E/A ratio:  2.53 Toribio Fuel MD Electronically signed by Toribio Fuel MD Signature Date/Time: 07/11/2023/5:24:47 PM    Final    DG Chest Port 1 View Result Date: 07/10/2023 CLINICAL DATA:  sob EXAM: PORTABLE CHEST - 1 VIEW COMPARISON:  04/06/2022 FINDINGS: Worsening perihilar and basilar infiltrates or edema, left greater than right. Heart size and mediastinal contours are within normal limits. Aortic Atherosclerosis (ICD10-170.0). No effusion. Visualized bones unremarkable. IMPRESSION: Worsening perihilar and basilar infiltrates or edema. Electronically Signed   By: JONETTA Faes M.D.   On: 07/10/2023 10:05    Assessment/Plan    Acute exacerbation of congestive heart failure (CHF) Worsening dyspnea with recent hospitalization for acute CHF exacerbation. Echocardiogram showed severe concentric LVH, SAM, hypertrophic cardiomyopathy, moderate to severe aortic stenosis, and moderate aortic regurgitation. Discussed fluid overload management and potential  increased diuresis. Declined fluid removal via tube insertion. - Order CBC, BMP, and BNP today. - start Lasix  20 mg daily, hold if systolic BP <110 mmHg. - continue Lasix  20 mg daily PRN for edema. - Follow up in one week to reassess symptoms and lab results. -  Daughter is wanting to have hospice consult if patient does not improve in a week  Chronic kidney disease (CKD) stage 3a CKD stage 3a with recent creatinine of 1.09 mg/dL. Monitoring kidney function due to diuretics and CHF management. - Monitor renal function .  Hyponatremia Sodium levels between 130-135 mmol/L, monitored due to diuretic use. - Monitor sodium levels   Hyperkalemia Elevated potassium improved to 4.8 mmol/L on discharge. Monitored due to medication and fluid management. - Monitor potassium levels with BMP .  Hypothyroidism Managed with levothyroxine  200 mcg daily.  Goals of Care DNR status. Discussed comfort and potential need for more aggressive interventions if symptoms do not improve with increased diuresis. Her daughter desires comfort and avoidance of invasive procedures unless necessary.  Follow-up Scheduled cardiology appointment on August 1st. Emphasized importance of follow-up to reassess symptoms, lab results, and determine need for further interventions. - Follow up in one week for reassessment and lab results. - Ensure cardiology follow-up on August 1st.     Labs/tests ordered:   BNP, BMP, CBC   Return in about 1 week (around 08/01/2023).  Emunah Texidor Medina-Vargas, NP

## 2023-07-26 LAB — BASIC METABOLIC PANEL WITH GFR
BUN/Creatinine Ratio: 38 (calc) — ABNORMAL HIGH (ref 6–22)
BUN: 36 mg/dL — ABNORMAL HIGH (ref 7–25)
CO2: 28 mmol/L (ref 20–32)
Calcium: 10.3 mg/dL (ref 8.6–10.4)
Chloride: 107 mmol/L (ref 98–110)
Creat: 0.94 mg/dL (ref 0.60–0.95)
Glucose, Bld: 102 mg/dL — ABNORMAL HIGH (ref 65–99)
Potassium: 4.3 mmol/L (ref 3.5–5.3)
Sodium: 142 mmol/L (ref 135–146)
eGFR: 57 mL/min/1.73m2 — ABNORMAL LOW (ref >=60)

## 2023-07-26 LAB — CBC WITH DIFFERENTIAL/PLATELET
Absolute Lymphocytes: 1028 {cells}/uL (ref 850–3900)
Absolute Monocytes: 1083 {cells}/uL — ABNORMAL HIGH (ref 200–950)
Basophils Absolute: 21 {cells}/uL (ref 0–200)
Basophils Relative: 0.3 %
Eosinophils Absolute: 28 {cells}/uL (ref 15–500)
Eosinophils Relative: 0.4 %
HCT: 36.4 % (ref 35.0–45.0)
Hemoglobin: 11.8 g/dL (ref 11.7–15.5)
MCH: 29.7 pg (ref 27.0–33.0)
MCHC: 32.4 g/dL (ref 32.0–36.0)
MCV: 91.7 fL (ref 80.0–100.0)
MPV: 11.7 fL (ref 7.5–12.5)
Monocytes Relative: 15.7 %
Neutro Abs: 4740 {cells}/uL (ref 1500–7800)
Neutrophils Relative %: 68.7 %
Platelets: 126 Thousand/uL — ABNORMAL LOW (ref 140–400)
RBC: 3.97 Million/uL (ref 3.80–5.10)
RDW: 12.8 % (ref 11.0–15.0)
Total Lymphocyte: 14.9 %
WBC: 6.9 Thousand/uL (ref 3.8–10.8)

## 2023-07-26 LAB — BRAIN NATRIURETIC PEPTIDE: Brain Natriuretic Peptide: 1416 pg/mL — ABNORMAL HIGH (ref <100)

## 2023-07-29 ENCOUNTER — Telehealth: Payer: Self-pay

## 2023-07-29 NOTE — Telephone Encounter (Signed)
 Received paperwork from Darice Moats, front desk patient care advocate. FMLA   Form placed in Monina Medina-Vargas, NP review and sign folder (seen patient on 07/25/23)

## 2023-07-30 ENCOUNTER — Other Ambulatory Visit: Payer: Self-pay | Admitting: Adult Health

## 2023-07-30 ENCOUNTER — Ambulatory Visit: Payer: Self-pay | Admitting: Adult Health

## 2023-07-30 DIAGNOSIS — I5033 Acute on chronic diastolic (congestive) heart failure: Secondary | ICD-10-CM

## 2023-07-30 NOTE — Telephone Encounter (Signed)
 FMLA completed and given to Ozell, front desk. Daughter requested for hospice consult and have entered the referral.

## 2023-07-30 NOTE — Progress Notes (Signed)
-    BNP elevated, continue current Lasix  orders, noted to have a follow up on 07/31/23 -  GFR 57, stable, ranging as CKD stage 3a -  no anemia Platelets 126, up from 91 - K 4.3, normal

## 2023-07-31 ENCOUNTER — Encounter: Payer: Self-pay | Admitting: Sports Medicine

## 2023-07-31 ENCOUNTER — Ambulatory Visit: Admitting: Sports Medicine

## 2023-07-31 VITALS — BP 118/60 | HR 67 | Temp 97.5°F | Ht 62.0 in

## 2023-07-31 DIAGNOSIS — I5033 Acute on chronic diastolic (congestive) heart failure: Secondary | ICD-10-CM

## 2023-07-31 MED ORDER — FUROSEMIDE 40 MG PO TABS: 40.0000 mg | ORAL_TABLET | Freq: Two times a day (BID) | ORAL | 0 refills | Status: DC

## 2023-07-31 NOTE — Progress Notes (Signed)
 Careteam: Patient Care Team: [name], NP as PCP - General (Geriatric Medicine) Michele Richardson, DO as PCP - Cardiology (Cardiology) Tobie Bamberger, OD as Consulting Physician (Optometry) Elihue Dempsey BRAVO., MD as Referring Physician (Orthopedic Surgery) Michele Richardson, DO as Referring Physician (Cardiology)  PLACE OF SERVICE:  [facility]  Advanced Directive information    No Known Allergies  No chief complaint on file.    Discussed the use of AI scribe software for clinical note transcription with the patient, who gave verbal consent to proceed.  History of Present Illness    The patient, with heart failure with reduced ejection fraction, presents with worsening breathing difficulties and fluid retention. She is accompanied by her daughter who is her primary caregiver.  Over the past week, she has experienced worsening breathing difficulties, characterized by increased shortness of breath, especially after walking approximately 35 feet, necessitating rest for about two hours. She uses a rollator for assistance and has been unable to lie flat at night due to discomfort, opting to sit up to sleep. Despite doubling her Lasix  from 20 mg to 40 mg PRN, she continues to experience significant discomfort and shortness of breath.  She has a history of heart failure and had a recent echocardiogram showed low heart function was elevated, although her kidney function remains stable. Her weight has fluctuated, with a recent decrease from 144 lbs to 139.8 lbs. She monitors her blood pressure and weight regularly, with blood pressure readings generally stable, though occasionally elevated.  No chest pain, dizziness, or lightheadedness. She reports decreased urine output, though it has improved recently. Occasional pain in her back, hips, and knees occurs with activity. Her appetite is poor, but she does not have difficulty swallowing. She denies any significant cough or chest pain.  Her  current medication regimen includes Lasix , which was recently increased to 40 mg daily.  Review of Systems:  Review of Systems  Constitutional:  Positive for malaise/fatigue. Negative for fever.  Respiratory:  Positive for shortness of breath.   Cardiovascular:  Positive for leg swelling. Negative for chest pain and palpitations.  Gastrointestinal:  Negative for heartburn, nausea and vomiting.  Genitourinary:  Negative for dysuria.  Neurological:  Negative for dizziness.   Negative unless indicated in HPI.   Past Medical History:  Diagnosis Date   Aortic stenosis    Hemorrhage of gastrointestinal tract, unspecified    Hypertension    Migraine    Mitral regurgitation    OA (osteoarthritis)    Thyroid  cyst    Past Surgical History:  Procedure Laterality Date   CATARACT EXTRACTION  1990   rt   CATARACT EXTRACTION  2005   left   LEFT HEART CATH AND CORONARY ANGIOGRAPHY N/A 10/06/2019   Procedure: LEFT HEART CATH AND CORONARY ANGIOGRAPHY;  Surgeon: Elmira Newman PARAS, MD;  Location: [facility];  Service: Cardiovascular;  Laterality: N/A;   REVISION TOTAL KNEE ARTHROPLASTY  2008   SVT ABLATION N/A 01/18/2020   Procedure: SVT ABLATION;  Surgeon: Waddell Danelle ORN, MD;  Location: [facility];  Service: Cardiovascular;  Laterality: N/A;   THUMB ARTHROSCOPY     TONSILLECTOMY     Social History:   reports that she quit smoking about 75 years ago. Her smoking use included cigarettes. She started smoking about 77 years ago. She has never used smokeless tobacco. She reports that she does not currently use alcohol. She reports that she does not use drugs.  Family History  Problem Relation  Age of Onset   CVA Father 76   Pancreatic cancer Mother    CVA Sister 54   Brain cancer Brother    Arthritis Sister    Heart disease Sister    Breast cancer Neg Hx     Medications: Patient's Medications  New Prescriptions   No medications on file  Previous Medications   CALCIUM   CARBONATE-VITAMIN D  600-400 MG-UNIT PER TABLET    Take 1 tablet by mouth. At lunch and at bedtime   DICLOFENAC  SODIUM (VOLTAREN ) 1 % GEL    Apply 4 g topically 4 (four) times daily as needed.   FUROSEMIDE  (LASIX ) 20 MG TABLET    Take 1 tablet (20 mg total) by mouth daily as needed for fluid or edema.   FUROSEMIDE  (LASIX ) 20 MG TABLET    Take 1 tablet (20 mg total) by mouth daily. Hold for SBP <110   LEVOTHYROXINE  (SYNTHROID ) 100 MCG TABLET    TAKE 1 TABLET BY MOUTH DAILY 30 MINUTES BEFORE BREAKFAST ON AN EMPTY STOMACH   LIGHT MINERAL OIL-MINERAL OIL (RETAINE MGD) 0.5-0.5 % EMUL    Place 1 drop into both eyes in the morning and at bedtime.   METOPROLOL  SUCCINATE (TOPROL -XL) 100 MG 24 HR TABLET    Take 1 tablet (100 mg total) by mouth daily. Take with or immediately following a meal.   MULTIPLE VITAMINS-MINERALS (PRESERVISION AREDS 2) CAPS    Take 1 capsule by mouth daily.   OVER THE COUNTER MEDICATION    Take 2 capsules by mouth daily. MitoQ   VITAMIN B-12 (CYANOCOBALAMIN ) 1000 MCG TABLET    Take 1 tablet (1,000 mcg total) by mouth daily.  Modified Medications   No medications on file  Discontinued Medications   No medications on file    Physical Exam: There were no vitals filed for this visit. There is no height or weight on file to calculate BMI. BP Readings from Last 3 Encounters:  07/25/23 (!) 106/50  07/17/23 118/82  07/13/23 133/62   Wt Readings from Last 3 Encounters:  07/25/23 142 lb (64.4 kg)  07/17/23 142 lb (64.4 kg)  07/13/23 142 lb 10.2 oz (64.7 kg)    Physical Exam Constitutional:      Appearance: Normal appearance.  HENT:     Head: Normocephalic and atraumatic.  Cardiovascular:     Rate and Rhythm: Normal rate and regular rhythm.     Pulses: Normal pulses.     Heart sounds: Normal heart sounds.  Pulmonary:     Effort: No respiratory distress.     Breath sounds: No stridor. Rales (basal lung fields) present. No wheezing.  Abdominal:     General: Bowel sounds  are normal. There is no distension.     Palpations: Abdomen is soft.     Tenderness: There is no abdominal tenderness. There is no guarding.  Musculoskeletal:        General: Swelling (1+ pitting odema) present.  Neurological:     Mental Status: She is alert. Mental status is at baseline.     Motor: No weakness.     Labs reviewed: Basic Metabolic Panel: Recent Labs    12/28/22 1130 05/14/23 1552 07/05/23 0938 07/10/23 1116 07/11/23 0412 07/12/23 0357 07/13/23 0512 07/17/23 1144 07/25/23 1558  NA 136 134*   < >  --  133*   < > 134* 137 142  K 4.8 4.7   < >  --  5.6*   < > 4.6 5.2 4.3  CL 104 101   < >  --  105   < > 105 105 107  CO2 28 28   < >  --  20*   < > 22 28 28   GLUCOSE 86 85   < >  --  123*   < > 100* 89 102*  BUN 16 22   < >  --  34*   < > 52* 39* 36*  CREATININE 0.80 0.72   < >  --  1.81*   < > 1.27* 1.09* 0.94  CALCIUM  9.9 9.9   < >  --  9.3   < > 9.2 10.4 10.3  MG  --   --   --  2.1  --   --   --   --   --   PHOS  --   --   --   --  3.4  --   --   --   --   TSH 2.66 1.77  --   --   --   --   --   --   --    < > = values in this interval not displayed.   Liver Function Tests: Recent Labs    12/28/22 1130 07/05/23 0938 07/10/23 1116 07/11/23 0412  AST 15 12 21   --   ALT 15 14 18   --   ALKPHOS  --   --  42  --   BILITOT 0.4 0.5 1.0  --   PROT 5.7* 5.6* 5.5*  --   ALBUMIN   --   --  3.5 3.7   No results for input(s): LIPASE, AMYLASE in the last 8760 hours. No results for input(s): AMMONIA in the last 8760 hours. CBC: Recent Labs    07/12/23 0357 07/13/23 0512 07/25/23 1558  WBC 10.3 7.3 6.9  NEUTROABS 5.9 3.6 4,740  HGB 11.0* 11.0* 11.8  HCT 34.1* 35.7* 36.4  MCV 91.2 95.7 91.7  PLT 95* 91* 126*   Lipid Panel: No results for input(s): CHOL, HDL, LDLCALC, TRIG, CHOLHDL, LDLDIRECT in the last 8760 hours. TSH: Recent Labs    12/28/22 1130 05/14/23 1552  TSH 2.66 1.77   A1C: Lab Results  Component Value Date   HGBA1C  5.1 08/22/2016    Assessment and Plan Assessment & Plan   1. Acute on chronic diastolic (congestive) heart failure (HCC) (Primary)  Pt is able to speak in full sentences Daughter reports that pt is SOB even with minimal distances On last visit daughter wants to have a hospice referral  Discussed with patient and her daughter regarding hospice referral, pt wants to wait until to see her cardiologist in 2 weeks and then decide  Increase lasix  to 40 mg twice daily  Pt brought weight log, she lost few pounds since discharge from hospital  Avoid salty foods  Monitor daily weights - furosemide  (LASIX ) 40 MG tablet; Take 1 tablet (40 mg total) by mouth in the morning and at bedtime.  Dispense: 180 tablet; Refill: 0  Instructed patient/ daughter to notify us  if they want to proceed with hospice  Monitor for chest pain, sob, dizzy or lightheadedness and if any change in mentation or clinical status go to ED

## 2023-08-16 ENCOUNTER — Ambulatory Visit: Admitting: Cardiology

## 2023-09-06 ENCOUNTER — Encounter: Admitting: Nurse Practitioner

## 2023-09-06 ENCOUNTER — Encounter: Payer: Self-pay | Admitting: Nurse Practitioner

## 2023-09-11 NOTE — Progress Notes (Signed)
 This encounter was created in error - please disregard.

## 2023-11-15 ENCOUNTER — Encounter: Payer: Medicare PPO | Admitting: Nurse Practitioner

## 2023-11-18 ENCOUNTER — Encounter: Payer: Self-pay | Admitting: Radiology

## 2023-11-20 ENCOUNTER — Telehealth: Payer: Self-pay

## 2023-11-20 NOTE — Telephone Encounter (Signed)
 Paperwork received from [employer], regarding FMLA for Amy (patients daughter to care for mother).  Paperwork placed in providers review and sign folder, once completed to be forwarded to [employer] staff to process.

## 2023-11-20 NOTE — Telephone Encounter (Signed)
I got a notification of patent passing on 12/04/2023

## 2023-12-16 DEATH — deceased

## 2023-12-27 ENCOUNTER — Ambulatory Visit: Payer: Self-pay | Admitting: Nurse Practitioner
